# Patient Record
Sex: Female | Born: 1955 | Race: White | Hispanic: Yes | Smoking: Never smoker
Health system: Southern US, Community
[De-identification: ages and names within clinical notes are randomized; demographics above are authoritative.]

## PROBLEM LIST (undated history)

## (undated) ENCOUNTER — Emergency Department (HOSPITAL_COMMUNITY): Admission: EM | Payer: No Typology Code available for payment source | Source: Home / Self Care

## (undated) DIAGNOSIS — I8390 Asymptomatic varicose veins of unspecified lower extremity: Secondary | ICD-10-CM

## (undated) DIAGNOSIS — K829 Disease of gallbladder, unspecified: Secondary | ICD-10-CM

## (undated) DIAGNOSIS — C801 Malignant (primary) neoplasm, unspecified: Secondary | ICD-10-CM

## (undated) DIAGNOSIS — M2550 Pain in unspecified joint: Secondary | ICD-10-CM

## (undated) DIAGNOSIS — R1310 Dysphagia, unspecified: Secondary | ICD-10-CM

## (undated) DIAGNOSIS — I10 Essential (primary) hypertension: Secondary | ICD-10-CM

## (undated) DIAGNOSIS — K219 Gastro-esophageal reflux disease without esophagitis: Secondary | ICD-10-CM

## (undated) DIAGNOSIS — R002 Palpitations: Secondary | ICD-10-CM

## (undated) DIAGNOSIS — E739 Lactose intolerance, unspecified: Secondary | ICD-10-CM

## (undated) DIAGNOSIS — A630 Anogenital (venereal) warts: Secondary | ICD-10-CM

## (undated) DIAGNOSIS — J4520 Mild intermittent asthma, uncomplicated: Secondary | ICD-10-CM

## (undated) DIAGNOSIS — R12 Heartburn: Secondary | ICD-10-CM

## (undated) DIAGNOSIS — M7989 Other specified soft tissue disorders: Secondary | ICD-10-CM

## (undated) DIAGNOSIS — M419 Scoliosis, unspecified: Secondary | ICD-10-CM

## (undated) DIAGNOSIS — D649 Anemia, unspecified: Secondary | ICD-10-CM

## (undated) DIAGNOSIS — M1990 Unspecified osteoarthritis, unspecified site: Secondary | ICD-10-CM

## (undated) DIAGNOSIS — L509 Urticaria, unspecified: Secondary | ICD-10-CM

## (undated) DIAGNOSIS — M549 Dorsalgia, unspecified: Secondary | ICD-10-CM

## (undated) DIAGNOSIS — Z8543 Personal history of malignant neoplasm of ovary: Secondary | ICD-10-CM

## (undated) DIAGNOSIS — E785 Hyperlipidemia, unspecified: Secondary | ICD-10-CM

## (undated) DIAGNOSIS — Z8711 Personal history of peptic ulcer disease: Secondary | ICD-10-CM

## (undated) DIAGNOSIS — Z8619 Personal history of other infectious and parasitic diseases: Secondary | ICD-10-CM

## (undated) DIAGNOSIS — G709 Myoneural disorder, unspecified: Secondary | ICD-10-CM

## (undated) DIAGNOSIS — L309 Dermatitis, unspecified: Secondary | ICD-10-CM

## (undated) DIAGNOSIS — Z780 Asymptomatic menopausal state: Secondary | ICD-10-CM

## (undated) DIAGNOSIS — K56609 Unspecified intestinal obstruction, unspecified as to partial versus complete obstruction: Secondary | ICD-10-CM

## (undated) DIAGNOSIS — G8929 Other chronic pain: Secondary | ICD-10-CM

## (undated) DIAGNOSIS — K429 Umbilical hernia without obstruction or gangrene: Secondary | ICD-10-CM

## (undated) DIAGNOSIS — G35 Multiple sclerosis: Secondary | ICD-10-CM

## (undated) DIAGNOSIS — E559 Vitamin D deficiency, unspecified: Secondary | ICD-10-CM

## (undated) DIAGNOSIS — T783XXA Angioneurotic edema, initial encounter: Secondary | ICD-10-CM

## (undated) DIAGNOSIS — K4090 Unilateral inguinal hernia, without obstruction or gangrene, not specified as recurrent: Secondary | ICD-10-CM

## (undated) HISTORY — PX: LAPAROSCOPIC LYSIS OF ADHESIONS: SHX5905

## (undated) HISTORY — DX: Angioneurotic edema, initial encounter: T78.3XXA

## (undated) HISTORY — DX: Multiple sclerosis: G35

## (undated) HISTORY — DX: Dysphagia, unspecified: R13.10

## (undated) HISTORY — DX: Personal history of other infectious and parasitic diseases: Z86.19

## (undated) HISTORY — DX: Scoliosis, unspecified: M41.9

## (undated) HISTORY — DX: Asymptomatic menopausal state: Z78.0

## (undated) HISTORY — DX: Hyperlipidemia, unspecified: E78.5

## (undated) HISTORY — DX: Other specified soft tissue disorders: M79.89

## (undated) HISTORY — DX: Anemia, unspecified: D64.9

## (undated) HISTORY — DX: Other chronic pain: G89.29

## (undated) HISTORY — DX: Asymptomatic varicose veins of unspecified lower extremity: I83.90

## (undated) HISTORY — DX: Dermatitis, unspecified: L30.9

## (undated) HISTORY — DX: Vitamin D deficiency, unspecified: E55.9

## (undated) HISTORY — DX: Unspecified intestinal obstruction, unspecified as to partial versus complete obstruction: K56.609

## (undated) HISTORY — DX: Pain in unspecified joint: M25.50

## (undated) HISTORY — PX: HERNIA REPAIR: SHX51

## (undated) HISTORY — PX: TONSILLECTOMY: SUR1361

## (undated) HISTORY — DX: Palpitations: R00.2

## (undated) HISTORY — DX: Urticaria, unspecified: L50.9

## (undated) HISTORY — DX: Anogenital (venereal) warts: A63.0

## (undated) HISTORY — DX: Mild intermittent asthma, uncomplicated: J45.20

## (undated) HISTORY — DX: Personal history of peptic ulcer disease: Z87.11

## (undated) HISTORY — DX: Disease of gallbladder, unspecified: K82.9

## (undated) HISTORY — DX: Dorsalgia, unspecified: M54.9

## (undated) HISTORY — PX: SINOSCOPY: SHX187

## (undated) HISTORY — PX: VARICOSE VEIN SURGERY: SHX832

## (undated) HISTORY — DX: Personal history of malignant neoplasm of ovary: Z85.43

## (undated) HISTORY — PX: HYSTERECTOMY ABDOMINAL WITH SALPINGO-OOPHORECTOMY: SHX6792

## (undated) HISTORY — DX: Lactose intolerance, unspecified: E73.9

## (undated) HISTORY — DX: Unspecified osteoarthritis, unspecified site: M19.90

## (undated) HISTORY — PX: TYMPANOSTOMY TUBE PLACEMENT: SHX32

## (undated) HISTORY — PX: MOHS SURGERY: SUR867

## (undated) HISTORY — DX: Malignant (primary) neoplasm, unspecified: C80.1

## (undated) HISTORY — DX: Heartburn: R12

## (undated) HISTORY — PX: ADENOIDECTOMY: SUR15

## (undated) HISTORY — DX: Essential (primary) hypertension: I10

---

## 2006-01-05 HISTORY — PX: HYSTEROTOMY: SHX1776

## 2007-01-06 HISTORY — PX: CHOLECYSTECTOMY: SHX55

## 2007-10-06 DIAGNOSIS — B999 Unspecified infectious disease: Secondary | ICD-10-CM

## 2007-10-06 HISTORY — DX: Unspecified infectious disease: B99.9

## 2013-05-10 LAB — HM MAMMOGRAPHY

## 2017-05-11 DIAGNOSIS — K639 Disease of intestine, unspecified: Secondary | ICD-10-CM | POA: Insufficient documentation

## 2017-06-29 DIAGNOSIS — E785 Hyperlipidemia, unspecified: Secondary | ICD-10-CM

## 2017-06-29 DIAGNOSIS — C801 Malignant (primary) neoplasm, unspecified: Secondary | ICD-10-CM

## 2017-06-29 DIAGNOSIS — I10 Essential (primary) hypertension: Secondary | ICD-10-CM | POA: Insufficient documentation

## 2017-06-29 DIAGNOSIS — E663 Overweight: Secondary | ICD-10-CM | POA: Insufficient documentation

## 2017-06-29 DIAGNOSIS — I8390 Asymptomatic varicose veins of unspecified lower extremity: Secondary | ICD-10-CM

## 2017-06-29 DIAGNOSIS — M419 Scoliosis, unspecified: Secondary | ICD-10-CM

## 2017-06-29 DIAGNOSIS — G35 Multiple sclerosis: Secondary | ICD-10-CM | POA: Insufficient documentation

## 2017-06-29 DIAGNOSIS — Z8669 Personal history of other diseases of the nervous system and sense organs: Secondary | ICD-10-CM | POA: Insufficient documentation

## 2017-06-29 DIAGNOSIS — R918 Other nonspecific abnormal finding of lung field: Secondary | ICD-10-CM | POA: Insufficient documentation

## 2017-06-29 DIAGNOSIS — K56609 Unspecified intestinal obstruction, unspecified as to partial versus complete obstruction: Secondary | ICD-10-CM

## 2017-06-29 DIAGNOSIS — Z780 Asymptomatic menopausal state: Secondary | ICD-10-CM

## 2017-06-29 DIAGNOSIS — E559 Vitamin D deficiency, unspecified: Secondary | ICD-10-CM

## 2017-06-29 DIAGNOSIS — Z8619 Personal history of other infectious and parasitic diseases: Secondary | ICD-10-CM | POA: Insufficient documentation

## 2017-06-29 HISTORY — DX: Asymptomatic menopausal state: Z78.0

## 2017-06-29 HISTORY — DX: Asymptomatic varicose veins of unspecified lower extremity: I83.90

## 2017-06-29 HISTORY — DX: Unspecified intestinal obstruction, unspecified as to partial versus complete obstruction: K56.609

## 2017-06-29 HISTORY — DX: Malignant (primary) neoplasm, unspecified: C80.1

## 2017-06-29 HISTORY — DX: Hyperlipidemia, unspecified: E78.5

## 2017-06-29 HISTORY — DX: Multiple sclerosis: G35

## 2017-06-29 HISTORY — DX: Essential (primary) hypertension: I10

## 2017-06-29 HISTORY — DX: Scoliosis, unspecified: M41.9

## 2017-06-29 HISTORY — DX: Personal history of other infectious and parasitic diseases: Z86.19

## 2017-06-29 HISTORY — DX: Vitamin D deficiency, unspecified: E55.9

## 2017-09-22 ENCOUNTER — Ambulatory Visit: Payer: Self-pay | Admitting: Allergy & Immunology

## 2017-10-18 ENCOUNTER — Telehealth: Payer: Self-pay | Admitting: Allergy & Immunology

## 2017-11-02 ENCOUNTER — Ambulatory Visit (INDEPENDENT_AMBULATORY_CARE_PROVIDER_SITE_OTHER): Payer: No Typology Code available for payment source | Admitting: Allergy & Immunology

## 2017-11-02 ENCOUNTER — Encounter: Payer: Self-pay | Admitting: Allergy & Immunology

## 2017-11-02 VITALS — BP 118/72 | HR 70 | Temp 98.1°F | Resp 18 | Ht 64.5 in | Wt 176.8 lb

## 2017-11-02 DIAGNOSIS — J4521 Mild intermittent asthma with (acute) exacerbation: Secondary | ICD-10-CM | POA: Diagnosis not present

## 2017-11-02 DIAGNOSIS — J3089 Other allergic rhinitis: Secondary | ICD-10-CM

## 2017-11-02 DIAGNOSIS — J4520 Mild intermittent asthma, uncomplicated: Secondary | ICD-10-CM | POA: Insufficient documentation

## 2017-11-02 DIAGNOSIS — J302 Other seasonal allergic rhinitis: Secondary | ICD-10-CM | POA: Insufficient documentation

## 2017-11-02 HISTORY — DX: Mild intermittent asthma, uncomplicated: J45.20

## 2017-11-02 NOTE — Patient Instructions (Addendum)
1. Seasonal and perennial allergic rhinitis - We will get records from your previous doctor so that we can put all of the information together. - We will get some blood work to see what allergies are evident at this point to give [place] more information. - In the meantime, continue with your nasal saline rinses as needed.  - Consent for allergen immunotherapy provided (I anticipate that we would not have to start from the beginning since you have been on shots for a long period of time).  2. Intermittent asthma - Lung testing looked slightly low and you were wheezing on the right side. - It did not get better with the nebulizer treatment, unfortunately.  - I would recommend using albuterol four puffs every 4-6 hours for the next week or so while you are moving. - Start the prednisone pack if you are not feeling better by the weekend.   3. Return in about 3 months (around 02/02/2018).   Please inform us of any Emergency Department visits, hospitalizations, or changes in symptoms. Call us before going to the ED for breathing or allergy symptoms since we might be able to fit you in for a sick visit. Feel free to contact us anytime with any questions, problems, or concerns.  It was a pleasure to meet you today!  Websites that have reliable patient information: 1. American Academy of Asthma, Allergy, and Immunology: www.aaaai.org 2. Food Allergy Research and Education (FARE): foodallergy.org 3. Mothers of Asthmatics: http://www.asthmacommunitynetwork.org 4. American College of Allergy, Asthma, and Immunology: MonthlyElectricBill.co.uk   Make sure you are registered to vote! If you have moved or changed any of your contact information, you will need to get this updated before voting!

## 2017-11-02 NOTE — Progress Notes (Signed)
NEW PATIENT  Date of Service/Encounter:  11/02/17  Referring provider: Karlyn Agee PA   Assessment:   Mild intermittent asthma with acute exacerbation  Seasonal and perennial allergic rhinitis - on allergen immunotherapy from an ENT in [place]  Complicated past medical history, including multiple abdominal surgeries and multiple sclerosis  Plan/Recommendations:   1. Seasonal and perennial allergic rhinitis - We will get records from your previous doctor so that we can put all of the information together. - We will get some blood work to see what allergies are evident at this point to give [place] more information. - In the meantime, continue with your nasal saline rinses as needed.  - Consent for allergen immunotherapy provided (I anticipate that we would not have to start from the beginning since you have been on shots for a long period of time).  2. Intermittent asthma with acute exacerbation likely second to dust exposure during the move - Lung testing looked slightly low and you were wheezing on the right side. - It did not get better with the nebulizer treatment, unfortunately.  - I would recommend using albuterol four puffs every 4-6 hours for the next week or so while you are moving. - Start the prednisone pack if you are not feeling better by the weekend.   3. Return in about 3 months (around 02/02/2018).   Subjective:   Emily Wolfe is a 62 y.o. female presenting today for evaluation of  Chief Complaint  Patient presents with  . [place] has a history of the following: Patient Active Problem List   Diagnosis Date Noted  . Seasonal and perennial allergic rhinitis 11/02/2017  . Mild intermittent asthma without complication 26/37/8588  . History of disorder of central nervous system 06/29/2017  . Hyperlipidemia 06/29/2017  . Hypertension 06/29/2017  . Multiple sclerosis ([place]) 06/29/2017  . Scoliosis of lumbar spine  06/29/2017  . 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 Vitamin D deficiency 06/29/2017  . Colon abnormality 05/11/2017    History obtained from: chart review and patient.  Emily Wolfe was referred by Patient, No Pcp Per.     Emily Wolfe is a 62 y.o. female presenting for establishment of care. She was previously seen by Dr. Gonzella Lex (now Dr. Alain Marion apparently) at [facility]. She was initially tested a "while" ago. She has been doing shots for at least three years, every week at this point. She does not have her vials with her at this time since she was told that she needed something from our office. Her last injection was this past Thursday. She was positive to "pretty much everything". She was getting two injections. She knows that there were grasses, trees, and cats.   She has never been diagnosed with asthma. But she tells me that she has been worked up for asthma since her congestion continued despite all of her medications. She does have an albuterol inhaler "somewhere" but it seems that it was lost in the move.   She is currently has nasal saline to use as needed. She "has so many other illnesses" that this just falls off. She has had 12 abdominal surgeries to adhesions and internal hernias and whatnot. She also has MS and is seen at [facility]. She has a history of skin cancer and had it removed from the left side of her nose.   Otherwise, there is no history of other atopic diseases, including food allergies, drug allergies, stinging insect allergies or  urticaria. There is no significant infectious history.Vaccinations are up to date.    Past Medical History: Patient Active Problem List   Diagnosis Date Noted  . Seasonal and perennial allergic rhinitis 11/02/2017  . Mild intermittent asthma without complication 23/53/6144  . History of disorder of central nervous system 06/29/2017  . Hyperlipidemia 06/29/2017  . Hypertension 06/29/2017  . Multiple  sclerosis ([place]) 06/29/2017  . Scoliosis of lumbar spine 06/29/2017  . 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 Vitamin D deficiency 06/29/2017  . Colon abnormality 05/11/2017    Medication List:  Allergies as of 11/02/2017   Not on File     Medication List        Accurate as of 11/02/17 11:34 PM. Always use your most recent med list.          GABAPENTIN (ONCE-DAILY) PO Take 1 tablet by mouth 3 (three) times daily.   GILENYA PO Take 1 tablet by mouth daily.   losartan 25 MG tablet Commonly known as:  COZAAR Take 25 mg by mouth daily.       Birth History: non-contributory  Developmental History: non-contributory.   Past Surgical History: History reviewed. No pertinent surgical history.   Family History: History reviewed. No pertinent family history.   Social History: Emily Wolfe lives at home in a house that is 62 years old. There is vinyl flooring throughout the home. She has gas heating and central cooling. There are dogs inside of the home. There are dust mite coverings on the bedding. There is no tobacco exposure in the home.    Review of Systems: a 14-point review of systems is pertinent for what is mentioned in HPI.  Otherwise, all other systems were negative. Constitutional: negative other than that listed in the HPI Eyes: negative other than that listed in the HPI Ears, nose, mouth, throat, and face: negative other than that listed in the HPI Respiratory: negative other than that listed in the HPI Cardiovascular: negative other than that listed in the HPI Gastrointestinal: negative other than that listed in the HPI Genitourinary: negative other than that listed in the HPI Integument: negative other than that listed in the HPI Hematologic: negative other than that listed in the HPI Musculoskeletal: negative other than that listed in the HPI Neurological: negative other than that listed in the HPI Allergy/Immunologic: negative other than that listed  in the HPI    Objective:   Blood pressure 118/72, pulse 70, temperature 98.1 F (36.7 C), temperature source Oral, resp. rate 18, height 5' 4.5" (1.638 m), weight 176 lb 12.8 oz (80.2 kg), SpO2 97 %. Body mass index is 29.88 kg/m.   Physical Exam:  General: Alert, interactive, in no acute distress. Somewhat dejected in nature.  Eyes: No conjunctival injection bilaterally, no discharge on the right, no discharge on the left and no Horner-Trantas dots present. PERRL bilaterally. EOMI without pain. No photophobia.  Ears: Right TM pearly gray with normal light reflex, Left TM pearly gray with normal light reflex, Right TM intact without perforation and Left TM intact without perforation.  Nose/Throat: External nose within normal limits and septum midline. Turbinates edematous and pale with clear discharge. Posterior oropharynx mildly erythematous without cobblestoning in the posterior oropharynx. Tonsils 2+ without exudates.  Tongue without thrush. Neck: Supple without thyromegaly. Trachea midline. Adenopathy: no enlarged lymph nodes appreciated in the anterior cervical, occipital, axillary, epitrochlear, inguinal, or popliteal regions. Lungs: Clear to auscultation without wheezing, rhonchi or rales. No increased work of breathing. CV: Normal S1/S2.  No murmurs. Capillary refill <2 seconds.  Abdomen: Nondistended, nontender. No guarding or rebound tenderness. Bowel sounds present in all fields and hyperactive  Skin: Warm and dry, without lesions or rashes. Extremities:  No clubbing, cyanosis or edema. Neuro:   Grossly intact. No focal deficits appreciated. Responsive to questions.  Diagnostic studies:   Spirometry: results abnormal (FEV1: 1.66/64%, FVC: 2.27/67%, FEV1/FVC: 73%).    Spirometry consistent with possible restrictive disease. Albuterol nebulizer treatment given in clinic with no improvement. Overall there was poor effort.   Allergy Studies: none (obtained blood work  instead)       Salvatore Marvel, MD [facility] and [place] [facility] [place]

## 2017-11-05 LAB — IGE+ALLERGENS ZONE 2(30)
Alternaria Alternata IgE: <0.1 kU/L
Amer Sycamore IgE Qn: <0.1 kU/L
Bahia Grass IgE: <0.1 kU/L
Bermuda Grass IgE: <0.1 kU/L
Cedar, Mountain IgE: <0.1 kU/L
Cladosporium Herbarum IgE: <0.1 kU/L
Cockroach, American IgE: <0.1 kU/L
Common Silver Birch IgE: <0.1 kU/L
D Farinae IgE: <0.1 kU/L
D Pteronyssinus IgE: <0.1 kU/L
Elm, American IgE: <0.1 kU/L
IgE (Immunoglobulin E), Serum: 5 IU/mL — ABNORMAL LOW (ref 6–495)
Johnson Grass IgE: <0.1 kU/L
Mugwort IgE Qn: <0.1 kU/L
Nettle IgE: <0.1 kU/L
Oak, White IgE: <0.1 kU/L
Plantain, English IgE: <0.1 kU/L
Ragweed, Short IgE: <0.1 kU/L
Stemphylium Herbarum IgE: <0.1 kU/L
Timothy Grass IgE: <0.1 kU/L

## 2017-11-10 ENCOUNTER — Telehealth: Payer: Self-pay | Admitting: Allergy & Immunology

## 2017-11-10 NOTE — Telephone Encounter (Signed)
Pt called and wanted know if we have heard from her dr about her vials. [phone]/

## 2017-11-10 NOTE — Telephone Encounter (Signed)
Dr. Ernst Bowler do you know if you have received any information pertaining to her vials?

## 2017-11-10 NOTE — Telephone Encounter (Signed)
I have not seen anything come across my desk. Can you check on inbox in my office?   Salvatore Marvel, MD All[facility] and [place] [facility] [place]

## 2017-11-10 NOTE — Telephone Encounter (Signed)
Called the office at [facility] and they stated they never received the Medical Records release form. Form has been faxed again to the office. Will call tomorrow to follow up with the office to ensure that they received the form. Dr. Ernst Bowler is aware.

## 2017-11-11 NOTE — Telephone Encounter (Signed)
Called patient and left a detailed message explaining that we are waiting for the physician to sign off on the medical release form.

## 2017-11-11 NOTE — Telephone Encounter (Signed)
Called and spoke with the office and they confirmed that they did receive the medical release form, they are just waiting on the physician to sign the orders to release the medical information.

## 2017-11-11 NOTE — Telephone Encounter (Signed)
Patient called and informed that she was going to that office today to receive her allergy injection and asked if it was ok if she requested to pick up her vials and bring them to our office so that she can continue her injections with [place]. Informed patient that as long as the office will let her take her vials out she can certainly bring them to our office.

## 2017-11-17 NOTE — Telephone Encounter (Signed)
Heather have you seen these yet(vials)?

## 2017-11-17 NOTE — Telephone Encounter (Signed)
Patient dropped vials off last week. They are in the shot room. Paperwork filed in the out of office vial book in the injection room.

## 2017-11-19 DIAGNOSIS — A630 Anogenital (venereal) warts: Secondary | ICD-10-CM

## 2017-11-19 DIAGNOSIS — Z8543 Personal history of malignant neoplasm of ovary: Secondary | ICD-10-CM

## 2017-11-19 HISTORY — DX: Personal history of malignant neoplasm of ovary: Z85.43

## 2017-11-19 HISTORY — DX: Anogenital (venereal) warts: A63.0

## 2017-11-19 LAB — HM PAP SMEAR: HM Pap smear: NEGATIVE

## 2017-11-19 NOTE — Progress Notes (Addendum)
We received the outside records for Emily Wolfe previous practice. It was an ENT practice and it looks like they performed skin endpoint titration testing, which unfortunately has terrible predictive value. Therefore I cannot use those results to make new vials in good faith. I believe we did blood testing for her which was negative. We need to bring her in for percutaneous skin testing to get some valid results.   In the meantime, she can continue to receive the injections of the vials that we have, unless she would rather just stop. We will have to restart our new vials (based on her skin testing) from the beginning, although she can go on a faster course (schedule B).   I did call her this morning to discuss this. She understands that she will need repeat skin testing. She scheduled with me in [place] on November 22nd at 1:30pm for skin testing only. Vance Gather put her into the appointment slot.   Salvatore Marvel, MD All[facility] and [place] [facility] [place]

## 2017-11-26 ENCOUNTER — Encounter: Payer: Self-pay | Admitting: Allergy & Immunology

## 2017-11-26 ENCOUNTER — Ambulatory Visit: Payer: No Typology Code available for payment source | Admitting: Allergy & Immunology

## 2017-11-26 VITALS — BP 138/76 | HR 70 | Temp 98.7°F | Resp 18

## 2017-11-26 DIAGNOSIS — J3089 Other allergic rhinitis: Secondary | ICD-10-CM | POA: Diagnosis not present

## 2017-11-26 DIAGNOSIS — J302 Other seasonal allergic rhinitis: Secondary | ICD-10-CM | POA: Diagnosis not present

## 2017-11-26 NOTE — Progress Notes (Signed)
FOLLOW UP  Date of Service/Encounter:  11/26/17   Assessment:   Seasonal and perennial allergic rhinitis (ragweed, grasses, indoor molds and outdoor molds)  Plan/Recommendations:   1. Seasonal and perennial allergic rhinitis - Testing today showed: ragweed, grasses, indoor molds and outdoor molds - Copy of test results provided.  - Avoidance measures provided. - Continue with: nasal saline rinses 1-2 times daily  - Allergy shot consent signed at the last visit.  - Make an appointment in two weeks to start allergy shots.   2. Shortness of breath - improved - We did not do lung testing today. - I am glad that your symptoms have improved since the last visit. - Good luck with all of the unpacking and putting away of items!   3. Return in about 3 months (around 02/26/2018).  Subjective:   Emily Wolfe is a 62 y.o. female presenting today for follow up of  Chief Complaint  Patient presents with  . Allergy Testing    Emily Wolfe has a history of the following: Patient Active Problem List   Diagnosis Date Noted  . Seasonal and perennial allergic rhinitis 11/02/2017  . Mild intermittent asthma without complication 72/62/0355  . History of disorder of central nervous system 06/29/2017  . Hyperlipidemia 06/29/2017  . Hypertension 06/29/2017  . Multiple sclerosis ([place]) 06/29/2017  . Scoliosis of lumbar spine 06/29/2017  . 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 Vitamin D deficiency 06/29/2017  . Colon abnormality 05/11/2017    History obtained from: chart review and patient.  [place] Primary Care Provider is Patient, No Pcp Per.     Emily Wolfe is a 62 y.o. female presenting for a skin testing. She was last seen at the end of last month for her first appointment. At that time, she was establishing care with [place] following her move from [place], [place]. She was interested in starting her allergen immunotherapy at the time and we reviewed her outside  records. It turns out that she underwent end dilution testing to determine her allergies, which is not an approved mode of determination of allergies. We did send blood testing which was completely negative.   Since the last visit, she has done well. She does feel that her allergies are out of control since being off of the allergy shots. She is very much looking forward to getting started with [place].  Otherwise, there have been no changes to her past medical history, surgical history, family history, or social history.    Review of Systems: a 14-point review of systems is pertinent for what is mentioned in HPI.  Otherwise, all other systems were negative.  Constitutional: negative other than that listed in the HPI Eyes: negative other than that listed in the HPI Ears, nose, mouth, throat, and face: negative other than that listed in the HPI Respiratory: negative other than that listed in the HPI Cardiovascular: negative other than that listed in the HPI Gastrointestinal: negative other than that listed in the HPI Genitourinary: negative other than that listed in the HPI Integument: negative other than that listed in the HPI Hematologic: negative other than that listed in the HPI Musculoskeletal: negative other than that listed in the HPI Neurological: negative other than that listed in the HPI Allergy/Immunologic: negative other than that listed in the HPI    Objective:   Blood pressure 138/76, pulse 70, temperature 98.7 F (37.1 C), temperature source Oral, resp. rate 18. There is no height or weight on file to calculate BMI.  Physical Exam: deferred since this was skin testing appointment only   Diagnostic studies:    Allergy Studies:   Airborne Adult Perc - 2017/12/20 1421    Time Antigen Placed  0200    Allergen Manufacturer  Lavella Hammock    Location  Back    Number of Test  59    Panel 1  Select    1. Control-Buffer 50% Glycerol  Negative    2. Control-Histamine 1 mg/ml  2+     3. Albumin saline  Negative    4. [place]  Negative    5. [place]  Negative    6. Johnson  Negative    7. [place] Blue  Negative    8. Meadow Fescue  Negative    9. Perennial Rye  Negative    10. Sweet Vernal  Negative    11. Timothy  Negative    12. Cocklebur  Negative    13. Burweed Marshelder  Negative    14. Ragweed, short  4+    15. Ragweed, Giant  Negative    16. Plantain,  English  Negative    17. Lamb's Quarters  Negative    18. Sheep Sorrell  Negative    19. Rough Pigweed  Negative    20. Marsh Elder, Rough  Negative    21. Mugwort, Common  Negative    22. Ash mix  Negative    23. Birch mix  Negative    24. Beech American  Negative    25. Box, Elder  Negative    26. Cedar, red  Negative    27. Cottonwood, [place]  Negative    28. Elm mix  Negative    29. Hickory mix  Negative    30. Maple mix  Negative    31. Oak, [place] mix  Negative    32. Pecan Pollen  Negative    33. Pine mix  Negative    34. Sycamore Eastern  Negative    35. [place], Black Pollen  Negative    36. Alternaria alternata  Negative    37. Cladosporium Herbarum  Negative    38. Aspergillus mix  Negative    39. Penicillium mix  Negative    40. Bipolaris sorokiniana (Helminthosporium)  Negative    41. Drechslera spicifera (Curvularia)  Negative    42. Mucor plumbeus  Negative    43. Fusarium moniliforme  Negative    44. Aureobasidium pullulans (pullulara)  Negative    45. Rhizopus oryzae  Negative    46. Botrytis cinera  Negative    47. Epicoccum nigrum  Negative    48. Phoma betae  Negative    49. Candida Albicans  Negative    50. Trichophyton mentagrophytes  Negative    51. Mite, D Farinae  5,000 AU/ml  Negative    52. Mite, D Pteronyssinus  5,000 AU/ml  Negative    53. Cat Hair 10,000 BAU/ml  Negative    54.  Dog Epithelia  Negative    55. Mixed Feathers  Negative    56. Horse Epithelia  Negative    57. Cockroach, German  Negative    58. Mouse  Negative    59. Tobacco Leaf  Negative      Intradermal - 2017-12-20 1445    Time Antigen Placed  0240    Allergen Manufacturer  Lavella Hammock    Location  Arm    Number of Test  14    Intradermal  Select    Control  Negative  Guatemala  Negative    Johnson  Negative    7 Grass  1+    Weed mix  Negative    Tree mix  Negative    Mold 1  Negative    Mold 2  2+    Mold 3  1+    Mold 4  Negative    Cat  Negative    Dog  Negative    Cockroach  Negative    Mite mix  Negative        Allergy testing results were read and interpreted by myself, documented by clinical staff.      Salvatore Marvel, MD  [facility] and [place] [facility] [place]

## 2017-11-26 NOTE — Patient Instructions (Addendum)
1. Seasonal and perennial allergic rhinitis - Testing today showed: ragweed, grasses, indoor molds and outdoor molds - Copy of test results provided.  - Avoidance measures provided. - Continue with: nasal saline rinses 1-2 times daily  - Allergy shot consent signed at the last visit.  - Make an appointment in two weeks to start allergy shots.   2. Shortness of breath - improved - We did not do lung testing today. - I am glad that your symptoms have improved since the last visit. - Good luck with all of the unpacking and putting away of items!   3. Return in about 3 months (around 02/26/2018).   Please inform us of any Emergency Department visits, hospitalizations, or changes in symptoms. Call us before going to the ED for breathing or allergy symptoms since we might be able to fit you in for a sick visit. Feel free to contact us anytime with any questions, problems, or concerns.  It was a pleasure to see you again today!  Websites that have reliable patient information: 1. American Academy of Asthma, Allergy, and Immunology: www.aaaai.org 2. Food Allergy Research and Education (FARE): foodallergy.org 3. Mothers of Asthmatics: http://www.asthmacommunitynetwork.org 4. American College of Allergy, Asthma, and Immunology: MonthlyElectricBill.co.uk   Make sure you are registered to vote! If you have moved or changed any of your contact information, you will need to get this updated before voting!    Reducing Pollen Exposure  The American Academy of Allergy, Asthma and Immunology suggests the following steps to reduce your exposure to pollen during allergy seasons.    1. Do not hang sheets or clothing out to dry; pollen may collect on these items. 2. Do not mow lawns or spend time around freshly cut grass; mowing stirs up pollen. 3. Keep windows closed at night.  Keep car windows closed while driving. 4. Minimize morning activities outdoors, a time when pollen counts are usually at their  highest. 5. Stay indoors as much as possible when pollen counts or humidity is high and on windy days when pollen tends to remain in the air longer. 6. Use air conditioning when possible.  Many air conditioners have filters that trap the pollen spores. 7. Use a HEPA room air filter to remove pollen form the indoor air you breathe.  Control of Mold Allergen   Mold and fungi can grow on a variety of surfaces provided certain temperature and moisture conditions exist.  Outdoor molds grow on plants, decaying vegetation and soil.  The major outdoor mold, Alternaria and Cladosporium, are found in very high numbers during hot and dry conditions.  Generally, a late Summer - Fall peak is seen for common outdoor fungal spores.  Rain will temporarily lower outdoor mold spore count, but counts rise rapidly when the rainy period ends.  The most important indoor molds are Aspergillus and Penicillium.  Dark, humid and poorly ventilated basements are ideal sites for mold growth.  The next most common sites of mold growth are the bathroom and the kitchen.  Outdoor (Seasonal) Mold Control  Positive outdoor molds via skin testing: Bipolaris (Helminthsporium), Drechslera (Curvalaria) and Mucor  1. Use air conditioning and keep windows closed 2. Avoid exposure to decaying vegetation. 3. Avoid leaf raking. 4. Avoid grain handling. 5. Consider wearing a face mask if working in moldy areas.  6.   Indoor (Perennial) Mold Control   Positive indoor molds via skin testing: Aspergillus and Penicillium  1. Maintain humidity below 50%. 2. Clean washable surfaces with 5% bleach  solution. 3. Remove sources e.g. contaminated carpets.    Allergy Shots   Allergies are the result of a chain reaction that starts in the immune system. Your immune system controls how your body defends itself. For instance, if you have an allergy to pollen, your immune system identifies pollen as an invader or allergen. Your immune system  overreacts by producing antibodies called Immunoglobulin E (IgE). These antibodies travel to cells that release chemicals, causing an allergic reaction.  The concept behind allergy immunotherapy, whether it is received in the form of shots or tablets, is that the immune system can be desensitized to specific allergens that trigger allergy symptoms. Although it requires time and patience, the payback can be long-term relief.  How Do Allergy Shots Work?  Allergy shots work much like a vaccine. Your body responds to injected amounts of a particular allergen given in increasing doses, eventually developing a resistance and tolerance to it. Allergy shots can lead to decreased, minimal or no allergy symptoms.  There generally are two phases: build-up and maintenance. Build-up often ranges from three to six months and involves receiving injections with increasing amounts of the allergens. The shots are typically given once or twice a week, though more rapid build-up schedules are sometimes used.  The maintenance phase begins when the most effective dose is reached. This dose is different for each person, depending on how allergic you are and your response to the build-up injections. Once the maintenance dose is reached, there are longer periods between injections, typically two to four weeks.  Occasionally doctors give cortisone-type shots that can temporarily reduce allergy symptoms. These types of shots are different and should not be confused with allergy immunotherapy shots.  Who Can Be Treated with Allergy Shots?  Allergy shots may be a good treatment approach for people with allergic rhinitis (hay fever), allergic asthma, conjunctivitis (eye allergy) or stinging insect allergy.   Before deciding to begin allergy shots, you should consider:  . The length of allergy season and the severity of your symptoms . Whether medications and/or changes to your environment can control your symptoms . Your  desire to avoid long-term medication use . Time: allergy immunotherapy requires a major time commitment . Cost: may vary depending on your insurance coverage  Allergy shots for children age 42 and older are effective and often well tolerated. They might prevent the onset of new allergen sensitivities or the progression to asthma.  Allergy shots are not started on patients who are pregnant but can be continued on patients who become pregnant while receiving them. In some patients with other medical conditions or who take certain common medications, allergy shots may be of risk. It is important to mention other medications you talk to your allergist.   When Will I Feel Better?  Some may experience decreased allergy symptoms during the build-up phase. For others, it may take as long as 12 months on the maintenance dose. If there is no improvement after a year of maintenance, your allergist will discuss other treatment options with you.  If you aren't responding to allergy shots, it may be because there is not enough dose of the allergen in your vaccine or there are missing allergens that were not identified during your allergy testing. Other reasons could be that there are high levels of the allergen in your environment or major exposure to non-allergic triggers like tobacco smoke.  What Is the Length of Treatment?  Once the maintenance dose is reached, allergy shots are generally  continued for three to five years. The decision to stop should be discussed with your allergist at that time. Some people may experience a permanent reduction of allergy symptoms. Others may relapse and a longer course of allergy shots can be considered.  What Are the Possible Reactions?  The two types of adverse reactions that can occur with allergy shots are local and systemic. Common local reactions include very mild redness and swelling at the injection site, which can happen immediately or several hours after. A  systemic reaction, which is less common, affects the entire body or a particular body system. They are usually mild and typically respond quickly to medications. Signs include increased allergy symptoms such as sneezing, a stuffy nose or hives.  Rarely, a serious systemic reaction called anaphylaxis can develop. Symptoms include swelling in the throat, wheezing, a feeling of tightness in the chest, nausea or dizziness. Most serious systemic reactions develop within 30 minutes of allergy shots. This is why it is strongly recommended you wait in your doctor's office for 30 minutes after your injections. Your allergist is trained to watch for reactions, and his or her staff is trained and equipped with the proper medications to identify and treat them.  Who Should Administer Allergy Shots?  The preferred location for receiving shots is your prescribing allergist's office. Injections can sometimes be given at another facility where the physician and staff are trained to recognize and treat reactions, and have received instructions by your prescribing allergist.

## 2017-11-27 ENCOUNTER — Encounter: Payer: Self-pay | Admitting: Allergy & Immunology

## 2017-12-06 DIAGNOSIS — J301 Allergic rhinitis due to pollen: Secondary | ICD-10-CM

## 2017-12-06 NOTE — Progress Notes (Signed)
VIALS EXP 12-07-18

## 2017-12-07 DIAGNOSIS — J3089 Other allergic rhinitis: Secondary | ICD-10-CM | POA: Diagnosis not present

## 2017-12-10 ENCOUNTER — Ambulatory Visit: Payer: No Typology Code available for payment source

## 2017-12-15 ENCOUNTER — Encounter: Payer: Self-pay | Admitting: Family Medicine

## 2017-12-15 ENCOUNTER — Ambulatory Visit: Payer: No Typology Code available for payment source | Admitting: Family Medicine

## 2017-12-15 VITALS — BP 126/74 | HR 68 | Temp 97.6°F | Ht 65.5 in | Wt 175.0 lb

## 2017-12-15 DIAGNOSIS — K639 Disease of intestine, unspecified: Secondary | ICD-10-CM

## 2017-12-15 DIAGNOSIS — Z90710 Acquired absence of both cervix and uterus: Secondary | ICD-10-CM

## 2017-12-15 DIAGNOSIS — Z8543 Personal history of malignant neoplasm of ovary: Secondary | ICD-10-CM

## 2017-12-15 DIAGNOSIS — G35 Multiple sclerosis: Secondary | ICD-10-CM

## 2017-12-15 DIAGNOSIS — E7800 Pure hypercholesterolemia, unspecified: Secondary | ICD-10-CM

## 2017-12-15 DIAGNOSIS — E663 Overweight: Secondary | ICD-10-CM

## 2017-12-15 DIAGNOSIS — Z8619 Personal history of other infectious and parasitic diseases: Secondary | ICD-10-CM | POA: Diagnosis not present

## 2017-12-15 DIAGNOSIS — J4520 Mild intermittent asthma, uncomplicated: Secondary | ICD-10-CM

## 2017-12-15 DIAGNOSIS — I10 Essential (primary) hypertension: Secondary | ICD-10-CM | POA: Diagnosis not present

## 2017-12-15 DIAGNOSIS — J302 Other seasonal allergic rhinitis: Secondary | ICD-10-CM

## 2017-12-15 DIAGNOSIS — J3089 Other allergic rhinitis: Secondary | ICD-10-CM

## 2017-12-15 NOTE — Progress Notes (Signed)
Emily Wolfe is a 62 y.o. female is here to [place].   Patient Care Team: Briscoe Deutscher, DO as PCP - General (Family Medicine) Dorien Chihuahua, MD as Referring Physician (Obstetrics and Gynecology) Valentina Shaggy, MD as Consulting Physician (Allergy and Immunology) Janece Canterbury, MD as Referring Physician (Gynecology)   History of Present Illness:   Emily Wolfe, CMA acting as scribe for Dr. Briscoe Deutscher.   HPI: She has recently retired from [place] at [employer]. She is new to area and establishing care. Long history, with several recent interventions reviewed together today. See Assessment and Plan section for Problem Based Charting of issues discussed today.   Health Maintenance Due  Topic Date Due  . Hepatitis C Screening  [birth year]  . HIV Screening  08/17/1970  . PAP SMEAR-Modifier  08/16/1976  . MAMMOGRAM  08/16/2005  . COLONOSCOPY  08/16/2005  . TETANUS/TDAP  02/05/2017   Depression screen PHQ 2/9 12/15/2017  Decreased Interest 0  Down, Depressed, Hopeless 1  PHQ - 2 Score 1  Altered sleeping 1  Tired, decreased energy 3  Change in appetite 3  Feeling bad or failure about yourself  0  Trouble concentrating 0  Moving slowly or fidgety/restless 0  Suicidal thoughts 0  PHQ-9 Score 8  Difficult doing work/chores Somewhat difficult   PMHx, SurgHx, SocialHx, Medications, and Allergies were reviewed in the Visit Navigator and updated as appropriate.   Past Medical History:  Diagnosis Date  . Angio-edema   . Eczema   . Genital warts 11/19/2017  . 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 Ouida Sills in [place].  Marland Kitchen History of ovarian cancer 11/19/2017  . Hyperlipidemia 06/29/2017  . Hypertension 06/29/2017  . Malignant neoplastic disease ([place]) 06/29/2017  . Mild intermittent asthma without complication 03/50/0938  . Multiple sclerosis ([place]) 06/29/2017   Followed by Dr. Gerald Leitz, seen twice a year, taking  Fingolimod daily.   Marland Kitchen Postmenopausal 06/29/2017  . Scoliosis of lumbar spine 06/29/2017  . Small bowel obstruction ([place]) 06/29/2017  . Urticaria   . 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 Vitamin D deficiency 06/29/2017   Past Surgical History:  Procedure Laterality Date  . ADENOIDECTOMY    . CHOLECYSTECTOMY  2009  . HERNIA REPAIR     x 2, 4 months apart in 2019  . HYSTEROTOMY  2008  . SINOSCOPY    . TONSILLECTOMY    . TYMPANOSTOMY TUBE PLACEMENT      Family History  Problem Relation Age of Onset  . Breast cancer Mother   . Cervical cancer Maternal Grandmother     Social History   Tobacco Use  . Smoking status: Never Smoker  . Smokeless tobacco: Never Used  Substance Use Topics  . Alcohol use: Not Currently  . Drug use: Not Currently    Current Medications and Allergies:   Current Outpatient Medications:  .  Fingolimod HCl (GILENYA PO), Take 1 tablet by mouth daily., Disp: , Rfl:  .  GABAPENTIN, ONCE-DAILY, PO, Take 1 tablet by mouth 3 (three) times daily., Disp: , Rfl:  .  losartan (COZAAR) 25 MG tablet, Take 25 mg by mouth daily., Disp: , Rfl:   No Known Allergies   Review of Systems:   Pertinent items are noted in the HPI. Otherwise, a complete ROS is negative.  Vitals:   Vitals:   12/15/17 1310  BP: 126/74  Pulse: 68  Temp: 97.6 F (36.4 C)  TempSrc:  Oral  SpO2: 98%  Weight: 175 lb (79.4 kg)  Height: 5' 5.5" (1.664 m)     Body mass index is 28.68 kg/m.  Physical Exam:   Physical Exam Vitals signs and nursing note reviewed.  HENT:     Head: Normocephalic and atraumatic.  Eyes:     Pupils: Pupils are equal, round, and reactive to light.  Neck:     Musculoskeletal: Normal range of motion and neck supple.  Cardiovascular:     Rate and Rhythm: Normal rate and regular rhythm.     Heart sounds: Normal heart sounds.  Pulmonary:     Effort: Pulmonary effort is normal.  Abdominal:     Palpations: Abdomen is soft.  Skin:    General: Skin  is warm.  Psychiatric:        Behavior: Behavior normal.    Assessment and Plan:   Hyperlipidemia History: Previously on Pravastatin. Trying to exercise on a regular basis? Yes. Compliant with diet? Yes.    Assessment/Plan: Dyslipidemia under fair control. Continue dietary measures. Continue regular exercise, an average 40 minutes of moderate to vigorous-intensity aerobic activity 3 or 4 times per week. Lipid-lowering medications: will hold for now.   History of ovarian cancer She has a h/o ovarian cancer, first BSO, then hysterectomy a week later. She did receive chemotherapy. She does not recall what type of cancer she had. Chemo was delayed due to postoperative infection. Performed Dr. Pryor Ochoa in [place]. Followed by GYN in [place] now as well.   Overweight (BMI 25.0-29.9) The patient is asked to make an attempt to improve diet and exercise patterns to aid in medical management of this problem.   Multiple sclerosis ([place]), followed by Dr. Gerald Leitz, seen twice yearly, on Fingolimod Well controlled.  No signs of complications, medication side effects, or red flags.  Continue current regimen.    Colon abnormality She underwent laparotomy due to chronic abdominal pain and recurrent SBOs. Interventions included extensive lysis of adhesions, small bowel resection, repair of recurrent incisional hernia. Pain is much improved since that time.   Mild intermittent asthma without complication Patient requests referral to Pulmonology.  Hypertension History: Review: taking medications as instructed, no medication side effects noted, no TIAs, no chest pain on exertion, no dyspnea on exertion, no swelling of ankles. Smoker: No.  BP Readings from Last 3 Encounters:  12/15/17 126/74  11/26/17 138/76  11/02/17 118/72   No results found for: CREATININE   Assessment/Plan: 1. Medication: no change. 2. Dietary sodium restriction. 3. Regular aerobic exercise. 4. Check blood pressures monthly  and record.  Orders Placed This Encounter  Procedures  . Ambulatory referral to Pulmonology  . Ambulatory referral to Ophthalmology   . Reviewed expectations re: course of current medical issues. . Discussed self-management of symptoms. . Outlined signs and symptoms indicating need for more acute intervention. . Patient verbalized understanding and all questions were answered. Marland Kitchen Health Maintenance issues including appropriate healthy diet, exercise, and smoking avoidance were discussed with patient. . See orders for this visit as documented in the electronic medical record. . Patient received an After Visit Summary.  Briscoe Deutscher, DO [place] [place] 12/26/2017  CMA served as Education administrator during this visit. History, Physical, and Plan performed by medical provider. The above documentation has been reviewed and is accurate and complete. Briscoe Deutscher, D.O.  Records requested if needed. Time spent with the patient: 45 minutes, of which >50% was spent in obtaining information about her symptoms, reviewing her previous labs, evaluations,  and treatments, counseling her about her condition (please see the discussed topics above), and developing a plan to further investigate it; she had a number of questions which I addressed.

## 2017-12-26 ENCOUNTER — Encounter: Payer: Self-pay | Admitting: Family Medicine

## 2017-12-26 DIAGNOSIS — Z90710 Acquired absence of both cervix and uterus: Secondary | ICD-10-CM | POA: Insufficient documentation

## 2017-12-26 NOTE — Assessment & Plan Note (Signed)
History: Previously on Pravastatin. Trying to exercise on a regular basis? Yes. Compliant with diet? Yes.    Assessment/Plan: Dyslipidemia under fair control. Continue dietary measures. Continue regular exercise, an average 40 minutes of moderate to vigorous-intensity aerobic activity 3 or 4 times per week. Lipid-lowering medications: will hold for now.

## 2017-12-26 NOTE — Assessment & Plan Note (Signed)
The patient is asked to make an attempt to improve diet and exercise patterns to aid in medical management of this problem.  

## 2017-12-26 NOTE — Assessment & Plan Note (Signed)
Well controlled.  No signs of complications, medication side effects, or red flags.  Continue current regimen.   

## 2017-12-26 NOTE — Assessment & Plan Note (Signed)
Patient requests referral to Pulmonology.

## 2017-12-26 NOTE — Assessment & Plan Note (Signed)
She underwent laparotomy due to chronic abdominal pain and recurrent SBOs. Interventions included extensive lysis of adhesions, small bowel resection, repair of recurrent incisional hernia. Pain is much improved since that time.

## 2017-12-26 NOTE — Assessment & Plan Note (Signed)
She has a h/o ovarian cancer, first BSO, then hysterectomy a week later. She did receive chemotherapy. She does not recall what type of cancer she had. Chemo was delayed due to postoperative infection. Performed Dr. Pryor Ochoa in [place]. Followed by GYN in [place] now as well.

## 2017-12-26 NOTE — Assessment & Plan Note (Signed)
History: Review: taking medications as instructed, no medication side effects noted, no TIAs, no chest pain on exertion, no dyspnea on exertion, no swelling of ankles. Smoker: No.  BP Readings from Last 3 Encounters:  12/15/17 126/74  11/26/17 138/76  11/02/17 118/72   No results found for: CREATININE   Assessment/Plan: 1. Medication: no change. 2. Dietary sodium restriction. 3. Regular aerobic exercise. 4. Check blood pressures monthly and record.

## 2018-01-03 ENCOUNTER — Ambulatory Visit: Payer: No Typology Code available for payment source | Admitting: Internal Medicine

## 2018-01-03 ENCOUNTER — Encounter: Payer: Self-pay | Admitting: Internal Medicine

## 2018-01-03 VITALS — BP 140/78 | HR 66 | Ht 66.0 in | Wt 174.0 lb

## 2018-01-03 DIAGNOSIS — J45991 Cough variant asthma: Secondary | ICD-10-CM | POA: Diagnosis not present

## 2018-01-03 LAB — NITRIC OXIDE: Nitric Oxide: 14

## 2018-01-03 NOTE — Progress Notes (Addendum)
Emily Wolfe, female    DOB: 03-15-1955,     MRN: [id number]   Brief patient profile:  59 yowf never smoker  Noted of chronic nasal drainage mostly  on L side x decades assoc with L molar problems around 2015 and cough since then so eval by Ernst Bowler on 11/26/17 with reactions ragweed, grasses, indoor molds and outdoor molds and plan to start shots and in meantime referred to pulmonary clinic 01/03/2018 by Dr   Juleen China for ? Asthma     History of Present Illness  01/03/2018  Pulmonary/ 1st office eval/[facility]  Chief Complaint  Patient presents with  . Pulmonary Consult    Referred by Dr. Juleen China for eval of asthma.  She states she feels like she can not take a full, deep breath sometimes. She sees allergist- Dr Ernst Bowler.   Dyspnea:  Not limited by breathing from desired activities  / does have sensation of can't deep a breath, maybe once a day but lasts only a breath or two at most   Cough: sporadic, not noct an really not excessive but assoc symptoms of globus  Sleep: propped up / hob 30 degrees  SABA use: none   No obvious day to day or daytime variability or assoc excess/ purulent sputum or mucus plugs or hemoptysis or cp or chest tightness, subjective wheeze or overt sinus or hb symptoms.   Sleeps as above  without nocturnal  or early am exacerbation  of respiratory  c/o's or need for noct saba. Also denies any obvious fluctuation of symptoms with weather or environmental changes or other aggravating or alleviating factors except as outlined above   No unusual exposure hx or h/o childhood pna/ asthma or knowledge of premature birth.  Current Allergies, Complete Past Medical History, Past Surgical History, Family History, and Social History were reviewed in [employer] record.  ROS  The following are not active complaints unless bolded Hoarseness, sore throat, dysphagia, dental problems, itching, sneezing,  nasal congestion or discharge of excess mucus or  purulent secretions, ear ache,   fever, chills, sweats, unintended wt loss or wt gain, classically pleuritic or exertional cp,  orthopnea pnd or arm/hand swelling  or leg swelling, presyncope, palpitations, abdominal pain, anorexia, nausea, vomiting, diarrhea  or change in bowel habits or change in bladder habits, change in stools or change in urine, dysuria, hematuria,  rash, arthralgias, visual complaints, headache, numbness, weakness or ataxia or problems with walking or coordination,  change in mood or  memory.           Past Medical History:  Diagnosis Date  . Angio-edema   . Eczema   . Genital warts 11/19/2017  . 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 History of ovarian cancer 11/19/2017  . Hyperlipidemia 06/29/2017  . Hypertension 06/29/2017  . Malignant neoplastic disease ([place]) 06/29/2017  . Mild intermittent asthma without complication 40/98/1191  . Multiple sclerosis ([place]) 06/29/2017  . Postmenopausal 06/29/2017  . Scoliosis of lumbar spine 06/29/2017  . Small bowel obstruction ([place]) 06/29/2017  . Urticaria   . 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 Vitamin D deficiency 06/29/2017    Outpatient Medications Prior to Visit  Medication Sig Dispense Refill  . Cholecalciferol (VITAMIN D3) 1.25 MG (50000 UT) CAPS TAKE 1 CAPSULE BY MOUTH EVERY 7 DAYS  3  . Fingolimod HCl (GILENYA PO) Take 1 tablet by mouth daily.    Marland Kitchen GABAPENTIN, ONCE-DAILY, PO Take 1 tablet by mouth  3 (three) times daily. 1-3 tab qhs    . losartan (COZAAR) 25 MG tablet Take 25 mg by mouth daily.    . diclofenac sodium (VOLTAREN) 1 % GEL diclofenac 1 % topical gel  APPLY 2 GRAM TO THE AFFECTED AREA(S) 2-3 TIMES PER DAY           Objective:     BP 140/78 (BP Location: Left Arm, Cuff Size: Normal)   Pulse 66   Ht 5\' 6"  (1.676 m)   Wt 174 lb (78.9 kg)   SpO2 98%   BMI 28.08 kg/m   SpO2: 98 %   RA  amb depressed  wf jumping from one dx to the other without fully  answering the question asked with freq throat clearing s viz pnds   HEENT: nl dentition, turbinates bilaterally, and oropharynx. Nl external ear canals without cough reflex   NECK :  without JVD/Nodes/TM/ nl carotid upstrokes bilaterally   LUNGS: no acc muscle use,  Nl contour chest which is clear to A and P bilaterally without cough on insp or exp maneuvers   CV:  RRR  no s3 or murmur or increase in P2, and no edema   ABD:  soft and nontender with nl inspiratory excursion in the supine position. No bruits or organomegaly appreciated, bowel sounds nl  MS:  Nl gait/ ext warm without deformities, calf tenderness, cyanosis or clubbing No obvious joint restrictions   SKIN: warm and dry without lesions    NEURO:  alert, approp, nl sensorium with  no motor or cerebellar deficits apparent.       No cxr on file - last one "all clear" per pt w/in last 3 months        Assessment   Cough variant asthma vs UACS Spirometry 01/03/2018  FEV1 1.8 (65%)  Ratio 74 s curvature off all rx - FENO 01/03/2018  =  14    The standardized cough guidelines published in Chest by Lissa Morales in 2006 are still the best available and consist of a multiple step process (up to 12!) , not a single office visit,  and are intended  to address this problem logically,  with an alogrithm dependent on response to empiric treatment at  each progressive step  to determine a specific diagnosis with  minimal addtional testing needed. Therefore if adherence is an issue or can't be accurately verified,  it's very unlikely the standard evaluation and treatment will be successful here.    Furthermore, response to therapy (other than acute cough suppression, which should only be used short term with avoidance of narcotic containing cough syrups if possible), can be a gradual process for which the patient is not likely to  perceive immediate benefit.  Unlike going to an eye doctor where the best perscription is almost  always the first one and is immediately effective, this is almost never the case in the management of chronic cough syndromes. Therefore the patient needs to commit up front to consistently adhere to recommendations  for up to 6 weeks of therapy directed at the likely underlying problem(s) before the response can be reasonably evaluated.   Most likely this is not asthma based on above hx/ studies but rather Upper airway cough syndrome (previously labeled PNDS),  is so named because it's frequently impossible to sort out how much is  CR/sinusitis with freq throat clearing (which can be related to primary GERD)   vs  causing  secondary (" extra esophageal")  GERD from  wide swings in gastric pressure that occur with throat clearing, often  promoting self use of mint and menthol lozenges that reduce the lower esophageal sphincter tone and exacerbate the problem further in a cyclical fashion.   These are the same pts (now being labeled as having "irritable larynx syndrome" by some cough centers) who not infrequently have a history of having failed to tolerate ace inhibitors,  dry powder inhalers or biphosphonates or report having atypical/extraesophageal reflux symptoms that don't respond to standard doses of PPI  and are easily confused as having aecopd or asthma flares by even experienced allergists/ pulmonologists (myself included).    rec max rx for pnds with 1st gen H1 blockers per guidelines  And gerd diet and increase gabapentin to 300 mg per day for irritable larynx then purse allergy rx as planned and if not improving then consider refer to ENT / Dr Bettina Gavia at [facility] voice center.      Total time devoted to counseling  > 50 % of initial 60 min office visit:  review case with pt/ discussion of options/alternatives/ personally creating written customized instructions  in presence of pt  then going over those specific  Instructions directly with the pt including how to use all of the meds but in  particular covering each new medication in detail and the difference between the maintenance= "automatic" meds and the prns using an action plan format for the latter (If this problem/symptom => do that organization reading Left to right).  Please see AVS from this visit for a full list of these instructions which I personally wrote for this pt and  are unique to this visit.      Christinia Gully, MD 01/03/2018

## 2018-01-03 NOTE — Assessment & Plan Note (Signed)
Spirometry 01/03/2018  FEV1 1.8 (65%)  Ratio 74 s curvature off all rx - FENO 01/03/2018  =  14    The standardized cough guidelines published in Chest by Lissa Morales in 2006 are still the best available and consist of a multiple step process (up to 12!) , not a single office visit,  and are intended  to address this problem logically,  with an alogrithm dependent on response to empiric treatment at  each progressive step  to determine a specific diagnosis with  minimal addtional testing needed. Therefore if adherence is an issue or can't be accurately verified,  it's very unlikely the standard evaluation and treatment will be successful here.    Furthermore, response to therapy (other than acute cough suppression, which should only be used short term with avoidance of narcotic containing cough syrups if possible), can be a gradual process for which the patient is not likely to  perceive immediate benefit.  Unlike going to an eye doctor where the best perscription is almost always the first one and is immediately effective, this is almost never the case in the management of chronic cough syndromes. Therefore the patient needs to commit up front to consistently adhere to recommendations  for up to 6 weeks of therapy directed at the likely underlying problem(s) before the response can be reasonably evaluated.   Most likely this is not asthma based on above hx/ studies but rather Upper airway cough syndrome (previously labeled PNDS),  is so named because it's frequently impossible to sort out how much is  CR/sinusitis with freq throat clearing (which can be related to primary GERD)   vs  causing  secondary (" extra esophageal")  GERD from wide swings in gastric pressure that occur with throat clearing, often  promoting self use of mint and menthol lozenges that reduce the lower esophageal sphincter tone and exacerbate the problem further in a cyclical fashion.   These are the same pts (now being labeled as  having "irritable larynx syndrome" by some cough centers) who not infrequently have a history of having failed to tolerate ace inhibitors,  dry powder inhalers or biphosphonates or report having atypical/extraesophageal reflux symptoms that don't respond to standard doses of PPI  and are easily confused as having aecopd or asthma flares by even experienced allergists/ pulmonologists (myself included).    rec max rx for pnds with 1st gen H1 blockers per guidelines  And gerd diet and increase gabapentin to 300 mg per day for irritable larynx then purse allergy rx as planned and if not improving then consider refer to ENT / Dr Bettina Gavia at [facility] voice center.      Total time devoted to counseling  > 50 % of initial 60 min office visit:  review case with pt/ discussion of options/alternatives/ personally creating written customized instructions  in presence of pt  then going over those specific  Instructions directly with the pt including how to use all of the meds but in particular covering each new medication in detail and the difference between the maintenance= "automatic" meds and the prns using an action plan format for the latter (If this problem/symptom => do that organization reading Left to right).  Please see AVS from this visit for a full list of these instructions which I personally wrote for this pt and  are unique to this visit.

## 2018-01-03 NOTE — Patient Instructions (Signed)
For drainage / throat tickle try take CHLORPHENIRAMINE  4 mg - take one every 4 hours as needed - available over the counter- may cause drowsiness so start with just a bedtime dose or two and see how you tolerate it before trying in daytime    Increase the gabapentin to 3 at bedtime every night  Try to quit clearing your throat by using hard rock candies like jolley ranchers or live saver but avoid all mint or menthol containing lozenges and candies.   Pulmonary follow up is as needed

## 2018-01-04 ENCOUNTER — Ambulatory Visit (INDEPENDENT_AMBULATORY_CARE_PROVIDER_SITE_OTHER): Payer: No Typology Code available for payment source | Admitting: *Deleted

## 2018-01-04 DIAGNOSIS — J302 Other seasonal allergic rhinitis: Secondary | ICD-10-CM

## 2018-01-04 DIAGNOSIS — J3089 Other allergic rhinitis: Secondary | ICD-10-CM | POA: Diagnosis not present

## 2018-01-04 DIAGNOSIS — J309 Allergic rhinitis, unspecified: Secondary | ICD-10-CM

## 2018-01-04 NOTE — Progress Notes (Signed)
Immunotherapy   Patient Details  Name: Emily Wolfe MRN: [id number] Date of Birth: Nov 17, 1955  01/04/2018  Manfred Shirts started injections for  GRASS-RW, MOLD Following schedule: B  Frequency:2 times per week Epi-Pen:Epi-Pen Available  Consent signed and patient instructions given.   Romeo Apple R 01/04/2018, 2:19 PM

## 2018-01-07 ENCOUNTER — Ambulatory Visit (INDEPENDENT_AMBULATORY_CARE_PROVIDER_SITE_OTHER): Payer: No Typology Code available for payment source | Admitting: *Deleted

## 2018-01-07 DIAGNOSIS — J309 Allergic rhinitis, unspecified: Secondary | ICD-10-CM

## 2018-01-11 ENCOUNTER — Ambulatory Visit (INDEPENDENT_AMBULATORY_CARE_PROVIDER_SITE_OTHER): Payer: No Typology Code available for payment source | Admitting: *Deleted

## 2018-01-11 DIAGNOSIS — J309 Allergic rhinitis, unspecified: Secondary | ICD-10-CM

## 2018-01-13 ENCOUNTER — Ambulatory Visit (INDEPENDENT_AMBULATORY_CARE_PROVIDER_SITE_OTHER): Payer: No Typology Code available for payment source | Admitting: *Deleted

## 2018-01-13 DIAGNOSIS — J309 Allergic rhinitis, unspecified: Secondary | ICD-10-CM

## 2018-01-18 ENCOUNTER — Ambulatory Visit (INDEPENDENT_AMBULATORY_CARE_PROVIDER_SITE_OTHER): Payer: No Typology Code available for payment source

## 2018-01-18 DIAGNOSIS — J309 Allergic rhinitis, unspecified: Secondary | ICD-10-CM | POA: Diagnosis not present

## 2018-01-20 ENCOUNTER — Ambulatory Visit (INDEPENDENT_AMBULATORY_CARE_PROVIDER_SITE_OTHER): Payer: No Typology Code available for payment source

## 2018-01-20 DIAGNOSIS — J309 Allergic rhinitis, unspecified: Secondary | ICD-10-CM | POA: Diagnosis not present

## 2018-01-24 ENCOUNTER — Ambulatory Visit (INDEPENDENT_AMBULATORY_CARE_PROVIDER_SITE_OTHER): Payer: No Typology Code available for payment source | Admitting: *Deleted

## 2018-01-24 DIAGNOSIS — J309 Allergic rhinitis, unspecified: Secondary | ICD-10-CM | POA: Diagnosis not present

## 2018-01-27 ENCOUNTER — Ambulatory Visit (INDEPENDENT_AMBULATORY_CARE_PROVIDER_SITE_OTHER): Payer: No Typology Code available for payment source | Admitting: *Deleted

## 2018-01-27 DIAGNOSIS — J309 Allergic rhinitis, unspecified: Secondary | ICD-10-CM

## 2018-02-01 ENCOUNTER — Ambulatory Visit (INDEPENDENT_AMBULATORY_CARE_PROVIDER_SITE_OTHER): Payer: No Typology Code available for payment source | Admitting: *Deleted

## 2018-02-01 DIAGNOSIS — J309 Allergic rhinitis, unspecified: Secondary | ICD-10-CM

## 2018-02-03 ENCOUNTER — Ambulatory Visit (INDEPENDENT_AMBULATORY_CARE_PROVIDER_SITE_OTHER): Payer: No Typology Code available for payment source | Admitting: *Deleted

## 2018-02-03 DIAGNOSIS — J309 Allergic rhinitis, unspecified: Secondary | ICD-10-CM

## 2018-02-11 ENCOUNTER — Ambulatory Visit (INDEPENDENT_AMBULATORY_CARE_PROVIDER_SITE_OTHER): Payer: No Typology Code available for payment source

## 2018-02-11 DIAGNOSIS — J309 Allergic rhinitis, unspecified: Secondary | ICD-10-CM | POA: Diagnosis not present

## 2018-02-15 ENCOUNTER — Ambulatory Visit (INDEPENDENT_AMBULATORY_CARE_PROVIDER_SITE_OTHER): Payer: No Typology Code available for payment source | Admitting: *Deleted

## 2018-02-15 DIAGNOSIS — J309 Allergic rhinitis, unspecified: Secondary | ICD-10-CM

## 2018-02-18 ENCOUNTER — Ambulatory Visit (INDEPENDENT_AMBULATORY_CARE_PROVIDER_SITE_OTHER): Payer: No Typology Code available for payment source | Admitting: *Deleted

## 2018-02-18 DIAGNOSIS — J309 Allergic rhinitis, unspecified: Secondary | ICD-10-CM | POA: Diagnosis not present

## 2018-02-23 ENCOUNTER — Ambulatory Visit (INDEPENDENT_AMBULATORY_CARE_PROVIDER_SITE_OTHER): Payer: No Typology Code available for payment source | Admitting: *Deleted

## 2018-02-23 DIAGNOSIS — J309 Allergic rhinitis, unspecified: Secondary | ICD-10-CM

## 2018-03-01 ENCOUNTER — Ambulatory Visit (INDEPENDENT_AMBULATORY_CARE_PROVIDER_SITE_OTHER): Payer: No Typology Code available for payment source | Admitting: *Deleted

## 2018-03-01 DIAGNOSIS — J309 Allergic rhinitis, unspecified: Secondary | ICD-10-CM

## 2018-03-03 ENCOUNTER — Ambulatory Visit (INDEPENDENT_AMBULATORY_CARE_PROVIDER_SITE_OTHER): Payer: No Typology Code available for payment source | Admitting: *Deleted

## 2018-03-03 DIAGNOSIS — J309 Allergic rhinitis, unspecified: Secondary | ICD-10-CM | POA: Diagnosis not present

## 2018-03-09 ENCOUNTER — Ambulatory Visit (INDEPENDENT_AMBULATORY_CARE_PROVIDER_SITE_OTHER): Payer: No Typology Code available for payment source | Admitting: *Deleted

## 2018-03-09 DIAGNOSIS — J309 Allergic rhinitis, unspecified: Secondary | ICD-10-CM | POA: Diagnosis not present

## 2018-03-11 ENCOUNTER — Ambulatory Visit (INDEPENDENT_AMBULATORY_CARE_PROVIDER_SITE_OTHER): Payer: No Typology Code available for payment source | Admitting: *Deleted

## 2018-03-11 DIAGNOSIS — J309 Allergic rhinitis, unspecified: Secondary | ICD-10-CM

## 2018-03-16 ENCOUNTER — Ambulatory Visit (INDEPENDENT_AMBULATORY_CARE_PROVIDER_SITE_OTHER): Payer: No Typology Code available for payment source | Admitting: *Deleted

## 2018-03-16 DIAGNOSIS — J309 Allergic rhinitis, unspecified: Secondary | ICD-10-CM

## 2018-03-23 ENCOUNTER — Other Ambulatory Visit: Payer: Self-pay

## 2018-03-23 ENCOUNTER — Ambulatory Visit (INDEPENDENT_AMBULATORY_CARE_PROVIDER_SITE_OTHER): Payer: No Typology Code available for payment source

## 2018-03-23 DIAGNOSIS — J309 Allergic rhinitis, unspecified: Secondary | ICD-10-CM

## 2018-03-28 ENCOUNTER — Ambulatory Visit (INDEPENDENT_AMBULATORY_CARE_PROVIDER_SITE_OTHER): Payer: No Typology Code available for payment source | Admitting: *Deleted

## 2018-03-28 ENCOUNTER — Telehealth: Payer: Self-pay | Admitting: Family Medicine

## 2018-03-28 DIAGNOSIS — J309 Allergic rhinitis, unspecified: Secondary | ICD-10-CM

## 2018-03-28 MED ORDER — LOSARTAN POTASSIUM 25 MG PO TABS: 25.0000 mg | ORAL_TABLET | Freq: Every day | ORAL | 0 refills | Status: DC

## 2018-03-28 NOTE — Telephone Encounter (Signed)
See note

## 2018-03-28 NOTE — Telephone Encounter (Signed)
Copied from [place] [id number]. Topic: Quick Communication - Rx Refill/Question >> Mar 28, 2018  4:19 PM Rayann Heman wrote: Medication:losartan (COZAAR) 25 MG tablet [[id number]] needs 90 days  Has the patient contacted their pharmacy? yes Preferred Pharmacy (with phone number or street name):[id number] [place], [place] - [address] [phone] (Phone) [phone] (Fax)  Agent: Please be advised that RX refills may take up to 3 business days. We ask that you follow-up with your pharmacy.

## 2018-03-28 NOTE — Telephone Encounter (Signed)
Rx refill sent to pharmacy for Losartan.

## 2018-04-07 ENCOUNTER — Ambulatory Visit (INDEPENDENT_AMBULATORY_CARE_PROVIDER_SITE_OTHER): Payer: No Typology Code available for payment source

## 2018-04-07 DIAGNOSIS — J309 Allergic rhinitis, unspecified: Secondary | ICD-10-CM | POA: Diagnosis not present

## 2018-04-12 ENCOUNTER — Other Ambulatory Visit: Payer: Self-pay | Admitting: Family Medicine

## 2018-04-12 NOTE — Telephone Encounter (Signed)
Patient left voicemail Losartan Potassium 25 mg to [facility] [address]. Pt states she did contact the pharmacy and states that it was filled by previous PCP and pharmacy states that prescription is due to be renewed so medication was not filled.

## 2018-04-12 NOTE — Telephone Encounter (Signed)
See note

## 2018-04-13 MED ORDER — LOSARTAN POTASSIUM 25 MG PO TABS: 25.0000 mg | ORAL_TABLET | Freq: Every day | ORAL | 0 refills | Status: DC

## 2018-04-13 NOTE — Telephone Encounter (Signed)
Last OV 12/15/2017 Last refill 03/28/2018 #90/0 Next OV 06/13/2018

## 2018-04-13 NOTE — Addendum Note (Signed)
Addended by: Jasper Loser on: 04/13/2018 10:53 AM   Modules accepted: Orders

## 2018-04-13 NOTE — Telephone Encounter (Signed)
Rx refilled. Called pt and left general VM advising rx refilled.

## 2018-04-20 ENCOUNTER — Ambulatory Visit (INDEPENDENT_AMBULATORY_CARE_PROVIDER_SITE_OTHER): Payer: No Typology Code available for payment source

## 2018-04-20 DIAGNOSIS — J309 Allergic rhinitis, unspecified: Secondary | ICD-10-CM | POA: Diagnosis not present

## 2018-04-28 ENCOUNTER — Telehealth: Payer: Self-pay | Admitting: *Deleted

## 2018-04-28 ENCOUNTER — Ambulatory Visit (INDEPENDENT_AMBULATORY_CARE_PROVIDER_SITE_OTHER): Payer: No Typology Code available for payment source | Admitting: *Deleted

## 2018-04-28 DIAGNOSIS — J309 Allergic rhinitis, unspecified: Secondary | ICD-10-CM

## 2018-04-28 NOTE — Telephone Encounter (Signed)
Patient came in today and stated she does not feel like our allergy shots are as effective as her previous shot prescription. She states that she has had the worst allergy season this year that she has had in years. She is having post nasal drip, congestion, itchy eyes. She is taking her allergy pill every day. Patient did state she is not doing her nasal spray but feels her nose is to clogged up to even use it. Please advise.

## 2018-04-29 NOTE — Telephone Encounter (Signed)
Called patient and was unable to leave a message

## 2018-04-29 NOTE — Telephone Encounter (Signed)
Please provide a short prednisone burst: Take two tablets (20mg ) twice daily for three days, then one tablet (10mg ) twice daily for three days, then STOP.  She can use Afrin twice daily for up to 3-4 days at a time to help get the nose spray up to where it needs to be. We could advance her to [facility] two sprays per nostril daily. We can provide a sample and send in a free three months.   I could re-mix her vials to make them slightly stronger - especially with ragweed and grass. We could keep the mold vial where it is currently.   Salvatore Marvel, MD [facility] [place] [facility] [place]

## 2018-05-02 NOTE — Telephone Encounter (Signed)
I called patient again and did not receive an answer.

## 2018-05-04 MED ORDER — PREDNISONE 10 MG PO TABS: ORAL_TABLET | ORAL | 0 refills | Status: AC

## 2018-05-04 NOTE — Addendum Note (Signed)
Addended by: Lucrezia Starch I on: 05/04/2018 01:40 PM   Modules accepted: Orders

## 2018-05-04 NOTE — Telephone Encounter (Signed)
Patient informed of instructions and prednisone sent to pharmacy. She will take this first then let us know how she feels.

## 2018-05-05 ENCOUNTER — Ambulatory Visit (INDEPENDENT_AMBULATORY_CARE_PROVIDER_SITE_OTHER): Payer: No Typology Code available for payment source

## 2018-05-05 DIAGNOSIS — J309 Allergic rhinitis, unspecified: Secondary | ICD-10-CM

## 2018-05-13 ENCOUNTER — Ambulatory Visit (INDEPENDENT_AMBULATORY_CARE_PROVIDER_SITE_OTHER): Payer: No Typology Code available for payment source | Admitting: *Deleted

## 2018-05-13 DIAGNOSIS — J309 Allergic rhinitis, unspecified: Secondary | ICD-10-CM | POA: Diagnosis not present

## 2018-05-18 NOTE — Progress Notes (Signed)
VIALS EXP 05-18-2019 

## 2018-05-19 DIAGNOSIS — J301 Allergic rhinitis due to pollen: Secondary | ICD-10-CM | POA: Diagnosis not present

## 2018-05-27 ENCOUNTER — Ambulatory Visit (INDEPENDENT_AMBULATORY_CARE_PROVIDER_SITE_OTHER): Payer: No Typology Code available for payment source | Admitting: *Deleted

## 2018-05-27 DIAGNOSIS — J309 Allergic rhinitis, unspecified: Secondary | ICD-10-CM

## 2018-06-02 ENCOUNTER — Ambulatory Visit (INDEPENDENT_AMBULATORY_CARE_PROVIDER_SITE_OTHER): Payer: No Typology Code available for payment source

## 2018-06-02 DIAGNOSIS — J309 Allergic rhinitis, unspecified: Secondary | ICD-10-CM | POA: Diagnosis not present

## 2018-06-10 ENCOUNTER — Ambulatory Visit (INDEPENDENT_AMBULATORY_CARE_PROVIDER_SITE_OTHER): Payer: No Typology Code available for payment source

## 2018-06-10 DIAGNOSIS — J309 Allergic rhinitis, unspecified: Secondary | ICD-10-CM

## 2018-06-11 NOTE — Progress Notes (Signed)
Virtual Visit via Video   Due to the COVID-19 pandemic, this visit was completed with telemedicine (audio/video) technology to reduce patient and provider exposure as well as to preserve personal protective equipment.   I connected with Emily Wolfe by a video enabled telemedicine application and verified that I am speaking with the correct person using two identifiers. Location patient: Home Location provider:  [facility], Office Persons participating in the virtual visit: Sharlet Notaro, Briscoe Deutscher, DO Lonell Grandchild, CMA acting as scribe for Dr. Briscoe Deutscher.   I discussed the limitations of evaluation and management by telemedicine and the availability of in person appointments. The patient expressed understanding and agreed to proceed.  Care Team   Patient Care Team: Briscoe Deutscher, DO as PCP - General (Family Medicine) Dorien Chihuahua, MD as Referring Physician (Obstetrics and Gynecology) Valentina Shaggy, MD as Consulting Physician (Allergy and Immunology) Janece Canterbury, MD as Referring Physician (Gynecology)  Subjective:   HPI: Patient has had increased pain in arm. Not sure if it is from the MS or if she needs additional work up. When her mother had cancer it returned in her bones and she is concerned that may be issue.   History of ovarian cancer She has a h/o ovarian cancer, first BSO, then hysterectomy a week later. She did receive chemotherapy. She does not recall what type of cancer she had. Chemo was delayed due to postoperative infection. Performed Dr. Pryor Ochoa in [place]. Followed by GYN in [place] now as well.   Overweight (BMI 25.0-29.9) The patient is asked to make an attempt to improve diet and exercise patterns to aid in medical management of this problem.   Multiple sclerosis ([place]), followed by Dr. Gerald Leitz, seen twice yearly, on Fingolimod Well controlled.  No signs of complications, medication side effects, or red flags.  Continue current  regimen.    Hypertension History: Review: taking medications as instructed, no medication side effects noted, no TIAs, no chest pain on exertion, no dyspnea on exertion, no swelling of ankles. Smoker: No. She has been checking her blood pressure at home and they have fluctuating between 140/80 and 120/70.   Review of Systems  Constitutional: Negative for chills and fever.  HENT: Negative for ear pain, hearing loss and tinnitus.   Eyes: Negative for blurred vision, double vision and photophobia.  Respiratory: Negative for cough and shortness of breath.   Cardiovascular: Negative for chest pain, palpitations and leg swelling.  Gastrointestinal: Negative for heartburn, nausea and vomiting.  Genitourinary: Negative for dysuria, frequency and urgency.  Musculoskeletal: Negative for neck pain.       B/l arm pain   Skin: Negative for rash.  Neurological: Negative for dizziness, tingling and headaches.  Psychiatric/Behavioral: Negative for depression and suicidal ideas.    Patient Active Problem List   Diagnosis Date Noted  . Cough variant asthma vs UACS 01/03/2018  . History of total hysterectomy, due to ovarian cancer 12/26/2017  . Genital warts 11/19/2017  . History of ovarian cancer 11/19/2017  . Seasonal and perennial allergic rhinitis, followed by Allergist, treated with saline rinses and allergy shots 11/02/2017  . Mild intermittent asthma without complication 63/78/5885  . Hyperlipidemia 06/29/2017  . Hypertension 06/29/2017  . Multiple sclerosis ([place]), followed by Dr. Gerald Leitz, seen twice yearly, on Fingolimod 06/29/2017  . Scoliosis of lumbar spine 06/29/2017  . 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 Vitamin D deficiency 06/29/2017  . 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  Lung mass 06/29/2017  . Overweight (BMI 25.0-29.9) 06/29/2017  . Colon abnormality 05/11/2017    Social History   Tobacco Use  . Smoking status: Never Smoker  .  Smokeless tobacco: Never Used  Substance Use Topics  . Alcohol use: Not Currently    Current Outpatient Medications:  .  baclofen (LIORESAL) 10 MG tablet, Take 10 mg by mouth 3 (three) times daily., Disp: , Rfl:  .  Cholecalciferol (VITAMIN D3) 1.25 MG (50000 UT) CAPS, TAKE 1 CAPSULE BY MOUTH EVERY 7 DAYS, Disp: , Rfl: 3 .  diclofenac sodium (VOLTAREN) 1 % GEL, diclofenac 1 % topical gel  APPLY 2 GRAM TO THE AFFECTED AREA(S) 2-3 TIMES PER DAY, Disp: , Rfl:  .  Fingolimod HCl (GILENYA PO), Take 1 tablet by mouth daily., Disp: , Rfl:  .  losartan (COZAAR) 25 MG tablet, Take 1 tablet (25 mg total) by mouth daily., Disp: 90 tablet, Rfl: 0 .  meloxicam (MOBIC) 15 MG tablet, Take 15 mg by mouth daily., Disp: , Rfl:  .  diazepam (VALIUM) 5 MG tablet, Take 1 tablet (5 mg total) by mouth at bedtime as needed for anxiety., Disp: 30 tablet, Rfl: 1  No Known Allergies  Objective:   VITALS: Per patient if applicable, see vitals. GENERAL: Alert, appears well and in no acute distress. HEENT: Atraumatic, conjunctiva clear, no obvious abnormalities on inspection of external nose and ears. NECK: Normal movements of the head and neck. CARDIOPULMONARY: No increased WOB. Speaking in clear sentences. I:E ratio WNL.  MS: Moves all visible extremities without noticeable abnormality. PSYCH: Pleasant and cooperative, well-groomed. Speech normal rate and rhythm. Affect is appropriate. Insight and judgement are appropriate. Attention is focused, linear, and appropriate.  NEURO: CN grossly intact. Oriented as arrived to appointment on time with no prompting. Moves both UE equally.  SKIN: No obvious lesions, wounds, erythema, or cyanosis noted on face or hands.  Depression screen [facility] 2/9 06/13/2018 12/15/2017  Decreased Interest 0 0  Down, Depressed, Hopeless 0 1  PHQ - 2 Score 0 1  Altered sleeping 3 1  Tired, decreased energy 0 3  Change in appetite 0 3  Feeling bad or failure about yourself  0 0  Trouble  concentrating 0 0  Moving slowly or fidgety/restless 0 0  Suicidal thoughts 0 0  PHQ-9 Score 3 8  Difficult doing work/chores Not difficult at all Somewhat difficult   Assessment and Plan:   Lacey was seen today for follow-up.  Diagnoses and all orders for this visit:  Pain in both upper extremities -     MR Cervical Spine Wo Contrast; Future -     Sedimentation rate -     C-reactive protein -     diazepam (VALIUM) 5 MG tablet; Take 1 tablet (5 mg total) by mouth at bedtime as needed for anxiety.  Encounter for hepatitis C virus screening test for high risk patient -     Hepatitis C antibody  Screening for HIV (human immunodeficiency virus) -     HIV Antibody (routine testing w rflx)  Multiple sclerosis ([place]) -     MR Cervical Spine Wo Contrast; Future  History of ovarian cancer  Malaise and fatigue -     CBC with Differential/Platelet -     Comprehensive metabolic panel -     Vitamin B12 -     TSH   . COVID-19 Education: The signs and symptoms of COVID-19 were discussed with the patient and how to seek care  for testing if needed. The importance of social distancing was discussed today. . Reviewed expectations re: course of current medical issues. . Discussed self-management of symptoms. . Outlined signs and symptoms indicating need for more acute intervention. . Patient verbalized understanding and all questions were answered. Marland Kitchen Health Maintenance issues including appropriate healthy diet, exercise, and smoking avoidance were discussed with patient. . See orders for this visit as documented in the electronic medical record.  Briscoe Deutscher, DO  Records requested if needed. Time spent: 25 minutes, of which >50% was spent in obtaining information about her symptoms, reviewing her previous labs, evaluations, and treatments, counseling her about her condition (please see the discussed topics above), and developing a plan to further investigate it; she had a number of  questions which I addressed.

## 2018-06-13 ENCOUNTER — Encounter: Payer: Self-pay | Admitting: Family Medicine

## 2018-06-13 ENCOUNTER — Ambulatory Visit (INDEPENDENT_AMBULATORY_CARE_PROVIDER_SITE_OTHER): Payer: No Typology Code available for payment source | Admitting: Family Medicine

## 2018-06-13 ENCOUNTER — Other Ambulatory Visit: Payer: Self-pay

## 2018-06-13 VITALS — BP 141/74 | Ht 66.0 in | Wt 174.0 lb

## 2018-06-13 DIAGNOSIS — Z114 Encounter for screening for human immunodeficiency virus [HIV]: Secondary | ICD-10-CM | POA: Diagnosis not present

## 2018-06-13 DIAGNOSIS — G35 Multiple sclerosis: Secondary | ICD-10-CM | POA: Diagnosis not present

## 2018-06-13 DIAGNOSIS — R5381 Other malaise: Secondary | ICD-10-CM

## 2018-06-13 DIAGNOSIS — Z8543 Personal history of malignant neoplasm of ovary: Secondary | ICD-10-CM

## 2018-06-13 DIAGNOSIS — R5383 Other fatigue: Secondary | ICD-10-CM

## 2018-06-13 DIAGNOSIS — Z1159 Encounter for screening for other viral diseases: Secondary | ICD-10-CM

## 2018-06-13 DIAGNOSIS — M79601 Pain in right arm: Secondary | ICD-10-CM | POA: Diagnosis not present

## 2018-06-13 DIAGNOSIS — Z9189 Other specified personal risk factors, not elsewhere classified: Secondary | ICD-10-CM

## 2018-06-13 DIAGNOSIS — M79602 Pain in left arm: Secondary | ICD-10-CM

## 2018-06-13 MED ORDER — DIAZEPAM 5 MG PO TABS: 5.0000 mg | ORAL_TABLET | Freq: Every evening | ORAL | 1 refills | Status: DC | PRN

## 2018-06-15 ENCOUNTER — Encounter: Payer: Self-pay | Admitting: Family Medicine

## 2018-06-16 ENCOUNTER — Encounter: Payer: Self-pay | Admitting: Family Medicine

## 2018-06-16 ENCOUNTER — Ambulatory Visit (INDEPENDENT_AMBULATORY_CARE_PROVIDER_SITE_OTHER): Payer: No Typology Code available for payment source

## 2018-06-16 ENCOUNTER — Ambulatory Visit: Payer: Self-pay | Admitting: *Deleted

## 2018-06-16 ENCOUNTER — Ambulatory Visit (INDEPENDENT_AMBULATORY_CARE_PROVIDER_SITE_OTHER): Payer: No Typology Code available for payment source | Admitting: *Deleted

## 2018-06-16 ENCOUNTER — Ambulatory Visit (INDEPENDENT_AMBULATORY_CARE_PROVIDER_SITE_OTHER): Payer: No Typology Code available for payment source | Admitting: Family Medicine

## 2018-06-16 VITALS — BP 143/78 | Ht 66.0 in | Wt 183.0 lb

## 2018-06-16 DIAGNOSIS — M79675 Pain in left toe(s): Secondary | ICD-10-CM

## 2018-06-16 DIAGNOSIS — S99922A Unspecified injury of left foot, initial encounter: Secondary | ICD-10-CM | POA: Diagnosis not present

## 2018-06-16 DIAGNOSIS — J309 Allergic rhinitis, unspecified: Secondary | ICD-10-CM

## 2018-06-16 IMAGING — DX LEFT FOOT - COMPLETE 3+ VIEW
3 series · 3 of 3 positions shown · non-contrast
Comparison: None.

CLINICAL DATA: Foot pain injury.

EXAM:
LEFT FOOT - COMPLETE 3+ VIEW

[foot dp]
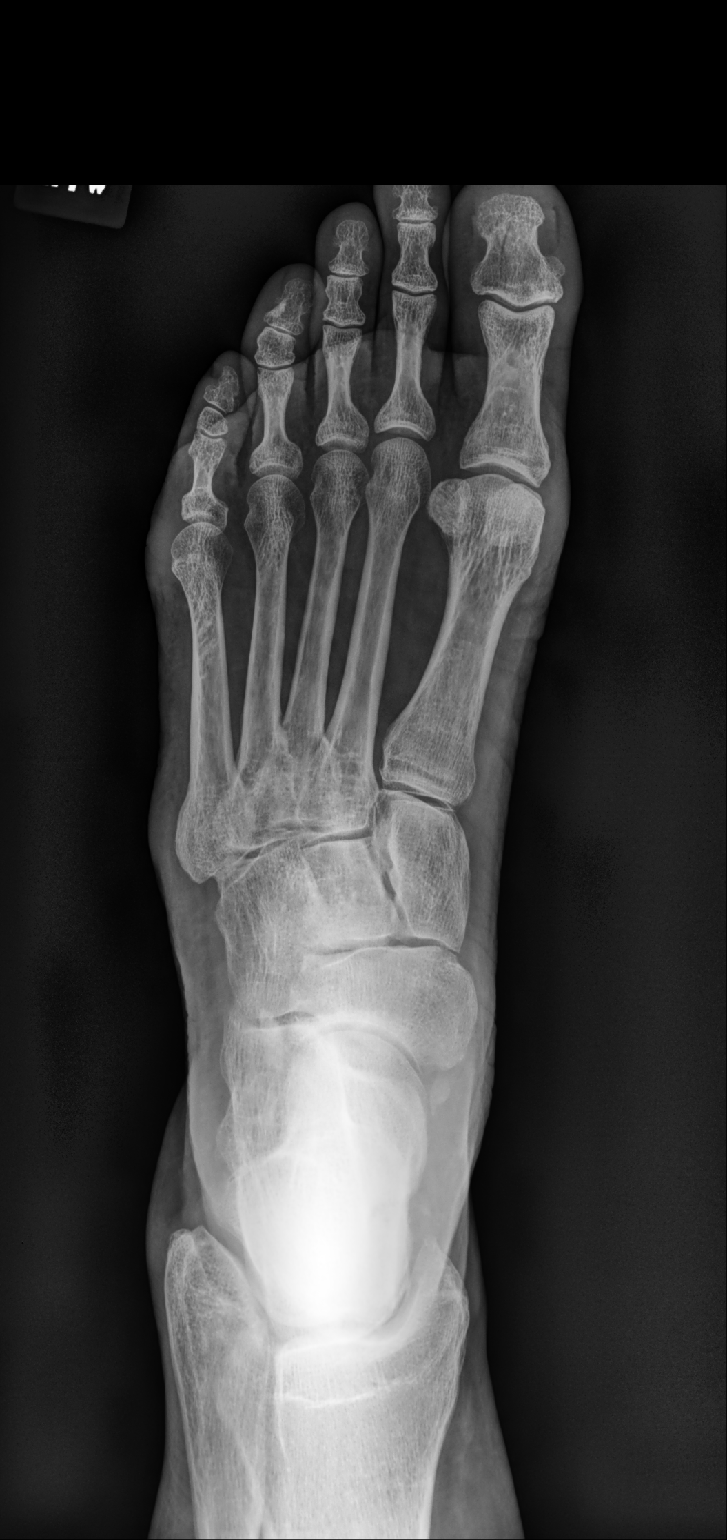

[foot oblique]
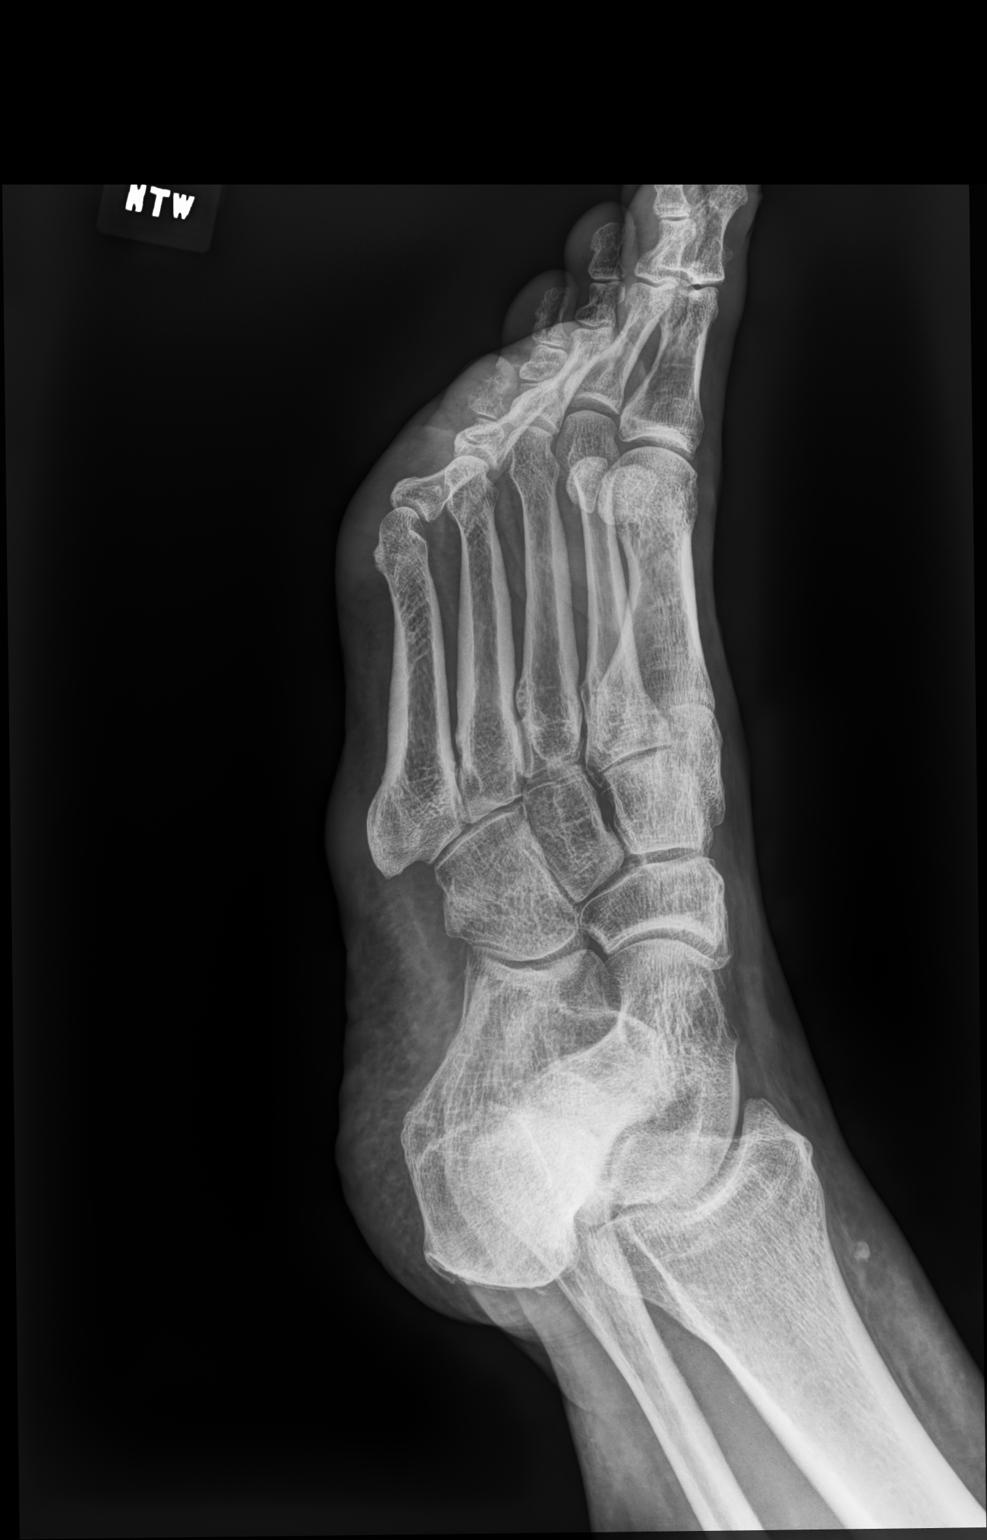

[foot lat]
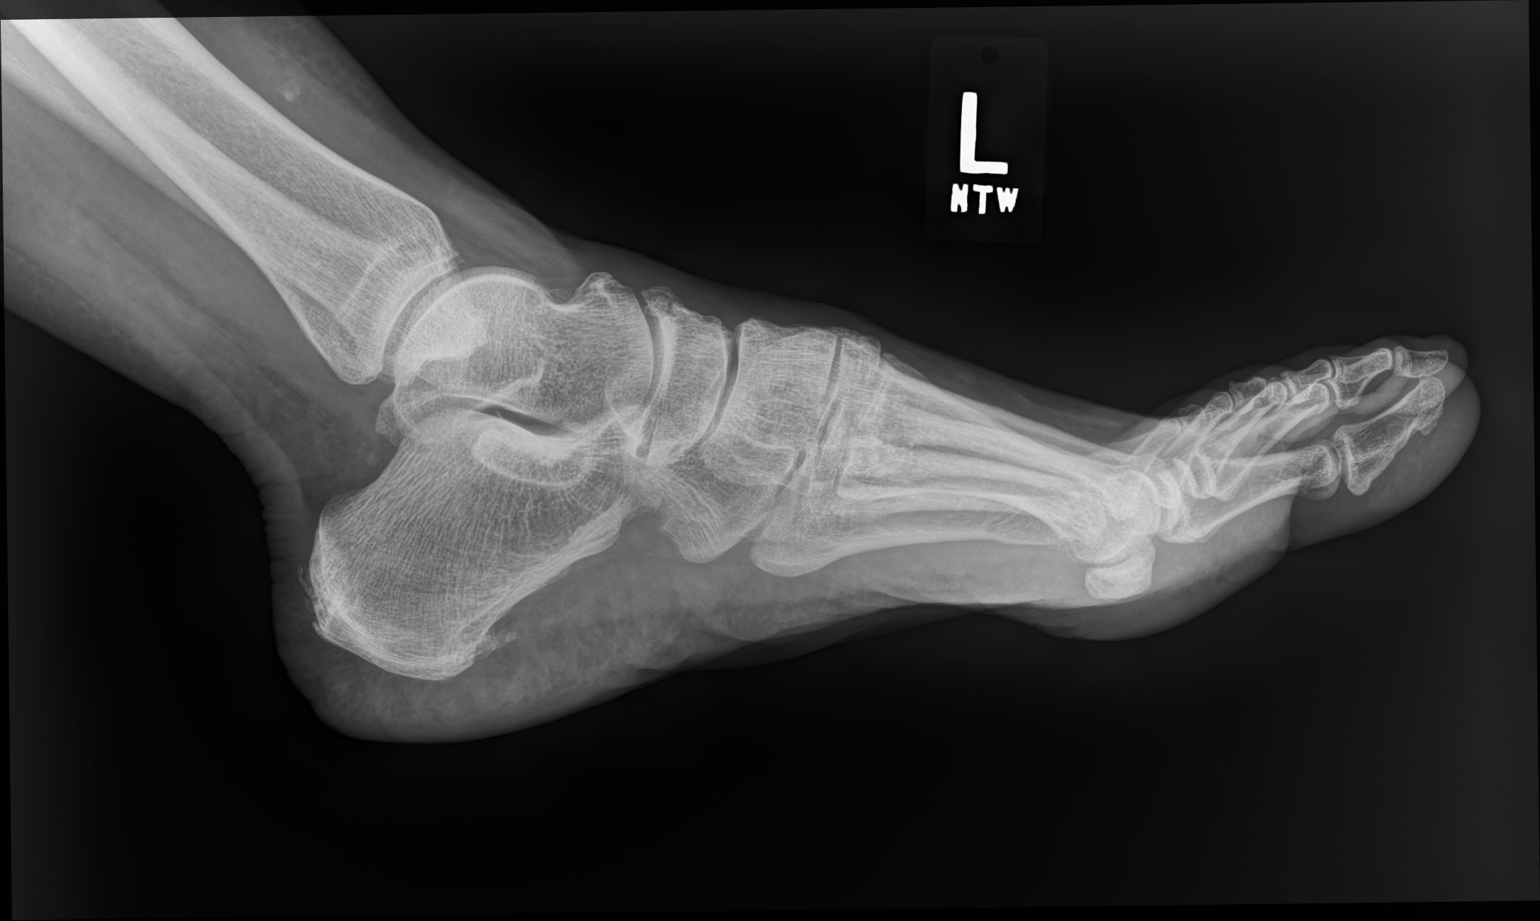

[3 of 3 positions shown; findings below may reference images not displayed]

FINDINGS: There is a lucency coursing through the distal phalanx of the first
digit. There is surrounding soft tissue swelling. There are minimal
degenerative changes. There is a small plantar calcaneal spur. There
are degenerative changes of the midfoot.
IMPRESSION: Lucency through the distal phalanx of the first digit, only
visualized on a single view. This may represent a nondisplaced
fracture versus artifact. A 2 week follow-up x-ray may be useful for
further characterization following conservative management.

## 2018-06-16 NOTE — Telephone Encounter (Signed)
  Pt called with pain in her left foot, which she dropped a 5 lb paver on it. It was bruised and swollen, did not break the skin. But now it is just painful to walk. Denies any other symptoms. She is requesting an appointment for an xray of her foot. Advised of having a virtual appointment. Pt voiced understanding. LB at [facility] notified for an appointment. Call transferred to the office.  Reason for Disposition . [1] Limp when walking AND [2] due to a direct blow or crushing injury  Answer Assessment - Initial Assessment Questions 1. MECHANISM: "How did the injury happen?" (e.g., twisting injury, direct blow)     Dropped a 5 lb paver on her foot 2. ONSET: "When did the injury happen?" (Minutes or hours ago)     1 week ago 3. LOCATION: "Where is the injury located?"      Big toe, left foot 4. APPEARANCE of INJURY: "What does the injury look like?"      Can see anything 5. WEIGHT-BEARING: "Can you put weight on that foot?" "Can you walk (four steps or more)?"       Painful when walking 6. SIZE: For cuts, bruises, or swelling, ask: "How large is it?" (e.g., inches or centimeters;  entire joint)      no 7. PAIN: "Is there pain?" If so, ask: "How bad is the pain?"    (e.g., Scale 1-10; or mild, moderate, severe)     Yes pain # 5 uncomfortable right now 8. TETANUS: For any breaks in the skin, ask: "When was the last tetanus booster?"     no 9. OTHER SYMPTOMS: "Do you have any other symptoms?"      no 10. PREGNANCY: "Is there any chance you are pregnant?" "When was your last mensrual period?"       no  Protocols used: FOOT AND ANKLE INJURY-A-AH

## 2018-06-16 NOTE — Patient Instructions (Signed)
It was very nice to see you today!  Please keep your toe taped as much as possible. Please wear the shoe as much as you can.   Let me   Take care, Dr Jerline Pain

## 2018-06-16 NOTE — Telephone Encounter (Signed)
Appt scheduled

## 2018-06-16 NOTE — Progress Notes (Signed)
   Chief Complaint:  Emily Wolfe is a 63 y.o. female who presents for same day appointment with a chief complaint of left foot pain.   Assessment/Plan:  Left great toe pain Plain film today shows small lucency-not sure if this represents hairline fracture versus vasculature.  She has a small amount of ecchymosis on exam however no other abnormalities.  Will treat conservatively with buddy taping and postop shoe for the next 1 to 2 weeks.  She will continue taking her meloxicam as needed.  Discussed reasons to return to care.  Follow up as needed.      Subjective:  HPI:  Left Great Toe Pain, acute problem Symptoms started about a week ago.  Patient was doing all improvement when she dropped a 5 pound paver on her left great toe.  Immediately had bruising and pain to the area.  Symptoms have not improved over the past week.  Worse with walking.  She takes Mobic and Aleve which modestly help with the pain.  She has had some difficulty curling her toes.  Swelling seems to be improving.  No other treatments tried.  No other obvious alleviating or aggravating factors. ROS: Per HPI  PMH: She reports that she has never smoked. She has never used smokeless tobacco. She reports previous alcohol use. She reports previous drug use.      Objective:  Physical Exam: There were no vitals taken for this visit.  Gen: NAD, resting comfortably MSK: Left great toe with scattered ecchymoses and very slight subungual hematoma.  Neurovascular intact distally.  Full range of motion throughout.  Tender to palpation along proximal aspect of distal phalanx.     Algis Greenhouse. Jerline Pain, MD 06/16/2018 2:39 PM

## 2018-06-17 NOTE — Progress Notes (Signed)
Please inform patient of the following:  Radiology reviewed her xray and did not see any other findings than what we discussed. Would like for her to let us know if things are not improving in the next 1-2 weeks.  Algis Greenhouse. Jerline Pain, MD 06/17/2018 8:34 AM

## 2018-06-20 ENCOUNTER — Telehealth: Payer: Self-pay | Admitting: Physical Therapy

## 2018-06-21 ENCOUNTER — Telehealth: Payer: Self-pay | Admitting: Physical Therapy

## 2018-06-21 NOTE — Telephone Encounter (Signed)
Routed this message to both Jerline Pain and Juleen China teamcare due to 2 separate issues noted in pt call note.  Thanks, Baker  Copied from [employer] [phone]. Topic: General - Other >> Jun 21, 2018  9:33 AM Leward Quan A wrote: Patient called stated that she was returning a call to [facility] and that she was to follow up with her fractured toe. Also patient say that Dr Juleen China was to set her up for an MRI and she have not heard anything. Please advise Ph# [phone]

## 2018-06-21 NOTE — Telephone Encounter (Signed)
Copied from [place] [id number]. Topic: General - Other >> Jun 21, 2018  9:33 AM Leward Quan A wrote: Patient called stated that she was returning a call to [facility] and that she was to follow up with her fractured toe. Also patient say that Dr Juleen China was to set her up for an MRI and she have not heard anything. Please advise Ph# [phone]

## 2018-06-22 NOTE — Telephone Encounter (Signed)
Notified patient Referral placed and that they will contact patient,voices understanding.

## 2018-06-23 ENCOUNTER — Ambulatory Visit (INDEPENDENT_AMBULATORY_CARE_PROVIDER_SITE_OTHER): Payer: No Typology Code available for payment source

## 2018-06-23 DIAGNOSIS — J309 Allergic rhinitis, unspecified: Secondary | ICD-10-CM | POA: Diagnosis not present

## 2018-06-29 NOTE — Telephone Encounter (Signed)
Pt stated its been two weeks and she still hasnt been called or heard anything about her set up for a MRI. Please advise

## 2018-06-29 NOTE — Telephone Encounter (Signed)
Can you check on the status of this referral and update the patient?

## 2018-07-01 ENCOUNTER — Ambulatory Visit (INDEPENDENT_AMBULATORY_CARE_PROVIDER_SITE_OTHER): Payer: No Typology Code available for payment source | Admitting: *Deleted

## 2018-07-01 DIAGNOSIS — J309 Allergic rhinitis, unspecified: Secondary | ICD-10-CM

## 2018-07-05 ENCOUNTER — Ambulatory Visit
Admission: RE | Admit: 2018-07-05 | Discharge: 2018-07-05 | Disposition: A | Discharge status: Home / Self Care | Payer: No Typology Code available for payment source | Source: Ambulatory Visit | Attending: Family Medicine | Admitting: Family Medicine

## 2018-07-05 ENCOUNTER — Other Ambulatory Visit: Payer: Self-pay

## 2018-07-05 DIAGNOSIS — G35 Multiple sclerosis: Secondary | ICD-10-CM

## 2018-07-05 DIAGNOSIS — M79601 Pain in right arm: Secondary | ICD-10-CM

## 2018-07-05 DIAGNOSIS — M79602 Pain in left arm: Secondary | ICD-10-CM

## 2018-07-05 IMAGING — MR MRI CERVICAL SPINE WITHOUT CONTRAST
5 series · 29 of 48 positions shown · non-contrast
Comparison: None.

CLINICAL DATA: Multiple sclerosis.  Bilateral upper extremity pain

EXAM:
MRI CERVICAL SPINE WITHOUT CONTRAST
TECHNIQUE: Multiplanar, multisequence MR imaging of the cervical spine was
performed. No intravenous contrast was administered.

[Series 2: T2 · sagittal · 3.0mm · 0.41mm/px · 6 of 13 slices shown (1 of 2)]
[im 1/13]
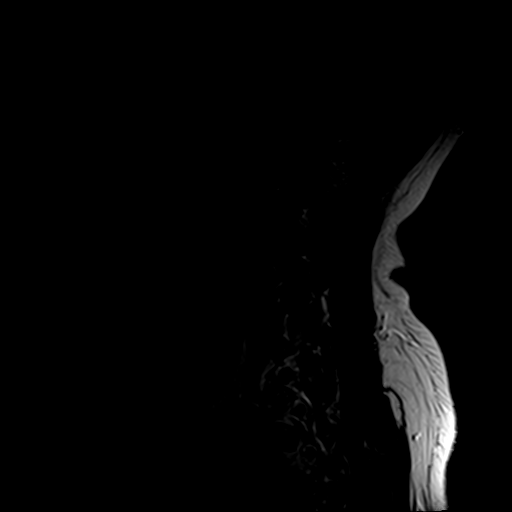
[im 3/13]
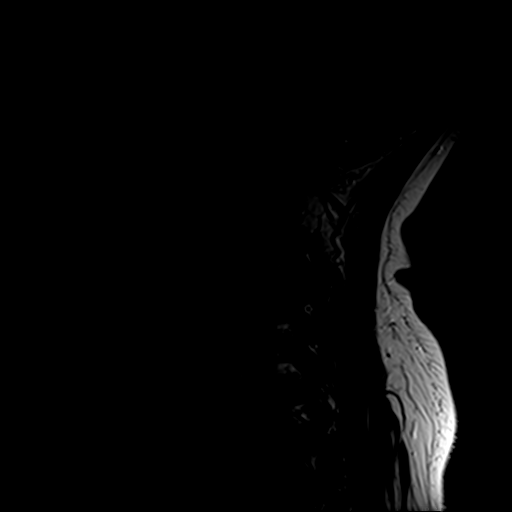
[im 5/13]
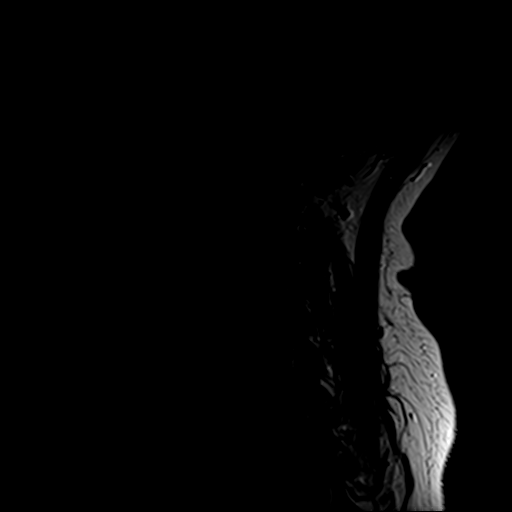
[im 8/13]
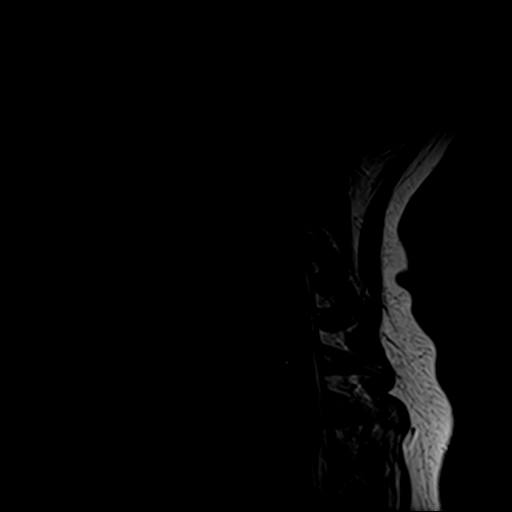
[im 10/13]
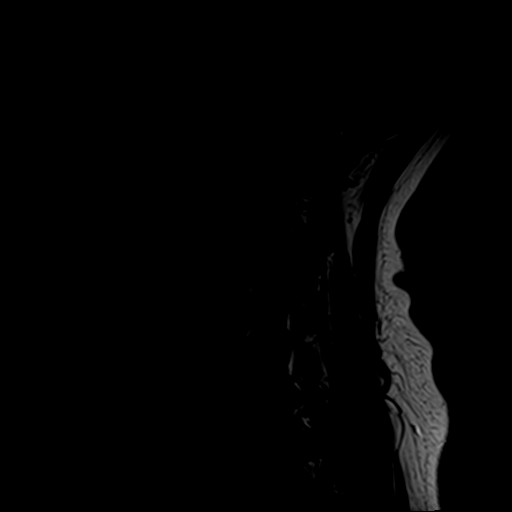
[im 13/13]
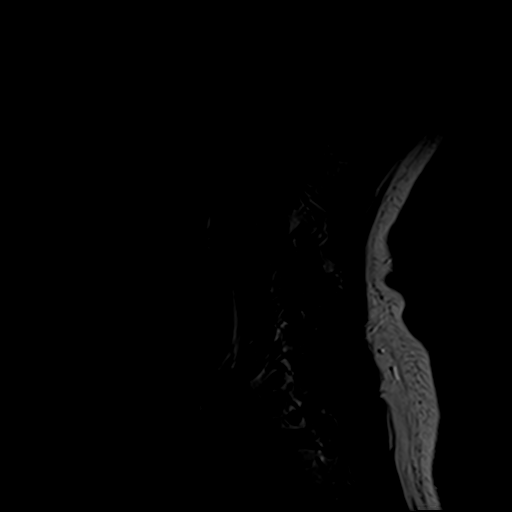

[Series 3: T1 · sagittal · 3.0mm · 0.41mm/px · 7 of 13 slices shown]
[im 1/13]
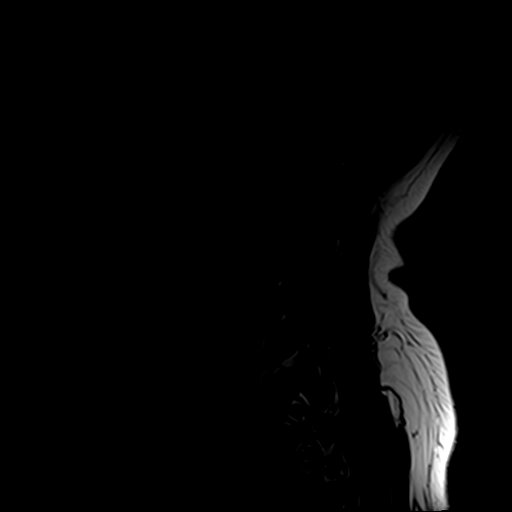
[im 3/13]
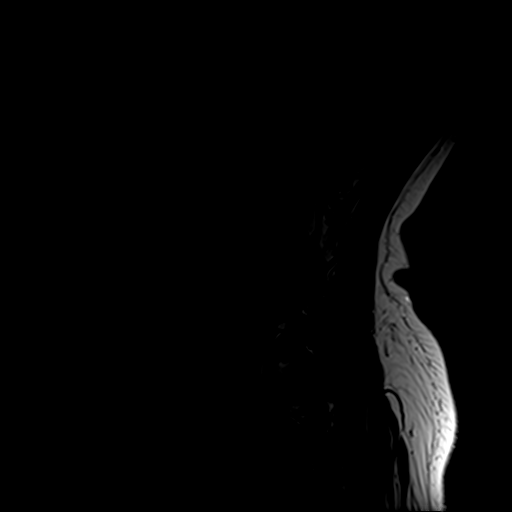
[im 5/13]
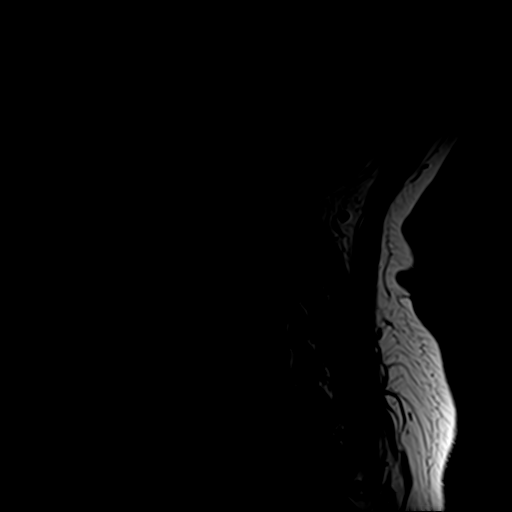
[im 7/13]
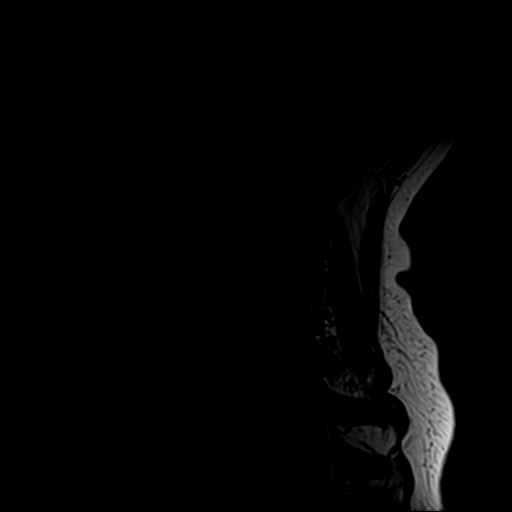
[im 9/13]
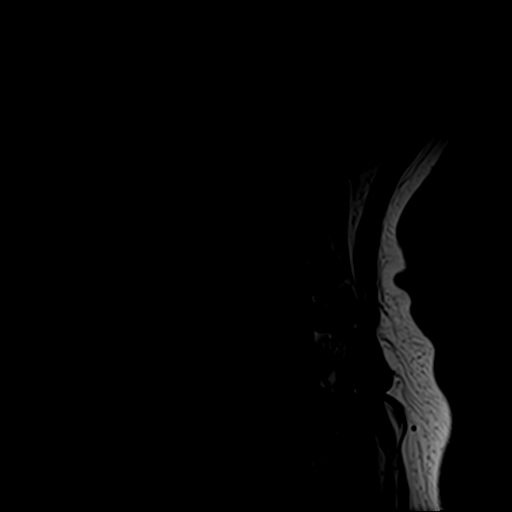
[im 11/13]
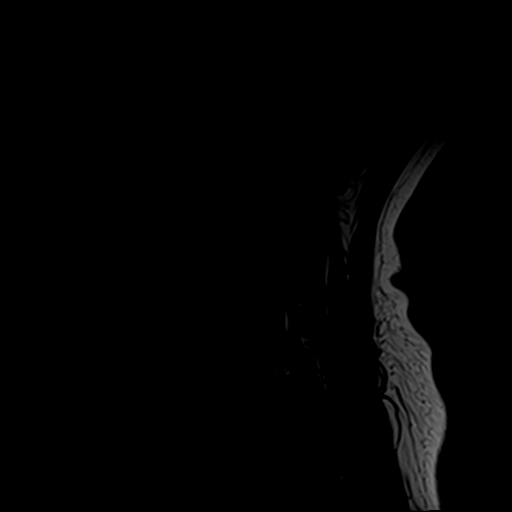
[im 13/13]
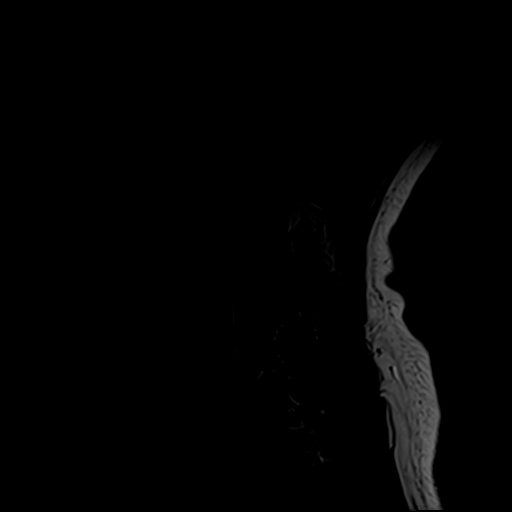

[Series 4: STIR · sagittal · 3.0mm · 0.82mm/px · 7 of 13 slices shown]
[im 1/13]
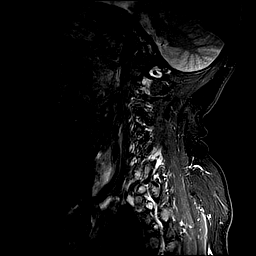
[im 3/13]
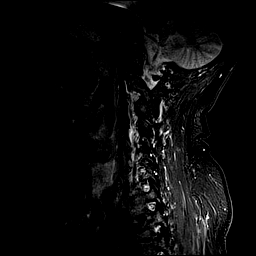
[im 5/13]
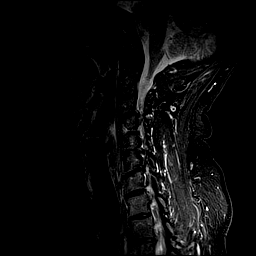
[im 7/13]
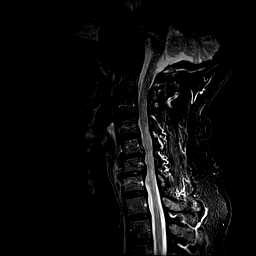
[im 9/13]
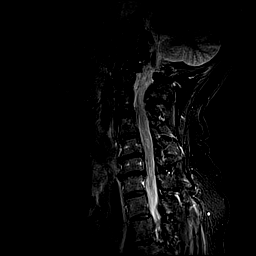
[im 11/13]
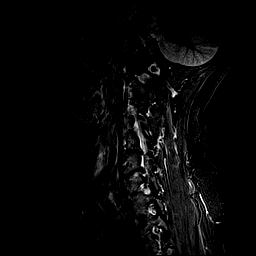
[im 13/13]
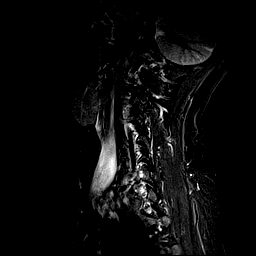

[Series 5: GRE · axial · 3.0mm · 0.35mm/px · 1 of 26 slices shown]
[im 1/26]
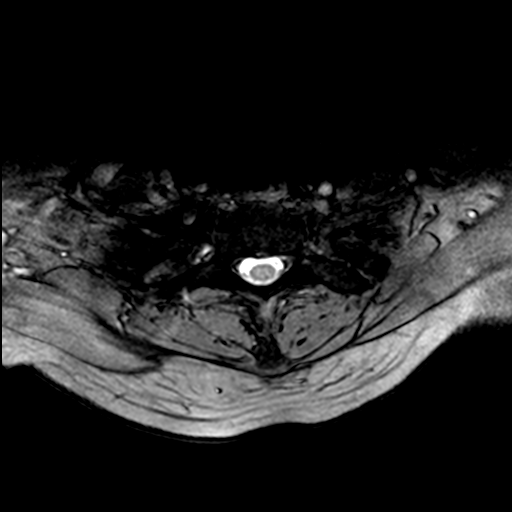

[Series 6: T2 · axial · 3.0mm · 0.70mm/px · z∈[-77,+15]mm · 8 of 26 slices shown (2 of 2)]
[im 1/26]
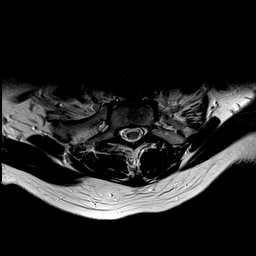
[im 4/26]
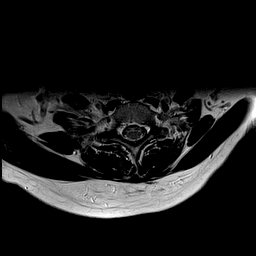
[im 8/26]
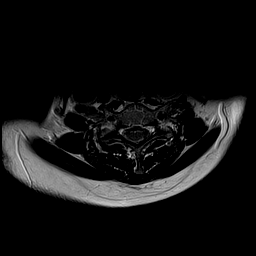
[im 12/26]
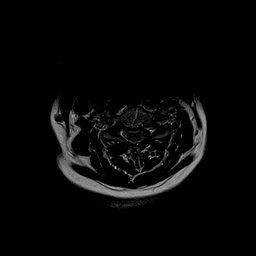
[im 14/26]
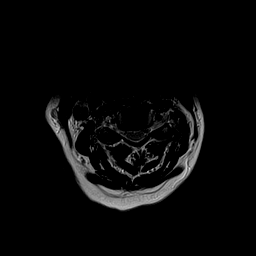
[im 18/26]
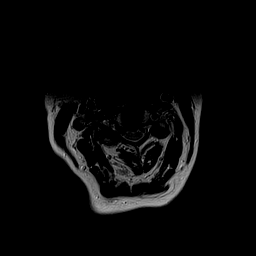
[im 22/26]
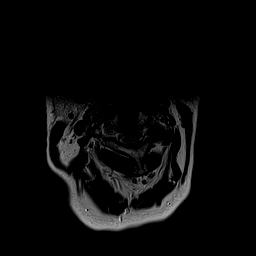
[im 26/26]
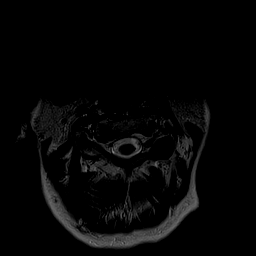

[29 of 48 positions shown; findings below may reference images not displayed]

FINDINGS: Alignment: Mild levocurvature.  Borderline C4-5 anterolisthesis.

Vertebrae: No fracture, evidence of discitis, or bone lesion.

Cord: Normal signal and morphology.

Posterior Fossa, vertebral arteries, paraspinal tissues: Negative

Disc levels:

C2-3: Facet spurring asymmetric to the left.  No impingement

C3-4: Right-sided uncovertebral and facet spurring with right
foraminal impingement. Small central disc protrusion which contacts
the ventral cord.

C4-5: Facet spurring asymmetric to the left. The canal and foramina
are patent

C5-6: Disc narrowing with uncovertebral ridging on the right more
than left. Posterior disc osteophyte complex with mild spinal
stenosis effacing ventral and dorsal CSF. Right more than left
foraminal impingement

C6-7: Disc narrowing and bulging with uncovertebral spurring. Left
foraminal impingement and mild spinal stenosis.

C7-T1:Small central disc protrusion.  No impingement.
IMPRESSION: 1. No evidence of cord plaque.
2. Disc and facet degeneration that causes foraminal impingement on
the right at C3-4, bilaterally at C5-6, and on the left at C6-7.
3. Mild spinal stenosis at C5-6 and C6-7.

## 2018-07-11 ENCOUNTER — Ambulatory Visit (INDEPENDENT_AMBULATORY_CARE_PROVIDER_SITE_OTHER): Payer: No Typology Code available for payment source

## 2018-07-11 DIAGNOSIS — J309 Allergic rhinitis, unspecified: Secondary | ICD-10-CM

## 2018-07-21 ENCOUNTER — Ambulatory Visit (INDEPENDENT_AMBULATORY_CARE_PROVIDER_SITE_OTHER): Payer: No Typology Code available for payment source | Admitting: *Deleted

## 2018-07-21 DIAGNOSIS — J309 Allergic rhinitis, unspecified: Secondary | ICD-10-CM | POA: Diagnosis not present

## 2018-07-28 ENCOUNTER — Ambulatory Visit (INDEPENDENT_AMBULATORY_CARE_PROVIDER_SITE_OTHER): Payer: No Typology Code available for payment source | Admitting: *Deleted

## 2018-07-28 DIAGNOSIS — J309 Allergic rhinitis, unspecified: Secondary | ICD-10-CM

## 2018-08-04 ENCOUNTER — Ambulatory Visit (INDEPENDENT_AMBULATORY_CARE_PROVIDER_SITE_OTHER): Payer: No Typology Code available for payment source

## 2018-08-04 DIAGNOSIS — J309 Allergic rhinitis, unspecified: Secondary | ICD-10-CM | POA: Diagnosis not present

## 2018-08-09 ENCOUNTER — Other Ambulatory Visit: Payer: Self-pay

## 2018-08-09 ENCOUNTER — Telehealth: Payer: Self-pay | Admitting: Family Medicine

## 2018-08-09 DIAGNOSIS — G35 Multiple sclerosis: Secondary | ICD-10-CM

## 2018-08-09 NOTE — Telephone Encounter (Signed)
See note  Copied from [place] [phone]. Topic: Referral - Request for Referral >> Aug 09, 2018 10:59 AM Leward Quan A wrote: Has patient seen PCP for this complaint? Yes.   *If NO, is insurance requiring patient see PCP for this issue before PCP can refer them? Referral for which specialty: Opthalmologic  Preferred provider/office: Any Reason for referral: Patient has MS and is having issues with one of her eyes. Please contact patient ASAP

## 2018-08-09 NOTE — Telephone Encounter (Signed)
Called patient let her know that referral has been placed. My chart message sent with information to contact them

## 2018-08-11 ENCOUNTER — Ambulatory Visit (INDEPENDENT_AMBULATORY_CARE_PROVIDER_SITE_OTHER): Payer: No Typology Code available for payment source | Admitting: *Deleted

## 2018-08-11 DIAGNOSIS — J309 Allergic rhinitis, unspecified: Secondary | ICD-10-CM | POA: Diagnosis not present

## 2018-08-11 MED ORDER — EPINEPHRINE 0.3 MG/0.3ML IJ SOAJ: 0.3000 mg | Freq: Once | INTRAMUSCULAR | 1 refills | Status: AC

## 2018-08-15 DIAGNOSIS — J301 Allergic rhinitis due to pollen: Secondary | ICD-10-CM | POA: Diagnosis not present

## 2018-08-15 NOTE — Progress Notes (Signed)
VIALS EXP 08-15-2019

## 2018-08-17 ENCOUNTER — Other Ambulatory Visit: Payer: Self-pay | Admitting: Family Medicine

## 2018-08-18 ENCOUNTER — Ambulatory Visit (INDEPENDENT_AMBULATORY_CARE_PROVIDER_SITE_OTHER): Payer: No Typology Code available for payment source | Admitting: *Deleted

## 2018-08-18 DIAGNOSIS — J309 Allergic rhinitis, unspecified: Secondary | ICD-10-CM | POA: Diagnosis not present

## 2018-08-18 DIAGNOSIS — M542 Cervicalgia: Secondary | ICD-10-CM | POA: Insufficient documentation

## 2018-08-25 ENCOUNTER — Telehealth: Payer: Self-pay | Admitting: *Deleted

## 2018-08-25 ENCOUNTER — Ambulatory Visit (INDEPENDENT_AMBULATORY_CARE_PROVIDER_SITE_OTHER): Payer: No Typology Code available for payment source | Admitting: *Deleted

## 2018-08-25 DIAGNOSIS — J309 Allergic rhinitis, unspecified: Secondary | ICD-10-CM

## 2018-08-25 NOTE — Telephone Encounter (Signed)
Patient came in today to get her injection. Informed patient that she is starting her 3rd Red vial and would be able to go to every 2 weeks soon. Patient stated she did not want to go to every 2 weeks because she did not even feel like the shots are working. Informed patient to make an appointment with Dr. Ernst Bowler to discuss her concerns over her vials.

## 2018-09-01 ENCOUNTER — Encounter: Payer: Self-pay | Admitting: Allergy & Immunology

## 2018-09-01 ENCOUNTER — Ambulatory Visit (INDEPENDENT_AMBULATORY_CARE_PROVIDER_SITE_OTHER): Payer: No Typology Code available for payment source | Admitting: Allergy & Immunology

## 2018-09-01 ENCOUNTER — Other Ambulatory Visit: Payer: Self-pay

## 2018-09-01 VITALS — BP 130/78 | HR 68 | Temp 97.8°F | Resp 18

## 2018-09-01 DIAGNOSIS — J302 Other seasonal allergic rhinitis: Secondary | ICD-10-CM

## 2018-09-01 DIAGNOSIS — J3089 Other allergic rhinitis: Secondary | ICD-10-CM

## 2018-09-01 DIAGNOSIS — H1013 Acute atopic conjunctivitis, bilateral: Secondary | ICD-10-CM

## 2018-09-01 NOTE — Patient Instructions (Addendum)
1. Seasonal and perennial allergic rhinitis (ragweed, grasses, indoor molds and outdoor molds) - We are going to get some blood work to see if you have developed new allergic sensitizations. - We will contact you in 1-2 weeks with the results of the testing. - We will plan to increase your pollens in your next vial to provide some more protection.  - We will keep your shots at every week. - Samples of eye drops provided (use each for a week and then let us know which one you like most). - You can also use lubricating eye drops throughout the day as well (samples provided). - Continue with: antihistamine daily, fluticasone one spray per nostril up to twice daily, and nasal saline rinses 1-2 times daily   2. Return in about 1 year (around 09/01/2019). This can be an in-person, a virtual Webex or a telephone follow up visit.   Please inform us of any Emergency Department visits, hospitalizations, or changes in symptoms. Call us before going to the ED for breathing or allergy symptoms since we might be able to fit you in for a sick visit. Feel free to contact us anytime with any questions, problems, or concerns.  It was a pleasure to see you again today!   Websites that have reliable patient information: 1. American Academy of Asthma, Allergy, and Immunology: www.aaaai.org 2. Food Allergy Research and Education (FARE): foodallergy.org 3. Mothers of Asthmatics: http://www.asthmacommunitynetwork.org 4. American College of Allergy, Asthma, and Immunology: www.acaai.org  "Like" [place] on Facebook and Instagram for our latest updates!      Make sure you are registered to vote! If you have moved or changed any of your contact information, you will need to get this updated before voting!  In some cases, you MAY be able to register to vote online: [email or web address]    Voter ID laws are NOT going into effect for the General Election in November 2020! DO NOT let this stop  you from exercising your right to vote!   Absentee voting is the SAFEST way to vote during the coronavirus pandemic!   Download and print an absentee ballot request form at [email or web address] or you can scan the QR code below with your smart phone:      More information on absentee ballots can be found here: [email or web address]

## 2018-09-01 NOTE — Progress Notes (Signed)
FOLLOW UP  Date of Service/Encounter:  09/01/18   Assessment:   Seasonal and perennial allergic rhinitis (ragweed, grasses, indoor molds and outdoor molds)  Allergic rhinoconjunctivitis   Patient has been on allergy shots for nearly a year at this point with some improvement but not as much as she would have anticipated.  We are going to get some blood work to see if she has developed new sensitizations.  We certainly could reconsider skin testing her, however as we did this less than a year ago, I am unsure whether it be covered.  We are going to increase her pollen in the next vial but we will wait for the blood work before putting that order in.  We are going to move her to weekly injections in perpetuity for now.  We are also given her samples of an eyedrop and if this works we can send this in to her pharmacy.  Plan/Recommendations:   1. Seasonal and perennial allergic rhinitis (ragweed, grasses, indoor molds and outdoor molds) - We are going to get some blood work to see if you have developed new allergic sensitizations. - We will contact you in 1-2 weeks with the results of the testing. - We will plan to increase your pollens in your next vial to provide some more protection.  - We will keep your shots at every week. - Samples of eye drops provided (use each for a week and then let us know which one you like most). - You can also use lubricating eye drops throughout the day as well (samples provided). - Continue with: antihistamine daily, fluticasone one spray per nostril up to twice daily, and nasal saline rinses 1-2 times daily   2. Return in about 1 year (around 09/01/2019). This can be an in-person, a virtual Webex or a telephone follow up visit.  Subjective:   Emily Wolfe is a 63 y.o. female presenting today for follow up of  Chief Complaint  Patient presents with  . Immunotherapy    patient has concerns about her injections. She feels that the injections are not  working that well and would like to discuss contining them.     Annina Ropp has a history of the following: Patient Active Problem List   Diagnosis Date Noted  . Cervicalgia 08/18/2018  . Cough variant asthma vs UACS 01/03/2018  . History of total hysterectomy, due to ovarian cancer 12/26/2017  . Genital warts 11/19/2017  . History of ovarian cancer 11/19/2017  . Seasonal and perennial allergic rhinitis, followed by Allergist, treated with saline rinses and allergy shots 11/02/2017  . Mild intermittent asthma without complication 0000000  . Hyperlipidemia 06/29/2017  . Hypertension 06/29/2017  . Multiple sclerosis ([place]), followed by Dr. Gerald Leitz, seen twice yearly, on Fingolimod 06/29/2017  . Scoliosis of lumbar spine 06/29/2017  . 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 Vitamin D deficiency 06/29/2017  . 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 Lung mass 06/29/2017  . Overweight (BMI 25.0-29.9) 06/29/2017  . Colon abnormality 05/11/2017    History obtained from: chart review and patient.  Emily Wolfe is a 63 y.o. female presenting for a follow up visit.  She was last seen in November 2019.  She underwent testing and was positive to ragweed, grasses, and indoor and outdoor molds.  We continue nasal saline rinses and she decided to start allergy shots.  She was having some shortness of breath that improved since I seen her earlier.  Since  the last visit, she has mostly been well.  She does feel like the shots are helping her.  However, she continues to need a nose spray as well as an antihistamine.  She is also having worsening ocular pruritus.  She has seen an ophthalmologist and had a normal exam.  Ophthalmologist felt that the symptoms are related to uncontrolled allergic disease.  She has been getting her shots on a weekly basis and now she is about to space out every 2 weeks.  However, there was one time when we are closed for a holiday and  she had to go 10 days between shots and could not tolerate it.  She is very nervous about going out to every 2 weeks.  She has had some medication changes since we last saw her.  She was on gabapentin and baclofen before for muscle spasms and tingling.  She has been changed to oxcarbamazepine with better control of her symptoms.    Otherwise, there have been no changes to her past medical history, surgical history, family history, or social history.    Review of Systems  Constitutional: Negative.  Negative for chills, fever, malaise/fatigue and weight loss.  HENT: Negative.  Negative for congestion, ear discharge, ear pain and sore throat.   Eyes: Positive for blurred vision, discharge and redness. Negative for pain.  Respiratory: Negative for cough, sputum production, shortness of breath, wheezing and stridor.   Cardiovascular: Negative.  Negative for chest pain and palpitations.  Gastrointestinal: Negative for abdominal pain, constipation, diarrhea, heartburn, nausea and vomiting.  Skin: Negative.  Negative for itching and rash.  Neurological: Positive for tingling. Negative for dizziness and headaches.  Endo/Heme/Allergies: Negative for environmental allergies. Does not bruise/bleed easily.       Objective:   Blood pressure 130/78, pulse 68, temperature 97.8 F (36.6 C), temperature source Temporal, resp. rate 18, SpO2 98 %. There is no height or weight on file to calculate BMI.   Physical Exam:  Physical Exam  Constitutional: She appears well-developed.  Pleasant female.  HENT:  Head: Normocephalic and atraumatic.  Right Ear: Tympanic membrane, external ear and ear canal normal.  Left Ear: Tympanic membrane, external ear and ear canal normal.  Nose: Rhinorrhea present. No mucosal edema, nasal deformity or septal deviation. No epistaxis. Right sinus exhibits no maxillary sinus tenderness and no frontal sinus tenderness. Left sinus exhibits no maxillary sinus tenderness and no  frontal sinus tenderness.  Mouth/Throat: Uvula is midline and oropharynx is clear and moist. Mucous membranes are not pale and not dry.  Cobblestoning in the posterior oropharynx.  Eyes: Pupils are equal, round, and reactive to light. EOM are normal. Right eye exhibits no chemosis and no discharge. Left eye exhibits no chemosis and no discharge. Left eye foreign body: No Horner-Trantas dots. Right conjunctiva is injected. Left conjunctiva is injected.  New order to try to stop.  Cardiovascular: Normal rate, regular rhythm and normal heart sounds.  Respiratory: Effort normal and breath sounds normal. No accessory muscle usage. No tachypnea. No respiratory distress. She has no wheezes. She has no rhonchi. She has no rales. She exhibits no tenderness.  Lymphadenopathy:    She has no cervical adenopathy.  Neurological: She is alert.  Skin: No abrasion, no petechiae and no rash noted. Rash is not papular, not vesicular and not urticarial. No erythema. No pallor.  Psychiatric: She has a normal mood and affect.     Diagnostic studies: labs sent instead     Salvatore Marvel, MD  [facility]

## 2018-09-02 ENCOUNTER — Ambulatory Visit (INDEPENDENT_AMBULATORY_CARE_PROVIDER_SITE_OTHER): Payer: No Typology Code available for payment source

## 2018-09-02 DIAGNOSIS — J309 Allergic rhinitis, unspecified: Secondary | ICD-10-CM

## 2018-09-04 LAB — IGE+ALLERGENS ZONE 2(30)
Alternaria Alternata IgE: <0.1 kU/L
Amer Sycamore IgE Qn: <0.1 kU/L
Aspergillus Fumigatus IgE: <0.1 kU/L
Bahia Grass IgE: <0.1 kU/L
Bermuda Grass IgE: <0.1 kU/L
Cat Dander IgE: <0.1 kU/L
Cedar, Mountain IgE: <0.1 kU/L
Cladosporium Herbarum IgE: <0.1 kU/L
Cockroach, American IgE: <0.1 kU/L
Common Silver Birch IgE: <0.1 kU/L
D Farinae IgE: <0.1 kU/L
D Pteronyssinus IgE: <0.1 kU/L
Dog Dander IgE: <0.1 kU/L
Elm, American IgE: <0.1 kU/L
Hickory, White IgE: <0.1 kU/L
IgE (Immunoglobulin E), Serum: 5 IU/mL — ABNORMAL LOW (ref 6–495)
Johnson Grass IgE: <0.1 kU/L
Maple/Box Elder IgE: <0.1 kU/L
Mucor Racemosus IgE: <0.1 kU/L
Mugwort IgE Qn: <0.1 kU/L
Nettle IgE: <0.1 kU/L
Oak, White IgE: <0.1 kU/L
Penicillium Chrysogen IgE: <0.1 kU/L
Pigweed, Rough IgE: <0.1 kU/L
Plantain, English IgE: <0.1 kU/L
Ragweed, Short IgE: 0.21 kU/L — AB
Sheep Sorrel IgE Qn: <0.1 kU/L
Stemphylium Herbarum IgE: <0.1 kU/L
Sweet gum IgE RAST Ql: <0.1 kU/L
Timothy Grass IgE: <0.1 kU/L
White Mulberry IgE: <0.1 kU/L

## 2018-09-04 LAB — ALLERGEN PROFILE, MOLD
Aureobasidi Pullulans IgE: <0.1 kU/L
Candida Albicans IgE: <0.1 kU/L
M009-IgE Fusarium proliferatum: <0.1 kU/L
M014-IgE Epicoccum purpur: <0.1 kU/L
Phoma Betae IgE: <0.1 kU/L
Setomelanomma Rostrat: <0.1 kU/L

## 2018-09-06 ENCOUNTER — Encounter: Payer: Self-pay | Admitting: Allergy & Immunology

## 2018-09-06 NOTE — Addendum Note (Signed)
Addended by: Valentina Shaggy on: 09/06/2018 09:25 AM   Modules accepted: Orders

## 2018-09-06 NOTE — Progress Notes (Signed)
New pollen vial written. Please start at the [facility] and increase on Schedule A instead of Schedule C.   Salvatore Marvel, MD [facility]

## 2018-09-08 ENCOUNTER — Ambulatory Visit (INDEPENDENT_AMBULATORY_CARE_PROVIDER_SITE_OTHER): Payer: No Typology Code available for payment source | Admitting: *Deleted

## 2018-09-08 DIAGNOSIS — J309 Allergic rhinitis, unspecified: Secondary | ICD-10-CM

## 2018-09-15 ENCOUNTER — Ambulatory Visit (INDEPENDENT_AMBULATORY_CARE_PROVIDER_SITE_OTHER): Payer: No Typology Code available for payment source | Admitting: *Deleted

## 2018-09-15 DIAGNOSIS — J309 Allergic rhinitis, unspecified: Secondary | ICD-10-CM | POA: Diagnosis not present

## 2018-09-22 ENCOUNTER — Other Ambulatory Visit: Payer: Self-pay

## 2018-09-22 DIAGNOSIS — Z20822 Contact with and (suspected) exposure to covid-19: Secondary | ICD-10-CM

## 2018-09-24 LAB — NOVEL CORONAVIRUS, NAA: SARS-CoV-2, NAA: NOT DETECTED

## 2018-09-26 ENCOUNTER — Other Ambulatory Visit: Payer: Self-pay

## 2018-09-26 ENCOUNTER — Ambulatory Visit (HOSPITAL_COMMUNITY)
Admission: EM | Admit: 2018-09-26 | Discharge: 2018-09-26 | Disposition: A | Discharge status: Home / Self Care | Payer: No Typology Code available for payment source | Attending: Family Medicine | Admitting: Family Medicine

## 2018-09-26 ENCOUNTER — Ambulatory Visit: Payer: No Typology Code available for payment source | Admitting: Family Medicine

## 2018-09-26 ENCOUNTER — Ambulatory Visit (INDEPENDENT_AMBULATORY_CARE_PROVIDER_SITE_OTHER): Payer: No Typology Code available for payment source

## 2018-09-26 ENCOUNTER — Telehealth: Payer: Self-pay | Admitting: Family Medicine

## 2018-09-26 ENCOUNTER — Encounter (HOSPITAL_COMMUNITY): Payer: Self-pay

## 2018-09-26 DIAGNOSIS — H6121 Impacted cerumen, right ear: Secondary | ICD-10-CM | POA: Diagnosis not present

## 2018-09-26 DIAGNOSIS — J309 Allergic rhinitis, unspecified: Secondary | ICD-10-CM

## 2018-09-26 NOTE — Telephone Encounter (Signed)
Patient came in for her 1:20 appt on 09/26/18. Due to having missed her allergy shot she was experiencing allergy symptoms, a bit of a cough, some drainage, etc. Due to policy and per Joellen I informed pt that we would not be able to see her in the office and she would need to go to urgent care for her visit today. Patient was not pleased and mentioned that she should have lied about her symptoms because she felt confident it was only her allergies. I apologized to patient and provided her with the [facility] phone number and address.

## 2018-09-26 NOTE — Discharge Instructions (Signed)
We are unable to get the wax and possible Q-tip and out of the right ear so we are sending you to an ear nose and throat specialist  The left ear canal looks clear.

## 2018-09-26 NOTE — ED Triage Notes (Signed)
Pt presents with cotton q tip in both ears after attempting to clean ears this morning.

## 2018-09-26 NOTE — ED Notes (Signed)
Called ENT specialist [phone]

## 2018-09-26 NOTE — ED Provider Notes (Signed)
[place]    CSN: [id number] Arrival date & time: 09/26/18  1355      History   Chief Complaint Chief Complaint  Patient presents with  . Foreign Body in [place] is a 63 y.o. female.   This is the initial [facility] visit for this 63 year old woman with a foreign body in her ear.  Pt presents with cotton q tip in both ears after attempting to clean ears this morning.     Past Medical History:  Diagnosis Date  . Angio-edema   . Eczema   . Genital warts 11/19/2017  . 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 History of ovarian cancer 11/19/2017  . Hyperlipidemia 06/29/2017  . Hypertension 06/29/2017  . Malignant neoplastic disease ([place]) 06/29/2017  . Mild intermittent asthma without complication 0000000  . Multiple sclerosis ([place]) 06/29/2017  . Postmenopausal 06/29/2017  . Scoliosis of lumbar spine 06/29/2017  . Small bowel obstruction ([place]) 06/29/2017  . Urticaria   . 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 Vitamin D deficiency 06/29/2017    Patient Active Problem List   Diagnosis Date Noted  . Cervicalgia 08/18/2018  . Cough variant asthma vs UACS 01/03/2018  . History of total hysterectomy, due to ovarian cancer 12/26/2017  . Genital warts 11/19/2017  . History of ovarian cancer 11/19/2017  . Seasonal and perennial allergic rhinitis, followed by Allergist, treated with saline rinses and allergy shots 11/02/2017  . Mild intermittent asthma without complication 0000000  . Hyperlipidemia 06/29/2017  . Hypertension 06/29/2017  . Multiple sclerosis ([place]), followed by Dr. Gerald Leitz, seen twice yearly, on Fingolimod 06/29/2017  . Scoliosis of lumbar spine 06/29/2017  . 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 Vitamin D deficiency 06/29/2017  . 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 Lung mass 06/29/2017  . Overweight (BMI 25.0-29.9)  06/29/2017  . Colon abnormality 05/11/2017    Past Surgical History:  Procedure Laterality Date  . ADENOIDECTOMY    . CHOLECYSTECTOMY  2009  . HERNIA REPAIR    . HYSTEROTOMY  2008  . SINOSCOPY    . TONSILLECTOMY    . TYMPANOSTOMY TUBE PLACEMENT      OB History   No obstetric history on file.      Home Medications    Prior to Admission medications   Medication Sig Start Date End Date Taking? Authorizing Provider  Cholecalciferol (VITAMIN D3) 1.25 MG (50000 UT) CAPS TAKE 1 CAPSULE BY MOUTH EVERY 7 DAYS 12/07/17   [provider]  diazepam (VALIUM) 5 MG tablet Take 1 tablet (5 mg total) by mouth at bedtime as needed for anxiety. 06/13/18   Briscoe Deutscher, DO  diclofenac sodium (VOLTAREN) 1 % GEL diclofenac 1 % topical gel  APPLY 2 GRAM TO THE AFFECTED AREA(S) 2-3 TIMES PER DAY    [provider]  Fingolimod HCl (GILENYA PO) Take 1 tablet by mouth daily.    [provider]  losartan (COZAAR) 25 MG tablet TAKE 1 TABLET BY MOUTH EVERY DAY 08/18/18   Briscoe Deutscher, DO  OXcarbazepine (TRILEPTAL) 150 MG tablet Take 150 mg by mouth 2 (two) times daily. 07/22/18   [provider]    Family History Family History  Problem Relation Age of Onset  . Breast cancer Mother   . Cervical cancer Maternal Grandmother     Social History Social History   Tobacco Use  .  Smoking status: Never Smoker  . Smokeless tobacco: Never Used  Substance Use Topics  . Alcohol use: Not Currently  . Drug use: Not Currently     Allergies   Patient has no known allergies.   Review of Systems Review of Systems   Physical Exam Triage Vital Signs ED Triage Vitals [09/26/18 1412]  Enc Vitals Group     BP (!) 151/86     Pulse Rate 67     Resp 17     Temp 98.2 F (36.8 C)     Temp Source Oral     SpO2 100 %     Weight      Height      Head Circumference      Peak Flow      Pain Score 0     Pain Loc      Pain Edu?      Excl. in [place]?    No data found.   Updated Vital Signs BP (!) 151/86 (BP Location: Right Arm)   Pulse 67   Temp 98.2 F (36.8 C) (Oral)   Resp 17   SpO2 100%    Physical Exam Vitals signs and nursing note reviewed.  Constitutional:      General: She is not in acute distress.    Appearance: Normal appearance. She is normal weight. She is not ill-appearing.  HENT:     Head: Normocephalic and atraumatic.     Left Ear: Tympanic membrane normal.     Ears:     Comments: Left ear canal looks fine Right ear canal has moderate cerumen and possible cotton swab  We attempted to irrigate the right canal but were unsuccessful in removing all the wax and patient became intolerant to further attempts. Eyes:     Conjunctiva/sclera: Conjunctivae normal.  Neck:     Musculoskeletal: Normal range of motion and neck supple.  Pulmonary:     Effort: Pulmonary effort is normal.  Musculoskeletal: Normal range of motion.  Skin:    General: Skin is warm and dry.  Neurological:     General: No focal deficit present.     Mental Status: She is alert and oriented to person, place, and time.  Psychiatric:        Mood and Affect: Mood normal.      UC Treatments / Results  Labs (all labs ordered are listed, but only abnormal results are displayed) Labs Reviewed - No data to display  EKG   Radiology No results found.  Procedures Procedures (including critical care time)  Medications Ordered in UC Medications - No data to display  Initial Impression / Assessment and Plan / UC Course  I have reviewed the triage vital signs and the nursing notes.  Pertinent labs & imaging results that were available during my care of the patient were reviewed by me and considered in my medical decision making (see chart for details).    Final Clinical Impressions(s) / UC Diagnoses   Final diagnoses:  Impacted cerumen of right ear     Discharge Instructions     We are unable to get the wax and possible Q-tip and out of the right ear  so we are sending you to an ear nose and throat specialist  The left ear canal looks clear.    ED Prescriptions    None     I have reviewed the PDMP during this encounter.   Robyn Haber, MD 09/26/18 1501

## 2018-09-26 NOTE — Telephone Encounter (Signed)
FYI

## 2018-10-05 ENCOUNTER — Ambulatory Visit (INDEPENDENT_AMBULATORY_CARE_PROVIDER_SITE_OTHER): Payer: No Typology Code available for payment source

## 2018-10-05 DIAGNOSIS — J309 Allergic rhinitis, unspecified: Secondary | ICD-10-CM | POA: Diagnosis not present

## 2018-10-13 ENCOUNTER — Ambulatory Visit (INDEPENDENT_AMBULATORY_CARE_PROVIDER_SITE_OTHER): Payer: No Typology Code available for payment source | Admitting: *Deleted

## 2018-10-13 DIAGNOSIS — J309 Allergic rhinitis, unspecified: Secondary | ICD-10-CM

## 2018-10-25 ENCOUNTER — Ambulatory Visit (INDEPENDENT_AMBULATORY_CARE_PROVIDER_SITE_OTHER): Payer: No Typology Code available for payment source

## 2018-10-25 DIAGNOSIS — J309 Allergic rhinitis, unspecified: Secondary | ICD-10-CM

## 2018-10-26 NOTE — Progress Notes (Signed)
VIALS EXP 10-26-19

## 2018-10-27 DIAGNOSIS — J301 Allergic rhinitis due to pollen: Secondary | ICD-10-CM | POA: Diagnosis not present

## 2018-11-02 ENCOUNTER — Ambulatory Visit (INDEPENDENT_AMBULATORY_CARE_PROVIDER_SITE_OTHER): Payer: No Typology Code available for payment source

## 2018-11-02 DIAGNOSIS — J309 Allergic rhinitis, unspecified: Secondary | ICD-10-CM

## 2018-11-09 ENCOUNTER — Other Ambulatory Visit: Payer: Self-pay | Admitting: Family Medicine

## 2018-11-10 ENCOUNTER — Ambulatory Visit (INDEPENDENT_AMBULATORY_CARE_PROVIDER_SITE_OTHER): Payer: No Typology Code available for payment source

## 2018-11-10 DIAGNOSIS — J309 Allergic rhinitis, unspecified: Secondary | ICD-10-CM

## 2018-11-16 ENCOUNTER — Ambulatory Visit (INDEPENDENT_AMBULATORY_CARE_PROVIDER_SITE_OTHER): Payer: No Typology Code available for payment source | Admitting: *Deleted

## 2018-11-16 DIAGNOSIS — J309 Allergic rhinitis, unspecified: Secondary | ICD-10-CM

## 2018-11-23 ENCOUNTER — Ambulatory Visit (INDEPENDENT_AMBULATORY_CARE_PROVIDER_SITE_OTHER): Payer: No Typology Code available for payment source

## 2018-11-23 DIAGNOSIS — J309 Allergic rhinitis, unspecified: Secondary | ICD-10-CM

## 2018-11-30 ENCOUNTER — Ambulatory Visit (INDEPENDENT_AMBULATORY_CARE_PROVIDER_SITE_OTHER): Payer: No Typology Code available for payment source | Admitting: *Deleted

## 2018-11-30 DIAGNOSIS — J309 Allergic rhinitis, unspecified: Secondary | ICD-10-CM

## 2018-12-07 ENCOUNTER — Telehealth: Payer: Self-pay | Admitting: *Deleted

## 2018-12-07 NOTE — Telephone Encounter (Signed)
Error

## 2018-12-08 ENCOUNTER — Ambulatory Visit (INDEPENDENT_AMBULATORY_CARE_PROVIDER_SITE_OTHER): Payer: No Typology Code available for payment source | Admitting: *Deleted

## 2018-12-08 DIAGNOSIS — J309 Allergic rhinitis, unspecified: Secondary | ICD-10-CM

## 2018-12-19 ENCOUNTER — Ambulatory Visit (INDEPENDENT_AMBULATORY_CARE_PROVIDER_SITE_OTHER): Payer: No Typology Code available for payment source

## 2018-12-19 DIAGNOSIS — J309 Allergic rhinitis, unspecified: Secondary | ICD-10-CM

## 2018-12-20 ENCOUNTER — Ambulatory Visit (INDEPENDENT_AMBULATORY_CARE_PROVIDER_SITE_OTHER): Payer: No Typology Code available for payment source | Admitting: Family Medicine

## 2018-12-20 DIAGNOSIS — G35 Multiple sclerosis: Secondary | ICD-10-CM | POA: Diagnosis not present

## 2018-12-20 DIAGNOSIS — F419 Anxiety disorder, unspecified: Secondary | ICD-10-CM

## 2018-12-20 DIAGNOSIS — E559 Vitamin D deficiency, unspecified: Secondary | ICD-10-CM | POA: Diagnosis not present

## 2018-12-20 DIAGNOSIS — J302 Other seasonal allergic rhinitis: Secondary | ICD-10-CM

## 2018-12-20 DIAGNOSIS — J3089 Other allergic rhinitis: Secondary | ICD-10-CM | POA: Diagnosis not present

## 2018-12-20 DIAGNOSIS — I10 Essential (primary) hypertension: Secondary | ICD-10-CM

## 2018-12-20 MED ORDER — LOSARTAN POTASSIUM 25 MG PO TABS: 25.0000 mg | ORAL_TABLET | Freq: Every day | ORAL | 3 refills | Status: DC

## 2018-12-20 NOTE — Progress Notes (Signed)
    Chief Complaint:  Emily Wolfe is a 63 y.o. female who presents today for a virtual office visit with a chief complaint of HTN.   Assessment/Plan:  Vitamin D deficiency Continue replacement. Check Vitamin D with next blood draw.   Multiple sclerosis ([place]), followed by Dr. Gerald Leitz, seen twice yearly, on Fingolimod Continue management per neurology.   Hypertension Refilled losartan 25mg  daily. Continue home monitoring with goal 140/90 daily.   Seasonal and perennial allergic rhinitis, followed by Allergist, treated with saline rinses and allergy shots Stable. Continue management per neurology.   Anxiety Stable. Continue valium 5mg  daily as needed.      Subjective:  HPI:  Her stable, chronic medical conditions are outlined below:  # Anxiety - Uses valium 5mg  as needed  # Essential Hypertension - On losartan 25mg  daily and tolerating well  % MS - [facility]rology at [facility] - On fingolimod and tolerating well - On trileptal 150mg  twice daily  % Vitamin D Deficiency  - On 50000 IU weekly  % Seasonal Allergies - Follows with allergy - On nasal saline and allergy shots  % Asthma - Follows with pulmonology  ROS: Per HPI  PMH: She reports that she has never smoked. She has never used smokeless tobacco. She reports previous alcohol use. She reports previous drug use.      Objective/Observations  Physical Exam: Gen: NAD, resting comfortably Pulm: Normal work of breathing Neuro: Grossly normal, moves all extremities Psych: Normal affect and thought content  Virtual Visit via Video   I connected with Emily Wolfe on 12/20/18 at 11:40 AM EST by a video enabled telemedicine application and verified that I am speaking with the correct person using two identifiers. The limitations of evaluation and management by telemedicine and the availability of in person appointments were discussed. The patient expressed understanding and agreed to proceed.    Patient location: Home Provider location: [place] participating in the virtual visit: Myself and Patient     Algis Greenhouse. Jerline Pain, MD 12/20/2018 10:54 AM

## 2018-12-20 NOTE — Assessment & Plan Note (Signed)
Continue replacement. Check Vitamin D with next blood draw.

## 2018-12-20 NOTE — Assessment & Plan Note (Signed)
Continue management per neurology. 

## 2018-12-20 NOTE — Assessment & Plan Note (Signed)
Stable.  Continue management per neurology. 

## 2018-12-20 NOTE — Assessment & Plan Note (Signed)
Refilled losartan 25mg  daily. Continue home monitoring with goal 140/90 daily.

## 2018-12-20 NOTE — Assessment & Plan Note (Signed)
Stable. Continue valium 5mg  daily as needed.

## 2018-12-23 ENCOUNTER — Other Ambulatory Visit: Payer: Self-pay

## 2018-12-23 ENCOUNTER — Encounter: Payer: Self-pay | Admitting: Family Medicine

## 2018-12-23 ENCOUNTER — Encounter: Payer: No Typology Code available for payment source | Admitting: Family Medicine

## 2018-12-23 NOTE — Progress Notes (Signed)
This encounter was created in error - please disregard.  This encounter was created in error - please disregard.

## 2018-12-28 ENCOUNTER — Ambulatory Visit (INDEPENDENT_AMBULATORY_CARE_PROVIDER_SITE_OTHER): Payer: No Typology Code available for payment source | Admitting: *Deleted

## 2018-12-28 DIAGNOSIS — J309 Allergic rhinitis, unspecified: Secondary | ICD-10-CM | POA: Diagnosis not present

## 2019-01-04 ENCOUNTER — Ambulatory Visit (INDEPENDENT_AMBULATORY_CARE_PROVIDER_SITE_OTHER): Payer: No Typology Code available for payment source | Admitting: *Deleted

## 2019-01-04 DIAGNOSIS — J309 Allergic rhinitis, unspecified: Secondary | ICD-10-CM | POA: Diagnosis not present

## 2019-01-13 ENCOUNTER — Ambulatory Visit: Payer: No Typology Code available for payment source | Admitting: Family Medicine

## 2019-01-13 ENCOUNTER — Telehealth: Payer: Self-pay

## 2019-01-13 ENCOUNTER — Encounter: Payer: Self-pay | Admitting: Family Medicine

## 2019-01-13 ENCOUNTER — Other Ambulatory Visit: Payer: Self-pay

## 2019-01-13 VITALS — BP 136/84 | HR 66 | Temp 97.7°F | Resp 18 | Ht 66.0 in

## 2019-01-13 DIAGNOSIS — H1013 Acute atopic conjunctivitis, bilateral: Secondary | ICD-10-CM

## 2019-01-13 DIAGNOSIS — J3089 Other allergic rhinitis: Secondary | ICD-10-CM

## 2019-01-13 DIAGNOSIS — J302 Other seasonal allergic rhinitis: Secondary | ICD-10-CM

## 2019-01-13 MED ORDER — AZELASTINE HCL 0.1 % NA SOLN: 2.0000 | Freq: Two times a day (BID) | NASAL | 5 refills | Status: DC

## 2019-01-13 NOTE — Telephone Encounter (Signed)
Ambulatory referral for ENT for chronic Rhinitis despite aggressive medical treatment including Allergen Immunotherapy.

## 2019-01-13 NOTE — Progress Notes (Signed)
[place] [address] Dept: [phone]  FOLLOW UP NOTE  Patient ID: Emily Wolfe, female    DOB: October 31, 1955  Age: 64 y.o. MRN: [id number] Date of Office Visit: 01/13/2019  Assessment  Chief Complaint: Allergy Testing  HPI Emily Wolfe is a 64 year old female who presetns to the clinic for a follow up visit. She was last seen in this clinic on 09/01/2018 by Dr. Ernst Bowler for evaluation of allergic rhinitiis on allergen immunotherapy. At today's visit, she reports that she has not taken any antihistamines for the last 3 days and has had a significant increase in clear rhinorrhea, nasal congestion, and thick post nasal drainage. She continues allergen immunotherapy with no adverse reactions, though she is unsure if these injections are providing significant relief of allergic rhinitis symptoms. Her current medications are listed in the chart.    Drug Allergies:  No Known Allergies  Physical Exam: BP 136/84   Pulse 66   Temp 97.7 F (36.5 C) (Temporal)   Resp 18   Ht 5\' 6"  (1.676 m)   SpO2 99%   BMI 29.54 kg/m    Physical Exam Vitals reviewed.  Constitutional:      Appearance: Normal appearance.  HENT:     Head: Normocephalic and atraumatic.     Right Ear: Tympanic membrane normal.     Left Ear: Tympanic membrane normal.     Nose:     Comments: Bilateral nares slightly erythematous with clear nasal drainage noted. Pharynx normal. Ears normal. Eyes normal.    Mouth/Throat:     Pharynx: Oropharynx is clear.  Eyes:     Conjunctiva/sclera: Conjunctivae normal.  Cardiovascular:     Rate and Rhythm: Normal rate and regular rhythm.     Heart sounds: Normal heart sounds. No murmur.  Pulmonary:     Effort: Pulmonary effort is normal.     Breath sounds: Normal breath sounds.     Comments: Lungs clear to auscultation Musculoskeletal:        General: Normal range of motion.     Cervical back: Normal range of motion and neck supple.  Skin:  General: Skin is warm and dry.  Neurological:     Mental Status: She is alert and oriented to person, place, and time.  Psychiatric:        Mood and Affect: Mood normal.        Behavior: Behavior normal.        Thought Content: Thought content normal.        Judgment: Judgment normal.     Diagnostics: Percutaneous environmental skin testing was positive to ragweed with adequate controls.   Intradermal skin testing was positive to mold mix 2 with adequate controls.   Assessment and Plan: 1. Seasonal and perennial allergic rhinitis   2. Allergic conjunctivitis of both eyes     Meds ordered this encounter  Medications  . azelastine (ASTELIN) 0.1 % nasal spray    Sig: Place 2 sprays into both nostrils 2 (two) times daily.    Dispense:  30 mL    Refill:  5    Patient Instructions  Allergic rhinitis mixed with non-allergic rhinitis Your skin testing was positive to ragweed and mold Avoidance measures are listed below Continue allergen immunotherapy once a week and have access to an epinephrine auto-injector set  Continue Flonase 2 sprays in each nostril once a day as needed for a stuffy nose Begin Astelin nasal spray 2 sprays in each nostril twice a day  as needed for nasal symptoms Consider saline nasal rinses as needed for nasal symptoms. Use this before any medicated nasal sprays for best result Continue an antihistamine once a day as needed for a runny nose. Remember to rotate to a different antihistamine about every 3 months. Some examples of over the counter antihistamines include Zyrtec (cetirizine), Xyzal (levocetirizine), Allegra (fexofenadine), and Claritin (loratidine).  Recommend evaluation with ENT specialist for evaluation and treamtnet  Call the clinic if this treatment plan is not working well for you  Follow up in 6 months or sooner if needed.    Return in about 6 months (around 07/13/2019), or if symptoms worsen or fail to improve.    Thank you for the  opportunity to care for this patient.  Please do not hesitate to contact me with questions.  Gareth Morgan, FNP [facility]

## 2019-01-13 NOTE — Patient Instructions (Addendum)
Allergic rhinitis mixed with non-allergic rhinitis Your skin testing was positive to ragweed and mold Avoidance measures are listed below Continue allergen immunotherapy once a week and have access to an epinephrine auto-injector set  Continue Flonase 2 sprays in each nostril once a day as needed for a stuffy nose Begin Astelin nasal spray 2 sprays in each nostril twice a day as needed for nasal symptoms Consider saline nasal rinses as needed for nasal symptoms. Use this before any medicated nasal sprays for best result Continue an antihistamine once a day as needed for a runny nose. Remember to rotate to a different antihistamine about every 3 months. Some examples of over the counter antihistamines include Zyrtec (cetirizine), Xyzal (levocetirizine), Allegra (fexofenadine), and Claritin (loratidine).  Recommend evaluation with ENT specialist for evaluation and treamtnet  Call the clinic if this treatment plan is not working well for you  Follow up in 6 months or sooner if needed.

## 2019-01-17 NOTE — Telephone Encounter (Signed)
I spoke with Dr Benjamine Mola and they are currently not doing new patients who have [place]. Currently [employer] is not reimbursing for NEW PATIENT appointments. I have called the patient and left a voicemail so I can give her this information.   Maybe we can refer her to [facility], [place] or [place].

## 2019-01-17 NOTE — Telephone Encounter (Signed)
Referral has been placed to Dr Benjamine Mola Per patient request. Patient did not want to go to [place].  Thanks

## 2019-01-18 ENCOUNTER — Ambulatory Visit (INDEPENDENT_AMBULATORY_CARE_PROVIDER_SITE_OTHER): Payer: No Typology Code available for payment source | Admitting: *Deleted

## 2019-01-18 DIAGNOSIS — J309 Allergic rhinitis, unspecified: Secondary | ICD-10-CM | POA: Diagnosis not present

## 2019-01-18 NOTE — Telephone Encounter (Signed)
Thank you :)

## 2019-01-23 NOTE — Progress Notes (Signed)
Exp 01/25/20

## 2019-01-25 DIAGNOSIS — J301 Allergic rhinitis due to pollen: Secondary | ICD-10-CM

## 2019-01-26 ENCOUNTER — Ambulatory Visit (INDEPENDENT_AMBULATORY_CARE_PROVIDER_SITE_OTHER): Payer: No Typology Code available for payment source

## 2019-01-26 DIAGNOSIS — J309 Allergic rhinitis, unspecified: Secondary | ICD-10-CM

## 2019-01-26 NOTE — Telephone Encounter (Signed)
Patient came in today stating she would rather go to [facility] in [place]. Referral has been placed to Dr Mila Homer in [facility].  Phone: [phone] Fax: [phone] or [phone]

## 2019-01-26 NOTE — Telephone Encounter (Signed)
Thank you Dee

## 2019-02-03 ENCOUNTER — Ambulatory Visit (INDEPENDENT_AMBULATORY_CARE_PROVIDER_SITE_OTHER): Payer: No Typology Code available for payment source | Admitting: *Deleted

## 2019-02-03 DIAGNOSIS — J309 Allergic rhinitis, unspecified: Secondary | ICD-10-CM

## 2019-02-10 ENCOUNTER — Ambulatory Visit (INDEPENDENT_AMBULATORY_CARE_PROVIDER_SITE_OTHER): Payer: No Typology Code available for payment source | Admitting: *Deleted

## 2019-02-10 DIAGNOSIS — J309 Allergic rhinitis, unspecified: Secondary | ICD-10-CM

## 2019-02-17 ENCOUNTER — Ambulatory Visit (INDEPENDENT_AMBULATORY_CARE_PROVIDER_SITE_OTHER): Payer: No Typology Code available for payment source | Admitting: *Deleted

## 2019-02-17 DIAGNOSIS — J309 Allergic rhinitis, unspecified: Secondary | ICD-10-CM

## 2019-02-24 ENCOUNTER — Ambulatory Visit (INDEPENDENT_AMBULATORY_CARE_PROVIDER_SITE_OTHER): Payer: No Typology Code available for payment source | Admitting: *Deleted

## 2019-02-24 DIAGNOSIS — J309 Allergic rhinitis, unspecified: Secondary | ICD-10-CM

## 2019-03-02 ENCOUNTER — Ambulatory Visit (INDEPENDENT_AMBULATORY_CARE_PROVIDER_SITE_OTHER): Payer: No Typology Code available for payment source

## 2019-03-02 DIAGNOSIS — J309 Allergic rhinitis, unspecified: Secondary | ICD-10-CM

## 2019-03-06 ENCOUNTER — Other Ambulatory Visit: Payer: Self-pay

## 2019-03-07 ENCOUNTER — Ambulatory Visit (INDEPENDENT_AMBULATORY_CARE_PROVIDER_SITE_OTHER): Payer: No Typology Code available for payment source | Admitting: Family Medicine

## 2019-03-07 VITALS — BP 122/72 | HR 65 | Temp 97.8°F | Ht 66.0 in | Wt 203.2 lb

## 2019-03-07 DIAGNOSIS — J45991 Cough variant asthma: Secondary | ICD-10-CM

## 2019-03-07 DIAGNOSIS — M542 Cervicalgia: Secondary | ICD-10-CM

## 2019-03-07 DIAGNOSIS — E7800 Pure hypercholesterolemia, unspecified: Secondary | ICD-10-CM | POA: Diagnosis not present

## 2019-03-07 DIAGNOSIS — I10 Essential (primary) hypertension: Secondary | ICD-10-CM

## 2019-03-07 DIAGNOSIS — F419 Anxiety disorder, unspecified: Secondary | ICD-10-CM

## 2019-03-07 DIAGNOSIS — M5090 Cervical disc disorder, unspecified, unspecified cervical region: Secondary | ICD-10-CM

## 2019-03-07 DIAGNOSIS — J4520 Mild intermittent asthma, uncomplicated: Secondary | ICD-10-CM

## 2019-03-07 DIAGNOSIS — J3089 Other allergic rhinitis: Secondary | ICD-10-CM

## 2019-03-07 DIAGNOSIS — G35 Multiple sclerosis: Secondary | ICD-10-CM

## 2019-03-07 DIAGNOSIS — F515 Nightmare disorder: Secondary | ICD-10-CM

## 2019-03-07 DIAGNOSIS — Z1159 Encounter for screening for other viral diseases: Secondary | ICD-10-CM

## 2019-03-07 DIAGNOSIS — J302 Other seasonal allergic rhinitis: Secondary | ICD-10-CM

## 2019-03-07 DIAGNOSIS — E559 Vitamin D deficiency, unspecified: Secondary | ICD-10-CM

## 2019-03-07 LAB — COMPREHENSIVE METABOLIC PANEL
ALT: 12 U/L (ref 0–35)
AST: 14 U/L (ref 0–37)
Albumin: 4.1 g/dL (ref 3.5–5.2)
Alkaline Phosphatase: 83 U/L (ref 39–117)
BUN: 21 mg/dL (ref 6–23)
CO2: 29 mEq/L (ref 19–32)
Calcium: 9.7 mg/dL (ref 8.4–10.5)
Chloride: 105 mEq/L (ref 96–112)
Creatinine, Ser: 0.78 mg/dL (ref 0.40–1.20)
GFR: 74.46 mL/min (ref >60.00)
Glucose, Bld: 95 mg/dL (ref 70–99)
Potassium: 4 mEq/L (ref 3.5–5.1)
Sodium: 140 mEq/L (ref 135–145)
Total Bilirubin: 0.4 mg/dL (ref 0.2–1.2)
Total Protein: 6.4 g/dL (ref 6.0–8.3)

## 2019-03-07 LAB — CBC
HCT: 39.7 % (ref 36.0–46.0)
Hemoglobin: 13.2 g/dL (ref 12.0–15.0)
MCHC: 33.4 g/dL (ref 30.0–36.0)
MCV: 88.5 fl (ref 78.0–100.0)
Platelets: 192 10*3/uL (ref 150.0–400.0)
RBC: 4.48 Mil/uL (ref 3.87–5.11)
RDW: 15.2 % (ref 11.5–15.5)
WBC: 4.2 10*3/uL (ref 4.0–10.5)

## 2019-03-07 LAB — LIPID PANEL
Cholesterol: 228 mg/dL — ABNORMAL HIGH (ref 0–200)
HDL: 63.5 mg/dL (ref >39.00)
LDL Cholesterol: 140 mg/dL — ABNORMAL HIGH (ref 0–99)
NonHDL: 164.24
Total CHOL/HDL Ratio: 4
Triglycerides: 123 mg/dL (ref 0.0–149.0)
VLDL: 24.6 mg/dL (ref 0.0–40.0)

## 2019-03-07 LAB — TSH: TSH: 3.3 u[IU]/mL (ref 0.35–4.50)

## 2019-03-07 MED ORDER — PRAZOSIN HCL 1 MG PO CAPS: 1.0000 mg | ORAL_CAPSULE | Freq: Every day | ORAL | 3 refills | Status: DC

## 2019-03-07 NOTE — Progress Notes (Signed)
   Emily Wolfe is a 64 y.o. female who presents today for an office visit.  Assessment/Plan:  Chronic Problems Addressed Today: Nightmares Possibly due to MS.  Trileptal also possibly playing a role.  Given that symptoms are very troublesome and interfering with sleep will start prazosin 1 mg nightly.  She will follow-up with me in a few weeks.  Anxiety Stable.  Continue Valium 5 mg as needed.  Cervicalgia No red flags.  Will place referral to neurosurgery.  Cough variant asthma vs UACS Stable.  Vitamin D deficiency Stable.  Continue vitamin D replacement.  Multiple sclerosis ([place]), followed by Dr. Gerald Leitz, seen twice yearly, on Fingolimod Stable.  No signs of flare.  Continue management per neurology.  Hypertension At goal.  Continue losartan 25 mg daily.  Check CBC, C met, TSH.  Hyperlipidemia Check lipid panel today.  Mild intermittent asthma without complication Stable.  Seasonal and perennial allergic rhinitis, followed by Allergist, treated with saline rinses and allergy shots Stable.  Continue management per allergist.     Subjective:  HPI:  Patient has been having persistent neck and bilateral arm pain for the past several months and going on a year.  She was initially seen at her orthopedics office.  They did not think her pain was consistent with musculoskeletal pain and she was referred back to her neurologist see if it was due to her MS.  Her MS reviewed her MRI that was performed on 07/05/2018 did not think that her pain was due to an MS flare.  She was started on a burst of prednisone which helped significantly with her symptoms.  She has had persistent pain since then.  She is not interested in surgery [name] would like to see a specialist to discuss treatment for her persistent neck and upper extremity pain.  She is also had persistent nightmares for the past several years.  This interferes with her ability to sleep.  Nightmares are usually consistent  with graphic and gory images.  They do not contain any past trauma or reliving of any experiences.       Objective:  Physical Exam: BP 122/72   Pulse 65   Temp 97.8 F (36.6 C)   Ht '5\' 6"'$  (1.676 m)   Wt 203 lb 4 oz (92.2 kg)   SpO2 99%   BMI 32.81 kg/m   Gen: No acute distress, resting comfortably CV: Regular rate and rhythm with no murmurs appreciated Pulm: Normal work of breathing, clear to auscultation bilaterally with no crackles, wheezes, or rhonchi Neuro: Grossly normal, moves all extremities Psych: Normal affect and thought content  Time Spent: 50 minutes of total time was spent on the date of the encounter performing the following actions: chart review prior to seeing the patient, obtaining history, performing a medically necessary exam, counseling on the treatment plan, placing orders, and documenting in our EHR.        Algis Greenhouse. Jerline Pain, MD 03/07/2019 10:12 AM

## 2019-03-07 NOTE — Assessment & Plan Note (Signed)
No red flags.  Will place referral to neurosurgery.

## 2019-03-07 NOTE — Patient Instructions (Addendum)
It was very nice to see you today!  Please start the prazosin.  We will send you to see a neurosurgeon for your neck.  We will check blood work today.   Take care, Dr Jerline Pain  Please try these tips to maintain a healthy lifestyle:   Eat at least 3 REAL meals and 1-2 snacks per day.  Aim for no more than 5 hours between eating.  If you eat breakfast, please do so within one hour of getting up.    Each meal should contain half fruits/vegetables, one quarter protein, and one quarter carbs (no bigger than a computer mouse)   Cut down on sweet beverages. This includes juice, soda, and sweet tea.     Drink at least 1 glass of water with each meal and aim for at least 8 glasses per day   Exercise at least 150 minutes every week.

## 2019-03-07 NOTE — Assessment & Plan Note (Signed)
Stable

## 2019-03-07 NOTE — Assessment & Plan Note (Signed)
Possibly due to MS.  Trileptal also possibly playing a role.  Given that symptoms are very troublesome and interfering with sleep will start prazosin 1 mg nightly.  She will follow-up with me in a few weeks.

## 2019-03-07 NOTE — Assessment & Plan Note (Signed)
At goal.  Continue losartan 25 mg daily.  Check CBC, C met, TSH.

## 2019-03-07 NOTE — Assessment & Plan Note (Signed)
Stable.  Continue Valium 5 mg as needed.

## 2019-03-07 NOTE — Assessment & Plan Note (Signed)
Check lipid panel today 

## 2019-03-07 NOTE — Assessment & Plan Note (Signed)
Stable.  No signs of flare.  Continue management per neurology.

## 2019-03-07 NOTE — Assessment & Plan Note (Signed)
Stable.  Continue vitamin D replacement.

## 2019-03-07 NOTE — Assessment & Plan Note (Signed)
Stable.  Continue management per allergist.  ?

## 2019-03-08 LAB — HEPATITIS C ANTIBODY
Hepatitis C Ab: NONREACTIVE
SIGNAL TO CUT-OFF: 0.09 (ref <1.00)

## 2019-03-09 ENCOUNTER — Ambulatory Visit (INDEPENDENT_AMBULATORY_CARE_PROVIDER_SITE_OTHER): Payer: No Typology Code available for payment source

## 2019-03-09 DIAGNOSIS — J309 Allergic rhinitis, unspecified: Secondary | ICD-10-CM

## 2019-03-13 NOTE — Progress Notes (Signed)
Please inform patient of the following:  Cholesterol levels are borderline but everything else is normal.  Do not need to start medications.  Would like for her to continue working on diet and exercise and we can recheck in a year.

## 2019-03-16 ENCOUNTER — Ambulatory Visit (INDEPENDENT_AMBULATORY_CARE_PROVIDER_SITE_OTHER): Payer: No Typology Code available for payment source

## 2019-03-16 DIAGNOSIS — J309 Allergic rhinitis, unspecified: Secondary | ICD-10-CM

## 2019-03-21 ENCOUNTER — Emergency Department (HOSPITAL_COMMUNITY): Payer: No Typology Code available for payment source

## 2019-03-21 ENCOUNTER — Inpatient Hospital Stay (HOSPITAL_COMMUNITY): Payer: No Typology Code available for payment source

## 2019-03-21 ENCOUNTER — Other Ambulatory Visit: Payer: Self-pay

## 2019-03-21 ENCOUNTER — Inpatient Hospital Stay (HOSPITAL_COMMUNITY)
Admission: EM | Admit: 2019-03-21 | Discharge: 2019-03-22 | DRG: 390 | Disposition: A | Discharge status: Home / Self Care | Payer: No Typology Code available for payment source | Attending: Internal Medicine | Admitting: Internal Medicine

## 2019-03-21 ENCOUNTER — Encounter (HOSPITAL_COMMUNITY): Payer: Self-pay | Admitting: Emergency Medicine

## 2019-03-21 DIAGNOSIS — Z9049 Acquired absence of other specified parts of digestive tract: Secondary | ICD-10-CM | POA: Diagnosis not present

## 2019-03-21 DIAGNOSIS — Z79899 Other long term (current) drug therapy: Secondary | ICD-10-CM | POA: Diagnosis not present

## 2019-03-21 DIAGNOSIS — F419 Anxiety disorder, unspecified: Secondary | ICD-10-CM | POA: Diagnosis present

## 2019-03-21 DIAGNOSIS — J4520 Mild intermittent asthma, uncomplicated: Secondary | ICD-10-CM | POA: Diagnosis present

## 2019-03-21 DIAGNOSIS — Z90722 Acquired absence of ovaries, bilateral: Secondary | ICD-10-CM | POA: Diagnosis not present

## 2019-03-21 DIAGNOSIS — Z20822 Contact with and (suspected) exposure to covid-19: Secondary | ICD-10-CM | POA: Diagnosis present

## 2019-03-21 DIAGNOSIS — Z8543 Personal history of malignant neoplasm of ovary: Secondary | ICD-10-CM

## 2019-03-21 DIAGNOSIS — I10 Essential (primary) hypertension: Secondary | ICD-10-CM | POA: Diagnosis present

## 2019-03-21 DIAGNOSIS — K56609 Unspecified intestinal obstruction, unspecified as to partial versus complete obstruction: Secondary | ICD-10-CM | POA: Diagnosis present

## 2019-03-21 DIAGNOSIS — Z791 Long term (current) use of non-steroidal anti-inflammatories (NSAID): Secondary | ICD-10-CM

## 2019-03-21 DIAGNOSIS — E785 Hyperlipidemia, unspecified: Secondary | ICD-10-CM | POA: Diagnosis present

## 2019-03-21 DIAGNOSIS — Z0189 Encounter for other specified special examinations: Secondary | ICD-10-CM

## 2019-03-21 DIAGNOSIS — Z90710 Acquired absence of both cervix and uterus: Secondary | ICD-10-CM

## 2019-03-21 DIAGNOSIS — G35 Multiple sclerosis: Secondary | ICD-10-CM | POA: Diagnosis present

## 2019-03-21 LAB — URINALYSIS, ROUTINE W REFLEX MICROSCOPIC
Bilirubin Urine: NEGATIVE
Glucose, UA: NEGATIVE mg/dL
Hgb urine dipstick: NEGATIVE
Ketones, ur: 20 mg/dL — AB
Leukocytes,Ua: NEGATIVE
Nitrite: NEGATIVE
Protein, ur: 30 mg/dL — AB
Specific Gravity, Urine: 1.018 (ref 1.005–1.030)
pH: 8 (ref 5.0–8.0)

## 2019-03-21 LAB — COMPREHENSIVE METABOLIC PANEL
ALT: 15 U/L (ref 0–44)
AST: 22 U/L (ref 15–41)
Albumin: 4.3 g/dL (ref 3.5–5.0)
Alkaline Phosphatase: 82 U/L (ref 38–126)
Anion gap: 13 (ref 5–15)
BUN: 11 mg/dL (ref 8–23)
CO2: 25 mmol/L (ref 22–32)
Calcium: 9.6 mg/dL (ref 8.9–10.3)
Chloride: 101 mmol/L (ref 98–111)
Creatinine, Ser: 0.86 mg/dL (ref 0.44–1.00)
GFR calc Af Amer: >60 mL/min (ref >60)
GFR calc non Af Amer: >60 mL/min (ref >60)
Glucose, Bld: 155 mg/dL — ABNORMAL HIGH (ref 70–99)
Potassium: 4 mmol/L (ref 3.5–5.1)
Sodium: 139 mmol/L (ref 135–145)
Total Bilirubin: 0.7 mg/dL (ref 0.3–1.2)
Total Protein: 6.7 g/dL (ref 6.5–8.1)

## 2019-03-21 LAB — CBC
HCT: 43 % (ref 36.0–46.0)
Hemoglobin: 13.9 g/dL (ref 12.0–15.0)
MCH: 29 pg (ref 26.0–34.0)
MCHC: 32.3 g/dL (ref 30.0–36.0)
MCV: 89.6 fL (ref 80.0–100.0)
Platelets: 240 10*3/uL (ref 150–400)
RBC: 4.8 MIL/uL (ref 3.87–5.11)
RDW: 14.2 % (ref 11.5–15.5)
WBC: 9.5 10*3/uL (ref 4.0–10.5)
nRBC: 0 % (ref 0.0–0.2)

## 2019-03-21 LAB — SARS CORONAVIRUS 2 (TAT 6-24 HRS): SARS Coronavirus 2: NEGATIVE

## 2019-03-21 LAB — LIPASE, BLOOD: Lipase: 20 U/L (ref 11–51)

## 2019-03-21 IMAGING — DX DG ABD PORTABLE 1V
1 series · 1 of 1 positions shown · non-contrast
Comparison: CT [DATE]

CLINICAL DATA: Nasogastric tube placement. SBO

EXAM:
PORTABLE ABDOMEN - 1 VIEW

[abdomen kub]
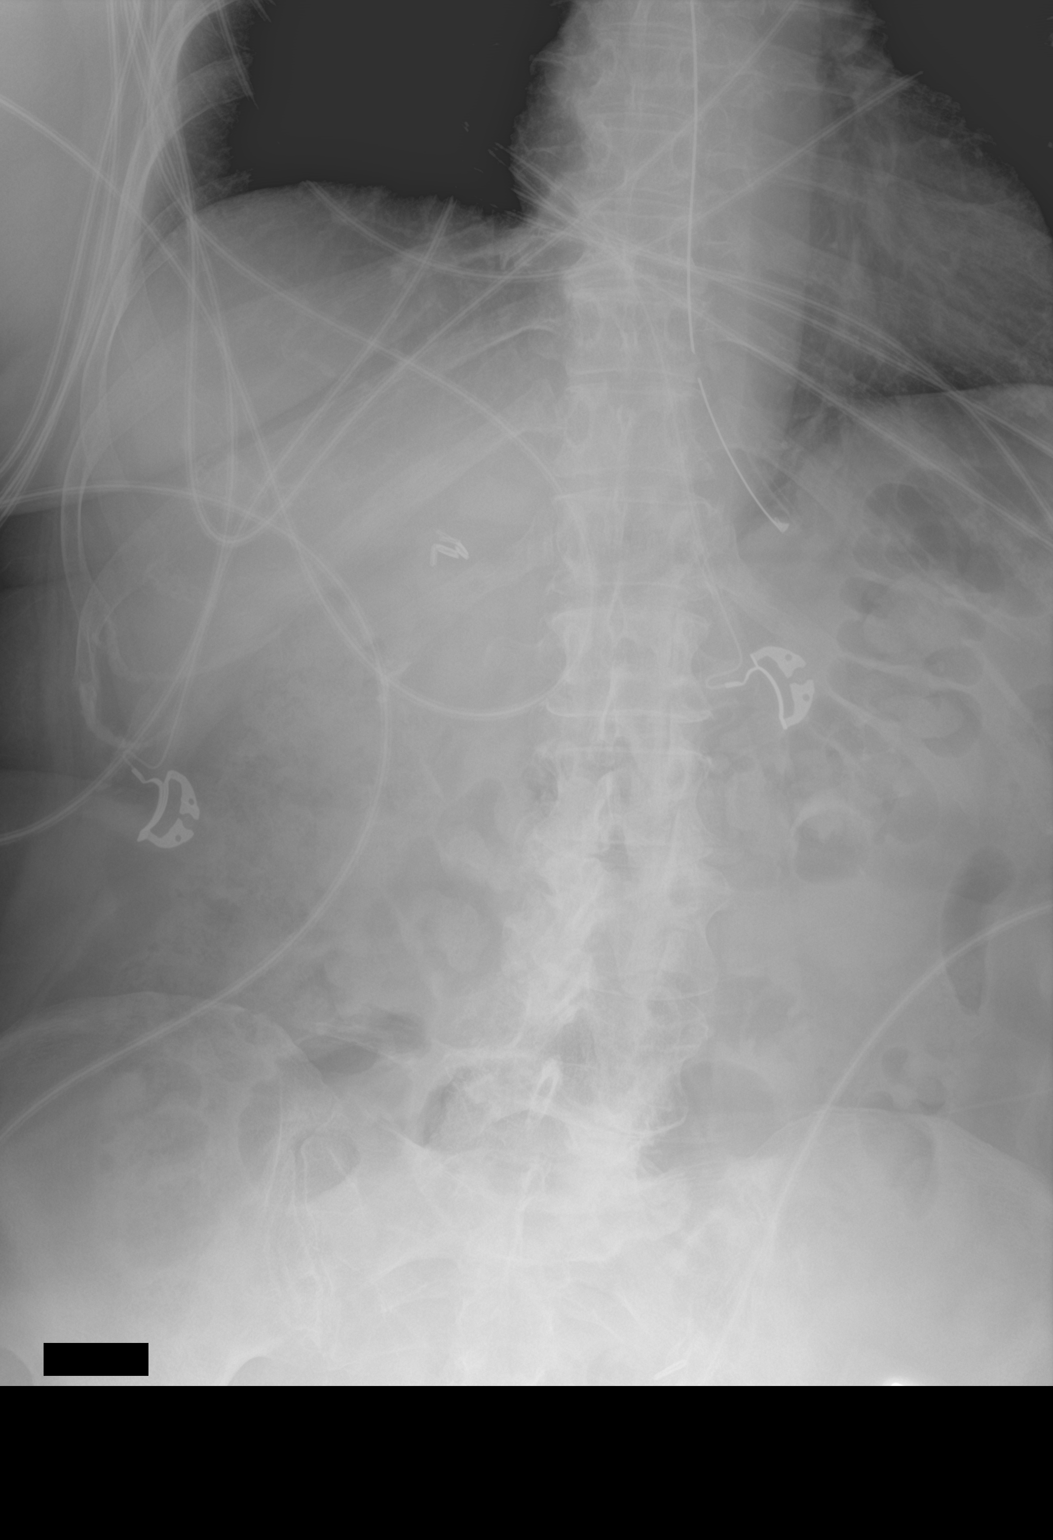

[1 of 1 positions shown; findings below may reference images not displayed]

FINDINGS: Nasogastric tube distal tip terminates just beyond the level of the
GE junction with side port in the distal esophagus. Dilated loops of
small bowel are partially visualized within the lower abdomen and
pelvis. Air and stool are noted throughout the colon.
IMPRESSION: Nasogastric tube distal tip terminates just beyond the level of the
GE junction with side port in the distal esophagus. Recommend
advancement 7-10 cm.

## 2019-03-21 IMAGING — CT CT ABD-PELV W/ CM
2 of 5 series · 16 of 46 positions shown, 18 images · IV contrast (APPLIED)
Comparison: None.

CLINICAL DATA: Abdominal pain, nausea, vomiting

EXAM:
CT ABDOMEN AND PELVIS WITH CONTRAST
TECHNIQUE: Multidetector CT imaging of the abdomen and pelvis was performed
using the standard protocol following bolus administration of
intravenous contrast.
CONTRAST:  100mL OMNIPAQUE IOHEXOL 300 MG/ML  SOLN

[Series 3: abdomen 5.0 · axial · 0.79mm/px · z∈[+644,+1050]mm · 13 of 93 slices shown, 15 images]
[im 6/93  soft-tissue]
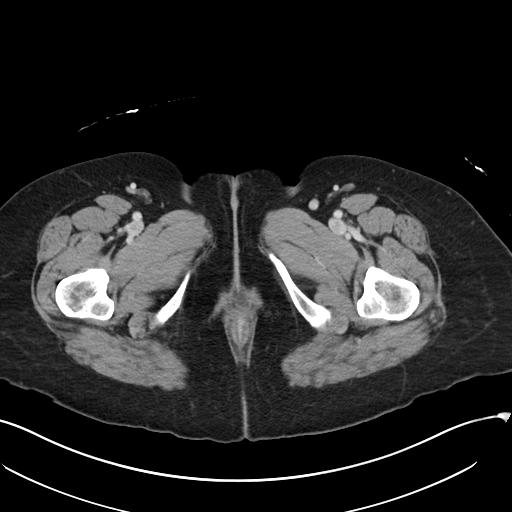
[im 6/93  bone]
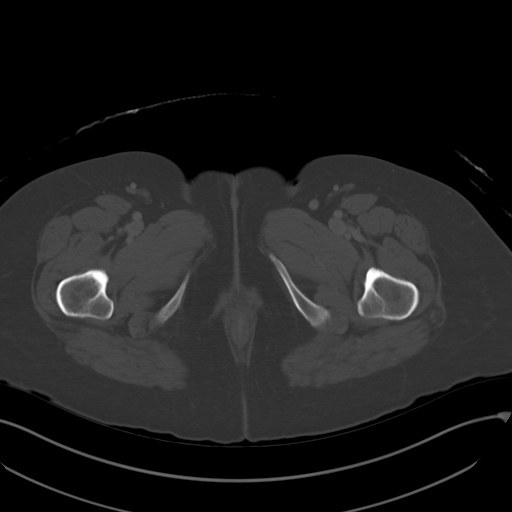
[im 12/93  soft-tissue]
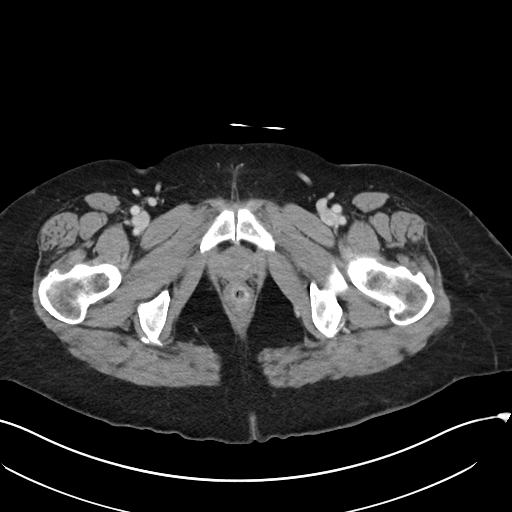
[im 18/93  soft-tissue]
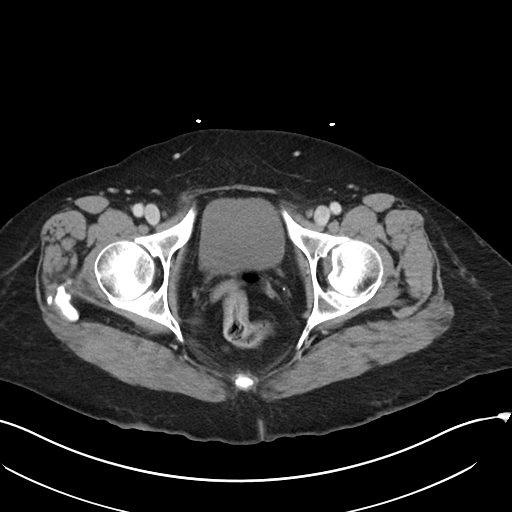
[im 29/93  soft-tissue]
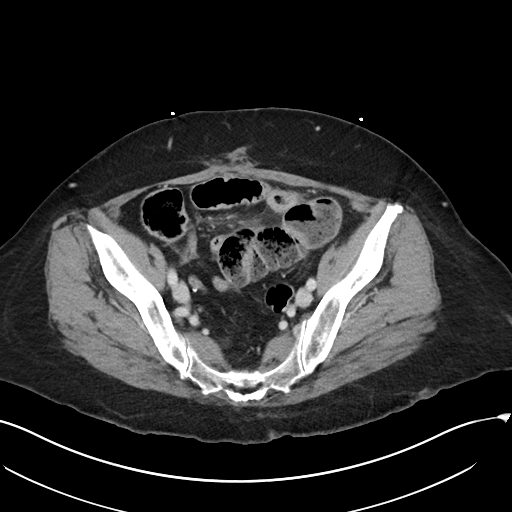
[im 35/93  soft-tissue]
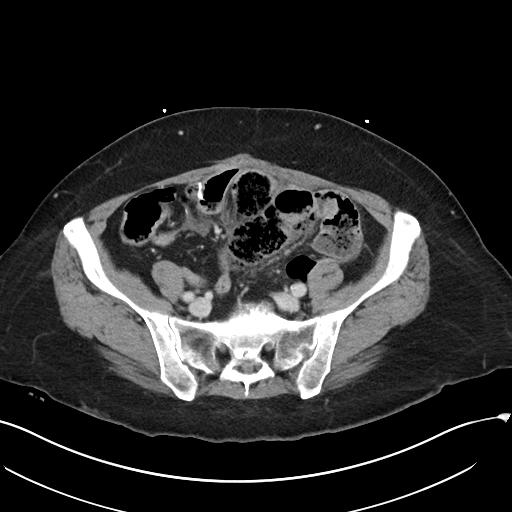
[im 41/93  soft-tissue]
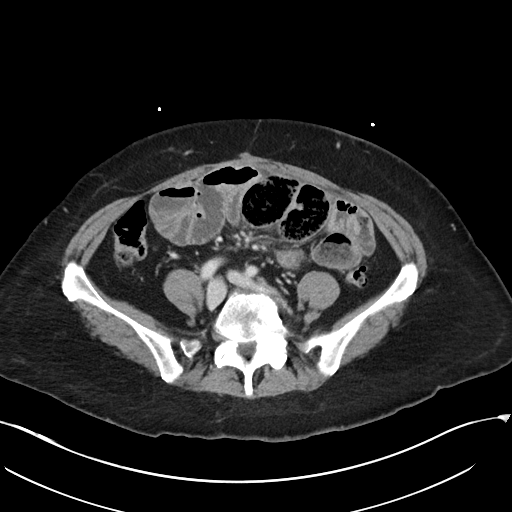
[im 47/93  soft-tissue]
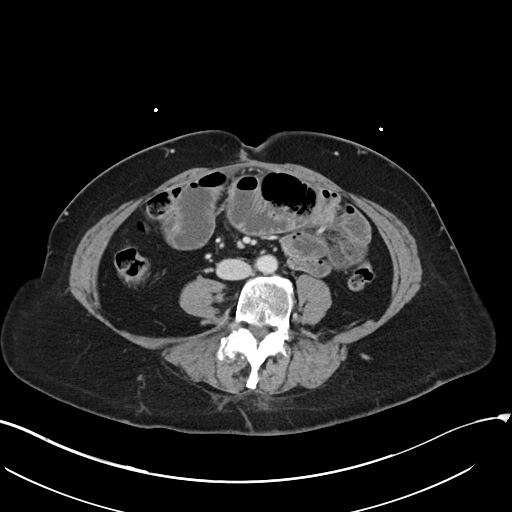
[im 52/93  soft-tissue]
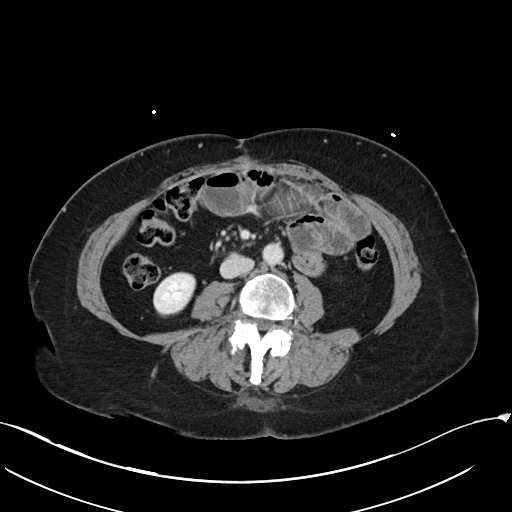
[im 58/93  soft-tissue]
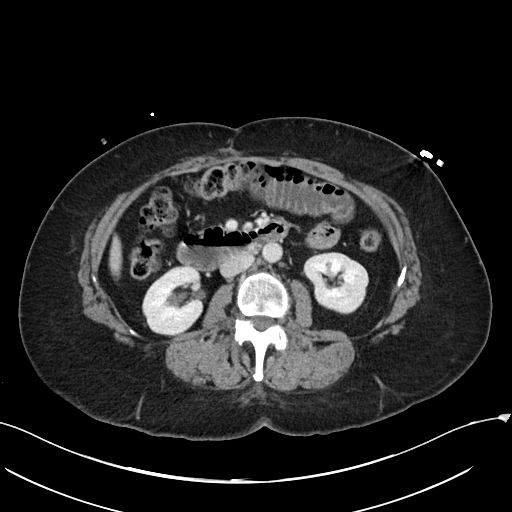
[im 58/93  bone]
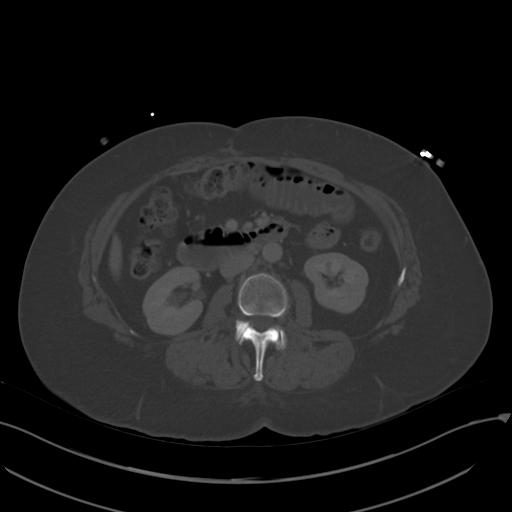
[im 64/93  soft-tissue]
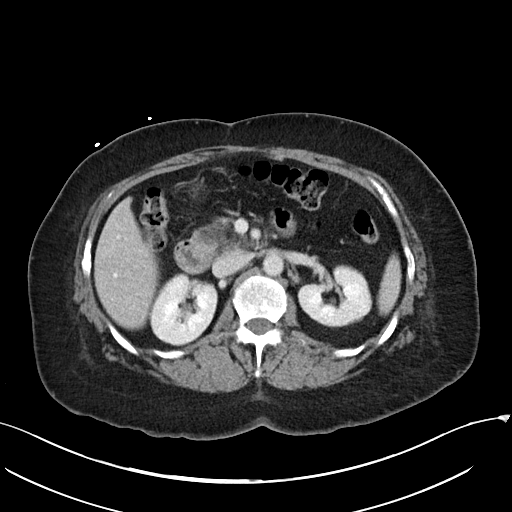
[im 75/93  soft-tissue]
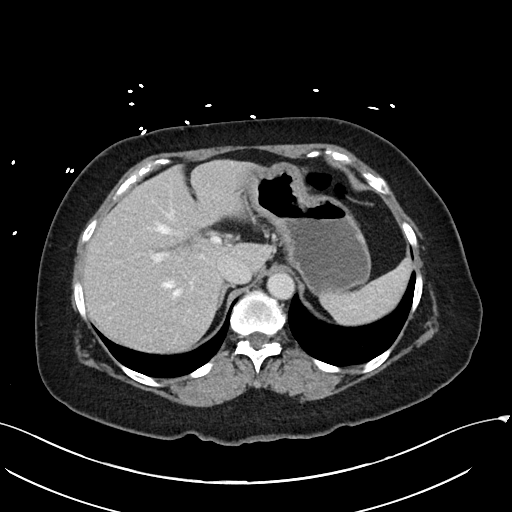
[im 81/93  soft-tissue]
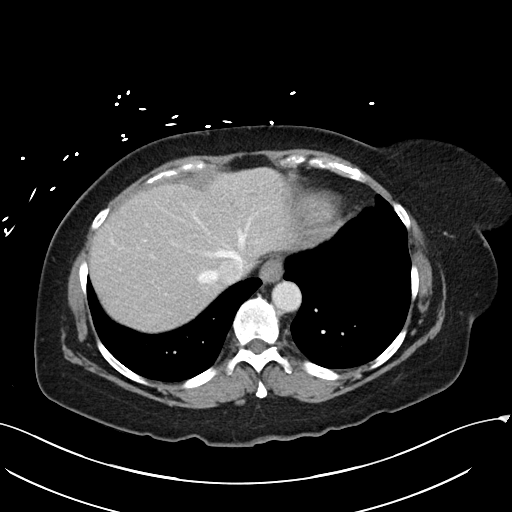
[im 87/93  soft-tissue]
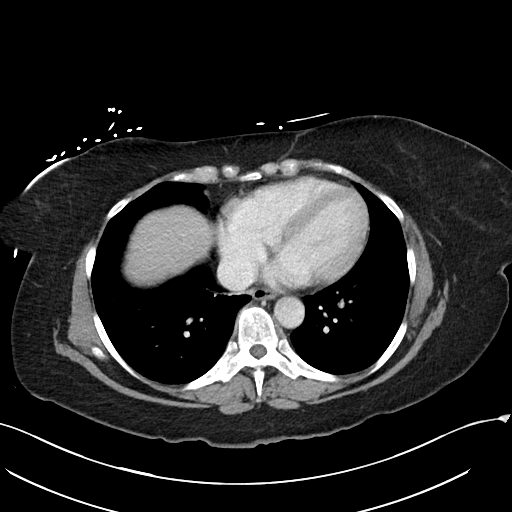

[Series 6: abdomen 3.0 mpr cor · coronal · 0.69mm/px · 3 of 92 slices shown]
[im 31/92  soft-tissue]
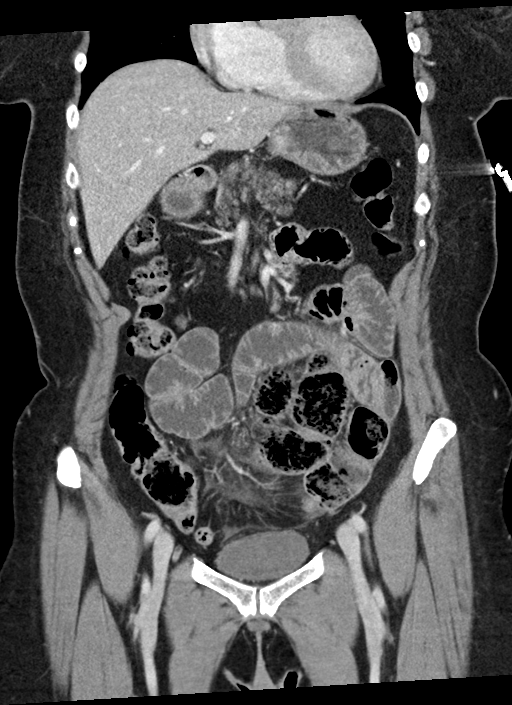
[im 41/92  soft-tissue]
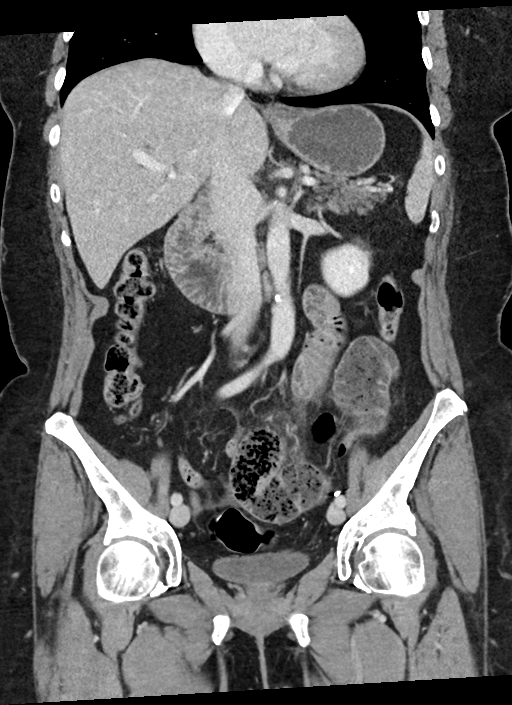
[im 51/92  soft-tissue]
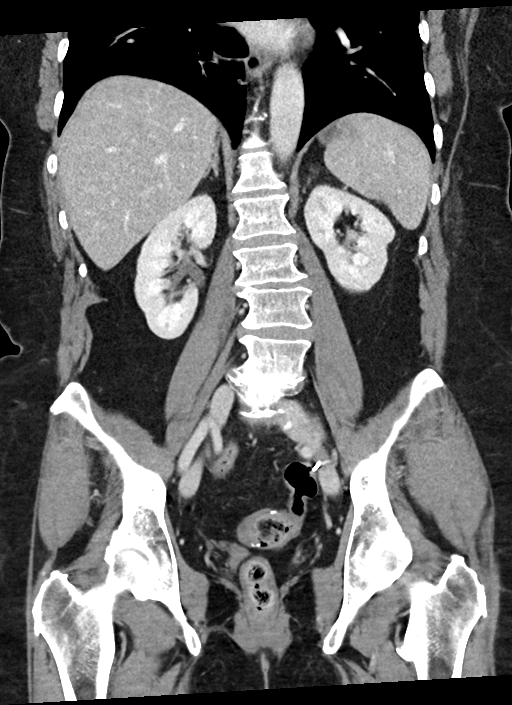

[16 of 46 positions shown; findings below may reference images not displayed]

FINDINGS: Lower chest: Lung bases are clear. No effusions. Heart is normal
size.

Hepatobiliary: No focal liver abnormality is seen. Status post
cholecystectomy. No biliary dilatation.

Pancreas: No focal abnormality or ductal dilatation.

Spleen: No focal abnormality.  Normal size.

Adrenals/Urinary Tract: No adrenal abnormality. No focal renal
abnormality. No stones or hydronephrosis. Urinary bladder is
unremarkable.

Stomach/Bowel: Postoperative changes in the sigmoid colon.
Postoperative changes also in distal small bowel loops. There are
dilated fluid-filled and fecalized small bowel loops in the pelvis.
Distal small bowel is decompressed. Findings compatible with distal
small bowel obstruction. Exact transition point not visualized.
Stomach and large bowel decompressed, unremarkable.

Vascular/Lymphatic: Aortic atherosclerosis. No enlarged abdominal or
pelvic lymph nodes.

Reproductive: Prior hysterectomy.  No adnexal masses.

Other: No free fluid or free air.

Musculoskeletal: No acute bony abnormality.
IMPRESSION: Dilated fluid-filled and fecalized small bowel with decompressed
distal small bowel. Findings compatible with distal small bowel
obstruction.

## 2019-03-21 IMAGING — DX DG ABD PORTABLE 1V
1 series · 1 of 1 positions shown · non-contrast
Comparison: [DATE] (acquired at 12.40 8 p.m.)

CLINICAL DATA: Nasogastric tube placement.

EXAM:
PORTABLE ABDOMEN - 1 VIEW

[abdomen kub]
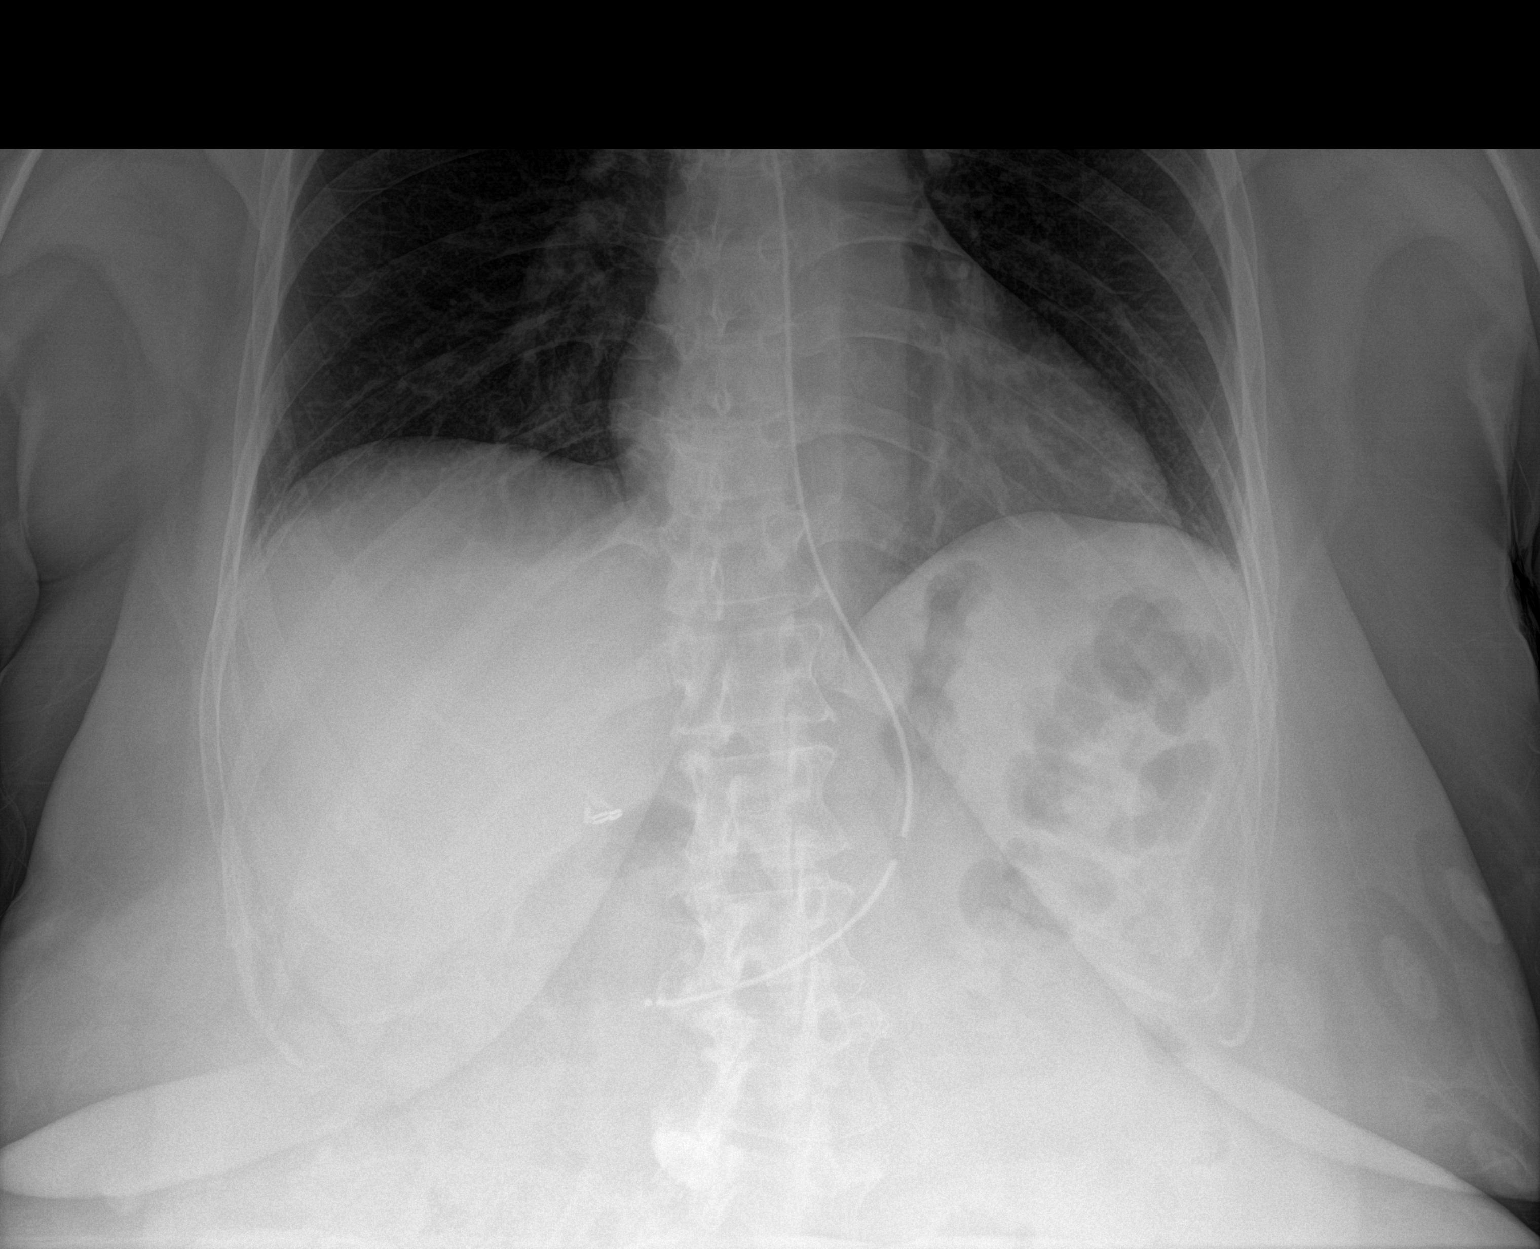

[1 of 1 positions shown; findings below may reference images not displayed]

FINDINGS: A nasogastric tube is seen with its distal tip overlying the
expected region of the gastric antrum. The bowel gas pattern is
normal. No radio-opaque calculi or other significant radiographic
abnormality are seen. Radiopaque surgical clips are seen overlying
the right upper quadrant.
IMPRESSION: Interval repositioning of the nasogastric tube seen on the prior
study.

## 2019-03-21 MED ORDER — SODIUM CHLORIDE 0.9 % IV BOLUS
1000.0000 mL | Freq: Once | INTRAVENOUS | Status: AC
  Administered 2019-03-21: 1000 mL via INTRAVENOUS

## 2019-03-21 MED ORDER — MORPHINE SULFATE (PF) 4 MG/ML IV SOLN
4.0000 mg | Freq: Once | INTRAVENOUS | Status: AC
  Administered 2019-03-21: 4 mg via INTRAVENOUS
  Filled 2019-03-21: qty 1

## 2019-03-21 MED ORDER — ONDANSETRON HCL 4 MG/2ML IJ SOLN
4.0000 mg | Freq: Four times a day (QID) | INTRAMUSCULAR | Status: DC | PRN
  Administered 2019-03-21: 4 mg via INTRAVENOUS
  Filled 2019-03-21: qty 2

## 2019-03-21 MED ORDER — ONDANSETRON HCL 4 MG PO TABS: 4.0000 mg | ORAL_TABLET | Freq: Four times a day (QID) | ORAL | Status: DC | PRN

## 2019-03-21 MED ORDER — ACETAMINOPHEN 325 MG PO TABS
650.0000 mg | ORAL_TABLET | Freq: Four times a day (QID) | ORAL | Status: DC | PRN
  Administered 2019-03-22: 650 mg via ORAL
  Filled 2019-03-21: qty 2

## 2019-03-21 MED ORDER — ENOXAPARIN SODIUM 40 MG/0.4ML ~~LOC~~ SOLN
40.0000 mg | SUBCUTANEOUS | Status: DC
  Administered 2019-03-21 – 2019-03-22 (×2): 40 mg via SUBCUTANEOUS
  Filled 2019-03-21 (×2): qty 0.4

## 2019-03-21 MED ORDER — IOHEXOL 300 MG/ML  SOLN
100.0000 mL | Freq: Once | INTRAMUSCULAR | Status: AC | PRN
  Administered 2019-03-21: 100 mL via INTRAVENOUS

## 2019-03-21 MED ORDER — ONDANSETRON 4 MG PO TBDP
4.0000 mg | ORAL_TABLET | Freq: Once | ORAL | Status: AC
  Administered 2019-03-21: 4 mg via ORAL

## 2019-03-21 MED ORDER — SODIUM CHLORIDE 0.9% FLUSH
3.0000 mL | Freq: Once | INTRAVENOUS | Status: AC
  Administered 2019-03-21: 3 mL via INTRAVENOUS

## 2019-03-21 MED ORDER — ONDANSETRON HCL 4 MG/2ML IJ SOLN: 4.0000 mg | Freq: Once | INTRAMUSCULAR | Status: DC

## 2019-03-21 MED ORDER — ACETAMINOPHEN 650 MG RE SUPP: 650.0000 mg | Freq: Four times a day (QID) | RECTAL | Status: DC | PRN

## 2019-03-21 MED ORDER — ALBUTEROL SULFATE (2.5 MG/3ML) 0.083% IN NEBU: 2.5000 mg | INHALATION_SOLUTION | Freq: Four times a day (QID) | RESPIRATORY_TRACT | Status: DC | PRN

## 2019-03-21 MED ORDER — HYDRALAZINE HCL 20 MG/ML IJ SOLN: 10.0000 mg | INTRAMUSCULAR | Status: DC | PRN

## 2019-03-21 MED ORDER — ONDANSETRON HCL 4 MG/2ML IJ SOLN
4.0000 mg | Freq: Once | INTRAMUSCULAR | Status: AC
  Administered 2019-03-21: 4 mg via INTRAVENOUS
  Filled 2019-03-21: qty 2

## 2019-03-21 MED ORDER — MORPHINE SULFATE (PF) 2 MG/ML IV SOLN
2.0000 mg | INTRAVENOUS | Status: DC | PRN
  Administered 2019-03-21: 2 mg via INTRAVENOUS
  Filled 2019-03-21: qty 1

## 2019-03-21 MED ORDER — HYDROMORPHONE HCL 1 MG/ML IJ SOLN
1.0000 mg | Freq: Once | INTRAMUSCULAR | Status: AC
  Administered 2019-03-21: 1 mg via INTRAVENOUS
  Filled 2019-03-21: qty 1

## 2019-03-21 MED ORDER — DIATRIZOATE MEGLUMINE & SODIUM 66-10 % PO SOLN
90.0000 mL | Freq: Once | ORAL | Status: AC
  Administered 2019-03-21: 90 mL via NASOGASTRIC
  Filled 2019-03-21: qty 90

## 2019-03-21 MED ORDER — SODIUM CHLORIDE 0.9 % IV SOLN: INTRAVENOUS | Status: DC

## 2019-03-21 NOTE — ED Notes (Signed)
NG advanced per MD order

## 2019-03-21 NOTE — Consult Note (Signed)
[facility] Surgery Consult Note  Emily Wolfe January 29, 1955  DF:[id number].    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 Trude Mcburney in [place], who presented to [facility] complaining of acute onset abdominal pain. States that around 1800 last night she began experiencing intermittent crampy abdominal pain. Pain was mostly right sided. She reports nausea and vomiting, but thinks that emesis was pain induced. Last BM was yesterday morning. She was passing some flatus last night, none so far today. States that her symptoms feel similar to prior small bowel obstructions. She also reports that over the last few days she has been eating salad because the weather is warmer, which she has not done in some time.  ED work up included CT scan which shows dilated fluid-filled and fecalized small bowel with decompressed distal small bowel, findings compatible with distal small bowel obstruction; no transition point noted. WBC 9.5, afebrile. General surgery asked to see.  PMH significant for HTN, HLD, MS Anticoagulants: none Nonsmoker Employment: retired  Review of Systems  Constitutional: Negative.   Respiratory: Negative.   Cardiovascular: Negative for chest pain and leg swelling.  Gastrointestinal: Positive for abdominal pain, nausea and vomiting.  Genitourinary: Negative.   Musculoskeletal: Positive for joint pain.  Neurological: Positive for weakness.   All systems reviewed and otherwise negative except for as above  Family History  Problem Relation Age of Onset  . Breast cancer Mother   . Cervical cancer Maternal Grandmother     Past Medical  History:  Diagnosis Date  . Angio-edema   . Eczema   . Genital warts 11/19/2017  . 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 History of ovarian cancer 11/19/2017  . Hyperlipidemia 06/29/2017  . Hypertension 06/29/2017  . Malignant neoplastic disease ([place]) 06/29/2017  . Mild intermittent asthma without complication 0000000  . Multiple sclerosis ([place]) 06/29/2017  . Postmenopausal 06/29/2017  . Scoliosis of lumbar spine 06/29/2017  . Small bowel obstruction ([place]) 06/29/2017  . Urticaria   . 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 Vitamin D deficiency 06/29/2017    Past Surgical History:  Procedure Laterality Date  . ADENOIDECTOMY    . CHOLECYSTECTOMY  2009  . HERNIA REPAIR    . HYSTEROTOMY  2008  . SINOSCOPY    . TONSILLECTOMY    . TYMPANOSTOMY TUBE PLACEMENT      Social History:  reports that she has never smoked. She has never used smokeless tobacco. She reports previous alcohol use. She reports previous drug use.  Allergies: No Known Allergies  (Not in a hospital admission)   Prior to Admission medications   Medication Sig Start Date End Date Taking? Authorizing Provider  azelastine (ASTELIN) 0.1 % nasal spray Place 2 sprays into both nostrils 2 (two) times daily. 01/13/19   Dara Hoyer, FNP  Cholecalciferol (VITAMIN D3) 1.25 MG (50000 UT) CAPS TAKE 1 CAPSULE BY MOUTH EVERY 7 DAYS 12/07/17   [provider]  diazepam (VALIUM) 5 MG tablet Take 1 tablet (5 mg total) by mouth at bedtime as needed for anxiety. 06/13/18   Briscoe Deutscher, DO  diclofenac sodium (VOLTAREN) 1 % GEL diclofenac 1 % topical gel  APPLY 2  GRAM TO THE AFFECTED AREA(S) 2-3 TIMES PER DAY    [provider]  Fingolimod HCl (GILENYA PO) Take 1 tablet by mouth daily.    [provider]  losartan (COZAAR) 25 MG tablet Take 1 tablet (25 mg total) by mouth daily. 12/20/18   Vivi Barrack, MD  OXcarbazepine (TRILEPTAL) 150 MG tablet Take 150 mg by mouth 2  (two) times daily. Taking 1 tablet in the morning  2 tablets at night 07/22/18   [provider]  prazosin (MINIPRESS) 1 MG capsule Take 1 capsule (1 mg total) by mouth at bedtime. 03/07/19   Vivi Barrack, MD    Blood pressure (!) 146/78, pulse 80, temperature 98.3 F (36.8 C), temperature source Oral, resp. rate 17, height 5\' 6"  (1.676 m), weight 90.7 kg, SpO2 99 %. Physical Exam: General: pleasant, WD/WN white female who is laying in bed in NAD HEENT: head is normocephalic, atraumatic.  Sclera are noninjected.  PERRL.  Ears and nose without any masses or lesions.  Mouth is pink and moist. Dentition fair Heart: regular, rate, and rhythm.  Normal s1,s2. No obvious murmurs, gallops, or rubs noted.  Palpable pedal pulses bilaterally  Lungs: CTAB, no wheezes, rhonchi, or rales noted.  Respiratory effort nonlabored Abd: well healed midline/ right groin/ lower transverse incisions, soft, ND, +BS, no masses, hernias, or organomegaly. Mild right sided abdominal TTP without rebound or guarding MS: no BUE/BLE edema, calves soft and nontender Skin: warm and dry with no masses, lesions, or rashes Psych: A&Ox4 with an appropriate affect Neuro: cranial nerves grossly intact, equal strength in BUE/BLE bilaterally, normal speech, thought process intact  Results for orders placed or performed during the hospital encounter of 03/21/19 (from the past 48 hour(s))  Urinalysis, Routine w reflex microscopic     Status: Abnormal   Collection Time: 03/21/19  1:33 AM  Result Value Ref Range   Color, Urine YELLOW YELLOW   APPearance HAZY (A) CLEAR   Specific Gravity, Urine 1.018 1.005 - 1.030   pH 8.0 5.0 - 8.0   Glucose, UA NEGATIVE NEGATIVE mg/dL   Hgb urine dipstick NEGATIVE NEGATIVE   Bilirubin Urine NEGATIVE NEGATIVE   Ketones, ur 20 (A) NEGATIVE mg/dL   Protein, ur 30 (A) NEGATIVE mg/dL   Nitrite NEGATIVE NEGATIVE   Leukocytes,Ua NEGATIVE NEGATIVE   RBC / HPF 0-5 0 - 5 RBC/hpf   WBC, UA 0-5 0 -  5 WBC/hpf   Bacteria, UA RARE (A) NONE SEEN   Squamous Epithelial / LPF 6-10 0 - 5   Mucus PRESENT    Hyaline Casts, UA PRESENT     Comment: Performed at [facility], [address] [place], [place] [address]  Lipase, blood     Status: None   Collection Time: 03/21/19  2:06 AM  Result Value Ref Range   Lipase 20 11 - 51 U/L    Comment: Performed at [facility] [address] [place], [place] [address]  Comprehensive metabolic panel     Status: Abnormal   Collection Time: 03/21/19  2:06 AM  Result Value Ref Range   Sodium 139 135 - 145 mmol/L   Potassium 4.0 3.5 - 5.1 mmol/L   Chloride 101 98 - 111 mmol/L   CO2 25 22 - 32 mmol/L   Glucose, Bld 155 (H) 70 - 99 mg/dL    Comment: Glucose reference range applies only to samples taken after fasting for at least 8 hours.   BUN 11  8 - 23 mg/dL   Creatinine, Ser 0.86 0.44 - 1.00 mg/dL   Calcium 9.6 8.9 - 10.3 mg/dL   Total Protein 6.7 6.5 - 8.1 g/dL   Albumin 4.3 3.5 - 5.0 g/dL   AST 22 15 - 41 U/L   ALT 15 0 - 44 U/L   Alkaline Phosphatase 82 38 - 126 U/L   Total Bilirubin 0.7 0.3 - 1.2 mg/dL   GFR calc non Af Amer >60 >60 mL/min   GFR calc Af Amer >60 >60 mL/min   Anion gap 13 5 - 15    Comment: Performed at [facility], [place] [address] [place], [place] [address]  CBC     Status: None   Collection Time: 03/21/19  2:06 AM  Result Value Ref Range   WBC 9.5 4.0 - 10.5 K/uL   RBC 4.80 3.87 - 5.11 MIL/uL   Hemoglobin 13.9 12.0 - 15.0 g/dL   HCT 43.0 36.0 - 46.0 %   MCV 89.6 80.0 - 100.0 fL   MCH 29.0 26.0 - 34.0 pg   MCHC 32.3 30.0 - 36.0 g/dL   RDW 14.2 11.5 - 15.5 %   Platelets 240 150 - 400 K/uL   nRBC 0.0 0.0 - 0.2 %    Comment: Performed at [facility], [place] [address] [place], [place] [address]   CT ABDOMEN PELVIS W CONTRAST  Result Date: 03/21/2019 CLINICAL DATA:  Abdominal pain, nausea, vomiting EXAM: CT ABDOMEN AND PELVIS WITH CONTRAST TECHNIQUE: Multidetector CT imaging of the abdomen and  pelvis was performed using the standard protocol following bolus administration of intravenous contrast. CONTRAST:  13mL OMNIPAQUE IOHEXOL 300 MG/ML  SOLN COMPARISON:  None. FINDINGS: Lower chest: Lung bases are clear. No effusions. Heart is normal size. Hepatobiliary: No focal liver abnormality is seen. Status post cholecystectomy. No biliary dilatation. Pancreas: No focal abnormality or ductal dilatation. Spleen: No focal abnormality.  Normal size. Adrenals/Urinary Tract: No adrenal abnormality. No focal renal abnormality. No stones or hydronephrosis. Urinary bladder is unremarkable. Stomach/Bowel: Postoperative changes in the sigmoid colon. Postoperative changes also in distal small bowel loops. There are dilated fluid-filled and fecalized small bowel loops in the pelvis. Distal small bowel is decompressed. Findings compatible with distal small bowel obstruction. Exact transition point not visualized. Stomach and large bowel decompressed, unremarkable. Vascular/Lymphatic: Aortic atherosclerosis. No enlarged abdominal or pelvic lymph nodes. Reproductive: Prior hysterectomy.  No adnexal masses. Other: No free fluid or free air. Musculoskeletal: No acute bony abnormality. IMPRESSION: Dilated fluid-filled and fecalized small bowel with decompressed distal small bowel. Findings compatible with distal small bowel obstruction. Electronically Signed   By: Rolm Baptise M.D.   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 Trude Mcburney in [place] - Patient with possible SBO, although CT scan does not show a transition point and she has  several loops of fecalized small bowel. Recommend medical admission, bowel rest and NG tube for decompression. Will start small bowel protocol.   ID - none VTE - SCDs, lovenox FEN - IVF, NPO/NGT to LIWS Foley - none Follow up - TBD  [place], [facility] Surgery 03/21/2019, 9:45 AM  Please see Amion for pager number during day hours 7:00am-4:30pm

## 2019-03-21 NOTE — H&P (Signed)
History and Physical    Emily Wolfe [id number] DOB: April 16, 1955 DOA: 03/21/2019  Referring MD/NP/PA: Rodell Perna, MD PCP: Vivi Barrack, MD  Patient coming from: Home via EMS  Chief Complaint: Abdominal pain  I have personally briefly reviewed patient's old medical records in [place]   HPI: Emily Wolfe is a 64 y.o. female with medical history significant of hypertension, hyperlipidemia, asthma, multiple sclerosis, and multiple abdominal surgeries with previous bowel obstructions requiring previous resection.  She presents with complaints of abdominal pain starting yesterday afternoon 6 PM 1 hour after having dinner.  Pain is mostly on the right side, but radiates toward the middle of her stomach.  Describes pain as burning and crampy. She initially thought symptoms were secondary to food poisoning or possibly an ulcer as she been taking Aleve intermittently for pain.  Tried taking Tums without relief of symptoms.  Her last bowel movement was approximately 2 days ago.    ED Course: Upon admission into the emergency department patient was seen to be afebrile, blood pressures 145/91-161/84, all other vital signs maintained.  Labs were relatively unremarkable.  CT scan of the abdomen and pelvis revealed distal small bowel obstruction.  General surgery was consulted.  Patient was given 2 L normal saline IV fluids, antiemetics, pain medication, and NGT was ordered to suction.  TRH called to admit.  Review of Systems  Constitutional: Negative for chills and fever.  HENT: Negative for ear discharge and nosebleeds.   Eyes: Negative for pain.  Respiratory: Negative for hemoptysis and shortness of breath.   Cardiovascular: Negative for chest pain and leg swelling.  Gastrointestinal: Positive for abdominal pain, nausea and vomiting. Negative for blood in stool.  Genitourinary: Negative for dysuria.  Musculoskeletal: Negative for falls.  Neurological: Negative for sensory change and  focal weakness.  Psychiatric/Behavioral: Negative for hallucinations. The patient does not have insomnia.     Past Medical History:  Diagnosis Date  . Angio-edema   . Eczema   . Genital warts 11/19/2017  . 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 History of ovarian cancer 11/19/2017  . Hyperlipidemia 06/29/2017  . Hypertension 06/29/2017  . Malignant neoplastic disease ([place]) 06/29/2017  . Mild intermittent asthma without complication 0000000  . Multiple sclerosis ([place]) 06/29/2017  . Postmenopausal 06/29/2017  . Scoliosis of lumbar spine 06/29/2017  . Small bowel obstruction ([place]) 06/29/2017  . Urticaria   . 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 Vitamin D deficiency 06/29/2017    Past Surgical History:  Procedure Laterality Date  . ADENOIDECTOMY    . CHOLECYSTECTOMY  2009  . HERNIA REPAIR    . HYSTEROTOMY  2008  . SINOSCOPY    . TONSILLECTOMY    . TYMPANOSTOMY TUBE PLACEMENT       reports that she has never smoked. She has never used smokeless tobacco. She reports previous alcohol use. She reports previous drug use.  No Known Allergies  Family History  Problem Relation Age of Onset  . Breast cancer Mother   . Cervical cancer Maternal Grandmother     Prior to Admission medications   Medication Sig Start Date End Date Taking? Authorizing Provider  azelastine (ASTELIN) 0.1 % nasal spray Place 2 sprays into both nostrils 2 (two) times daily. 01/13/19   Dara Hoyer, FNP  Cholecalciferol (VITAMIN D3) 1.25 MG (50000 UT) CAPS TAKE 1 CAPSULE BY MOUTH EVERY 7 DAYS 12/07/17   [provider]  diazepam (VALIUM) 5 MG tablet  Take 1 tablet (5 mg total) by mouth at bedtime as needed for anxiety. 06/13/18   Briscoe Deutscher, DO  diclofenac sodium (VOLTAREN) 1 % GEL diclofenac 1 % topical gel  APPLY 2 GRAM TO THE AFFECTED AREA(S) 2-3 TIMES PER DAY    [provider]  Fingolimod HCl (GILENYA PO) Take 1 tablet by mouth daily.    [provider]  losartan (COZAAR) 25 MG tablet Take 1 tablet (25 mg total) by mouth daily. 12/20/18   Vivi Barrack, MD  OXcarbazepine (TRILEPTAL) 150 MG tablet Take 150 mg by mouth 2 (two) times daily. Taking 1 tablet in the morning  2 tablets at night 07/22/18   [provider]  prazosin (MINIPRESS) 1 MG capsule Take 1 capsule (1 mg total) by mouth at bedtime. 03/07/19   Vivi Barrack, MD    Physical Exam:  Constitutional: Older female in no acute distress and able to follow commands Vitals:   03/21/19 0121 03/21/19 0128 03/21/19 0406  BP: (!) 150/81  (!) 145/91  Pulse: 75  70  Resp: 16  17  Temp: 98 F (36.7 C)  98.3 F (36.8 C)  TempSrc: Oral  Oral  SpO2: 100%  93%  Weight:  90.7 kg   Height:  5\' 6"  (1.676 m)    Eyes: PERRL, lids and conjunctivae normal ENMT: Mucous membranes are moist. Posterior pharynx clear of any exudate or lesions.Normal dentition.  Neck: normal, supple, no masses, no thyromegaly Respiratory: clear to auscultation bilaterally, no wheezing, no crackles. Normal respiratory effort. No accessory muscle use.  Cardiovascular: Regular rate and rhythm, no murmurs / rubs / gallops. No extremity edema. 2+ pedal pulses. No carotid bruits.  Abdomen: Tenderness palpation of the right quadrant of the abdomen with decreased bowel sounds Musculoskeletal: no clubbing / cyanosis. No joint deformity upper and lower extremities. Good ROM, no contractures. Normal muscle tone.  Skin: no rashes, lesions, ulcers. No induration Neurologic: CN 2-12 grossly intact. Sensation intact, DTR normal. Strength 5/5 in all 4.  Psychiatric: Normal judgment and insight. Alert and oriented x 3. Normal mood.     Labs on Admission: I have personally reviewed following labs and imaging studies  CBC: Recent Labs  Lab 03/21/19 0206  WBC 9.5  HGB 13.9  HCT 43.0  MCV 89.6  PLT [id number]   Basic Metabolic Panel: Recent Labs  Lab 03/21/19 0206  NA 139  K 4.0  CL 101  CO2 25    GLUCOSE 155*  BUN 11  CREATININE 0.86  CALCIUM 9.6   GFR: Estimated Creatinine Clearance: 76 mL/min (by C-G formula based on SCr of 0.86 mg/dL). Liver Function Tests: Recent Labs  Lab 03/21/19 0206  AST 22  ALT 15  ALKPHOS 82  BILITOT 0.7  PROT 6.7  ALBUMIN 4.3   Recent Labs  Lab 03/21/19 0206  LIPASE 20   No results for input(s): AMMONIA in the last 168 hours. Coagulation Profile: No results for input(s): INR, PROTIME in the last 168 hours. Cardiac Enzymes: No results for input(s): CKTOTAL, CKMB, CKMBINDEX, TROPONINI in the last 168 hours. BNP (last 3 results) No results for input(s): PROBNP in the last 8760 hours. HbA1C: No results for input(s): HGBA1C in the last 72 hours. CBG: No results for input(s): GLUCAP in the last 168 hours. Lipid Profile: No results for input(s): CHOL, HDL, LDLCALC, TRIG, CHOLHDL, LDLDIRECT in the last 72 hours. Thyroid Function Tests: No results for input(s): TSH, T4TOTAL, FREET4, T3FREE, THYROIDAB in the last 72  hours. Anemia Panel: No results for input(s): VITAMINB12, FOLATE, FERRITIN, TIBC, IRON, RETICCTPCT in the last 72 hours. Urine analysis:    Component Value Date/Time   COLORURINE YELLOW 03/21/2019 0133   APPEARANCEUR HAZY (A) 03/21/2019 0133   LABSPEC 1.018 03/21/2019 0133   PHURINE 8.0 03/21/2019 0133   GLUCOSEU NEGATIVE 03/21/2019 0133   HGBUR NEGATIVE 03/21/2019 0133   BILIRUBINUR NEGATIVE 03/21/2019 0133   KETONESUR 20 (A) 03/21/2019 0133   PROTEINUR 30 (A) 03/21/2019 0133   NITRITE NEGATIVE 03/21/2019 0133   LEUKOCYTESUR NEGATIVE 03/21/2019 0133   Sepsis Labs: No results found for this or any previous visit (from the past 240 hour(s)).   Radiological Exams on Admission: CT ABDOMEN PELVIS W CONTRAST  Result Date: 03/21/2019 CLINICAL DATA:  Abdominal pain, nausea, vomiting EXAM: CT ABDOMEN AND PELVIS WITH CONTRAST TECHNIQUE: Multidetector CT imaging of the abdomen and pelvis was performed using the standard  protocol following bolus administration of intravenous contrast. CONTRAST:  148mL OMNIPAQUE IOHEXOL 300 MG/ML  SOLN COMPARISON:  None. FINDINGS: Lower chest: Lung bases are clear. No effusions. Heart is normal size. Hepatobiliary: No focal liver abnormality is seen. Status post cholecystectomy. No biliary dilatation. Pancreas: No focal abnormality or ductal dilatation. Spleen: No focal abnormality.  Normal size. Adrenals/Urinary Tract: No adrenal abnormality. No focal renal abnormality. No stones or hydronephrosis. Urinary bladder is unremarkable. Stomach/Bowel: Postoperative changes in the sigmoid colon. Postoperative changes also in distal small bowel loops. There are dilated fluid-filled and fecalized small bowel loops in the pelvis. Distal small bowel is decompressed. Findings compatible with distal small bowel obstruction. Exact transition point not visualized. Stomach and large bowel decompressed, unremarkable. Vascular/Lymphatic: Aortic atherosclerosis. No enlarged abdominal or pelvic lymph nodes. Reproductive: Prior hysterectomy.  No adnexal masses. Other: No free fluid or free air. Musculoskeletal: No acute bony abnormality. IMPRESSION: Dilated fluid-filled and fecalized small bowel with decompressed distal small bowel. Findings compatible with distal small bowel obstruction. Electronically Signed   By: Rolm Baptise M.D.   On: 03/21/2019 08:12    EKG: Independently reviewed.  Normal sinus rhythm at 72 bpm  Assessment/Plan Small bowel obstruction: recurrent.  Patient presents with complaints of abdominal pain.  Patient with previous history of multiple abdominal surgeries and small bowel obstruction requiring surgical intervention.  Imaging studies significant for distal small bowel obstruction.  General surgery was consulted. -Admit to MedSurg bed -N.p.o. -NGT to suction -Strict intake and output -IV morphine as needed for pain control -Normal saline IV fluids 750 mL/h -Appreciate general  surgery consultative services, will follow-up for further recommendation.  Essential hypertension -Hydralazine IV as needed  DVT prophylaxis: Lovenox Code Status: Full Family Communication: Was unable to reach family at this time Disposition Plan: Possible discharge home once bowel obstruction resolves and patient able to tolerate p.o. Consults called: General surgery Admission status: Inpatient  Norval Morton MD [facility] Pager [phone]   If 7PM-7AM, please contact night-coverage [email or web address] Password [facility]  03/21/2019, 8:54 AM

## 2019-03-21 NOTE — ED Notes (Signed)
Pt remains alert, oriented. Talking on cell phone. Gastrografin administered - NG tube clamped. Pt denies N/V.

## 2019-03-21 NOTE — ED Notes (Signed)
Pt ambulated to bathroom and back to room - unable to use Pure [place] - removed.

## 2019-03-21 NOTE — ED Notes (Signed)
ED TO INPATIENT HANDOFF REPORT  ED Nurse Name and Phone #:  Gwyndolyn Saxon [phone]  S Name/Age/Gender Emily Wolfe 64 y.o. female Room/Bed: 051C/051C  Code Status   Code Status: Full Code  Home/SNF/Other Home Patient oriented to: self, place, time and situation Is this baseline? Yes   Triage Complete: Triage complete  Chief Complaint SBO (small bowel obstruction) ([place]) [K56.609]  Triage Note Pt c/o abd pain 10/10 with nausea and vomiting since 6 pm today.    Allergies No Known Allergies  Level of Care/Admitting Diagnosis ED Disposition    ED Disposition Condition [facility]: [place] [[id number]]  Level of Care: Med-Surg [16]  May admit patient to [facility] or [facility] if equivalent level of care is available:: No  Covid Evaluation: Asymptomatic Screening Protocol (No Symptoms)  Diagnosis: SBO (small bowel obstruction) [facility] [id number]  Admitting Physician: Norval Morton [id number]  Attending Physician: Norval Morton [id number]  Estimated length of stay: past midnight tomorrow  Certification:: I certify this patient will need inpatient services for at least 2 midnights       B Medical/Surgery History Past Medical History:  Diagnosis Date  . Angio-edema   . Eczema   . Genital warts 11/19/2017  . 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 History of ovarian cancer 11/19/2017  . Hyperlipidemia 06/29/2017  . Hypertension 06/29/2017  . Malignant neoplastic disease ([place]) 06/29/2017  . Mild intermittent asthma without complication 0000000  . Multiple sclerosis ([place]) 06/29/2017  . Postmenopausal 06/29/2017  . Scoliosis of lumbar spine 06/29/2017  . Small bowel obstruction ([place]) 06/29/2017  . Urticaria   . 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 Vitamin D deficiency 06/29/2017   Past Surgical History:  Procedure Laterality Date  . ADENOIDECTOMY    . CHOLECYSTECTOMY  2009  . HERNIA  REPAIR    . HYSTEROTOMY  2008  . SINOSCOPY    . TONSILLECTOMY    . TYMPANOSTOMY TUBE PLACEMENT       A IV Location/Drains/Wounds Patient Lines/Drains/Airways Status   Active Line/Drains/Airways    Name:   Placement date:   Placement time:   Site:   Days:   Peripheral IV 03/21/19 Right Antecubital   03/21/19    0648    Antecubital   less than 1   NG/OG Tube Nasogastric 16 Fr. Left nare Aucultation   03/21/19    1036    Left nare   less than 1          Intake/Output Last 24 hours No intake or output data in the 24 hours ending 03/21/19 1939  Labs/Imaging Results for orders placed or performed during the hospital encounter of 03/21/19 (from the past 48 hour(s))  Urinalysis, Routine w reflex microscopic     Status: Abnormal   Collection Time: 03/21/19  1:33 AM  Result Value Ref Range   Color, Urine YELLOW YELLOW   APPearance HAZY (A) CLEAR   Specific Gravity, Urine 1.018 1.005 - 1.030   pH 8.0 5.0 - 8.0   Glucose, UA NEGATIVE NEGATIVE mg/dL   Hgb urine dipstick NEGATIVE NEGATIVE   Bilirubin Urine NEGATIVE NEGATIVE   Ketones, ur 20 (A) NEGATIVE mg/dL   Protein, ur 30 (A) NEGATIVE mg/dL   Nitrite NEGATIVE NEGATIVE   Leukocytes,Ua NEGATIVE NEGATIVE   RBC / HPF 0-5 0 - 5 RBC/hpf   WBC, UA 0-5 0 - 5 WBC/hpf   Bacteria, UA RARE (A) NONE  SEEN   Squamous Epithelial / LPF 6-10 0 - 5   Mucus PRESENT    Hyaline Casts, UA PRESENT     Comment: Performed at [facility], [place] [address], [place], [place] [address]  Lipase, blood     Status: None   Collection Time: 03/21/19  2:06 AM  Result Value Ref Range   Lipase 20 11 - 51 U/L    Comment: Performed at [facility] [address], [place], [place] [address]  Comprehensive metabolic panel     Status: Abnormal   Collection Time: 03/21/19  2:06 AM  Result Value Ref Range   Sodium 139 135 - 145 mmol/L   Potassium 4.0 3.5 - 5.1 mmol/L   Chloride 101 98 - 111 mmol/L   CO2 25 22 - 32 mmol/L   Glucose, Bld 155 (H) 70 - 99  mg/dL    Comment: Glucose reference range applies only to samples taken after fasting for at least 8 hours.   BUN 11 8 - 23 mg/dL   Creatinine, Ser 0.86 0.44 - 1.00 mg/dL   Calcium 9.6 8.9 - 10.3 mg/dL   Total Protein 6.7 6.5 - 8.1 g/dL   Albumin 4.3 3.5 - 5.0 g/dL   AST 22 15 - 41 U/L   ALT 15 0 - 44 U/L   Alkaline Phosphatase 82 38 - 126 U/L   Total Bilirubin 0.7 0.3 - 1.2 mg/dL   GFR calc non Af Amer >60 >60 mL/min   GFR calc Af Amer >60 >60 mL/min   Anion gap 13 5 - 15    Comment: Performed at [facility], [place] [address], [place], [place] [address]  CBC     Status: None   Collection Time: 03/21/19  2:06 AM  Result Value Ref Range   WBC 9.5 4.0 - 10.5 K/uL   RBC 4.80 3.87 - 5.11 MIL/uL   Hemoglobin 13.9 12.0 - 15.0 g/dL   HCT 43.0 36.0 - 46.0 %   MCV 89.6 80.0 - 100.0 fL   MCH 29.0 26.0 - 34.0 pg   MCHC 32.3 30.0 - 36.0 g/dL   RDW 14.2 11.5 - 15.5 %   Platelets 240 150 - 400 K/uL   nRBC 0.0 0.0 - 0.2 %    Comment: Performed at [facility], [place] [address], [place], [place] [address]  SARS CORONAVIRUS 2 (TAT 6-24 HRS) Nasopharyngeal Nasopharyngeal Swab     Status: None   Collection Time: 03/21/19 10:54 AM   Specimen: Nasopharyngeal Swab  Result Value Ref Range   SARS Coronavirus 2 NEGATIVE NEGATIVE    Comment: (NOTE) SARS-CoV-2 target nucleic acids are NOT DETECTED. The SARS-CoV-2 RNA is generally detectable in upper and lower respiratory specimens during the acute phase of infection. Negative results do not preclude SARS-CoV-2 infection, do not rule out co-infections with other pathogens, and should not be used as the sole basis for treatment or other patient management decisions. Negative results must be combined with clinical observations, patient history, and epidemiological information. The expected result is Negative. Fact Sheet for Patients: SugarRoll.be Fact Sheet for Healthcare  Providers: https://www.woods-mathews.com/ This test is not yet approved or cleared by the [place] FDA and  has been authorized for detection and/or diagnosis of SARS-CoV-2 by FDA under an Emergency Use Authorization (EUA). This EUA will remain  in effect (meaning this test can be used) for the duration of the COVID-19 declaration under Section 56 4(b)(1) of the Act,  21 U.S.C. section 360bbb-3(b)(1), unless the authorization is terminated or revoked sooner. Performed at [facility], [place] [address] [place], [place] [address]    CT ABDOMEN PELVIS W CONTRAST  Result Date: 03/21/2019 CLINICAL DATA:  Abdominal pain, nausea, vomiting EXAM: CT ABDOMEN AND PELVIS WITH CONTRAST TECHNIQUE: Multidetector CT imaging of the abdomen and pelvis was performed using the standard protocol following bolus administration of intravenous contrast. CONTRAST:  168mL OMNIPAQUE IOHEXOL 300 MG/ML  SOLN COMPARISON:  None. FINDINGS: Lower chest: Lung bases are clear. No effusions. Heart is normal size. Hepatobiliary: No focal liver abnormality is seen. Status post cholecystectomy. No biliary dilatation. Pancreas: No focal abnormality or ductal dilatation. Spleen: No focal abnormality.  Normal size. Adrenals/Urinary Tract: No adrenal abnormality. No focal renal abnormality. No stones or hydronephrosis. Urinary bladder is unremarkable. Stomach/Bowel: Postoperative changes in the sigmoid colon. Postoperative changes also in distal small bowel loops. There are dilated fluid-filled and fecalized small bowel loops in the pelvis. Distal small bowel is decompressed. Findings compatible with distal small bowel obstruction. Exact transition point not visualized. Stomach and large bowel decompressed, unremarkable. Vascular/Lymphatic: Aortic atherosclerosis. No enlarged abdominal or pelvic lymph nodes. Reproductive: Prior hysterectomy.  No adnexal masses. Other: No free fluid or free air. Musculoskeletal: No acute  bony abnormality. IMPRESSION: Dilated fluid-filled and fecalized small bowel with decompressed distal small bowel. Findings compatible with distal small bowel obstruction. Electronically Signed   By: Rolm Baptise M.D.   On: 03/21/2019 08:12   DG Abd Portable 1 View  Result Date: 03/21/2019 CLINICAL DATA:  Nasogastric tube placement. EXAM: PORTABLE ABDOMEN - 1 VIEW COMPARISON:  March 21, 2019 (acquired at 12.40 8 p.m.) FINDINGS: A nasogastric tube is seen with its distal tip overlying the expected region of the gastric antrum. The bowel gas pattern is normal. No radio-opaque calculi or other significant radiographic abnormality are seen. Radiopaque surgical clips are seen overlying the right upper quadrant. IMPRESSION: Interval repositioning of the nasogastric tube seen on the prior study. Electronically Signed   By: Virgina Norfolk M.D.   On: 03/21/2019 16:38   DG Abd Portable 1V-Small Bowel Protocol-Position Verification  Result Date: 03/21/2019 CLINICAL DATA:  Nasogastric tube placement. SBO EXAM: PORTABLE ABDOMEN - 1 VIEW COMPARISON:  CT 03/21/2019 FINDINGS: Nasogastric tube distal tip terminates just beyond the level of the GE junction with side port in the distal esophagus. Dilated loops of small bowel are partially visualized within the lower abdomen and pelvis. Air and stool are noted throughout the colon. IMPRESSION: Nasogastric tube distal tip terminates just beyond the level of the GE junction with side port in the distal esophagus. Recommend advancement 7-10 cm. Electronically Signed   By: Davina Poke D.O.   On: 03/21/2019 13:15    Pending Labs Unresulted Labs (From admission, onward)    Start     Ordered   03/22/19 [id number]  Basic metabolic panel  Tomorrow morning,   R     03/21/19 0909   03/22/19 0500  CBC  Tomorrow morning,   R     03/21/19 0909          Vitals/Pain Today's Vitals   03/21/19 1505 03/21/19 1720 03/21/19 1753 03/21/19 1938  BP:   (!) 150/87   Pulse:   72    Resp:   16   Temp:   98.4 F (36.9 C)   TempSrc:   Oral   SpO2:   96%   Weight:      Height:  PainSc: 7  0-No pain 0-No pain 0-No pain    Isolation Precautions No active isolations  Medications Medications  enoxaparin (LOVENOX) injection 40 mg (40 mg Subcutaneous Given 03/21/19 1019)  acetaminophen (TYLENOL) tablet 650 mg (has no administration in time range)    Or  acetaminophen (TYLENOL) suppository 650 mg (has no administration in time range)  0.9 %  sodium chloride infusion ( Intravenous New Bag/Given 03/21/19 1012)  ondansetron (ZOFRAN) tablet 4 mg (has no administration in time range)    Or  ondansetron (ZOFRAN) injection 4 mg (has no administration in time range)  albuterol (PROVENTIL) (2.5 MG/3ML) 0.083% nebulizer solution 2.5 mg (has no administration in time range)  morphine 2 MG/ML injection 2 mg (has no administration in time range)  hydrALAZINE (APRESOLINE) injection 10 mg (has no administration in time range)  sodium chloride flush (NS) 0.9 % injection 3 mL (3 mLs Intravenous Given 03/21/19 0705)  ondansetron (ZOFRAN-ODT) disintegrating tablet 4 mg (4 mg Oral Given 03/21/19 0138)  sodium chloride 0.9 % bolus 1,000 mL (1,000 mLs Intravenous Bolus from Bag 03/21/19 0705)  ondansetron (ZOFRAN) injection 4 mg (4 mg Intravenous Given 03/21/19 0705)  HYDROmorphone (DILAUDID) injection 1 mg (1 mg Intravenous Given 03/21/19 0704)  iohexol (OMNIPAQUE) 300 MG/ML solution 100 mL (100 mLs Intravenous Contrast Given 03/21/19 0751)  morphine 4 MG/ML injection 4 mg (4 mg Intravenous Given 03/21/19 1016)  sodium chloride 0.9 % bolus 1,000 mL (1,000 mLs Intravenous New Bag/Given 03/21/19 1012)  diatrizoate meglumine-sodium (GASTROGRAFIN) 66-10 % solution 90 mL (90 mLs Per NG tube Given 03/21/19 1844)    Mobility walks Low fall risk   Focused Assessments Patient has NG tube in place that is currently clamped after med administration due to be reconnected to suction at 1945 this  evening. Minimal drainage at this time.   R Recommendations: See Admitting Provider Note  Report given to:   Additional Notes: Patient is A&O x 4 and ambulatory in the unit; Pt ambulated w/o assistance to the bathroom; No pain noted at this time; Pt continues to receive the remaining of 2L N/S.-Monique,RN

## 2019-03-21 NOTE — ED Triage Notes (Signed)
Pt c/o abd pain 10/10 with nausea and vomiting since 6 pm today.

## 2019-03-21 NOTE — ED Provider Notes (Signed)
[facility] EMERGENCY DEPARTMENT Provider Note   CSN: [id number] Arrival date & time: 03/21/19  0117     History Chief Complaint  Patient presents with  . Abdominal Pain    Emily Wolfe is a 64 y.o. female with history of MS, asthma, hyperlipidemia, hypertension, recurrent small bowel obstruction presents for evaluation of acute onset, persistent and progressively worsening lower abdominal pain.  Reports that symptoms began around 6 PM yesterday evening after eating dinner.  Reports the pain is burning.  Has had a few episodes of nonbloody nonbilious emesis.  Reports that she has not been passing flatus but has had a bowel movement every day for the last 3 days.  She reports a history of recurrent small bowel obstruction and states that she underwent bowel resection and exploratory laparotomy on 05/11/2017 in [place].  Per care everywhere, findings during this procedure displayed "extensive adhesions involving the entire abdomen with small bowel, omentum, colon. Most of the jejunum is adherent to her previously inserted multiple mesh repairs, adhesions in the pelvis. At least 3 different kinds of synthetic mesh found. Distorted anatomy with difficult dissection." Denies fever but has had chills. Denies urinary symptoms.  She is status post appendectomy, cholecystectomy, and hysterectomy.   The history is provided by the patient.       Past Medical History:  Diagnosis Date  . Angio-edema   . Eczema   . Genital warts 11/19/2017  . 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 History of ovarian cancer 11/19/2017  . Hyperlipidemia 06/29/2017  . Hypertension 06/29/2017  . Malignant neoplastic disease ([place]) 06/29/2017  . Mild intermittent asthma without complication 0000000  . Multiple sclerosis ([place]) 06/29/2017  . Postmenopausal 06/29/2017  . Scoliosis of lumbar spine 06/29/2017  . Small bowel obstruction ([place]) 06/29/2017  . Urticaria   .  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 Vitamin D deficiency 06/29/2017    Patient Active Problem List   Diagnosis Date Noted  . Nightmares 03/07/2019  . Anxiety 12/20/2018  . Cervicalgia 08/18/2018  . Cough variant asthma vs UACS 01/03/2018  . History of total hysterectomy, due to ovarian cancer 12/26/2017  . Genital warts 11/19/2017  . History of ovarian cancer 11/19/2017  . Seasonal and perennial allergic rhinitis, followed by Allergist, treated with saline rinses and allergy shots 11/02/2017  . Mild intermittent asthma without complication 0000000  . Hyperlipidemia 06/29/2017  . Hypertension 06/29/2017  . Multiple sclerosis ([place]), followed by Dr. Gerald Leitz, seen twice yearly, on Fingolimod 06/29/2017  . Scoliosis of lumbar spine 06/29/2017  . 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 Vitamin D deficiency 06/29/2017  . 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 Lung mass 06/29/2017  . Colon abnormality 05/11/2017    Past Surgical History:  Procedure Laterality Date  . ADENOIDECTOMY    . CHOLECYSTECTOMY  2009  . HERNIA REPAIR    . HYSTEROTOMY  2008  . SINOSCOPY    . TONSILLECTOMY    . TYMPANOSTOMY TUBE PLACEMENT       OB History   No obstetric history on file.     Family History  Problem Relation Age of Onset  . Breast cancer Mother   . Cervical cancer Maternal Grandmother     Social History   Tobacco Use  . Smoking status: Never Smoker  . Smokeless tobacco: Never Used  Substance Use Topics  . Alcohol use: Not Currently  . Drug use: Not  Currently    Home Medications Prior to Admission medications   Medication Sig Start Date End Date Taking? Authorizing Provider  azelastine (ASTELIN) 0.1 % nasal spray Place 2 sprays into both nostrils 2 (two) times daily. 01/13/19   Dara Hoyer, FNP  Cholecalciferol (VITAMIN D3) 1.25 MG (50000 UT) CAPS TAKE 1 CAPSULE BY MOUTH EVERY 7 DAYS 12/07/17   [provider]    diazepam (VALIUM) 5 MG tablet Take 1 tablet (5 mg total) by mouth at bedtime as needed for anxiety. 06/13/18   Briscoe Deutscher, DO  diclofenac sodium (VOLTAREN) 1 % GEL diclofenac 1 % topical gel  APPLY 2 GRAM TO THE AFFECTED AREA(S) 2-3 TIMES PER DAY    [provider]  Fingolimod HCl (GILENYA PO) Take 1 tablet by mouth daily.    [provider]  losartan (COZAAR) 25 MG tablet Take 1 tablet (25 mg total) by mouth daily. 12/20/18   Vivi Barrack, MD  OXcarbazepine (TRILEPTAL) 150 MG tablet Take 150 mg by mouth 2 (two) times daily. Taking 1 tablet in the morning  2 tablets at night 07/22/18   [provider]  prazosin (MINIPRESS) 1 MG capsule Take 1 capsule (1 mg total) by mouth at bedtime. 03/07/19   Vivi Barrack, MD    Allergies    Patient has no known allergies.  Review of Systems   Review of Systems  Constitutional: Negative for chills and fever.  Respiratory: Negative for shortness of breath.   Cardiovascular: Negative for chest pain.  Gastrointestinal: Positive for abdominal pain, nausea and vomiting. Negative for constipation and diarrhea.  Genitourinary: Negative for dysuria, frequency, hematuria and urgency.  All other systems reviewed and are negative.   Physical Exam Updated Vital Signs BP (!) 146/78   Pulse 80   Temp 98.3 F (36.8 C) (Oral)   Resp 17   Ht 5\' 6"  (1.676 m)   Wt 90.7 kg   SpO2 99%   BMI 32.28 kg/m   Physical Exam Vitals and nursing note reviewed.  Constitutional:      General: She is not in acute distress.    Appearance: She is well-developed.  HENT:     Head: Normocephalic and atraumatic.  Eyes:     General:        Right eye: No discharge.        Left eye: No discharge.     Conjunctiva/sclera: Conjunctivae normal.  Neck:     Vascular: No JVD.     Trachea: No tracheal deviation.  Cardiovascular:     Rate and Rhythm: Normal rate and regular rhythm.  Pulmonary:     Effort: Pulmonary effort is normal.     Breath  sounds: Normal breath sounds.  Abdominal:     General: A surgical scar is present. Bowel sounds are increased. There is no distension.     Palpations: Abdomen is soft.     Tenderness: There is abdominal tenderness in the right lower quadrant, suprapubic area and left lower quadrant. There is no right CVA tenderness, left CVA tenderness, guarding or rebound.  Skin:    General: Skin is warm and dry.     Findings: No erythema.  Neurological:     Mental Status: She is alert.  Psychiatric:        Behavior: Behavior normal.     ED Results / Procedures / Treatments   Labs (all labs ordered are listed, but only abnormal results are displayed) Labs Reviewed  COMPREHENSIVE METABOLIC PANEL -  Abnormal; Notable for the following components:      Result Value   Glucose, Bld 155 (*)    All other components within normal limits  URINALYSIS, ROUTINE W REFLEX MICROSCOPIC - Abnormal; Notable for the following components:   APPearance HAZY (*)    Ketones, ur 20 (*)    Protein, ur 30 (*)    Bacteria, UA RARE (*)    All other components within normal limits  SARS CORONAVIRUS 2 (TAT 6-24 HRS)  LIPASE, BLOOD  CBC    EKG EKG Interpretation  Date/Time:  Tuesday March 21 2019 01:31:50 EDT Ventricular Rate:  72 PR Interval:  154 QRS Duration: 82 QT Interval:  418 QTC Calculation: 457 R Axis:   44 Text Interpretation: Normal sinus rhythm Normal ECG No old tracing to compare Confirmed by Deno Etienne [phone]) on 03/21/2019 8:20:44 AM   Radiology CT ABDOMEN PELVIS W CONTRAST  Result Date: 03/21/2019 CLINICAL DATA:  Abdominal pain, nausea, vomiting EXAM: CT ABDOMEN AND PELVIS WITH CONTRAST TECHNIQUE: Multidetector CT imaging of the abdomen and pelvis was performed using the standard protocol following bolus administration of intravenous contrast. CONTRAST:  128mL OMNIPAQUE IOHEXOL 300 MG/ML  SOLN COMPARISON:  None. FINDINGS: Lower chest: Lung bases are clear. No effusions. Heart is normal size.  Hepatobiliary: No focal liver abnormality is seen. Status post cholecystectomy. No biliary dilatation. Pancreas: No focal abnormality or ductal dilatation. Spleen: No focal abnormality.  Normal size. Adrenals/Urinary Tract: No adrenal abnormality. No focal renal abnormality. No stones or hydronephrosis. Urinary bladder is unremarkable. Stomach/Bowel: Postoperative changes in the sigmoid colon. Postoperative changes also in distal small bowel loops. There are dilated fluid-filled and fecalized small bowel loops in the pelvis. Distal small bowel is decompressed. Findings compatible with distal small bowel obstruction. Exact transition point not visualized. Stomach and large bowel decompressed, unremarkable. Vascular/Lymphatic: Aortic atherosclerosis. No enlarged abdominal or pelvic lymph nodes. Reproductive: Prior hysterectomy.  No adnexal masses. Other: No free fluid or free air. Musculoskeletal: No acute bony abnormality. IMPRESSION: Dilated fluid-filled and fecalized small bowel with decompressed distal small bowel. Findings compatible with distal small bowel obstruction. Electronically Signed   By: Rolm Baptise M.D.   On: 03/21/2019 08:12    Procedures Procedures (including critical care time)  Medications Ordered in ED Medications  morphine 4 MG/ML injection 4 mg (has no administration in time range)  sodium chloride 0.9 % bolus 1,000 mL (has no administration in time range)  sodium chloride flush (NS) 0.9 % injection 3 mL (3 mLs Intravenous Given 03/21/19 0705)  ondansetron (ZOFRAN-ODT) disintegrating tablet 4 mg (4 mg Oral Given 03/21/19 0138)  sodium chloride 0.9 % bolus 1,000 mL (1,000 mLs Intravenous Bolus from Bag 03/21/19 0705)  ondansetron (ZOFRAN) injection 4 mg (4 mg Intravenous Given 03/21/19 0705)  HYDROmorphone (DILAUDID) injection 1 mg (1 mg Intravenous Given 03/21/19 0704)  iohexol (OMNIPAQUE) 300 MG/ML solution 100 mL (100 mLs Intravenous Contrast Given 03/21/19 0751)    ED Course  I  have reviewed the triage vital signs and the nursing notes.  Pertinent labs & imaging results that were available during my care of the patient were reviewed by me and considered in my medical decision making (see chart for details).    MDM Rules/Calculators/A&P                      Patient with history of multiple abdominal surgeries and small bowel obstructions presents for evaluation of lower abdominal pain with nausea and vomiting.  She  is afebrile, vital signs are stable.  She is nontoxic in appearance.  No peritoneal signs on examination of the abdomen.  Lab work reviewed and interpreted by myself shows no leukocytosis, no anemia, no metabolic derangements, no renal insufficiency.  Her UA suggest mild dehydration and she was given IV fluids in the ED.  Does not suggest UTI or nephrolithiasis.  CT scan concerning for distal small bowel obstruction.  CONSULT: Spoke with Jerene Pitch, PA-C with general surgery who reviewed patient's images.  She recommends hospitalist admission with general surgery consultation.  We will place an NG tube in the ED.  Spoke with Dr. Tamala Julian with [facility] hospitalist service who agrees to assume care of patient and bring her into the hospital for further evaluation and management. Final Clinical Impression(s) / ED Diagnoses Final diagnoses:  Small bowel obstruction [facility])    Rx / DC Orders ED Discharge Orders    None       Renita Papa, PA-C 03/21/19 [id number]    Orpah Greek, MD 03/25/19 [phone]

## 2019-03-21 NOTE — ED Notes (Signed)
Pt heard repeatedly saying "Oh Don't rupture, not again, don't tear the skin"

## 2019-03-21 NOTE — ED Notes (Signed)
Pt arrived to Rm 51 via stretcher - Pt noted to be alert, cooperative - anxious. Pt aware of tx plan - waiting for Inpt bed. Pt voiced concern d/t waiting in ED for so long. Pt's belongings at bedside - belongings bag, blue bag, and purse. Pt has cell phone w/portable charger. Pt wanted to charge phone but does not have block for wall outlet. TV remote given. NG tube to suction - pt tolerating well. Pt asked to empty bladder - Pure Elza Rafter noted to be intact to pt - Set to suction - Pt to advise RN when has finished. Call bell given.

## 2019-03-22 ENCOUNTER — Inpatient Hospital Stay (HOSPITAL_COMMUNITY): Payer: No Typology Code available for payment source

## 2019-03-22 DIAGNOSIS — G35 Multiple sclerosis: Secondary | ICD-10-CM

## 2019-03-22 LAB — CBC
HCT: 35.1 % — ABNORMAL LOW (ref 36.0–46.0)
Hemoglobin: 11.2 g/dL — ABNORMAL LOW (ref 12.0–15.0)
MCH: 28.9 pg (ref 26.0–34.0)
MCHC: 31.9 g/dL (ref 30.0–36.0)
MCV: 90.7 fL (ref 80.0–100.0)
Platelets: 175 10*3/uL (ref 150–400)
RBC: 3.87 MIL/uL (ref 3.87–5.11)
RDW: 14.4 % (ref 11.5–15.5)
WBC: 4.6 10*3/uL (ref 4.0–10.5)
nRBC: 0 % (ref 0.0–0.2)

## 2019-03-22 LAB — BASIC METABOLIC PANEL
Anion gap: 10 (ref 5–15)
BUN: 17 mg/dL (ref 8–23)
CO2: 24 mmol/L (ref 22–32)
Calcium: 8.4 mg/dL — ABNORMAL LOW (ref 8.9–10.3)
Chloride: 111 mmol/L (ref 98–111)
Creatinine, Ser: 0.8 mg/dL (ref 0.44–1.00)
GFR calc Af Amer: >60 mL/min (ref >60)
GFR calc non Af Amer: >60 mL/min (ref >60)
Glucose, Bld: 96 mg/dL (ref 70–99)
Potassium: 3.5 mmol/L (ref 3.5–5.1)
Sodium: 145 mmol/L (ref 135–145)

## 2019-03-22 IMAGING — DX DG ABD PORTABLE 1V
1 series · 1 of 1 positions shown · non-contrast
Comparison: Radiograph [DATE]

CLINICAL DATA: Small-bowel obstruction, 8 hour delay

EXAM:
PORTABLE ABDOMEN - 1 VIEW

[abdomen kub]
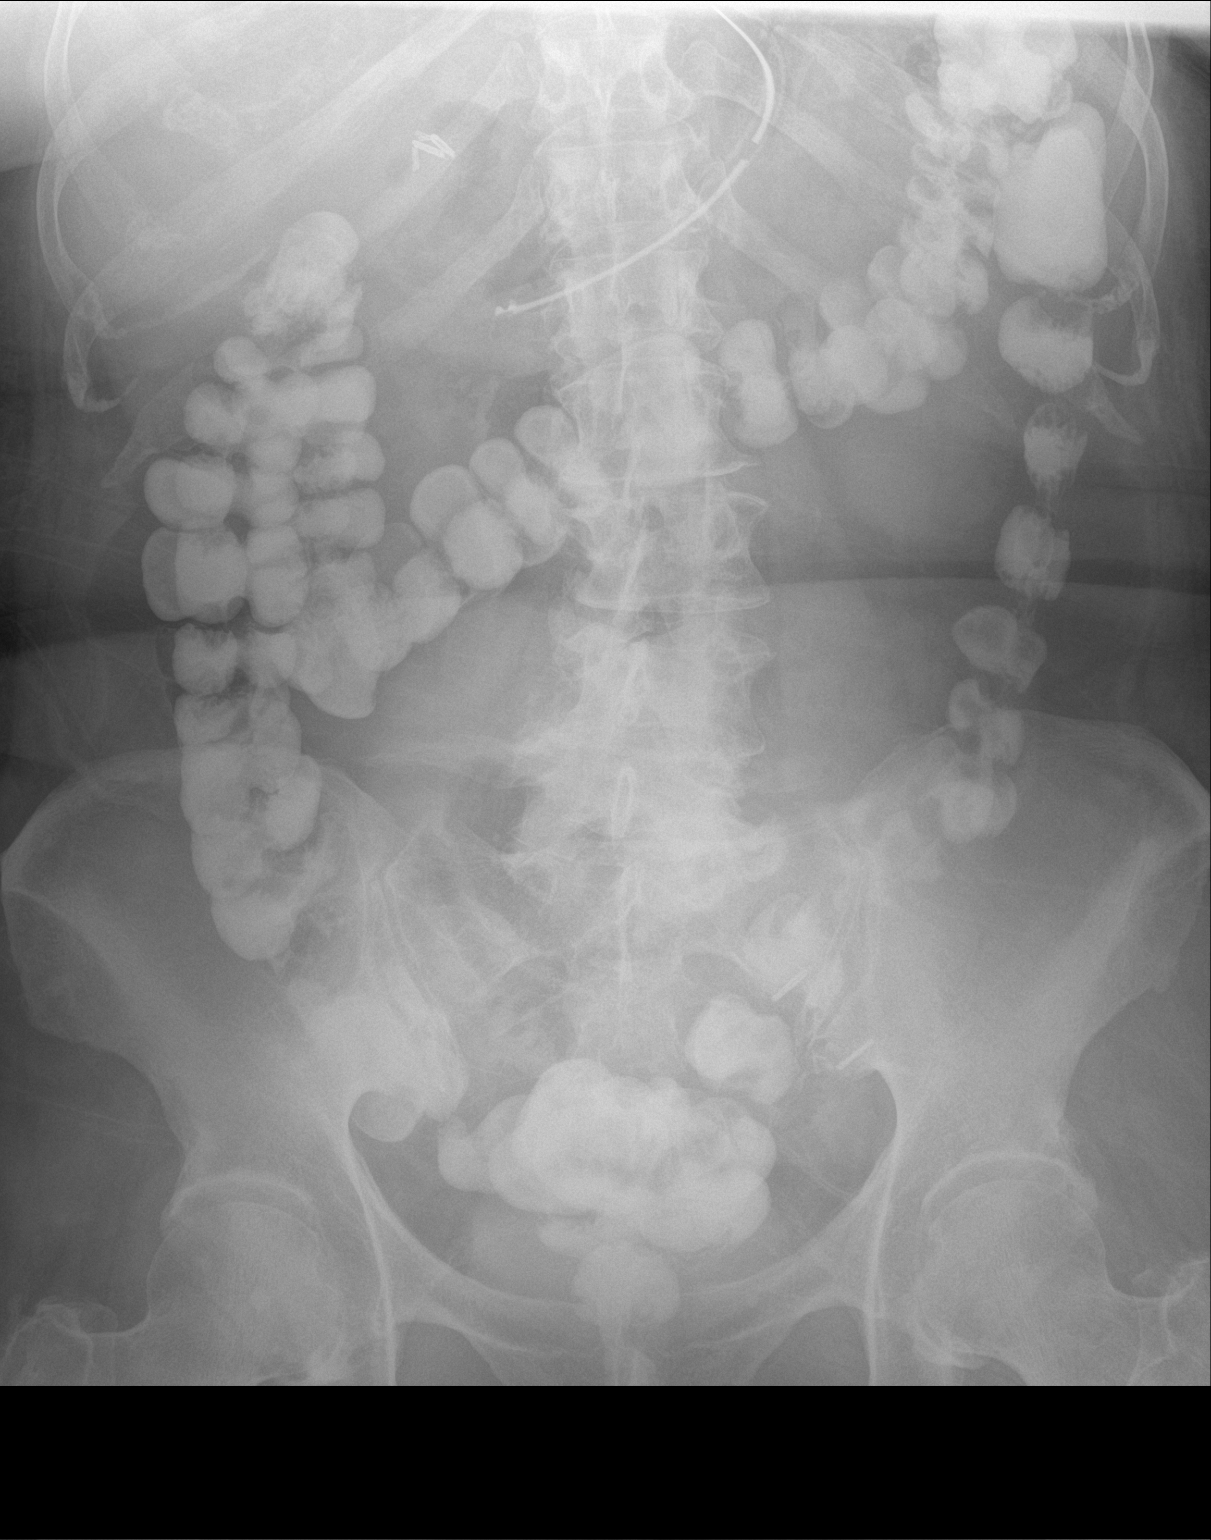

[1 of 1 positions shown; findings below may reference images not displayed]

FINDINGS: High attenuation contrast material has traversed to the level of the
rectum.No high-grade obstructive bowel gas pattern remains. No
suspicious calcifications. Transesophageal tube tip and side port
distal to the GE junction, terminating near the level of the gastric
antrum. Cholecystectomy clips present in the right upper quadrant.
Degenerative changes noted in the spine, hips and pelvis. Mild
levocurvature of the lumbar spine. Osseous structures and soft
tissues are otherwise unremarkable.
IMPRESSION: High attenuation contrast material has traversed to the level of the
rectum. No high-grade obstructive bowel gas pattern remains.

## 2019-03-22 MED ORDER — OXCARBAZEPINE 150 MG PO TABS
150.0000 mg | ORAL_TABLET | Freq: Two times a day (BID) | ORAL | Status: DC
  Filled 2019-03-22 (×2): qty 1

## 2019-03-22 MED ORDER — LOSARTAN POTASSIUM 25 MG PO TABS
25.0000 mg | ORAL_TABLET | Freq: Every day | ORAL | Status: DC
  Administered 2019-03-22: 25 mg via ORAL
  Filled 2019-03-22: qty 1

## 2019-03-22 MED ORDER — ENSURE ENLIVE PO LIQD
237.0000 mL | Freq: Two times a day (BID) | ORAL | Status: DC
  Administered 2019-03-22 (×2): 237 mL via ORAL

## 2019-03-22 MED ORDER — NAPROXEN 250 MG PO TABS: 250.0000 mg | ORAL_TABLET | Freq: Two times a day (BID) | ORAL | Status: DC | PRN

## 2019-03-22 MED ORDER — PRAZOSIN HCL 1 MG PO CAPS
1.0000 mg | ORAL_CAPSULE | Freq: Every day | ORAL | Status: DC
  Filled 2019-03-22: qty 1

## 2019-03-22 NOTE — Discharge Instructions (Signed)
Bowel Obstruction A bowel obstruction is a blockage in the small or large bowel. The bowel, which is also called the intestine, is a long, slender tube that connects the stomach to the anus. When a person eats and drinks, food and fluids go from the mouth to the stomach to the small bowel. This is where most of the nutrients in the food and fluids are absorbed. After the small bowel, material passes through the large bowel for further absorption until any leftover material leaves the body as stool through the anus during a bowel movement. A bowel obstruction will prevent food and fluids from passing through the bowel as they normally do during digestion. The bowel can become partially or completely blocked. If this condition is not treated, it can be dangerous because the bowel could rupture. What are the causes? Common causes of this condition include:  Scar tissue (adhesions) from previous surgery or treatment with high-energy X-rays (radiation).  Recent surgery. This may cause the movements of the bowel to slow down and cause food to block the intestine.  Inflammatory bowel disease, such as Crohn's disease or diverticulitis.  Growths or tumors.  A bulging organ (hernia).  Twisting of the bowel (volvulus).  A foreign body.  Slipping of a part of the bowel into another part (intussusception). What are the signs or symptoms? Symptoms of this condition include:  Pain in the abdomen. Depending on the degree of obstruction, pain may be: ? Mild or severe. ? Dull cramping or sharp pain. ? In one area or in the entire abdomen.  Nausea and vomiting. Vomit may be greenish or a yellow bile color.  Bloating in the abdomen.  Difficulty passing stool (constipation).  Lack of passing gas.  Frequent belching.  Diarrhea. This may occur if the obstruction is partial and runny stool is able to leak around the obstruction. How is this diagnosed? This condition may be diagnosed based on:  A  physical exam.  Medical history.  Imaging tests of the abdomen or pelvis, such as X-ray or CT scan.  Blood or urine tests. How is this treated? Treatment for this condition depends on the cause and severity of the problem. Treatment may include:  Fluids and pain medicines that are given through an IV. Your health care provider may instruct you not to eat or drink if you have nausea or vomiting.  Eating a simple diet. You may be asked to consume a clear liquid diet for several days. This allows the bowel to rest.  Placement of a small tube (nasogastric tube) into the stomach. This will relieve pain, discomfort, and nausea by removing blocked air and fluids from the stomach. It can also help the obstruction clear up faster.  Surgery. This may be required if other treatments do not work. Surgery may be required for: ? Bowel obstruction from a hernia. This can be an emergency procedure. ? Scar tissue that causes frequent or severe obstructions. Follow these instructions at home: Medicines  Take over-the-counter and prescription medicines only as told by your health care provider.  If you were prescribed an antibiotic medicine, take it as told by your health care provider. Do not stop taking the antibiotic even if you start to feel better. General instructions  Follow instructions from your health care provider about eating restrictions. You may need to avoid solid foods and consume only clear liquids until your condition improves.  Return to your normal activities as told by your health care provider. Ask your health care   provider what activities are safe for you.  Avoid sitting for a long time without moving. Get up to take short walks every 1-2 hours. This is important to improve blood flow and breathing. Ask for help if you feel weak or unsteady.  Keep all follow-up visits as told by your health care provider. This is important. How is this prevented? After having a bowel  obstruction, you are more likely to have another. You may do the following things to prevent another obstruction:  If you have a long-term (chronic) disease, pay attention to your symptoms and contact your health care provider if you have questions or concerns.  Avoid becoming constipated. To prevent or treat constipation, your health care provider may recommend that you: ? Drink enough fluid to keep your urine pale yellow. ? Take over-the-counter or prescription medicines. ? Eat foods that are high in fiber, such as beans, whole grains, and fresh fruits and vegetables. ? Limit foods that are high in fat and processed sugars, such as fried or sweet foods.  Stay active. Exercise for 30 minutes or more, 5 or more days each week. Ask your health care provider which exercises are safe for you.  Avoid stress. Find ways to reduce stress, such as meditation, exercise, or taking time for activities that relax you.  Instead of eating three large meals each day, eat three small meals with three small snacks.  Work with a dietitian to make a healthy meal plan that works for you.  Do not use any products that contain nicotine or tobacco, such as cigarettes and e-cigarettes. If you need help quitting, ask your health care provider. Contact a health care provider if you:  Have a fever.  Have chills. Get help right away if you:  Have increased pain or cramping.  Vomit blood.  Have uncontrolled vomiting or nausea.  Cannot drink fluids because of vomiting or pain.  Become confused.  Begin feeling very thirsty (dehydrated).  Have severe bloating.  Feel extremely weak or you faint. Summary  A bowel obstruction is a blockage in the small or large bowel.  A bowel obstruction will prevent food and fluids from passing through the bowel as they normally do during digestion.  Treatment for this condition depends on the cause and severity of the problem. It may include fluids and pain medicines  through an IV, a simple diet, a nasogastric tube, or surgery.  Follow instructions from your health care provider about eating restrictions. You may need to avoid solid foods and consume only clear liquids until your condition improves. This information is not intended to replace advice given to you by your health care provider. Make sure you discuss any questions you have with your health care provider. Document Revised: 01/28/2018 Document Reviewed: 05/05/2017 Elsevier Patient Education  2020 [employer]  

## 2019-03-22 NOTE — Discharge Summary (Signed)
Physician Discharge Summary  Emily Wolfe [id number] DOB: 1955-11-06 DOA: 03/21/2019  PCP: Vivi Barrack, MD  Admit date: 03/21/2019 Discharge date: 03/22/2019  Admitted From: Home  Disposition:  Home   Recommendations for Outpatient Follow-up and new medication changes:  1. Follow up with Dr. Jerline Pain in 7 days.  2. Patient will continue to advance diet as tolerated at home.   Home Health: no   Equipment/Devices: no    Discharge Condition: stable  CODE STATUS:  full Diet recommendation: heart healthy.  Brief/Interim Summary: Patient was admitted to the hospital working diagnosis of small bowel obstruction.  64 year old female who presented with abdominal pain.  She does significant past medical history for hypertension, dyslipidemia, asthma, multiple sclerosis and history of multiple abdominal surgeries in the past, complicated by bowel obstructions, requiring previous resections.  She reported acute onset of abdominal pain, mostly in the right side, colicky in nature.  No bowel movements in 48 hours. On his initial physical examination blood pressure 150/81, heart rate 75, respiratory rate 16, temperature 98, oxygen saturation 100%, she had moist mucous membranes, lungs clear to auscultation bilaterally, heart S1-S2 present rhythmic, her abdomen was tender to palpation right quadrant, decreased bowel sounds, no lower extremity edema. Sodium 139, potassium 4.0, chloride 101, bicarb 25, glucose 135, BUN 11, creatinine 0.86, lipase 20, AST 22, ALT 15, white count 9.5, hemoglobin 13.9, hematocrit 43.0, platelets 240. CT of the abdomen with dilated fluid-filled and fecalized small bowel with decompressed distal small bowel, findings suggestive of distal small bowel obstruction.  EKG 72 bpm, normal axis, normal intervals, sinus rhythm, no ST segment or T wave changes.  Patient received supportive medical therapy with good toleration, including nasogastric tube for decompression,  intravenous fluids and analgesics.  Patient symptoms improved, she was able to move her bowels, she has been considered to be stable to be discharged.  1.  Small bowel obstruction.  Patient was admitted to the medical ward, she had a nasogastric tube placed in emergency department, she received intravenous fluids, IV analgesics, and antiemetics.  Patient had a large bowel movement with significant improvement of her symptoms.  Radiographic abdominal surveillance which showed resolution of bowel obstruction, with contrast in the colon.  Nasogastric tube was removed, her diet was advanced with good toleration, and she has been stable for discharge.  2.  Hypertension.  Her antihypertensive agents were held during her hospitalization, at discharge patient will resume blood pressure control with losartan and prazosin.  3.  Anxiety.  Continue diazepam, and oxcarbamazepine.  4.  Multiple sclerosis.  Continue Gilenya.   Discharge Diagnoses:  Principal Problem:   SBO (small bowel obstruction) (HCC) Active Problems:   Hypertension   Multiple sclerosis ([place]), followed by Dr. Gerald Leitz, seen twice yearly, on Fingolimod    Discharge Instructions   Allergies as of 03/22/2019   No Known Allergies     Medication List    TAKE these medications   azelastine 0.1 % nasal spray Commonly known as: ASTELIN Place 2 sprays into both nostrils 2 (two) times daily.   diazepam 5 MG tablet Commonly known as: VALIUM Take 1 tablet (5 mg total) by mouth at bedtime as needed for anxiety.   diclofenac sodium 1 % Gel Commonly known as: VOLTAREN Apply 2 g topically 4 (four) times daily as needed (knee pain).   Gilenya 0.5 MG Caps Generic drug: Fingolimod HCl Take 1 capsule by mouth daily.   losartan 25 MG tablet Commonly known as: COZAAR Take 1 tablet (25 mg  total) by mouth daily.   MAGNESIUM-CHELATED ZINC PO Take 1 tablet by mouth daily.   naproxen sodium 220 MG tablet Commonly known as:  ALEVE Take 220 mg by mouth daily as needed (arm pain).   OXcarbazepine 150 MG tablet Commonly known as: TRILEPTAL Take 150-300 mg by mouth See admin instructions. Taking 1 tablet in the morning  (150mg ) and  2 tablets  (300mg ) at night   prazosin 1 MG capsule Commonly known as: MINIPRESS Take 1 capsule (1 mg total) by mouth at bedtime.   Vitamin D3 1.25 MG (50000 UT) Caps Take 50,000 Units by mouth once a week. On Sunday       No Known Allergies  Consultations:  Surgery    Procedures/Studies: CT ABDOMEN PELVIS W CONTRAST  Result Date: 03/21/2019 CLINICAL DATA:  Abdominal pain, nausea, vomiting EXAM: CT ABDOMEN AND PELVIS WITH CONTRAST TECHNIQUE: Multidetector CT imaging of the abdomen and pelvis was performed using the standard protocol following bolus administration of intravenous contrast. CONTRAST:  114mL OMNIPAQUE IOHEXOL 300 MG/ML  SOLN COMPARISON:  None. FINDINGS: Lower chest: Lung bases are clear. No effusions. Heart is normal size. Hepatobiliary: No focal liver abnormality is seen. Status post cholecystectomy. No biliary dilatation. Pancreas: No focal abnormality or ductal dilatation. Spleen: No focal abnormality.  Normal size. Adrenals/Urinary Tract: No adrenal abnormality. No focal renal abnormality. No stones or hydronephrosis. Urinary bladder is unremarkable. Stomach/Bowel: Postoperative changes in the sigmoid colon. Postoperative changes also in distal small bowel loops. There are dilated fluid-filled and fecalized small bowel loops in the pelvis. Distal small bowel is decompressed. Findings compatible with distal small bowel obstruction. Exact transition point not visualized. Stomach and large bowel decompressed, unremarkable. Vascular/Lymphatic: Aortic atherosclerosis. No enlarged abdominal or pelvic lymph nodes. Reproductive: Prior hysterectomy.  No adnexal masses. Other: No free fluid or free air. Musculoskeletal: No acute bony abnormality. IMPRESSION: Dilated fluid-filled  and fecalized small bowel with decompressed distal small bowel. Findings compatible with distal small bowel obstruction. Electronically Signed   By: Rolm Baptise M.D.   On: 03/21/2019 08:12   DG Abd Portable 1V-Small Bowel Obstruction Protocol-initial, 8 hr delay  Result Date: 03/22/2019 CLINICAL DATA:  Small-bowel obstruction, 8 hour delay EXAM: PORTABLE ABDOMEN - 1 VIEW COMPARISON:  Radiograph 03/21/2019 FINDINGS: High attenuation contrast material has traversed to the level of the rectum.No high-grade obstructive bowel gas pattern remains. No suspicious calcifications. Transesophageal tube tip and side port distal to the GE junction, terminating near the level of the gastric antrum. Cholecystectomy clips present in the right upper quadrant. Degenerative changes noted in the spine, hips and pelvis. Mild levocurvature of the lumbar spine. Osseous structures and soft tissues are otherwise unremarkable. IMPRESSION: High attenuation contrast material has traversed to the level of the rectum. No high-grade obstructive bowel gas pattern remains. Electronically Signed   By: Lovena Le M.D.   On: 03/22/2019 03:00   DG Abd Portable 1 View  Result Date: 03/21/2019 CLINICAL DATA:  Nasogastric tube placement. EXAM: PORTABLE ABDOMEN - 1 VIEW COMPARISON:  March 21, 2019 (acquired at 12.40 8 p.m.) FINDINGS: A nasogastric tube is seen with its distal tip overlying the expected region of the gastric antrum. The bowel gas pattern is normal. No radio-opaque calculi or other significant radiographic abnormality are seen. Radiopaque surgical clips are seen overlying the right upper quadrant. IMPRESSION: Interval repositioning of the nasogastric tube seen on the prior study. Electronically Signed   By: Virgina Norfolk M.D.   On: 03/21/2019 16:38   DG Abd  Portable 1V-Small Bowel Protocol-Position Verification  Result Date: 03/21/2019 CLINICAL DATA:  Nasogastric tube placement. SBO EXAM: PORTABLE ABDOMEN - 1 VIEW  COMPARISON:  CT 03/21/2019 FINDINGS: Nasogastric tube distal tip terminates just beyond the level of the GE junction with side port in the distal esophagus. Dilated loops of small bowel are partially visualized within the lower abdomen and pelvis. Air and stool are noted throughout the colon. IMPRESSION: Nasogastric tube distal tip terminates just beyond the level of the GE junction with side port in the distal esophagus. Recommend advancement 7-10 cm. Electronically Signed   By: Davina Poke D.O.   On: 03/21/2019 13:15       Subjective: Patient is feeling better, no nausea or vomiting, mild abdominal pain, had large bowel movement last night.   Discharge Exam: Vitals:   03/21/19 2029 03/22/19 0443  BP: (!) 176/83 (!) 163/77  Pulse: 74 78  Resp: 17 18  Temp: 98 F (36.7 C) 98 F (36.7 C)  SpO2: 100% 96%   Vitals:   03/21/19 1230 03/21/19 1753 03/21/19 2029 03/22/19 0443  BP:  (!) 150/87 (!) 176/83 (!) 163/77  Pulse: 69 72 74 78  Resp: 13 16 17 18   Temp:  98.4 F (36.9 C) 98 F (36.7 C) 98 F (36.7 C)  TempSrc:  Oral Oral Oral  SpO2: 96% 96% 100% 96%  Weight:      Height:        General: Not in pain or dyspnea.  Neurology: Awake and alert, non focal  E ENT: no pallor, no icterus, oral mucosa moist Cardiovascular: No JVD. S1-S2 present, rhythmic, no gallops, rubs, or murmurs. No lower extremity edema. Pulmonary: positive breath sounds bilaterally, adequate air movement, no wheezing, rhonchi or rales. Gastrointestinal. Abdomen with no organomegaly, non tender to superficial or deep palpation, no rebound or guarding/ multiple scars.  Skin. No rashes Musculoskeletal: no joint deformities   The results of significant diagnostics from this hospitalization (including imaging, microbiology, ancillary and laboratory) are listed below for reference.     Microbiology: Recent Results (from the past 240 hour(s))  SARS CORONAVIRUS 2 (TAT 6-24 HRS) Nasopharyngeal Nasopharyngeal  Swab     Status: None   Collection Time: 03/21/19 10:54 AM   Specimen: Nasopharyngeal Swab  Result Value Ref Range Status   SARS Coronavirus 2 NEGATIVE NEGATIVE Final    Comment: (NOTE) SARS-CoV-2 target nucleic acids are NOT DETECTED. The SARS-CoV-2 RNA is generally detectable in upper and lower respiratory specimens during the acute phase of infection. Negative results do not preclude SARS-CoV-2 infection, do not rule out co-infections with other pathogens, and should not be used as the sole basis for treatment or other patient management decisions. Negative results must be combined with clinical observations, patient history, and epidemiological information. The expected result is Negative. Fact Sheet for Patients: SugarRoll.be Fact Sheet for Healthcare Providers: https://www.woods-mathews.com/ This test is not yet approved or cleared by the [place] FDA and  has been authorized for detection and/or diagnosis of SARS-CoV-2 by FDA under an Emergency Use Authorization (EUA). This EUA will remain  in effect (meaning this test can be used) for the duration of the COVID-19 declaration under Section 56 4(b)(1) of the Act, 21 U.S.C. section 360bbb-3(b)(1), unless the authorization is terminated or revoked sooner. Performed at [facility], [place] [address] [place], [place] [address]      Labs: BNP (last 3 results) No results for input(s): BNP in the last 8760 hours. Basic Metabolic Panel: Recent Labs  Lab 03/21/19 0206 03/22/19 0236  NA 139 145  K 4.0 3.5  CL 101 111  CO2 25 24  GLUCOSE 155* 96  BUN 11 17  CREATININE 0.86 0.80  CALCIUM 9.6 8.4*   Liver Function Tests: Recent Labs  Lab 03/21/19 0206  AST 22  ALT 15  ALKPHOS 82  BILITOT 0.7  PROT 6.7  ALBUMIN 4.3   Recent Labs  Lab 03/21/19 0206  LIPASE 20   No results for input(s): AMMONIA in the last 168 hours. CBC: Recent Labs  Lab 03/21/19 0206  03/22/19 0236  WBC 9.5 4.6  HGB 13.9 11.2*  HCT 43.0 35.1*  MCV 89.6 90.7  PLT 240 175   Cardiac Enzymes: No results for input(s): CKTOTAL, CKMB, CKMBINDEX, TROPONINI in the last 168 hours. BNP: Invalid input(s): POCBNP CBG: No results for input(s): GLUCAP in the last 168 hours. D-Dimer No results for input(s): DDIMER in the last 72 hours. Hgb A1c No results for input(s): HGBA1C in the last 72 hours. Lipid Profile No results for input(s): CHOL, HDL, LDLCALC, TRIG, CHOLHDL, LDLDIRECT in the last 72 hours. Thyroid function studies No results for input(s): TSH, T4TOTAL, T3FREE, THYROIDAB in the last 72 hours.  Invalid input(s): FREET3 Anemia work up No results for input(s): VITAMINB12, FOLATE, FERRITIN, TIBC, IRON, RETICCTPCT in the last 72 hours. Urinalysis    Component Value Date/Time   COLORURINE YELLOW 03/21/2019 0133   APPEARANCEUR HAZY (A) 03/21/2019 0133   LABSPEC 1.018 03/21/2019 0133   PHURINE 8.0 03/21/2019 0133   GLUCOSEU NEGATIVE 03/21/2019 0133   HGBUR NEGATIVE 03/21/2019 0133   BILIRUBINUR NEGATIVE 03/21/2019 0133   KETONESUR 20 (A) 03/21/2019 0133   PROTEINUR 30 (A) 03/21/2019 0133   NITRITE NEGATIVE 03/21/2019 0133   LEUKOCYTESUR NEGATIVE 03/21/2019 0133   Sepsis Labs Invalid input(s): PROCALCITONIN,  WBC,  LACTICIDVEN Microbiology Recent Results (from the past 240 hour(s))  SARS CORONAVIRUS 2 (TAT 6-24 HRS) Nasopharyngeal Nasopharyngeal Swab     Status: None   Collection Time: 03/21/19 10:54 AM   Specimen: Nasopharyngeal Swab  Result Value Ref Range Status   SARS Coronavirus 2 NEGATIVE NEGATIVE Final    Comment: (NOTE) SARS-CoV-2 target nucleic acids are NOT DETECTED. The SARS-CoV-2 RNA is generally detectable in upper and lower respiratory specimens during the acute phase of infection. Negative results do not preclude SARS-CoV-2 infection, do not rule out co-infections with other pathogens, and should not be used as the sole basis for treatment  or other patient management decisions. Negative results must be combined with clinical observations, patient history, and epidemiological information. The expected result is Negative. Fact Sheet for Patients: SugarRoll.be Fact Sheet for Healthcare Providers: https://www.woods-mathews.com/ This test is not yet approved or cleared by the [place] FDA and  has been authorized for detection and/or diagnosis of SARS-CoV-2 by FDA under an Emergency Use Authorization (EUA). This EUA will remain  in effect (meaning this test can be used) for the duration of the COVID-19 declaration under Section 56 4(b)(1) of the Act, 21 U.S.C. section 360bbb-3(b)(1), unless the authorization is terminated or revoked sooner. Performed at [facility], [place] [address], [place], [place] [address]      Time coordinating discharge: 45 minutes  SIGNED:   Tawni Millers, MD  [facility] 03/22/2019, 12:57 PM

## 2019-03-22 NOTE — Progress Notes (Signed)
Progress Note: General Surgery Service   Chief Complaint/Subjective: Multiple watery BMs overnight, no abdominal pain, no nausea or vomiting. Frustrated that this always happens.  Objective: Vital signs in last 24 hours: Temp:  [98 F (36.7 C)-98.4 F (36.9 C)] 98 F (36.7 C) (03/17 0443) Pulse Rate:  [68-81] 78 (03/17 0443) Resp:  [12-20] 18 (03/17 0443) BP: (150-176)/(77-87) 163/77 (03/17 0443) SpO2:  [94 %-100 %] 96 % (03/17 0443)    Intake/Output from previous day: 03/16 0701 - 03/17 0700 In: 1436.4 [I.V.:1436.4] Out: 100 [Emesis/NG output:100] Intake/Output this shift: No intake/output data recorded.  Gen: NAD  Resp: nonlabored  Card: RRR  Abd: soft, NT, ND  Lab Results: CBC  Recent Labs    03/21/19 0206 03/22/19 0236  WBC 9.5 4.6  HGB 13.9 11.2*  HCT 43.0 35.1*  PLT 240 175   BMET Recent Labs    03/21/19 0206 03/22/19 0236  NA 139 145  K 4.0 3.5  CL 101 111  CO2 25 24  GLUCOSE 155* 96  BUN 11 17  CREATININE 0.86 0.80  CALCIUM 9.6 8.4*   PT/INR No results for input(s): LABPROT, INR in the last 72 hours. ABG No results for input(s): PHART, HCO3 in the last 72 hours.  Invalid input(s): PCO2, PO2  Anti-infectives: Anti-infectives (From admission, onward)   None      Medications: Scheduled Meds: . enoxaparin (LOVENOX) injection  40 mg Subcutaneous Q24H  . feeding supplement (ENSURE ENLIVE)  237 mL Oral BID BM  . losartan  25 mg Oral Daily  . OXcarbazepine  150 mg Oral BID  . prazosin  1 mg Oral QHS   Continuous Infusions: . 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 She feels things have resolved -will quickly advance diet -will restart home medications (do not have gilenya in  hospital formulary) -will recheck with possible discharge in afternoon -discussed with patient about recurrence about how we have tried to empower people in this position to make choices about NG tubes/length of stay when patients know their symptoms well. Discussed lack of benefit of aggressive surgical treatment in the prophylactic setting. She agreed and felt she needed to come with terms as this is the first admission since the 2019 surgery.   LOS: 1 day   Mickeal Skinner, MD [facility]

## 2019-03-22 NOTE — Care Management (Signed)
Pt deemed stable for discharge home today.  CM reviewed chart for TOC needs/orders - none determined.  CM signing off

## 2019-03-24 ENCOUNTER — Other Ambulatory Visit: Payer: Self-pay

## 2019-03-24 ENCOUNTER — Encounter: Payer: Self-pay | Admitting: Physician Assistant

## 2019-03-24 ENCOUNTER — Telehealth: Payer: Self-pay

## 2019-03-24 DIAGNOSIS — K56609 Unspecified intestinal obstruction, unspecified as to partial versus complete obstruction: Secondary | ICD-10-CM

## 2019-03-24 NOTE — Telephone Encounter (Signed)
Ok with referral - looks like it has already been placed.  Algis Greenhouse. Jerline Pain, MD 03/24/2019 4:03 PM

## 2019-03-24 NOTE — Telephone Encounter (Signed)
Patient was hospitalized a few day ago and woudl like to speak with Dr. Jerline Pain before scheduling an appt. Patient states that she need to see GI specialist

## 2019-03-24 NOTE — Telephone Encounter (Signed)
Do you need to schedule a Hospital F/U or Referral to GI. Please advise

## 2019-03-30 ENCOUNTER — Ambulatory Visit (INDEPENDENT_AMBULATORY_CARE_PROVIDER_SITE_OTHER): Payer: No Typology Code available for payment source

## 2019-03-30 DIAGNOSIS — J309 Allergic rhinitis, unspecified: Secondary | ICD-10-CM | POA: Diagnosis not present

## 2019-03-31 ENCOUNTER — Ambulatory Visit: Payer: No Typology Code available for payment source | Attending: Internal Medicine

## 2019-03-31 DIAGNOSIS — Z23 Encounter for immunization: Secondary | ICD-10-CM

## 2019-03-31 NOTE — Progress Notes (Signed)
   Covid-19 Vaccination Clinic  Name:  Emily Wolfe    MRN: [id number] DOB: [birth year]  03/31/2019  Emily Wolfe was observed post Covid-19 immunization for 15 minutes without incident. She was provided with Vaccine Information Sheet and instruction to access the V-Safe system.   Emily Wolfe was instructed to call 911 with any severe reactions post vaccine: Marland Kitchen Difficulty breathing  . Swelling of face and throat  . A fast heartbeat  . A bad rash all over body  . Dizziness and weakness   Immunizations Administered    Name Date Dose VIS Date Route   Pfizer COVID-19 Vaccine 03/31/2019  1:18 PM 0.3 mL 12/16/2018 Intramuscular   Manufacturer: [place]   Lot: [id number]   [place]: [id number]

## 2019-04-05 ENCOUNTER — Ambulatory Visit (INDEPENDENT_AMBULATORY_CARE_PROVIDER_SITE_OTHER): Payer: No Typology Code available for payment source | Admitting: Physician Assistant

## 2019-04-05 ENCOUNTER — Encounter: Payer: Self-pay | Admitting: Physician Assistant

## 2019-04-05 VITALS — BP 122/80 | HR 65 | Temp 97.3°F | Ht 66.0 in | Wt 193.4 lb

## 2019-04-05 DIAGNOSIS — Z8719 Personal history of other diseases of the digestive system: Secondary | ICD-10-CM

## 2019-04-05 NOTE — Patient Instructions (Signed)
If you are age 64 or older, your body mass index should be between 23-30. Your Body mass index is 31.21 kg/m. If this is out of the aforementioned range listed, please consider follow up with your Primary Care Provider.  If you are age 96 or younger, your body mass index should be between 19-25. Your Body mass index is 31.21 kg/m. If this is out of the aformentioned range listed, please consider follow up with your Primary Care Provider.   Follow up as needed with Dr. Fuller Plan or Nicoletta Ba, PA-C.

## 2019-04-06 ENCOUNTER — Ambulatory Visit (INDEPENDENT_AMBULATORY_CARE_PROVIDER_SITE_OTHER): Payer: No Typology Code available for payment source | Admitting: *Deleted

## 2019-04-06 DIAGNOSIS — J309 Allergic rhinitis, unspecified: Secondary | ICD-10-CM

## 2019-04-09 ENCOUNTER — Encounter: Payer: Self-pay | Admitting: Physician Assistant

## 2019-04-09 NOTE — Progress Notes (Addendum)
Subjective:    Patient ID: Emily Wolfe, female    DOB: 05/29/1955, 64 y.o.   MRN: [id number]  HPI Emily Wolfe is a pleasant 63 year old white female, new to GI today self-referred after recent hospitalization for recurrent small bowel obstruction.  She was admitted 3/16 through 03/22/2019 and cared for by the surgical service.  She had quick resolution with NG decompression/conservative management. Patient has history of multiple prior abdominal surgeries.  She has history of ovarian cancer, for which she underwent extensive resection, in 2009 at which time I believe she was living in [place].  She is also status post open cholecystectomy.  She relates several admissions with partial small bowel obstructions over the years, and finally was able to convince a surgeon in [place] to operate on her in 2019 at which time she had extensive lysis of adhesions and resection of small bowel as well as ventral hernia repair with mesh. She says she was doing very well and was excited not to have any bowel obstructions after having recurrent episodes for 10 years.  This most recent episode was the first since her surgery. She says she is still somewhat tender in her abdomen and is eating a very very soft diet to give her bowel some rest.  She denies any ongoing pain at this time, no nausea or vomiting.  She has been having good bowel movements and says she usually takes magnesium supplement which helps her bowels. Patient also suffers from multiple sclerosis which was diagnosed in 28 and has been having a lot of pain issues in her arm and neck recently. She had not established with GI since moving to [place], and this visit is to establish GI care. She says she did have extensive work-up prior to her surgery in 2019 [facility] Dr. Olena Heckle in [place] with endoscopy, colonoscopy and capsule endoscopy.  Review of Systems Pertinent positive and negative review of systems were noted in the above HPI  section.  All other review of systems was otherwise negative.  Outpatient Encounter Medications as of 04/05/2019  Medication Sig  . azelastine (ASTELIN) 0.1 % nasal spray Place 2 sprays into both nostrils 2 (two) times daily.  . Cholecalciferol (VITAMIN D3) 1.25 MG (50000 UT) CAPS Take 50,000 Units by mouth once a week. On Sunday  . diazepam (VALIUM) 5 MG tablet Take 1 tablet (5 mg total) by mouth at bedtime as needed for anxiety.  . diclofenac sodium (VOLTAREN) 1 % GEL Apply 2 g topically 4 (four) times daily as needed (knee pain).   Marland Kitchen GILENYA 0.5 MG CAPS Take 1 capsule by mouth daily.  Marland Kitchen losartan (COZAAR) 25 MG tablet Take 1 tablet (25 mg total) by mouth daily.  . Magnesium-Zinc (MAGNESIUM-CHELATED ZINC PO) Take 1 tablet by mouth daily.  . naproxen sodium (ALEVE) 220 MG tablet Take 220 mg by mouth daily as needed (arm pain).  . OXcarbazepine (TRILEPTAL) 150 MG tablet Take 150-300 mg by mouth See admin instructions. Taking 1 tablet in the morning  (150mg ) and  2 tablets  (300mg ) at night  . prazosin (MINIPRESS) 1 MG capsule Take 1 capsule (1 mg total) by mouth at bedtime.   No facility-administered encounter medications on file as of 04/05/2019.   No Known Allergies Patient Active Problem List   Diagnosis Date Noted  . SBO (small bowel obstruction) ([place]) 03/21/2019  . Nightmares 03/07/2019  . Anxiety 12/20/2018  . Cervicalgia 08/18/2018  . Cough variant asthma vs UACS 01/03/2018  . History of total hysterectomy,  due to ovarian cancer 12/26/2017  . Genital warts 11/19/2017  . History of ovarian cancer 11/19/2017  . Seasonal and perennial allergic rhinitis, followed by Allergist, treated with saline rinses and allergy shots 11/02/2017  . Mild intermittent asthma without complication 0000000  . Hyperlipidemia 06/29/2017  . Hypertension 06/29/2017  . Multiple sclerosis ([place]), followed by Dr. Gerald Leitz, seen twice yearly, on Fingolimod 06/29/2017  . Scoliosis of lumbar spine 06/29/2017    . 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 Vitamin D deficiency 06/29/2017  . 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 Lung mass 06/29/2017  . Colon abnormality 05/11/2017   Social History   Socioeconomic History  . Marital status: Divorced    Spouse name: Not on file  . Number of children: Not on file  . Years of education: Not on file  . Highest education level: Not on file  Occupational History  . Not on file  Tobacco Use  . Smoking status: Never Smoker  . Smokeless tobacco: Never Used  Substance and Sexual Activity  . Alcohol use: Not Currently  . Drug use: Not Currently  . Sexual activity: Not on file  Other Topics Concern  . Not on file  Social History Narrative   The patient does wear seatbelts. The patient does use sunscreen or protective clothing regularly. She does participate in regular exercise. Her exercise is: elliptical, approximately one time per week. She does not have a secured firearm in the home. She denies a history of intimate partner violence.   Social Determinants of Health   Financial Resource Strain:   . Difficulty of Paying Living Expenses:   Food Insecurity:   . Worried About Charity fundraiser in the Last Year:   . Arboriculturist in the Last Year:   Transportation Needs:   . Film/video editor (Medical):   Marland Kitchen Lack of Transportation (Non-Medical):   Physical Activity:   . Days of Exercise per Week:   . Minutes of Exercise per Session:   Stress:   . Feeling of Stress :   Social Connections:   . Frequency of Communication with Friends and Family:   . Frequency of Social Gatherings with Friends and Family:   . Attends Religious Services:   . Active Member of Clubs or Organizations:   . Attends Archivist Meetings:   Marland Kitchen Marital Status:   Intimate Partner Violence:   . Fear of Current or Ex-Partner:   . Emotionally Abused:   Marland Kitchen Physically Abused:   . Sexually Abused:     Emily Wolfe  family history includes Breast cancer in her mother; Cervical cancer in her maternal grandmother.      Objective:    Vitals:   04/05/19 0952  BP: 122/80  Pulse: 65  Temp: (!) 97.3 F (36.3 C)    Physical Exam Well-developed well-nourished older white female in no acute distress.  Height, Weight 193, BMI 31.2  HEENT; nontraumatic normocephalic, EOMI, PER R LA, sclera anicteric. Oropharynx; not examined today Neck; supple, no JVD Cardiovascular; regular rate and rhythm with S1-S2, no murmur rub or gallop Pulmonary; Clear bilaterally Abdomen; soft, nontender, nondistended, no palpable mass or hepatosplenomegaly, bowel sounds are active, multiple incisional scars Rectal; not done today Skin; benign exam, no jaundice rash or appreciable lesions Extremities; no clubbing cyanosis or edema skin warm and dry Neuro/Psych; alert and oriented x4, grossly nonfocal mood and affect appropriate        Assessment &  Plan:   #11 64 year old white female here to establish GI care, after recent hospitalization for recurrent small bowel obstruction. Patient has history of ovarian cancer for which she underwent extensive resection in 2009, she had a ventral and right inguinal hernia repair in 2010, has had multiple episodes of small bowel obstructions, status post cholecystectomy and then had extensive lysis of adhesions, resection of small bowel and repair of ventral hernia with mesh in 2019/[place].  This most recent episode of partial small bowel obstruction was her first in the past 2 years. Symptoms have resolved but she continues to have some abdominal tenderness which is usual for her post obstruction.  #2 multiple sclerosis 3.  Hypertension 4.  Colon cancer surveillance-up-to-date, patient had EGD/colonoscopy and capsule endoscopy 2019, Dr. Melina Modena.  Plan; patient will be established with Dr. Fuller Plan, and happy to see her in follow-up as needed. She has  signed a release and we will obtain her prior GI records to determine interval indication for follow-up colonoscopy. She is very familiar with management of her obstructive symptoms.  She will follow-up in the office as needed.   Addendum; records from [facility] gastroenterology received Capsule endoscopy November 2017 normal EGD October 2017-small hiatal hernia gastritis, normal duodenum.  Biopsy showed chronic inactive gastritis, negative for H. pylori. Colonoscopy October 2017-few nonbleeding diverticuli in the sigmoid colon evidence of prior end-to-side colocolonic anastomosis in the sigmoid colon, surgical changes consistent with left hemicolectomy and widely patent anastomosis, small nonbleeding grade 1 internal hemorrhoids and a single diminutive polyp was removed from the sigmoid colon.  Path consistent with tubular adenoma. She would be indicated for 7-year interval follow-up. Records will be scanned   Emily Wolfe Genia Harold PA-C 04/09/2019   Cc: Vivi Barrack, MD

## 2019-04-17 NOTE — Progress Notes (Signed)
Reviewed and agree with management plan.  Eustacio Ellen T. Nasra Counce, MD FACG [facility]  

## 2019-04-19 ENCOUNTER — Ambulatory Visit (INDEPENDENT_AMBULATORY_CARE_PROVIDER_SITE_OTHER): Payer: No Typology Code available for payment source

## 2019-04-19 DIAGNOSIS — J309 Allergic rhinitis, unspecified: Secondary | ICD-10-CM | POA: Diagnosis not present

## 2019-04-25 ENCOUNTER — Ambulatory Visit: Payer: No Typology Code available for payment source | Attending: Internal Medicine

## 2019-04-25 DIAGNOSIS — Z23 Encounter for immunization: Secondary | ICD-10-CM

## 2019-04-25 NOTE — Progress Notes (Signed)
   Covid-19 Vaccination Clinic  Name:  Lananh Millar    MRN: [id number] DOB: 22-Dec-1955  04/25/2019  Ms. Fiebig was observed post Covid-19 immunization for 15 minutes without incident. She was provided with Vaccine Information Sheet and instruction to access the V-Safe system.   Ms. Pribble was instructed to call 911 with any severe reactions post vaccine: Marland Kitchen Difficulty breathing  . Swelling of face and throat  . A fast heartbeat  . A bad rash all over body  . Dizziness and weakness   Immunizations Administered    Name Date Dose VIS Date Route   Pfizer COVID-19 Vaccine 04/25/2019  9:06 AM 0.3 mL 03/01/2018 Intramuscular   Manufacturer: [place]   Lot: [id number]   [place]: [id number]

## 2019-04-27 DIAGNOSIS — J301 Allergic rhinitis due to pollen: Secondary | ICD-10-CM | POA: Diagnosis not present

## 2019-04-27 NOTE — Progress Notes (Signed)
VIALS EXP 04-26-20 

## 2019-05-04 ENCOUNTER — Ambulatory Visit (INDEPENDENT_AMBULATORY_CARE_PROVIDER_SITE_OTHER): Payer: No Typology Code available for payment source

## 2019-05-04 DIAGNOSIS — J309 Allergic rhinitis, unspecified: Secondary | ICD-10-CM

## 2019-05-12 ENCOUNTER — Ambulatory Visit (INDEPENDENT_AMBULATORY_CARE_PROVIDER_SITE_OTHER): Payer: No Typology Code available for payment source

## 2019-05-12 DIAGNOSIS — J309 Allergic rhinitis, unspecified: Secondary | ICD-10-CM

## 2019-05-18 ENCOUNTER — Ambulatory Visit (INDEPENDENT_AMBULATORY_CARE_PROVIDER_SITE_OTHER): Payer: No Typology Code available for payment source

## 2019-05-18 DIAGNOSIS — J309 Allergic rhinitis, unspecified: Secondary | ICD-10-CM

## 2019-05-25 ENCOUNTER — Ambulatory Visit (INDEPENDENT_AMBULATORY_CARE_PROVIDER_SITE_OTHER): Payer: No Typology Code available for payment source

## 2019-05-25 DIAGNOSIS — J309 Allergic rhinitis, unspecified: Secondary | ICD-10-CM

## 2019-06-01 ENCOUNTER — Ambulatory Visit (INDEPENDENT_AMBULATORY_CARE_PROVIDER_SITE_OTHER): Payer: No Typology Code available for payment source

## 2019-06-01 DIAGNOSIS — J309 Allergic rhinitis, unspecified: Secondary | ICD-10-CM | POA: Diagnosis not present

## 2019-06-08 ENCOUNTER — Ambulatory Visit (INDEPENDENT_AMBULATORY_CARE_PROVIDER_SITE_OTHER): Payer: No Typology Code available for payment source

## 2019-06-08 DIAGNOSIS — J309 Allergic rhinitis, unspecified: Secondary | ICD-10-CM | POA: Diagnosis not present

## 2019-06-15 ENCOUNTER — Ambulatory Visit (INDEPENDENT_AMBULATORY_CARE_PROVIDER_SITE_OTHER): Payer: No Typology Code available for payment source

## 2019-06-15 DIAGNOSIS — J309 Allergic rhinitis, unspecified: Secondary | ICD-10-CM | POA: Diagnosis not present

## 2019-06-22 ENCOUNTER — Ambulatory Visit (INDEPENDENT_AMBULATORY_CARE_PROVIDER_SITE_OTHER): Payer: No Typology Code available for payment source

## 2019-06-22 DIAGNOSIS — J309 Allergic rhinitis, unspecified: Secondary | ICD-10-CM | POA: Diagnosis not present

## 2019-06-29 ENCOUNTER — Ambulatory Visit (INDEPENDENT_AMBULATORY_CARE_PROVIDER_SITE_OTHER): Payer: No Typology Code available for payment source

## 2019-06-29 DIAGNOSIS — J309 Allergic rhinitis, unspecified: Secondary | ICD-10-CM

## 2019-07-06 ENCOUNTER — Ambulatory Visit (INDEPENDENT_AMBULATORY_CARE_PROVIDER_SITE_OTHER): Payer: No Typology Code available for payment source

## 2019-07-06 DIAGNOSIS — J309 Allergic rhinitis, unspecified: Secondary | ICD-10-CM | POA: Diagnosis not present

## 2019-07-11 ENCOUNTER — Other Ambulatory Visit: Payer: Self-pay | Admitting: Family Medicine

## 2019-07-14 ENCOUNTER — Ambulatory Visit (INDEPENDENT_AMBULATORY_CARE_PROVIDER_SITE_OTHER): Payer: No Typology Code available for payment source

## 2019-07-14 DIAGNOSIS — J309 Allergic rhinitis, unspecified: Secondary | ICD-10-CM | POA: Diagnosis not present

## 2019-07-19 ENCOUNTER — Ambulatory Visit (INDEPENDENT_AMBULATORY_CARE_PROVIDER_SITE_OTHER): Payer: No Typology Code available for payment source

## 2019-07-19 DIAGNOSIS — J309 Allergic rhinitis, unspecified: Secondary | ICD-10-CM

## 2019-07-19 NOTE — Progress Notes (Signed)
EXP 07/18/20

## 2019-07-20 DIAGNOSIS — J301 Allergic rhinitis due to pollen: Secondary | ICD-10-CM | POA: Diagnosis not present

## 2019-07-27 ENCOUNTER — Ambulatory Visit (INDEPENDENT_AMBULATORY_CARE_PROVIDER_SITE_OTHER): Payer: No Typology Code available for payment source

## 2019-07-27 DIAGNOSIS — J309 Allergic rhinitis, unspecified: Secondary | ICD-10-CM

## 2019-08-03 ENCOUNTER — Ambulatory Visit (INDEPENDENT_AMBULATORY_CARE_PROVIDER_SITE_OTHER): Payer: No Typology Code available for payment source

## 2019-08-03 DIAGNOSIS — J309 Allergic rhinitis, unspecified: Secondary | ICD-10-CM | POA: Diagnosis not present

## 2019-08-08 ENCOUNTER — Other Ambulatory Visit: Payer: Self-pay | Admitting: Family Medicine

## 2019-08-10 ENCOUNTER — Ambulatory Visit (INDEPENDENT_AMBULATORY_CARE_PROVIDER_SITE_OTHER): Payer: No Typology Code available for payment source

## 2019-08-10 DIAGNOSIS — J309 Allergic rhinitis, unspecified: Secondary | ICD-10-CM

## 2019-08-18 ENCOUNTER — Ambulatory Visit (INDEPENDENT_AMBULATORY_CARE_PROVIDER_SITE_OTHER): Payer: No Typology Code available for payment source | Admitting: *Deleted

## 2019-08-18 DIAGNOSIS — J309 Allergic rhinitis, unspecified: Secondary | ICD-10-CM | POA: Diagnosis not present

## 2019-08-24 ENCOUNTER — Ambulatory Visit (INDEPENDENT_AMBULATORY_CARE_PROVIDER_SITE_OTHER): Payer: No Typology Code available for payment source

## 2019-08-24 DIAGNOSIS — J309 Allergic rhinitis, unspecified: Secondary | ICD-10-CM

## 2019-08-31 ENCOUNTER — Ambulatory Visit (INDEPENDENT_AMBULATORY_CARE_PROVIDER_SITE_OTHER): Payer: No Typology Code available for payment source

## 2019-08-31 DIAGNOSIS — J309 Allergic rhinitis, unspecified: Secondary | ICD-10-CM

## 2019-09-07 ENCOUNTER — Ambulatory Visit (INDEPENDENT_AMBULATORY_CARE_PROVIDER_SITE_OTHER): Payer: No Typology Code available for payment source

## 2019-09-07 DIAGNOSIS — J309 Allergic rhinitis, unspecified: Secondary | ICD-10-CM | POA: Diagnosis not present

## 2019-09-09 ENCOUNTER — Other Ambulatory Visit: Payer: Self-pay | Admitting: Family Medicine

## 2019-09-14 ENCOUNTER — Ambulatory Visit (INDEPENDENT_AMBULATORY_CARE_PROVIDER_SITE_OTHER): Payer: No Typology Code available for payment source

## 2019-09-14 DIAGNOSIS — J309 Allergic rhinitis, unspecified: Secondary | ICD-10-CM | POA: Diagnosis not present

## 2019-09-21 ENCOUNTER — Ambulatory Visit (INDEPENDENT_AMBULATORY_CARE_PROVIDER_SITE_OTHER): Payer: No Typology Code available for payment source | Admitting: *Deleted

## 2019-09-21 DIAGNOSIS — J309 Allergic rhinitis, unspecified: Secondary | ICD-10-CM | POA: Diagnosis not present

## 2019-09-25 ENCOUNTER — Encounter (INDEPENDENT_AMBULATORY_CARE_PROVIDER_SITE_OTHER): Payer: Self-pay | Admitting: Family Medicine

## 2019-09-25 ENCOUNTER — Ambulatory Visit (INDEPENDENT_AMBULATORY_CARE_PROVIDER_SITE_OTHER): Payer: No Typology Code available for payment source | Admitting: Family Medicine

## 2019-09-25 ENCOUNTER — Other Ambulatory Visit: Payer: Self-pay

## 2019-09-25 VITALS — BP 128/78 | HR 77 | Temp 98.2°F | Ht 65.0 in | Wt 187.0 lb

## 2019-09-25 DIAGNOSIS — Z6831 Body mass index (BMI) 31.0-31.9, adult: Secondary | ICD-10-CM

## 2019-09-25 DIAGNOSIS — I10 Essential (primary) hypertension: Secondary | ICD-10-CM | POA: Diagnosis not present

## 2019-09-25 DIAGNOSIS — E559 Vitamin D deficiency, unspecified: Secondary | ICD-10-CM

## 2019-09-25 DIAGNOSIS — R5383 Other fatigue: Secondary | ICD-10-CM | POA: Diagnosis not present

## 2019-09-25 DIAGNOSIS — R0602 Shortness of breath: Secondary | ICD-10-CM | POA: Diagnosis not present

## 2019-09-25 DIAGNOSIS — Z9189 Other specified personal risk factors, not elsewhere classified: Secondary | ICD-10-CM | POA: Diagnosis not present

## 2019-09-25 DIAGNOSIS — R739 Hyperglycemia, unspecified: Secondary | ICD-10-CM

## 2019-09-25 DIAGNOSIS — Z1331 Encounter for screening for depression: Secondary | ICD-10-CM

## 2019-09-25 DIAGNOSIS — E669 Obesity, unspecified: Secondary | ICD-10-CM

## 2019-09-25 DIAGNOSIS — G35 Multiple sclerosis: Secondary | ICD-10-CM

## 2019-09-25 DIAGNOSIS — Z0289 Encounter for other administrative examinations: Secondary | ICD-10-CM

## 2019-09-25 DIAGNOSIS — E7849 Other hyperlipidemia: Secondary | ICD-10-CM

## 2019-09-26 LAB — CBC WITH DIFFERENTIAL/PLATELET
Basophils Absolute: 0.1 10*3/uL (ref 0.0–0.2)
Basos: 1 %
EOS (ABSOLUTE): 0.2 10*3/uL (ref 0.0–0.4)
Eos: 4 %
Hematocrit: 45.4 % (ref 34.0–46.6)
Hemoglobin: 15 g/dL (ref 11.1–15.9)
Immature Grans (Abs): 0 10*3/uL (ref 0.0–0.1)
Immature Granulocytes: 0 %
Lymphocytes Absolute: 0.5 10*3/uL — ABNORMAL LOW (ref 0.7–3.1)
Lymphs: 10 %
MCH: 29.5 pg (ref 26.6–33.0)
MCHC: 33 g/dL (ref 31.5–35.7)
MCV: 89 fL (ref 79–97)
Monocytes Absolute: 0.4 10*3/uL (ref 0.1–0.9)
Monocytes: 7 %
Neutrophils Absolute: 4.2 10*3/uL (ref 1.4–7.0)
Neutrophils: 78 %
Platelets: 206 10*3/uL (ref 150–450)
RBC: 5.08 x10E6/uL (ref 3.77–5.28)
RDW: 12.8 % (ref 11.7–15.4)
WBC: 5.4 10*3/uL (ref 3.4–10.8)

## 2019-09-26 LAB — COMPREHENSIVE METABOLIC PANEL
ALT: 12 IU/L (ref 0–32)
AST: 17 IU/L (ref 0–40)
Albumin/Globulin Ratio: 1.7 (ref 1.2–2.2)
Albumin: 4.3 g/dL (ref 3.8–4.8)
Alkaline Phosphatase: 106 IU/L (ref 44–121)
BUN/Creatinine Ratio: 20 (ref 12–28)
BUN: 18 mg/dL (ref 8–27)
Bilirubin Total: 0.3 mg/dL (ref 0.0–1.2)
CO2: 23 mmol/L (ref 20–29)
Calcium: 9.8 mg/dL (ref 8.7–10.3)
Chloride: 102 mmol/L (ref 96–106)
Creatinine, Ser: 0.91 mg/dL (ref 0.57–1.00)
GFR calc Af Amer: 77 mL/min/{1.73_m2} (ref >59)
GFR calc non Af Amer: 67 mL/min/{1.73_m2} (ref >59)
Globulin, Total: 2.5 g/dL (ref 1.5–4.5)
Glucose: 92 mg/dL (ref 65–99)
Potassium: 4.5 mmol/L (ref 3.5–5.2)
Sodium: 143 mmol/L (ref 134–144)
Total Protein: 6.8 g/dL (ref 6.0–8.5)

## 2019-09-26 LAB — THYROID PANEL WITH TSH
Free Thyroxine Index: 1.4 (ref 1.2–4.9)
T3 Uptake Ratio: 19 % — ABNORMAL LOW (ref 24–39)
T4, Total: 7.5 ug/dL (ref 4.5–12.0)
TSH: 1.85 u[IU]/mL (ref 0.450–4.500)

## 2019-09-26 LAB — HEMOGLOBIN A1C
Est. average glucose Bld gHb Est-mCnc: 108 mg/dL
Hgb A1c MFr Bld: 5.4 % (ref 4.8–5.6)

## 2019-09-26 LAB — LIPID PANEL WITH LDL/HDL RATIO
Cholesterol, Total: 239 mg/dL — ABNORMAL HIGH (ref 100–199)
HDL: 58 mg/dL (ref >39)
LDL Chol Calc (NIH): 153 mg/dL — ABNORMAL HIGH (ref 0–99)
LDL/HDL Ratio: 2.6 ratio (ref 0.0–3.2)
Triglycerides: 155 mg/dL — ABNORMAL HIGH (ref 0–149)
VLDL Cholesterol Cal: 28 mg/dL (ref 5–40)

## 2019-09-26 LAB — INSULIN, RANDOM: INSULIN: 19 u[IU]/mL (ref 2.6–24.9)

## 2019-09-26 LAB — VITAMIN D 25 HYDROXY (VIT D DEFICIENCY, FRACTURES): Vit D, 25-Hydroxy: 78.5 ng/mL (ref 30.0–100.0)

## 2019-09-28 ENCOUNTER — Ambulatory Visit (INDEPENDENT_AMBULATORY_CARE_PROVIDER_SITE_OTHER): Payer: No Typology Code available for payment source | Admitting: *Deleted

## 2019-09-28 DIAGNOSIS — J309 Allergic rhinitis, unspecified: Secondary | ICD-10-CM

## 2019-10-02 NOTE — Progress Notes (Signed)
Chief Complaint:   OBESITY Emily Wolfe (MR# [id number]) is a 64 y.o. female who presents for evaluation and treatment of obesity and related comorbidities. Current BMI is Body mass index is 31.12 kg/m. Emily Wolfe has been struggling with her weight for many years and has been unsuccessful in either losing weight, maintaining weight loss, or reaching her healthy weight goal.  Emily Wolfe heard about our clinic from a friend. She is lactose intolerant, and she has had many health issues in the past 10 years. Previous weight loss achieved with aggressive calorie counting and physical activity.  Emily Wolfe is currently in the action stage of change and ready to dedicate time achieving and maintaining a healthier weight. Emily Wolfe is interested in becoming our patient and working on intensive lifestyle modifications including (but not limited to) diet and exercise for weight loss.  Emily Wolfe's habits were reviewed today and are as follows: her desired weight loss is 37 lbs, she has been heavy most of her life, she started gaining weight after marriage in 1981, her heaviest weight ever was 215 pounds, she has significant food cravings issues, she skips meals frequently, she is trying to follow a vegetarian diet, she is trying to follow a vegan diet, she is frequently drinking liquids with calories, she frequently makes poor food choices, she frequently eats larger portions than normal and she struggles with emotional eating.  Depression Screen Emily Wolfe's Food and Mood (modified PHQ-9) score was 9.  Depression screen PHQ 2/9 09/25/2019  Decreased Interest 1  Down, Depressed, Hopeless 1  PHQ - 2 Score 2  Altered sleeping 2  Tired, decreased energy 2  Change in appetite 2  Feeling bad or failure about yourself  1  Trouble concentrating 0  Moving slowly or fidgety/restless 0  Suicidal thoughts 0  PHQ-9 Score 9  Difficult doing work/chores Somewhat difficult   Subjective:   1. Other  fatigue Emily Wolfe admits to daytime somnolence and admits to waking up still tired. Patent has a history of symptoms of daytime fatigue and morning headache. Emily Wolfe generally gets 4 hours of sleep per night, and states that she has difficulty falling asleep. Snoring is present. Apneic episodes are not present. Epworth Sleepiness Score is 7. EKG-normal sinus rhythm at 71 BPM.  2. SOB (shortness of breath) on exertion Emily Wolfe notes increasing shortness of breath with exercising and seems to be worsening over time with weight gain. She notes getting out of breath sooner with activity than she used to. This has not gotten worse recently. Emily Wolfe denies shortness of breath at rest or orthopnea.  3. Essential hypertension Emily Wolfe's blood pressure is controlled today. She denies chest pain, chest pressure, or headache. She is on losartan.  4. Vitamin Emily Wolfe deficiency Emily Wolfe is on prescription Vit Emily Wolfe, and she notes fatigue.  5. Hyperglycemia Emily Wolfe's glucose level was elevated previously. She denies a diagnosis of pre-diabetes.  6. Multiple sclerosis ([place]) Emily Wolfe sees the head of Neurology at [facility]. She notes recent changes in medications.  7. Other hyperlipidemia Emily Wolfe's last LDL was 140, HDL 63, and triglycerides 123, Total cholesterol was 228. She is not on medications.  8. At risk for osteoporosis Emily Wolfe is at higher risk of osteopenia and osteoporosis due to Vitamin Emily Wolfe deficiency.   Assessment/Plan:   1. Other fatigue Emily Wolfe does feel that her weight is causing her energy to be lower than it should be. Fatigue may be related to obesity, depression or many other causes. Labs will be ordered, and in the meanwhile, Emily Wolfe  will focus on self care including making healthy food choices, increasing physical activity and focusing on stress reduction.  - EKG 12-Lead - Thyroid Panel With TSH - VITAMIN Emily Wolfe 25 Hydroxy (Vit-Emily Wolfe Deficiency, Fractures) - CBC w/Diff/Platelet  2. SOB  (shortness of breath) on exertion Emily Wolfe does feel that she gets out of breath more easily that she used to when she exercises. Emily Wolfe's shortness of breath appears to be obesity related and exercise induced. She has agreed to work on weight loss and gradually increase exercise to treat her exercise induced shortness of breath. Will continue to monitor closely.  3. Essential hypertension Emily Wolfe is working on healthy weight loss and exercise to improve blood pressure control. We will watch for signs of hypotension as she continues her lifestyle modifications. We will check labs today.  - Comprehensive metabolic panel - Lipid Panel With LDL/HDL Ratio  4. Vitamin Emily Wolfe deficiency Low Vitamin Emily Wolfe level contributes to fatigue and are associated with obesity, breast, and colon cancer. We will check labs today. Emily Wolfe will follow-up for routine testing of Vitamin Emily Wolfe, at least 2-3 times per year to avoid over-replacement.  - VITAMIN Emily Wolfe 25 Hydroxy (Vit-Emily Wolfe Deficiency, Fractures)  5. Hyperglycemia Fasting labs will be obtained and results with be discussed with Emily Wolfe in 2 weeks at her follow up visit. In the meanwhile Emily Wolfe was started on a lower simple carbohydrate diet and will work on weight loss efforts.  - Hemoglobin A1c - Insulin, random  6. Multiple sclerosis ([facility]) Emily Wolfe is to follow up with her Neurologist at her previously scheduled appointment.  7. Other hyperlipidemia Cardiovascular risk and specific lipid/LDL goals reviewed. We discussed several lifestyle modifications today and Emily Wolfe will continue to work on diet, exercise and weight loss efforts. We will check labs today. Orders and follow up as documented in patient record.   Counseling Intensive lifestyle modifications are the first line treatment for this issue. . Dietary changes: Increase soluble fiber. Decrease simple carbohydrates. . Exercise changes: Moderate to vigorous-intensity aerobic activity 150 minutes per  week if tolerated. . Lipid-lowering medications: see documented in medical record.  - Lipid Panel With LDL/HDL Ratio  8. Depression screening Emily Wolfe had a positive depression screening. Depression is commonly associated with obesity and often results in emotional eating behaviors. We will monitor this closely and work on CBT to help improve the non-hunger eating patterns. Referral to Psychology may be required if no improvement is seen as she continues in our clinic.  9. At risk for osteoporosis Emily Wolfe was given approximately 15 minutes of osteoporosis prevention counseling today. Emily Wolfe is at risk for osteopenia and osteoporosis due to her Vitamin Emily Wolfe deficiency. She was encouraged to take her Vitamin Emily Wolfe and follow her higher calcium diet and increase strengthening exercise to help strengthen her bones and decrease her risk of osteopenia and osteoporosis.  Repetitive spaced learning was employed today to elicit superior memory formation and behavioral change.  10. Class 1 obesity with serious comorbidity and body mass index (BMI) of 31.0 to 31.9 in adult, unspecified obesity type Emily Wolfe is currently in the action stage of change and her goal is to continue with weight loss efforts. I recommend Doshie begin the structured treatment plan as follows:  She has agreed to the Category 1 Plan with extra cups of veggies.  Exercise goals: No exercise has been prescribed at this time.   Behavioral modification strategies: increasing lean protein intake, meal planning and cooking strategies, keeping healthy foods in the home and planning for success.  She was informed of the importance of frequent follow-up visits to maximize her success with intensive lifestyle modifications for her multiple health conditions. She was informed we would discuss her lab results at her next visit unless there is a critical issue that needs to be addressed sooner. Dulse agreed to keep her next visit at the agreed  upon time to discuss these results.  Objective:   Blood pressure 128/78, pulse 77, temperature 98.2 F (36.8 C), temperature source Oral, height 5\' 5"  (1.651 m), weight 187 lb (84.8 kg), SpO2 96 %. Body mass index is 31.12 kg/m.  EKG: Normal sinus rhythm, rate 71 BPM.  Indirect Calorimeter completed today shows a VO2 of 165 and a REE of 1148.  Her calculated basal metabolic rate is 5462 thus her basal metabolic rate is worse than expected.  General: Cooperative, alert, well developed, in no acute distress. HEENT: Conjunctivae and lids unremarkable. Cardiovascular: Regular rhythm.  Lungs: Normal work of breathing. Neurologic: No focal deficits.   Lab Results  Component Value Date   CREATININE 0.91 09/25/2019   BUN 18 09/25/2019   NA 143 09/25/2019   K 4.5 09/25/2019   CL 102 09/25/2019   CO2 23 09/25/2019   Lab Results  Component Value Date   ALT 12 09/25/2019   AST 17 09/25/2019   ALKPHOS 106 09/25/2019   BILITOT 0.3 09/25/2019   Lab Results  Component Value Date   HGBA1C 5.4 09/25/2019   Lab Results  Component Value Date   INSULIN 19.0 09/25/2019   Lab Results  Component Value Date   TSH 1.850 09/25/2019   Lab Results  Component Value Date   CHOL 239 (H) 09/25/2019   HDL 58 09/25/2019   LDLCALC 153 (H) 09/25/2019   TRIG 155 (H) 09/25/2019   CHOLHDL 4 03/07/2019   Lab Results  Component Value Date   WBC 5.4 09/25/2019   HGB 15.0 09/25/2019   HCT 45.4 09/25/2019   MCV 89 09/25/2019   PLT 206 09/25/2019   No results found for: IRON, TIBC, FERRITIN  Attestation Statements:   Reviewed by clinician on day of visit: allergies, medications, problem list, medical history, surgical history, family history, social history, and previous encounter notes.   I, Trixie Dredge, am acting as transcriptionist for Coralie Common, MD.  This is the patient's first visit at [facility]. The patient's NEW PATIENT PACKET was reviewed at length.  Included in the packet: current and past health history, medications, allergies, ROS, gynecologic history (women only), surgical history, family history, social history, weight history, weight loss surgery history (for those that have had weight loss surgery), nutritional evaluation, mood and food questionnaire, PHQ9, Epworth questionnaire, sleep habits questionnaire, patient life and health improvement goals questionnaire. These will all be scanned into the patient's chart under media.   During the visit, I independently reviewed the patient's EKG, bioimpedance scale results, and indirect calorimeter results. I used this information to tailor a meal plan for the patient that will help her to lose weight and will improve her obesity-related conditions going forward. I performed a medically necessary appropriate examination and/or evaluation. I discussed the assessment and treatment plan with the patient. The patient was provided an opportunity to ask questions and all were answered. The patient agreed with the plan and demonstrated an understanding of the instructions. Labs were ordered at this visit and will be reviewed at the next visit unless more critical results need to be addressed immediately. Clinical information was updated and documented  in the EMR.   Time spent on visit including pre-visit chart review and post-visit care was 46 minutes.   A separate 15 minutes was spent on risk counseling (see above).

## 2019-10-05 ENCOUNTER — Ambulatory Visit (INDEPENDENT_AMBULATORY_CARE_PROVIDER_SITE_OTHER): Payer: No Typology Code available for payment source

## 2019-10-05 DIAGNOSIS — J309 Allergic rhinitis, unspecified: Secondary | ICD-10-CM | POA: Diagnosis not present

## 2019-10-09 ENCOUNTER — Encounter (INDEPENDENT_AMBULATORY_CARE_PROVIDER_SITE_OTHER): Payer: Self-pay | Admitting: Family Medicine

## 2019-10-09 ENCOUNTER — Other Ambulatory Visit: Payer: Self-pay

## 2019-10-09 ENCOUNTER — Ambulatory Visit (INDEPENDENT_AMBULATORY_CARE_PROVIDER_SITE_OTHER): Payer: No Typology Code available for payment source | Admitting: Family Medicine

## 2019-10-09 VITALS — BP 136/80 | HR 68 | Temp 98.3°F | Ht 65.0 in | Wt 182.0 lb

## 2019-10-09 DIAGNOSIS — E7849 Other hyperlipidemia: Secondary | ICD-10-CM | POA: Diagnosis not present

## 2019-10-09 DIAGNOSIS — Z9189 Other specified personal risk factors, not elsewhere classified: Secondary | ICD-10-CM | POA: Diagnosis not present

## 2019-10-09 DIAGNOSIS — E669 Obesity, unspecified: Secondary | ICD-10-CM

## 2019-10-09 DIAGNOSIS — E8881 Metabolic syndrome: Secondary | ICD-10-CM

## 2019-10-09 DIAGNOSIS — E559 Vitamin D deficiency, unspecified: Secondary | ICD-10-CM | POA: Diagnosis not present

## 2019-10-09 DIAGNOSIS — Z6830 Body mass index (BMI) 30.0-30.9, adult: Secondary | ICD-10-CM

## 2019-10-10 NOTE — Progress Notes (Signed)
Chief Complaint:   OBESITY Emily Wolfe is here to discuss her progress with her obesity treatment plan along with follow-up of her obesity related diagnoses. Emily Wolfe is on the Category 1 Plan with extra cups of veggies and states she is following her eating plan approximately 100% of the time. Emily Wolfe states she is doing yard work 7 times per week.  Today's visit was #: 2 Starting weight: 187 lbs Starting date: 09/25/2019 Today's weight: 182 lbs Today's date: 10/09/2019 Total lbs lost to date: 5 Total lbs lost since last in-office visit: 5  Interim History: Emily Wolfe didn't like the meal plan, and she is tried of the meat and wants to incorporate soups, fresh orange juice and more fruit. She also hasn't had a bowel movement in 3 days which is unusual for her. She is wondering about incorporating potatoes into her diet.   Subjective:   1. Other hyperlipidemia Emily Wolfe's last LDL 153, HDL 58, triglycerides 155, and total cholesterol 239. She is not on statin, and her 10 year ASCVD risk of 6.1%, mod intensity statin recommended. I discussed labs with the patient today.  2. Vitamin D deficiency Emily Wolfe denies nausea, vomiting, or muscle weakness. She is on OTC Vit D. I discussed labs with the patient today.  3. Insulin resistance Emily Wolfe's last A1c was 5.4 and insulin 19.0. She is not on medications, and she denies carbohydrate cravings. I discussed labs with the patient today.  4. At risk for diabetes mellitus Emily Wolfe is at higher than average risk for developing diabetes due to her obesity.   Assessment/Plan:   1. Other hyperlipidemia Cardiovascular risk and specific lipid/LDL goals reviewed. We discussed several lifestyle modifications today. Emily Wolfe will continue to work on diet, exercise and weight loss efforts. Orders and follow up as documented in patient record.   Counseling Intensive lifestyle modifications are the first line treatment for this issue. . Dietary  changes: Increase soluble fiber. Decrease simple carbohydrates. . Exercise changes: Moderate to vigorous-intensity aerobic activity 150 minutes per week if tolerated. . Lipid-lowering medications: see documented in medical record.  2. Vitamin D deficiency Low Vitamin D level contributes to fatigue and are associated with obesity, breast, and colon cancer. Emily Wolfe agreed to continue taking Vitamin D replacement. She will follow-up for routine testing of Vitamin D, at least 2-3 times per year to avoid over-replacement.  3. Insulin resistance [name] will continue to work on weight loss, exercise, and decreasing simple carbohydrates to help decrease the risk of diabetes. we will repeat labs in 3 months, and no medications were prescribed at this time. Emily Wolfe agreed to follow-up with [place] as directed to closely monitor her progress.  4. At risk for diabetes mellitus Emily Wolfe was given approximately 30 minutes of diabetes education and counseling today. We discussed intensive lifestyle modifications today with an emphasis on weight loss as well as increasing exercise and decreasing simple carbohydrates in her diet. We also reviewed medication options with an emphasis on risk versus benefit of those discussed.   Repetitive spaced learning was employed today to elicit superior memory formation and behavioral change.  5. Class 1 obesity with serious comorbidity and body mass index (BMI) of 30.0 to 30.9 in adult, unspecified obesity type Emily Wolfe is currently in the action stage of change. As such, her goal is to continue with weight loss efforts. She has agreed to keeping a food journal and adhering to recommended goals of 1000-1100 calories and 70+ grams of protein daily.   Exercise goals: As is.  Behavioral modification strategies: increasing lean protein intake, meal planning and cooking strategies, keeping healthy foods in the home and planning for success.  Emily Wolfe has agreed to follow-up with  our clinic in 2 weeks. She was informed of the importance of frequent follow-up visits to maximize her success with intensive lifestyle modifications for her multiple health conditions.   Objective:   Blood pressure 136/80, pulse 68, temperature 98.3 F (36.8 C), temperature source Oral, height 5\' 5"  (1.651 m), weight 182 lb (82.6 kg), SpO2 98 %. Body mass index is 30.29 kg/m.  General: Cooperative, alert, well developed, in no acute distress. HEENT: Conjunctivae and lids unremarkable. Cardiovascular: Regular rhythm.  Lungs: Normal work of breathing. Neurologic: No focal deficits.   Lab Results  Component Value Date   CREATININE 0.91 09/25/2019   BUN 18 09/25/2019   NA 143 09/25/2019   K 4.5 09/25/2019   CL 102 09/25/2019   CO2 23 09/25/2019   Lab Results  Component Value Date   ALT 12 09/25/2019   AST 17 09/25/2019   ALKPHOS 106 09/25/2019   BILITOT 0.3 09/25/2019   Lab Results  Component Value Date   HGBA1C 5.4 09/25/2019   Lab Results  Component Value Date   INSULIN 19.0 09/25/2019   Lab Results  Component Value Date   TSH 1.850 09/25/2019   Lab Results  Component Value Date   CHOL 239 (H) 09/25/2019   HDL 58 09/25/2019   LDLCALC 153 (H) 09/25/2019   TRIG 155 (H) 09/25/2019   CHOLHDL 4 03/07/2019   Lab Results  Component Value Date   WBC 5.4 09/25/2019   HGB 15.0 09/25/2019   HCT 45.4 09/25/2019   MCV 89 09/25/2019   PLT 206 09/25/2019   No results found for: IRON, TIBC, FERRITIN  Attestation Statements:   Reviewed by clinician on day of visit: allergies, medications, problem list, medical history, surgical history, family history, social history, and previous encounter notes.   I, Trixie Dredge, am acting as transcriptionist for Coralie Common, MD. I have reviewed the above documentation for accuracy and completeness, and I agree with the above. - Jinny Blossom, MD

## 2019-10-11 DIAGNOSIS — J3089 Other allergic rhinitis: Secondary | ICD-10-CM

## 2019-10-11 NOTE — Progress Notes (Signed)
VIALS EXP 10-10-20 

## 2019-10-12 ENCOUNTER — Ambulatory Visit (INDEPENDENT_AMBULATORY_CARE_PROVIDER_SITE_OTHER): Payer: No Typology Code available for payment source

## 2019-10-12 DIAGNOSIS — J309 Allergic rhinitis, unspecified: Secondary | ICD-10-CM

## 2019-10-18 DIAGNOSIS — J301 Allergic rhinitis due to pollen: Secondary | ICD-10-CM | POA: Diagnosis not present

## 2019-10-18 NOTE — Progress Notes (Signed)
ADDITIONAL LABEL NEEDED 

## 2019-10-19 ENCOUNTER — Ambulatory Visit (INDEPENDENT_AMBULATORY_CARE_PROVIDER_SITE_OTHER): Payer: No Typology Code available for payment source

## 2019-10-19 DIAGNOSIS — J309 Allergic rhinitis, unspecified: Secondary | ICD-10-CM | POA: Diagnosis not present

## 2019-10-26 ENCOUNTER — Ambulatory Visit (INDEPENDENT_AMBULATORY_CARE_PROVIDER_SITE_OTHER): Payer: No Typology Code available for payment source | Admitting: *Deleted

## 2019-10-26 DIAGNOSIS — J309 Allergic rhinitis, unspecified: Secondary | ICD-10-CM | POA: Diagnosis not present

## 2019-10-31 ENCOUNTER — Other Ambulatory Visit: Payer: Self-pay

## 2019-10-31 ENCOUNTER — Encounter (INDEPENDENT_AMBULATORY_CARE_PROVIDER_SITE_OTHER): Payer: Self-pay | Admitting: Family Medicine

## 2019-10-31 ENCOUNTER — Ambulatory Visit (INDEPENDENT_AMBULATORY_CARE_PROVIDER_SITE_OTHER): Payer: No Typology Code available for payment source | Admitting: Family Medicine

## 2019-10-31 VITALS — BP 107/70 | HR 77 | Temp 97.7°F | Ht 65.0 in | Wt 176.0 lb

## 2019-10-31 DIAGNOSIS — E8881 Metabolic syndrome: Secondary | ICD-10-CM | POA: Diagnosis not present

## 2019-10-31 DIAGNOSIS — I10 Essential (primary) hypertension: Secondary | ICD-10-CM

## 2019-10-31 DIAGNOSIS — E7849 Other hyperlipidemia: Secondary | ICD-10-CM | POA: Diagnosis not present

## 2019-10-31 DIAGNOSIS — Z6830 Body mass index (BMI) 30.0-30.9, adult: Secondary | ICD-10-CM

## 2019-10-31 DIAGNOSIS — E669 Obesity, unspecified: Secondary | ICD-10-CM

## 2019-11-02 ENCOUNTER — Ambulatory Visit (INDEPENDENT_AMBULATORY_CARE_PROVIDER_SITE_OTHER): Payer: No Typology Code available for payment source | Admitting: *Deleted

## 2019-11-02 DIAGNOSIS — J309 Allergic rhinitis, unspecified: Secondary | ICD-10-CM | POA: Diagnosis not present

## 2019-11-02 NOTE — Progress Notes (Signed)
Chief Complaint:   OBESITY Emily Wolfe is here to discuss her progress with her obesity treatment plan along with follow-up of her obesity related diagnoses. Emily Wolfe is on keeping a food journal and adhering to recommended goals of 1000-1100 calories and 70+ grams of protein daily and states she is following her eating plan approximately 99.9% of the time. Emily Wolfe states she is active while doing a lot of yard work.   Today's visit was #: 3 Starting weight: 187 lbs Starting date: 09/25/2019 Today's weight: 176 lbs Today's date: 10/31/2019 Total lbs lost to date: 11 Total lbs lost since last in-office visit: 6  Interim History: Emily Wolfe has been doing significant amounts of lawn work over the last few weeks. Switched out meat with lentil eggplant stew, but otherwise sticking to the meal plan. No abnormal hunger just hungry when she is supposed to be.  Subjective:   1. Insulin resistance Emily Wolfe's last A1c was 5.4 and insulin 19.0 She is not on medications.  2. Other hyperlipidemia Emily Wolfe's last LDL was 153, HDL 58, and triglycerides 155. She is not on statin.  3. Essential hypertension Emily Wolfe's blood pressure is well controlled today. She denies chest pain, chest pressure, or headache, dizziness, or lightheadedness.  Assessment/Plan:   1. Insulin resistance Emily Wolfe will continue to work on weight loss, exercise, and decreasing simple carbohydrates to help decrease the risk of diabetes. We will repeat labs in January 2022. Emily Wolfe agreed to follow-up with Emily Wolfe as directed to closely monitor her progress.  2. Other hyperlipidemia Cardiovascular risk and specific lipid/LDL goals reviewed. We discussed several lifestyle modifications today and Emily Wolfe will continue to work on diet, exercise and weight loss efforts. We will repeat labs in January 2022. Orders and follow up as documented in patient record.   Counseling Intensive lifestyle modifications are the first line  treatment for this issue. . Dietary changes: Increase soluble fiber. Decrease simple carbohydrates. . Exercise changes: Moderate to vigorous-intensity aerobic activity 150 minutes per week if tolerated. . Lipid-lowering medications: see documented in medical record.  3. Essential hypertension Emily Wolfe is working on healthy weight loss and exercise to improve blood pressure control. We will watch for signs of hypotension as she continues her lifestyle modifications. Emily Wolfe will continue losartan, if blood pressure is well controlled again next appointment then we will stop losartan.  4. Class 1 obesity with serious comorbidity and body mass index (BMI) of 30.0 to 30.9 in adult, unspecified obesity type Emily Wolfe is currently in the action stage of change. As such, her goal is to continue with weight loss efforts. She has agreed to the Category 1 Plan with extra veggies.   Exercise goals: As is.  Behavioral modification strategies: increasing lean protein intake, meal planning and cooking strategies, keeping healthy foods in the home and planning for success.  Emily Wolfe has agreed to follow-up with our clinic in 2 weeks. She was informed of the importance of frequent follow-up visits to maximize her success with intensive lifestyle modifications for her multiple health conditions.   Objective:   Blood pressure 107/70, pulse 77, temperature 97.7 F (36.5 C), temperature source Oral, height 5\' 5"  (1.651 m), weight 176 lb (79.8 kg), SpO2 98 %. Body mass index is 29.29 kg/m.  General: Cooperative, alert, well developed, in no acute distress. HEENT: Conjunctivae and lids unremarkable. Cardiovascular: Regular rhythm.  Lungs: Normal work of breathing. Neurologic: No focal deficits.   Lab Results  Component Value Date   CREATININE 0.91 09/25/2019   BUN 18 09/25/2019  NA 143 09/25/2019   K 4.5 09/25/2019   CL 102 09/25/2019   CO2 23 09/25/2019   Lab Results  Component Value Date   ALT  12 09/25/2019   AST 17 09/25/2019   ALKPHOS 106 09/25/2019   BILITOT 0.3 09/25/2019   Lab Results  Component Value Date   HGBA1C 5.4 09/25/2019   Lab Results  Component Value Date   INSULIN 19.0 09/25/2019   Lab Results  Component Value Date   TSH 1.850 09/25/2019   Lab Results  Component Value Date   CHOL 239 (H) 09/25/2019   HDL 58 09/25/2019   LDLCALC 153 (H) 09/25/2019   TRIG 155 (H) 09/25/2019   CHOLHDL 4 03/07/2019   Lab Results  Component Value Date   WBC 5.4 09/25/2019   HGB 15.0 09/25/2019   HCT 45.4 09/25/2019   MCV 89 09/25/2019   PLT 206 09/25/2019   No results found for: IRON, TIBC, FERRITIN  Attestation Statements:   Reviewed by clinician on day of visit: allergies, medications, problem list, medical history, surgical history, family history, social history, and previous encounter notes.  Time spent on visit including pre-visit chart review and post-visit care and charting was 16 minutes.    I, Trixie Dredge, am acting as transcriptionist for Coralie Common, MD.  I have reviewed the above documentation for accuracy and completeness, and I agree with the above. - Jinny Blossom, MD

## 2019-11-15 ENCOUNTER — Ambulatory Visit (INDEPENDENT_AMBULATORY_CARE_PROVIDER_SITE_OTHER): Payer: No Typology Code available for payment source | Admitting: *Deleted

## 2019-11-15 DIAGNOSIS — J309 Allergic rhinitis, unspecified: Secondary | ICD-10-CM | POA: Diagnosis not present

## 2019-11-20 ENCOUNTER — Encounter (INDEPENDENT_AMBULATORY_CARE_PROVIDER_SITE_OTHER): Payer: Self-pay | Admitting: Family Medicine

## 2019-11-20 ENCOUNTER — Other Ambulatory Visit: Payer: Self-pay

## 2019-11-20 ENCOUNTER — Ambulatory Visit (INDEPENDENT_AMBULATORY_CARE_PROVIDER_SITE_OTHER): Payer: No Typology Code available for payment source | Admitting: Family Medicine

## 2019-11-20 VITALS — BP 126/77 | HR 71 | Temp 98.4°F | Ht 65.0 in | Wt 170.0 lb

## 2019-11-20 DIAGNOSIS — E559 Vitamin D deficiency, unspecified: Secondary | ICD-10-CM | POA: Diagnosis not present

## 2019-11-20 DIAGNOSIS — G35 Multiple sclerosis: Secondary | ICD-10-CM

## 2019-11-20 DIAGNOSIS — Z6830 Body mass index (BMI) 30.0-30.9, adult: Secondary | ICD-10-CM

## 2019-11-20 DIAGNOSIS — R948 Abnormal results of function studies of other organs and systems: Secondary | ICD-10-CM

## 2019-11-20 DIAGNOSIS — E669 Obesity, unspecified: Secondary | ICD-10-CM | POA: Diagnosis not present

## 2019-11-20 DIAGNOSIS — G35D Multiple sclerosis, unspecified: Secondary | ICD-10-CM

## 2019-11-23 NOTE — Progress Notes (Signed)
Chief Complaint:   OBESITY Emily Wolfe is here to discuss her progress with her obesity treatment plan along with follow-up of her obesity related diagnoses.   Today's visit was #: 4 Starting weight: 187 lbs Starting date: 09/25/2019 Today's weight: 170 lbs Today's date: 11/20/2019 Total lbs lost to date: 17 lbs Body mass index is 28.29 kg/m.  Total weight loss percentage to date: -9.09%  Interim History: [name] says she is enjoying growing vegetables.  She is likely moving in April.  She is considering [place].  RMR is 1148.  She is using the stair stepper daily. Nutrition Plan: the Category 1 Plan with extra vegetables for 75% of the time.  Hunger is well controlled controlled. Cravings are well controlled controlled.  Activity: Stair stepper for 30 minutes 7 times per week.  Assessment/Plan:   1. Multiple sclerosis ([place]) Arayah sees the head of Neurology at [facility]. The current medical regimen is effective;  continue present plan and medications.  2. Abnormal metabolism [name]'s metabolism is slower than expected. We will continue to focus on increasing protein in the diet and increasing muscle mass to increase her RMR.  "We therefore conclude that weight-loss therapy, including a hypocaloric diet with adequate (but not excessive) protein intake, and physical activity, particularly resistance exercise-type training, should be promoted to maintain muscle mass and improve muscle strength and physical function in persons with obesity." - Preserving Healthy Muscle during Weight Loss. [name], [name], and [employer]. [facility], [facility], [place], [place].  3. Vitamin D deficiency At goal. Current vitamin D is 78.5, tested on 09/25/2019.   Plan:  []   Continue Vitamin D @50 ,000 IU every week. [x]   Continue home supplement daily. [x]   Follow-up for routine testing of Vitamin D at least 2-3  times per year to avoid over-replacement.  4. Class 1 obesity with serious comorbidity and body mass index (BMI) of 30.0 to 30.9 in adult, unspecified obesity type  Course: [name] is currently in the action stage of change. As such, her goal is to continue with weight loss efforts.   Nutrition goals: She has agreed to practicing portion control and making smarter food choices, such as increasing vegetables and decreasing simple carbohydrates.   Exercise goals: As tolerated.  Discussed PT pilates.  Behavioral modification strategies: increasing lean protein intake and increasing water intake.  [name] has agreed to follow-up with our clinic in 3 weeks. She was informed of the importance of frequent follow-up visits to maximize her success with intensive lifestyle modifications for her multiple health conditions.   Objective:   Blood pressure 126/77, pulse 71, temperature 98.4 F (36.9 C), height 5\' 5"  (1.651 m), weight 170 lb (77.1 kg), SpO2 98 %. Body mass index is 28.29 kg/m.  General: Cooperative, alert, well developed, in no acute distress. HEENT: Conjunctivae and lids unremarkable. Cardiovascular: Regular rhythm.  Lungs: Normal work of breathing. Neurologic: No focal deficits.   Lab Results  Component Value Date   CREATININE 0.91 09/25/2019   BUN 18 09/25/2019   NA 143 09/25/2019   K 4.5 09/25/2019   CL 102 09/25/2019   CO2 23 09/25/2019   Lab Results  Component Value Date   ALT 12 09/25/2019   AST 17 09/25/2019   ALKPHOS 106 09/25/2019   BILITOT 0.3 09/25/2019   Lab Results  Component Value Date   HGBA1C 5.4 09/25/2019   Lab Results  Component Value Date   INSULIN 19.0  09/25/2019   Lab Results  Component Value Date   TSH 1.850 09/25/2019   Lab Results  Component Value Date   CHOL 239 (H) 09/25/2019   HDL 58 09/25/2019   LDLCALC 153 (H) 09/25/2019   TRIG 155 (H) 09/25/2019   CHOLHDL 4 03/07/2019   Lab Results  Component Value Date   WBC 5.4  09/25/2019   HGB 15.0 09/25/2019   HCT 45.4 09/25/2019   MCV 89 09/25/2019   PLT 206 09/25/2019   Attestation Statements:   Reviewed by clinician on day of visit: allergies, medications, problem list, medical history, surgical history, family history, social history, and previous encounter notes.  I, Water quality scientist, CMA, am acting as transcriptionist for Briscoe Deutscher, DO  I have reviewed the above documentation for accuracy and completeness, and I agree with the above. Briscoe Deutscher, DO

## 2019-11-24 ENCOUNTER — Ambulatory Visit (INDEPENDENT_AMBULATORY_CARE_PROVIDER_SITE_OTHER): Payer: No Typology Code available for payment source

## 2019-11-24 DIAGNOSIS — J309 Allergic rhinitis, unspecified: Secondary | ICD-10-CM | POA: Diagnosis not present

## 2019-11-29 ENCOUNTER — Ambulatory Visit (INDEPENDENT_AMBULATORY_CARE_PROVIDER_SITE_OTHER): Payer: No Typology Code available for payment source | Admitting: *Deleted

## 2019-11-29 DIAGNOSIS — J309 Allergic rhinitis, unspecified: Secondary | ICD-10-CM | POA: Diagnosis not present

## 2019-12-07 ENCOUNTER — Ambulatory Visit (INDEPENDENT_AMBULATORY_CARE_PROVIDER_SITE_OTHER): Payer: No Typology Code available for payment source

## 2019-12-07 DIAGNOSIS — J309 Allergic rhinitis, unspecified: Secondary | ICD-10-CM

## 2019-12-08 ENCOUNTER — Other Ambulatory Visit: Payer: Self-pay | Admitting: Family Medicine

## 2019-12-10 ENCOUNTER — Other Ambulatory Visit: Payer: Self-pay | Admitting: Family Medicine

## 2019-12-12 ENCOUNTER — Ambulatory Visit (INDEPENDENT_AMBULATORY_CARE_PROVIDER_SITE_OTHER): Payer: No Typology Code available for payment source | Admitting: Family Medicine

## 2019-12-12 ENCOUNTER — Encounter (INDEPENDENT_AMBULATORY_CARE_PROVIDER_SITE_OTHER): Payer: Self-pay | Admitting: Family Medicine

## 2019-12-12 ENCOUNTER — Other Ambulatory Visit: Payer: Self-pay

## 2019-12-12 VITALS — BP 128/74 | HR 62 | Temp 97.6°F | Ht 65.0 in | Wt 165.0 lb

## 2019-12-12 DIAGNOSIS — I10 Essential (primary) hypertension: Secondary | ICD-10-CM

## 2019-12-12 DIAGNOSIS — E8881 Metabolic syndrome: Secondary | ICD-10-CM

## 2019-12-12 DIAGNOSIS — G35 Multiple sclerosis: Secondary | ICD-10-CM | POA: Diagnosis not present

## 2019-12-12 DIAGNOSIS — E669 Obesity, unspecified: Secondary | ICD-10-CM

## 2019-12-12 DIAGNOSIS — G4709 Other insomnia: Secondary | ICD-10-CM | POA: Diagnosis not present

## 2019-12-12 DIAGNOSIS — Z6830 Body mass index (BMI) 30.0-30.9, adult: Secondary | ICD-10-CM

## 2019-12-13 NOTE — Progress Notes (Signed)
Chief Complaint:   OBESITY Emily Wolfe is here to discuss her progress with her obesity treatment plan along with follow-up of her obesity related diagnoses.   Today's visit was #: 5 Starting weight: 187 lbs Starting date: 09/25/2019 Today's weight: 165 lbs Today's date: 12/12/2019 Total lbs lost to date: 22 lbs Body mass index is 27.46 kg/m.  Total weight loss percentage to date: -11.76%  Interim History: Emily Wolfe says she has an appointment next week with PT Pilates.  She has been using her mini stepper daily. Nutrition Plan: practicing portion control and making smarter food choices, such as increasing vegetables and decreasing simple carbohydrates for 90% of the time.  Activity: Cardio for 30 minutes 7 times per week.  Assessment/Plan:   1. Essential hypertension At goal. Medications: Cozaar 25 mg daily.   Plan: Avoid buying foods that are: processed, frozen, or prepackaged to avoid excess salt. We will continue to monitor symptoms as they relate to her weight loss journey.  BP Readings from Last 3 Encounters:  12/12/19 128/74  11/20/19 126/77  10/31/19 107/70   Lab Results  Component Value Date   CREATININE 0.91 09/25/2019   2. Multiple sclerosis ([facility]) Emily Wolfe will be going to PT Pilates soon.  She is currently taking Arava 10 mg daily. We will continue to monitor symptoms as they relate to her weight loss journey.  3. Insulin resistance At goal. Goal is HgbA1c < 5.7, fasting insulin closer to 5.  She will continue to focus on protein-rich, low simple carbohydrate foods. We reviewed the importance of hydration, regular exercise for stress reduction, and restorative sleep.   Lab Results  Component Value Date   HGBA1C 5.4 09/25/2019   Lab Results  Component Value Date   INSULIN 19.0 09/25/2019   4. Other insomnia Emily Wolfe is open to all treatment. Plan: Recommend sleep hygiene measures including regular sleep schedule, optimal sleep environment, and relaxing  presleep rituals.  5. Class 1 obesity with serious comorbidity and body mass index (BMI) of 30.0 to 30.9 in adult, unspecified obesity type  Course: Emily Wolfe is currently in the action stage of change. As such, her goal is to continue with weight loss efforts.   Nutrition goals: She has agreed to practicing portion control and making smarter food choices, such as increasing vegetables and decreasing simple carbohydrates.   Exercise goals: For substantial health benefits, adults should do at least 150 minutes (2 hours and 30 minutes) a week of moderate-intensity, or 75 minutes (1 hour and 15 minutes) a week of vigorous-intensity aerobic physical activity, or an equivalent combination of moderate- and vigorous-intensity aerobic activity. Aerobic activity should be performed in episodes of at least 10 minutes, and preferably, it should be spread throughout the week.  Behavioral modification strategies: increasing lean protein intake, decreasing simple carbohydrates, increasing vegetables and increasing water intake.  Emily Wolfe has agreed to follow-up with our clinic in 4 weeks. She was informed of the importance of frequent follow-up visits to maximize her success with intensive lifestyle modifications for her multiple health conditions.   Objective:   Blood pressure 128/74, pulse 62, temperature 97.6 F (36.4 C), temperature source Oral, height 5\' 5"  (1.651 m), weight 165 lb (74.8 kg), SpO2 96 %. Body mass index is 27.46 kg/m.  General: Cooperative, alert, well developed, in no acute distress. HEENT: Conjunctivae and lids unremarkable. Cardiovascular: Regular rhythm.  Lungs: Normal work of breathing. Neurologic: No focal deficits.   Lab Results  Component Value Date   CREATININE 0.91 09/25/2019  BUN 18 09/25/2019   NA 143 09/25/2019   K 4.5 09/25/2019   CL 102 09/25/2019   CO2 23 09/25/2019   Lab Results  Component Value Date   ALT 12 09/25/2019   AST 17 09/25/2019   ALKPHOS 106  09/25/2019   BILITOT 0.3 09/25/2019   Lab Results  Component Value Date   HGBA1C 5.4 09/25/2019   Lab Results  Component Value Date   INSULIN 19.0 09/25/2019   Lab Results  Component Value Date   TSH 1.850 09/25/2019   Lab Results  Component Value Date   CHOL 239 (H) 09/25/2019   HDL 58 09/25/2019   LDLCALC 153 (H) 09/25/2019   TRIG 155 (H) 09/25/2019   CHOLHDL 4 03/07/2019   Lab Results  Component Value Date   WBC 5.4 09/25/2019   HGB 15.0 09/25/2019   HCT 45.4 09/25/2019   MCV 89 09/25/2019   PLT 206 09/25/2019   Attestation Statements:   Reviewed by clinician on day of visit: allergies, medications, problem list, medical history, surgical history, family history, social history, and previous encounter notes.  Time spent on visit including pre-visit chart review and post-visit care and charting was 30 minutes.   I, Water quality scientist, CMA, am acting as transcriptionist for Briscoe Deutscher, DO  I have reviewed the above documentation for accuracy and completeness, and I agree with the above. Briscoe Deutscher, DO

## 2019-12-15 ENCOUNTER — Ambulatory Visit (INDEPENDENT_AMBULATORY_CARE_PROVIDER_SITE_OTHER): Payer: No Typology Code available for payment source

## 2019-12-15 DIAGNOSIS — J309 Allergic rhinitis, unspecified: Secondary | ICD-10-CM

## 2019-12-22 ENCOUNTER — Ambulatory Visit (INDEPENDENT_AMBULATORY_CARE_PROVIDER_SITE_OTHER): Payer: No Typology Code available for payment source

## 2019-12-22 DIAGNOSIS — J309 Allergic rhinitis, unspecified: Secondary | ICD-10-CM

## 2019-12-25 DIAGNOSIS — J301 Allergic rhinitis due to pollen: Secondary | ICD-10-CM | POA: Diagnosis not present

## 2019-12-25 NOTE — Progress Notes (Signed)
VIALS EXP 12-24-20

## 2020-01-04 ENCOUNTER — Ambulatory Visit (INDEPENDENT_AMBULATORY_CARE_PROVIDER_SITE_OTHER): Payer: No Typology Code available for payment source | Admitting: *Deleted

## 2020-01-04 DIAGNOSIS — J309 Allergic rhinitis, unspecified: Secondary | ICD-10-CM | POA: Diagnosis not present

## 2020-01-11 ENCOUNTER — Other Ambulatory Visit: Payer: Self-pay

## 2020-01-11 ENCOUNTER — Encounter (INDEPENDENT_AMBULATORY_CARE_PROVIDER_SITE_OTHER): Payer: Self-pay | Admitting: Family Medicine

## 2020-01-11 ENCOUNTER — Ambulatory Visit (INDEPENDENT_AMBULATORY_CARE_PROVIDER_SITE_OTHER): Payer: No Typology Code available for payment source | Admitting: Family Medicine

## 2020-01-11 ENCOUNTER — Ambulatory Visit (INDEPENDENT_AMBULATORY_CARE_PROVIDER_SITE_OTHER): Payer: No Typology Code available for payment source

## 2020-01-11 VITALS — BP 124/75 | HR 59 | Temp 98.3°F | Ht 65.0 in | Wt 160.0 lb

## 2020-01-11 DIAGNOSIS — E7849 Other hyperlipidemia: Secondary | ICD-10-CM

## 2020-01-11 DIAGNOSIS — I10 Essential (primary) hypertension: Secondary | ICD-10-CM | POA: Diagnosis not present

## 2020-01-11 DIAGNOSIS — E782 Mixed hyperlipidemia: Secondary | ICD-10-CM | POA: Diagnosis not present

## 2020-01-11 DIAGNOSIS — R5383 Other fatigue: Secondary | ICD-10-CM

## 2020-01-11 DIAGNOSIS — E669 Obesity, unspecified: Secondary | ICD-10-CM

## 2020-01-11 DIAGNOSIS — Z9189 Other specified personal risk factors, not elsewhere classified: Secondary | ICD-10-CM

## 2020-01-11 DIAGNOSIS — Z6830 Body mass index (BMI) 30.0-30.9, adult: Secondary | ICD-10-CM

## 2020-01-11 DIAGNOSIS — R948 Abnormal results of function studies of other organs and systems: Secondary | ICD-10-CM | POA: Diagnosis not present

## 2020-01-11 DIAGNOSIS — E559 Vitamin D deficiency, unspecified: Secondary | ICD-10-CM

## 2020-01-11 DIAGNOSIS — J309 Allergic rhinitis, unspecified: Secondary | ICD-10-CM

## 2020-01-12 LAB — ANEMIA PANEL
Ferritin: 158 ng/mL — ABNORMAL HIGH (ref 15–150)
Folate, Hemolysate: 332 ng/mL
Folate, RBC: 765 ng/mL (ref >498)
Hematocrit: 43.4 % (ref 34.0–46.6)
Iron Saturation: 21 % (ref 15–55)
Iron: 55 ug/dL (ref 27–139)
Retic Ct Pct: 1.1 % (ref 0.6–2.6)
Total Iron Binding Capacity: 264 ug/dL (ref 250–450)
UIBC: 209 ug/dL (ref 118–369)
Vitamin B-12: 402 pg/mL (ref 232–1245)

## 2020-01-12 LAB — CBC WITH DIFFERENTIAL/PLATELET
Basophils Absolute: 0 10*3/uL (ref 0.0–0.2)
Basos: 1 %
EOS (ABSOLUTE): 0.1 10*3/uL (ref 0.0–0.4)
Eos: 2 %
Hemoglobin: 14.3 g/dL (ref 11.1–15.9)
Immature Grans (Abs): 0 10*3/uL (ref 0.0–0.1)
Immature Granulocytes: 0 %
Lymphocytes Absolute: 0.6 10*3/uL — ABNORMAL LOW (ref 0.7–3.1)
Lymphs: 12 %
MCH: 29.4 pg (ref 26.6–33.0)
MCHC: 32.9 g/dL (ref 31.5–35.7)
MCV: 89 fL (ref 79–97)
Monocytes Absolute: 0.4 10*3/uL (ref 0.1–0.9)
Monocytes: 8 %
Neutrophils Absolute: 3.6 10*3/uL (ref 1.4–7.0)
Neutrophils: 77 %
Platelets: 177 10*3/uL (ref 150–450)
RBC: 4.86 x10E6/uL (ref 3.77–5.28)
RDW: 13 % (ref 11.7–15.4)
WBC: 4.7 10*3/uL (ref 3.4–10.8)

## 2020-01-12 LAB — COMPREHENSIVE METABOLIC PANEL
ALT: 11 IU/L (ref 0–32)
AST: 12 IU/L (ref 0–40)
Albumin/Globulin Ratio: 1.9 (ref 1.2–2.2)
Albumin: 4.4 g/dL (ref 3.8–4.8)
Alkaline Phosphatase: 100 IU/L (ref 44–121)
BUN/Creatinine Ratio: 27 (ref 12–28)
BUN: 23 mg/dL (ref 8–27)
Bilirubin Total: 0.3 mg/dL (ref 0.0–1.2)
CO2: 23 mmol/L (ref 20–29)
Calcium: 9.8 mg/dL (ref 8.7–10.3)
Chloride: 103 mmol/L (ref 96–106)
Creatinine, Ser: 0.86 mg/dL (ref 0.57–1.00)
GFR calc Af Amer: 83 mL/min/{1.73_m2} (ref >59)
GFR calc non Af Amer: 72 mL/min/{1.73_m2} (ref >59)
Globulin, Total: 2.3 g/dL (ref 1.5–4.5)
Glucose: 86 mg/dL (ref 65–99)
Potassium: 4.3 mmol/L (ref 3.5–5.2)
Sodium: 141 mmol/L (ref 134–144)
Total Protein: 6.7 g/dL (ref 6.0–8.5)

## 2020-01-12 LAB — LIPID PANEL WITH LDL/HDL RATIO
Cholesterol, Total: 199 mg/dL (ref 100–199)
HDL: 51 mg/dL (ref >39)
LDL Chol Calc (NIH): 131 mg/dL — ABNORMAL HIGH (ref 0–99)
LDL/HDL Ratio: 2.6 ratio (ref 0.0–3.2)
Triglycerides: 95 mg/dL (ref 0–149)
VLDL Cholesterol Cal: 17 mg/dL (ref 5–40)

## 2020-01-12 LAB — VITAMIN D 25 HYDROXY (VIT D DEFICIENCY, FRACTURES): Vit D, 25-Hydroxy: 117 ng/mL — ABNORMAL HIGH (ref 30.0–100.0)

## 2020-01-12 LAB — TSH: TSH: 2.13 u[IU]/mL (ref 0.450–4.500)

## 2020-01-12 LAB — T4, FREE: Free T4: 1.17 ng/dL (ref 0.82–1.77)

## 2020-01-13 ENCOUNTER — Other Ambulatory Visit: Payer: Self-pay | Admitting: Family Medicine

## 2020-01-16 NOTE — Progress Notes (Signed)
Chief Complaint:   OBESITY Emily Wolfe is here to discuss her progress with her obesity treatment plan along with follow-up of her obesity related diagnoses.   Today's visit was #: 6 Starting weight: 187 lbs Starting date: 09/25/2019 Today's weight: 160 lbs Today's date: 01/11/2020 Total lbs lost to date: 27 lbs Body mass index is 26.63 kg/m.  Total weight loss percentage to date: -14.44%  Interim History: Emily Wolfe says she is tired today.  She has not been sleeping well and has been having early morning awakening.  She reports having increased stress. Nutrition Plan: practicing portion control and making smarter food choices, such as increasing vegetables and decreasing simple carbohydrates for 95% of the time.  Activity: Cardio for 30 minutes 6-7 times per week. Sleep:  She has not been sleeping well and has been waking early. Stress: Elevated.  Assessment/Plan:   1. Essential hypertension At goal. Medications: Cozaar 25 mg daily.   Plan: Avoid buying foods that are: processed, frozen, or prepackaged to avoid excess salt. We will continue to monitor symptoms as they relate to her weight loss journey.  BP Readings from Last 3 Encounters:  01/11/20 124/75  12/12/19 128/74  11/20/19 126/77   Lab Results  Component Value Date   CREATININE 0.86 01/11/2020   - Comprehensive metabolic panel - CBC with Differential/Platelet - T4, free - TSH  2. Abnormal metabolism Emily Wolfe's metabolism is lower than normal.  Plan:  Will check labs today.  - Anemia panel - VITAMIN D 25 Hydroxy (Vit-D Deficiency, Fractures)  3. Mixed hyperlipidemia Plan:  Will check labs today, as per below.  Dietary changes: Increase soluble fiber. Decrease simple carbohydrates. Exercise changes: An average 40 minutes of moderate to vigorous-intensity aerobic activity 3 or 4 times per week.   Lab Results  Component Value Date   CHOL 199 01/11/2020   HDL 51 01/11/2020   LDLCALC 131 (H) 01/11/2020    TRIG 95 01/11/2020   CHOLHDL 4 03/07/2019   Lab Results  Component Value Date   ALT 11 01/11/2020   AST 12 01/11/2020   ALKPHOS 100 01/11/2020   BILITOT 0.3 01/11/2020   The 10-year ASCVD risk score Mikey Bussing DC Jr., et al., 2013) is: 6.5%   Values used to calculate the score:     Age: 65 years     Sex: Female     Is Non-Hispanic African American: No     Diabetic: No     Tobacco smoker: No     Systolic Blood Pressure: 510 mmHg     Is BP treated: Yes     HDL Cholesterol: 51 mg/dL     Total Cholesterol: 199 mg/dL  - Lipid Panel With LDL/HDL Ratio  4. Vitamin D deficiency At goal. Current vitamin D is 78.8, tested on 09/25/2019. Optimal goal > 50 ng/dL.   Plan:  [x]   Continue Vitamin D @50 ,000 IU every week. []   Continue home supplement daily. [x]   Will check vitamin D level today.  - VITAMIN D 25 Hydroxy (Vit-D Deficiency, Fractures)  5. Other fatigue Will check labs today, as per below.  - Anemia panel  6. At risk for heart disease Emily Wolfe was given approximately 10 minutes of coronary artery disease prevention counseling today. She is 65 y.o. female and has risk factors for heart disease including obesity and hypertension. We discussed intensive lifestyle modifications today with an emphasis on specific weight loss instructions and strategies. Repetitive spaced learning was employed today to elicit superior memory formation and  behavioral change.  7. Class 1 obesity with serious comorbidity and body mass index (BMI) of 30.0 to 30.9 in adult, unspecified obesity type  Course: Emily Wolfe is currently in the action stage of change. As such, her goal is to continue with weight loss efforts.   Nutrition goals: She has agreed to practicing portion control and making smarter food choices, such as increasing vegetables and decreasing simple carbohydrates.   Exercise goals: For substantial health benefits, adults should do at least 150 minutes (2 hours and 30 minutes) a week of  moderate-intensity, or 75 minutes (1 hour and 15 minutes) a week of vigorous-intensity aerobic physical activity, or an equivalent combination of moderate- and vigorous-intensity aerobic activity. Aerobic activity should be performed in episodes of at least 10 minutes, and preferably, it should be spread throughout the week.  Behavioral modification strategies: increasing lean protein intake, decreasing simple carbohydrates, increasing vegetables, increasing water intake, decreasing liquid calories and decreasing alcohol intake.  Emily Wolfe has agreed to follow-up with our clinic in 4 weeks. She was informed of the importance of frequent follow-up visits to maximize her success with intensive lifestyle modifications for her multiple health conditions.   Emily Wolfe was informed we would discuss her lab results at her next visit unless there is a critical issue that needs to be addressed sooner. Emily Wolfe agreed to keep her next visit at the agreed upon time to discuss these results.  Objective:   Blood pressure 124/75, pulse (!) 59, temperature 98.3 F (36.8 C), temperature source Oral, height 5\' 5"  (1.651 m), weight 160 lb (72.6 kg), SpO2 98 %. Body mass index is 26.63 kg/m.  General: Cooperative, alert, well developed, in no acute distress. HEENT: Conjunctivae and lids unremarkable. Cardiovascular: Regular rhythm.  Lungs: Normal work of breathing. Neurologic: No focal deficits.   Lab Results  Component Value Date   CREATININE 0.86 01/11/2020   BUN 23 01/11/2020   NA 141 01/11/2020   K 4.3 01/11/2020   CL 103 01/11/2020   CO2 23 01/11/2020   Lab Results  Component Value Date   ALT 11 01/11/2020   AST 12 01/11/2020   ALKPHOS 100 01/11/2020   BILITOT 0.3 01/11/2020   Lab Results  Component Value Date   HGBA1C 5.4 09/25/2019   Lab Results  Component Value Date   INSULIN 19.0 09/25/2019   Lab Results  Component Value Date   TSH 2.130 01/11/2020   Lab Results  Component Value  Date   CHOL 199 01/11/2020   HDL 51 01/11/2020   LDLCALC 131 (H) 01/11/2020   TRIG 95 01/11/2020   CHOLHDL 4 03/07/2019   Lab Results  Component Value Date   WBC 4.7 01/11/2020   HGB 14.3 01/11/2020   HCT 43.4 01/11/2020   MCV 89 01/11/2020   PLT 177 01/11/2020   Lab Results  Component Value Date   IRON 55 01/11/2020   TIBC 264 01/11/2020   FERRITIN 158 (H) 01/11/2020   Attestation Statements:   Reviewed by clinician on day of visit: allergies, medications, problem list, medical history, surgical history, family history, social history, and previous encounter notes.  I, Water quality scientist, CMA, am acting as transcriptionist for Briscoe Deutscher, DO  I have reviewed the above documentation for accuracy and completeness, and I agree with the above. Briscoe Deutscher, DO

## 2020-01-19 ENCOUNTER — Ambulatory Visit (INDEPENDENT_AMBULATORY_CARE_PROVIDER_SITE_OTHER): Payer: No Typology Code available for payment source | Admitting: *Deleted

## 2020-01-19 DIAGNOSIS — J309 Allergic rhinitis, unspecified: Secondary | ICD-10-CM | POA: Diagnosis not present

## 2020-01-22 ENCOUNTER — Encounter (INDEPENDENT_AMBULATORY_CARE_PROVIDER_SITE_OTHER): Payer: Self-pay | Admitting: Family Medicine

## 2020-01-24 ENCOUNTER — Other Ambulatory Visit: Payer: Self-pay

## 2020-01-24 ENCOUNTER — Telehealth (INDEPENDENT_AMBULATORY_CARE_PROVIDER_SITE_OTHER): Payer: No Typology Code available for payment source | Admitting: Adult Health

## 2020-01-24 ENCOUNTER — Encounter (INDEPENDENT_AMBULATORY_CARE_PROVIDER_SITE_OTHER): Payer: Self-pay | Admitting: Adult Health

## 2020-01-24 DIAGNOSIS — E782 Mixed hyperlipidemia: Secondary | ICD-10-CM | POA: Diagnosis not present

## 2020-01-24 DIAGNOSIS — I10 Essential (primary) hypertension: Secondary | ICD-10-CM | POA: Diagnosis not present

## 2020-01-24 DIAGNOSIS — E669 Obesity, unspecified: Secondary | ICD-10-CM

## 2020-01-24 DIAGNOSIS — G35 Multiple sclerosis: Secondary | ICD-10-CM

## 2020-01-24 DIAGNOSIS — E559 Vitamin D deficiency, unspecified: Secondary | ICD-10-CM

## 2020-01-24 DIAGNOSIS — Z6830 Body mass index (BMI) 30.0-30.9, adult: Secondary | ICD-10-CM

## 2020-01-25 NOTE — Progress Notes (Signed)
TeleHealth Visit:  Due to the COVID-19 pandemic, this visit was completed with telemedicine (audio/video) technology to reduce patient and provider exposure as well as to preserve personal protective equipment.   Emily Wolfe has verbally consented to this TeleHealth visit. The patient is located at home, the provider is located at the [facility] office. The participants in this visit include the listed provider and patient. The visit was conducted today via video.   Chief Complaint: OBESITY Emily Wolfe is here to discuss her progress with her obesity treatment plan along with follow-up of her obesity related diagnoses. Emily Wolfe is on practicing portion control and making smarter food choices, such as increasing vegetables and decreasing simple carbohydrates and states she is following her eating plan approximately 90% of the time. Emily Wolfe states she is doing cardio 30 minutes 7 times per week.  Today's visit was #: 7 Starting weight: 187 lbs Starting date: 09/25/2019  Interim History: Emily Wolfe recently traveled to [place] to visit her neurologist. She has biweekly visits. She ate off plan at several dinners while in [place]. Her current weight at home is 158 lbs and her home scale normally weighs 2 lbs heavier than clinic scale. Her ultimate goal is to lose down to 145 lbs.  Subjective:   1. Essential hypertension Discussed labs with patient today. [name] is on Losartan 25 mg. She denies cardiac symptoms.  BP Readings from Last 3 Encounters:  01/11/20 124/75  12/12/19 128/74  11/20/19 126/77    2. Mixed hyperlipidemia Discussed labs with patient today. 01/11/2020 Lipid panel resulted total, triglycerides, and LDL are all improved. LDL is still above goal at 131. We discussed risks and benefits of statin therapy. Patient prefers to continue with lifestyle modifications.   Lab Results  Component Value Date   ALT 11 01/11/2020   AST 12 01/11/2020   ALKPHOS 100  01/11/2020   BILITOT 0.3 01/11/2020   Lab Results  Component Value Date   CHOL 199 01/11/2020   HDL 51 01/11/2020   LDLCALC 131 (H) 01/11/2020   TRIG 95 01/11/2020   CHOLHDL 4 03/07/2019    3. MS (multiple sclerosis) (HCC) Dawna is followed every 6 months [facility]'s Dr. Colette Ribas. She has been with the practice since 1993. She was started on B12 and duloxetine was increased from 30 mg to 60 mg daily in an effort to treat paresthesia.  4. Vitamin D deficiency  Discussed labs with patient today. Emily Wolfe's Vitamin D level was 117 on 01/11/2020. She is currently taking prescription vitamin D 50,000 IU each week. She denies nausea, vomiting or muscle weakness.  Ref. Range 01/11/2020 16:08  Vitamin D, 25-Hydroxy Latest Ref Range: 30.0 - 100.0 ng/mL 117.0 (H)   Assessment/Plan:   1. Essential hypertension [name] is working on healthy weight loss and exercise to improve blood pressure control. We will watch for signs of hypotension as she continues her lifestyle modifications. Continue Losartan and PC/[place].  2. Mixed hyperlipidemia Cardiovascular risk and specific lipid/LDL goals reviewed.  We discussed several lifestyle modifications today and Satin will continue to work on diet, exercise and weight loss efforts. Orders and follow up as documented in patient record. Continue PC/[place] and regular exercise.  Counseling Intensive lifestyle modifications are the first line treatment for this issue. . Dietary changes: Increase soluble fiber. Decrease simple carbohydrates. . Exercise changes: Moderate to vigorous-intensity aerobic activity 150 minutes per week if tolerated. . Lipid-lowering medications: see documented in medical record.  3. MS (multiple sclerosis) ([place]) Continue with  Neurologist as directed.  4. Vitamin D deficiency Recommend STOPPING Vit D supplement. Await recommendation from neurologist about Vit D.   5. Class 1 obesity with serious comorbidity and body  mass index (BMI) of 30.0 to 30.9 in adult, unspecified obesity type Emily Wolfe is currently in the action stage of change. As such, her goal is to continue with weight loss efforts. She has agreed to practicing portion control and making smarter food choices, such as increasing vegetables and decreasing simple carbohydrates.   Exercise goals: As is  Behavioral modification strategies: increasing lean protein intake, meal planning and cooking strategies, better snacking choices and planning for success.  Emily Wolfe has agreed to follow-up with our clinic in 2 weeks. She was informed of the importance of frequent follow-up visits to maximize her success with intensive lifestyle modifications for her multiple health conditions.  Objective:   VITALS: Per patient if applicable, see vitals. GENERAL: Alert and in no acute distress. CARDIOPULMONARY: No increased WOB. Speaking in clear sentences.  PSYCH: Pleasant and cooperative. Speech normal rate and rhythm. Affect is appropriate. Insight and judgement are appropriate. Attention is focused, linear, and appropriate.  NEURO: Oriented as arrived to appointment on time with no prompting.   Lab Results  Component Value Date   CREATININE 0.86 01/11/2020   BUN 23 01/11/2020   NA 141 01/11/2020   K 4.3 01/11/2020   CL 103 01/11/2020   CO2 23 01/11/2020   Lab Results  Component Value Date   ALT 11 01/11/2020   AST 12 01/11/2020   ALKPHOS 100 01/11/2020   BILITOT 0.3 01/11/2020   Lab Results  Component Value Date   HGBA1C 5.4 09/25/2019   Lab Results  Component Value Date   INSULIN 19.0 09/25/2019   Lab Results  Component Value Date   TSH 2.130 01/11/2020   Lab Results  Component Value Date   CHOL 199 01/11/2020   HDL 51 01/11/2020   LDLCALC 131 (H) 01/11/2020   TRIG 95 01/11/2020   CHOLHDL 4 03/07/2019   Lab Results  Component Value Date   WBC 4.7 01/11/2020   HGB 14.3 01/11/2020   HCT 43.4 01/11/2020   MCV 89 01/11/2020   PLT  177 01/11/2020   Lab Results  Component Value Date   IRON 55 01/11/2020   TIBC 264 01/11/2020   FERRITIN 158 (H) 01/11/2020    Attestation Statements:   Reviewed by clinician on day of visit: allergies, medications, problem list, medical history, surgical history, family history, social history, and previous encounter notes.  Time spent on visit including pre-visit chart review and post-visit charting and care was 32 minutes.   Coral Ceo, am acting as Location manager for Mina Marble, NP.  I have reviewed the above documentation for accuracy and completeness, and I agree with the above. - Laveda Demedeiros d. Kimmi Acocella, NP-C

## 2020-01-26 DIAGNOSIS — Z6830 Body mass index (BMI) 30.0-30.9, adult: Secondary | ICD-10-CM | POA: Insufficient documentation

## 2020-01-30 ENCOUNTER — Ambulatory Visit (INDEPENDENT_AMBULATORY_CARE_PROVIDER_SITE_OTHER): Payer: No Typology Code available for payment source | Admitting: Allergy & Immunology

## 2020-01-30 ENCOUNTER — Other Ambulatory Visit: Payer: Self-pay

## 2020-01-30 ENCOUNTER — Encounter: Payer: Self-pay | Admitting: Allergy & Immunology

## 2020-01-30 DIAGNOSIS — J302 Other seasonal allergic rhinitis: Secondary | ICD-10-CM

## 2020-01-30 DIAGNOSIS — J309 Allergic rhinitis, unspecified: Secondary | ICD-10-CM

## 2020-01-30 MED ORDER — EPINEPHRINE 0.3 MG/0.3ML IJ SOAJ: 0.3000 mg | INTRAMUSCULAR | 2 refills | Status: DC | PRN

## 2020-01-30 MED ORDER — IPRATROPIUM BROMIDE 0.06 % NA SOLN: 2.0000 | Freq: Four times a day (QID) | NASAL | 5 refills | Status: DC

## 2020-01-30 NOTE — Progress Notes (Signed)
RE: Emily Wolfe MRN: [id number] DOB: January 13, 1955 Date of Telemedicine Visit: 01/30/2020  Referring provider: Vivi Barrack, MD Primary care provider: Vivi Barrack, MD  Chief Complaint: Sinus Problem (6 month nasal drip )   Telemedicine Follow Up Visit via Telephone: I connected with Emily Wolfe for a follow up on 01/30/20 by telephone and verified that I am speaking with the correct person using two identifiers.   I discussed the limitations, risks, security and privacy concerns of performing an evaluation and management service by telephone and the availability of in person appointments. I also discussed with the patient that there may be a patient responsible charge related to this service. The patient expressed understanding and agreed to proceed.  Patient is at home.  Provider is at the office.  Visit start time: 10:26 AM Visit end time: 10:51 AM Insurance consent/check in by: [facility] Medical consent and medical assistant/nurse: Logan  History of Present Illness:  She is a 65 y.o. female, who is being followed for perennial and seasonal allergic rhinitis as well as allergic rhinoconjunctivitis. Her previous allergy office visit was in August 2020 with myself.  At that visit, she was not experiencing as much improvement with her shots as we felt was indicated.  We recommended retesting to see if there is something new in her shots we need to cover.  We did increased her pollen in the next vial. We changed her to weekly injections in perpetuity since she seemed to feel better on that.   In the interim, she has done fairly well.  It is always difficult to read her mood, but she tells me that she is much better on shots than she was before.  She is doing weekly shots, typically on Thursdays.  Her symptoms are overall controlled fairly well.  She remains on her nose sprays, Flonase and Astelin, at least once daily.  She does report 6 months of worsening sinus issues, including  copious rhinorrhea.  She did go see otolaryngology (Dr. Blenda Nicely) in 2019 or so, but she was not depressed. She is quite interested in another referral, but she is open to it if needed. She does know that when she misses a shot, she does have ENT symptoms that are more noticeable.   At this point in time, she reports that she does have some intermittent ear fullness on the left side more than the right side. She can have movement when her jaw goes up and down. She reports that this is when she notices it more. She tries to just block things when she had issues.   She is open to changing his medications around.  She does not remember the last time she had a sinus CT, evidently she did see ENT for several years before she moved here from the [place] area.  Her MS is stable.  She follows with a physician up in [place] for this.  She is currently on leflunomide as well as duloxetine.  She is fully vaccinated against COVID-19, including a booster.  Otherwise, there have been no changes to her past medical history, surgical history, family history, or social history.  Assessment and Plan:  Emily Wolfe is a 66 y.o. female with:  Seasonal and perennial allergic rhinitis(ragweed, grasses, indoor molds and outdoor molds)  Allergic rhinoconjunctivitis  Fully vaccinated against COVID19, including booster   Complicated past medical history, including multiple sclerosis   We are going to continue with allergy shots at the same schedule for now.  She prefers weekly injections.  Her main complaint today is rhinorrhea, swearing to try nasal ipratropium to see if this provides any relief.  We did send in a supply of this.  I did emphasize that she needed to get updates to the allergy shot nurse so that those can get back to me.  We can certainly consider another otolaryngology referral in the future if needed.  Diagnostics: None.  Medication List:  Current Outpatient Medications  Medication  Sig Dispense Refill  . azelastine (ASTELIN) 0.1 % nasal spray PLACE 2 SPRAYS INTO BOTH NOSTRILS 2 TIMES DAILY. 30 mL 0  . Cholecalciferol (VITAMIN D3) 1.25 MG (50000 UT) CAPS Take 50,000 Units by mouth once a week. On Sunday  3  . Cyanocobalamin (B-12) 2500 MCG SUBL Place 1 tablet under the tongue daily.    . diclofenac sodium (VOLTAREN) 1 % GEL Apply 2 g topically 4 (four) times daily as needed (knee pain).     . DULoxetine (CYMBALTA) 60 MG capsule Take 60 mg by mouth daily.    Marland Kitchen EPINEPHrine (AUVI-Q) 0.3 mg/0.3 mL IJ SOAJ injection Inject 0.3 mg into the muscle as needed for anaphylaxis. 2 each 2  . ipratropium (ATROVENT) 0.06 % nasal spray Place 2 sprays into both nostrils 4 (four) times daily. 15 mL 5  . leflunomide (ARAVA) 10 MG tablet Take 10 mg by mouth daily.    Marland Kitchen loratadine (CLARITIN) 10 MG tablet Take 10 mg by mouth daily.    Marland Kitchen losartan (COZAAR) 25 MG tablet TAKE 1 TABLET BY MOUTH EVERY DAY 90 tablet 3  . Magnesium-Zinc (MAGNESIUM-CHELATED ZINC PO) Take 1 tablet by mouth daily.    . vitamin B-12 (CYANOCOBALAMIN) 250 MCG tablet Take 250 mcg by mouth daily.    . prazosin (MINIPRESS) 1 MG capsule Take 1 mg by mouth at bedtime. (Patient not taking: Reported on 01/30/2020)     No current facility-administered medications for this visit.   Allergies: No Known Allergies I reviewed her past medical history, social history, family history, and environmental history and no significant changes have been reported from previous visits.  Review of Systems  Constitutional: Negative for activity change and appetite change.  HENT: Positive for congestion, postnasal drip and sinus pressure. Negative for rhinorrhea and sore throat.        Positive for jaw pain.  Eyes: Positive for itching. Negative for pain, discharge and redness.  Respiratory: Negative for shortness of breath, wheezing and stridor.   Gastrointestinal: Negative for diarrhea, nausea and vomiting.  Endocrine: Negative for cold  intolerance and heat intolerance.  Musculoskeletal: Negative for arthralgias, joint swelling and myalgias.  Skin: Negative for rash.  Allergic/Immunologic: Negative for environmental allergies and food allergies.    Objective:  Physical exam not obtained as encounter was done via telephone.   Previous notes and tests were reviewed.  I discussed the assessment and treatment plan with the patient. The patient was provided an opportunity to ask questions and all were answered. The patient agreed with the plan and demonstrated an understanding of the instructions.   The patient was advised to call back or seek an in-person evaluation if the symptoms worsen or if the condition fails to improve as anticipated.  I provided 25 minutes of non-face-to-face time during this encounter.  It was my pleasure to participate in [place] care today. Please feel free to contact me with any questions or concerns.   Sincerely,  Valentina Shaggy, MD

## 2020-02-01 ENCOUNTER — Ambulatory Visit (INDEPENDENT_AMBULATORY_CARE_PROVIDER_SITE_OTHER): Payer: No Typology Code available for payment source | Admitting: *Deleted

## 2020-02-01 DIAGNOSIS — J309 Allergic rhinitis, unspecified: Secondary | ICD-10-CM

## 2020-02-08 ENCOUNTER — Encounter (INDEPENDENT_AMBULATORY_CARE_PROVIDER_SITE_OTHER): Payer: Self-pay | Admitting: Family Medicine

## 2020-02-08 ENCOUNTER — Ambulatory Visit (INDEPENDENT_AMBULATORY_CARE_PROVIDER_SITE_OTHER): Payer: No Typology Code available for payment source

## 2020-02-08 ENCOUNTER — Ambulatory Visit (INDEPENDENT_AMBULATORY_CARE_PROVIDER_SITE_OTHER): Payer: No Typology Code available for payment source | Admitting: Family Medicine

## 2020-02-08 ENCOUNTER — Other Ambulatory Visit: Payer: Self-pay

## 2020-02-08 VITALS — BP 112/74 | HR 66 | Temp 97.7°F | Ht 65.0 in | Wt 154.0 lb

## 2020-02-08 DIAGNOSIS — J309 Allergic rhinitis, unspecified: Secondary | ICD-10-CM

## 2020-02-08 DIAGNOSIS — Z9189 Other specified personal risk factors, not elsewhere classified: Secondary | ICD-10-CM

## 2020-02-08 DIAGNOSIS — R948 Abnormal results of function studies of other organs and systems: Secondary | ICD-10-CM

## 2020-02-08 DIAGNOSIS — E669 Obesity, unspecified: Secondary | ICD-10-CM

## 2020-02-08 DIAGNOSIS — G35 Multiple sclerosis: Secondary | ICD-10-CM | POA: Diagnosis not present

## 2020-02-08 DIAGNOSIS — E673 Hypervitaminosis D: Secondary | ICD-10-CM

## 2020-02-08 DIAGNOSIS — Z6830 Body mass index (BMI) 30.0-30.9, adult: Secondary | ICD-10-CM

## 2020-02-08 MED ORDER — PHENTERMINE HCL 8 MG PO TABS
1.0000 | ORAL_TABLET | Freq: Every day | ORAL | 0 refills | Status: DC
Start: 2020-02-08 — End: 2020-03-12

## 2020-02-12 ENCOUNTER — Encounter (INDEPENDENT_AMBULATORY_CARE_PROVIDER_SITE_OTHER): Payer: Self-pay | Admitting: Family Medicine

## 2020-02-13 NOTE — Progress Notes (Signed)
Chief Complaint:   OBESITY Emily Wolfe is here to discuss her progress with her obesity treatment plan along with follow-up of her obesity related diagnoses.   Today's visit was #: 8 Starting weight: 187 lbs Starting date: 09/25/2019 Today's weight: 154 lbs Today's date: 02/08/2020 Total lbs lost to date: 33 lbs Body mass index is 25.63 kg/m.  Total weight loss percentage to date: -17.65%  Interim History: Emily Wolfe reduced her vitamin D dose to every 14 days, with plans to recheck in 3 months.  She endorses constipation.  She has doubled her Cymbalta to 60 mg daily for the past 3 weeks and says it is helping her pain. Nutrition Plan: practicing portion control and making smarter food choices, such as increasing vegetables and decreasing simple carbohydrates for 90-95% of the time. Activity: Cardio for 30 minutes 7 times per week.  Assessment/Plan:   1. Abnormal metabolism - low Emily Wolfe endorses polyphagia.  At the same time, increase calories toward maintenance.    Plan:  Will start phentermine, as per below.  - Start Phentermine HCl 8 MG TABS; Take 1 tablet by mouth daily.  Dispense: 28 tablet; Refill: 0  We reviewed potential side effects including insomnia, dry mouth, increased heart rate and blood pressure, and increased anxiety. We reviewed reducing caffeine consumption while taking phentermine, especially if the patient is experiencing side effects. Alternative treatment options were discussed. All questions were answered, and the patient wishes to move forward with this medication.  I have consulted the [place] Controlled Substances Registry for this patient, and feel the risk/benefit ratio today is favorable for proceeding with this prescription for a controlled substance. The patient understands monitoring parameters and red flags.   2. MS (multiple sclerosis) ([place]) I have reviewed her recent visit with Neurology. We will continue to monitor symptoms as they relate to her  weight loss journey.  3. Hypervitaminosis D Her vitamin D dose was decreased to every 14 days by Neurology.    Plan:  We will continue to monitor.  4. At risk for heart disease Due to Emily Wolfe's current state of health and medical condition(s), she is at a higher risk for heart disease.  This puts the patient at much greater risk to subsequently develop cardiopulmonary conditions that can significantly affect patient's quality of life in a negative manner.    At least 8 minutes were spent on counseling Emily Wolfe about these concerns today. Counseling:  Intensive lifestyle modifications were discussed with Emily Wolfe as the most appropriate first line of treatment.  she will continue to work on diet, exercise, and weight loss efforts.  We will continue to reassess these conditions on a fairly regular basis in an attempt to decrease the patient's overall morbidity and mortality.  5. Class 1 obesity with serious comorbidity and body mass index (BMI) of 30.0 to 30.9 in adult, unspecified obesity type  Course: Marnae is currently in the action stage of change. As such, her goal is to continue with weight loss efforts.   Nutrition goals: She has agreed to practicing portion control and making smarter food choices, such as increasing vegetables and decreasing simple carbohydrates.   Exercise goals: As is.  Behavioral modification strategies: increasing lean protein intake and increasing vegetables.  Emily Wolfe has agreed to follow-up with our clinic in 3 weeks. She was informed of the importance of frequent follow-up visits to maximize her success with intensive lifestyle modifications for her multiple health conditions.   Objective:   Blood pressure 112/74, pulse 66, temperature  97.7 F (36.5 C), temperature source Oral, height 5\' 5"  (1.651 m), weight 154 lb (69.9 kg), SpO2 97 %. Body mass index is 25.63 kg/m.  General: Cooperative, alert, well developed, in no acute distress. HEENT:  Conjunctivae and lids unremarkable. Cardiovascular: Regular rhythm.  Lungs: Normal work of breathing. Neurologic: No focal deficits.   Lab Results  Component Value Date   CREATININE 0.86 01/11/2020   BUN 23 01/11/2020   NA 141 01/11/2020   K 4.3 01/11/2020   CL 103 01/11/2020   CO2 23 01/11/2020   Lab Results  Component Value Date   ALT 11 01/11/2020   AST 12 01/11/2020   ALKPHOS 100 01/11/2020   BILITOT 0.3 01/11/2020   Lab Results  Component Value Date   HGBA1C 5.4 09/25/2019   Lab Results  Component Value Date   INSULIN 19.0 09/25/2019   Lab Results  Component Value Date   TSH 2.130 01/11/2020   Lab Results  Component Value Date   CHOL 199 01/11/2020   HDL 51 01/11/2020   LDLCALC 131 (H) 01/11/2020   TRIG 95 01/11/2020   CHOLHDL 4 03/07/2019   Lab Results  Component Value Date   WBC 4.7 01/11/2020   HGB 14.3 01/11/2020   HCT 43.4 01/11/2020   MCV 89 01/11/2020   PLT 177 01/11/2020   Lab Results  Component Value Date   IRON 55 01/11/2020   TIBC 264 01/11/2020   FERRITIN 158 (H) 01/11/2020   Attestation Statements:   Reviewed by clinician on day of visit: allergies, medications, problem list, medical history, surgical history, family history, social history, and previous encounter notes.  I, Water quality scientist, CMA, am acting as transcriptionist for Briscoe Deutscher, DO  I have reviewed the above documentation for accuracy and completeness, and I agree with the above. Briscoe Deutscher, DO

## 2020-02-15 ENCOUNTER — Ambulatory Visit (INDEPENDENT_AMBULATORY_CARE_PROVIDER_SITE_OTHER): Payer: No Typology Code available for payment source

## 2020-02-15 DIAGNOSIS — J309 Allergic rhinitis, unspecified: Secondary | ICD-10-CM

## 2020-02-22 ENCOUNTER — Ambulatory Visit (INDEPENDENT_AMBULATORY_CARE_PROVIDER_SITE_OTHER): Payer: No Typology Code available for payment source | Admitting: Family Medicine

## 2020-02-22 ENCOUNTER — Ambulatory Visit (INDEPENDENT_AMBULATORY_CARE_PROVIDER_SITE_OTHER): Payer: No Typology Code available for payment source | Admitting: *Deleted

## 2020-02-22 ENCOUNTER — Encounter (INDEPENDENT_AMBULATORY_CARE_PROVIDER_SITE_OTHER): Payer: Self-pay | Admitting: Family Medicine

## 2020-02-22 ENCOUNTER — Other Ambulatory Visit: Payer: Self-pay

## 2020-02-22 VITALS — BP 131/78 | HR 67 | Temp 97.9°F | Ht 65.0 in | Wt 153.0 lb

## 2020-02-22 DIAGNOSIS — Z6830 Body mass index (BMI) 30.0-30.9, adult: Secondary | ICD-10-CM | POA: Diagnosis not present

## 2020-02-22 DIAGNOSIS — E669 Obesity, unspecified: Secondary | ICD-10-CM

## 2020-02-22 DIAGNOSIS — J309 Allergic rhinitis, unspecified: Secondary | ICD-10-CM | POA: Diagnosis not present

## 2020-02-22 DIAGNOSIS — R948 Abnormal results of function studies of other organs and systems: Secondary | ICD-10-CM | POA: Diagnosis not present

## 2020-02-26 NOTE — Progress Notes (Signed)
Chief Complaint:   OBESITY Emily Wolfe is here to discuss her progress with her obesity treatment plan along with follow-up of her obesity related diagnoses. Emily Wolfe is on practicing portion control and making smarter food choices, such as increasing vegetables and decreasing simple carbohydrates and states she is following her eating plan approximately 95% of the time. Emily Wolfe states she is doing cardio 45 minutes 7 times per week.  Today's visit was #: 9 Starting weight: 187 lbs Starting date: 09/25/2019 Today's weight: 153 lbs Today's date: 02/22/2020 Total lbs lost to date: 34 lbs Total lbs lost since last in-office visit: 1 lb  Interim History: Emily Wolfe is very frustrated with lack of weight loss. However, she is down 1 lb today. Her weight goal is 145 lbs (BMI 24). MS and previous abdominal surgeries prevent resistance training. She sticks to the plan nearly perfectly. She did try cutting calories last week, which let to her 1 lb loss.   Subjective:   1. Abnormal metabolism - low Emily Wolfe notes severe fatigue. She was prescribed phentermine 8 mg daily at last OV. She has not noticed any difference in energy level or appetite. However, she denies polyphagia. Her TSH and hemoglobin are within normal limits.  Assessment/Plan:   1. Abnormal metabolism - low Continue meal plan, eat prescribed amount of protein, and do not skip meals.  2. Class 1 obesity with serious comorbidity and body mass index (BMI) of 30.0 to 30.9 in adult, unspecified obesity type Emily Wolfe is currently in the action stage of change. As such, her goal is to continue with weight loss efforts. She has agreed to the Category 1 Plan or practicing portion control and making smarter food choices, such as increasing vegetables and decreasing simple carbohydrates.   Encouragement provided. Advised pt that weight loss will be slower as she approaches her goal. I also advised that a higher weight would be perfectly  acceptable. She is a BMI of 25 today.    Exercise goals: As is  Behavioral modification strategies: no skipping meals and planning for success.  Emily Wolfe has agreed to follow-up with our clinic in 2 weeks.   Objective:   Blood pressure 131/78, pulse 67, temperature 97.9 F (36.6 C), height 5\' 5"  (1.651 m), weight 153 lb (69.4 kg), SpO2 98 %. Body mass index is 25.46 kg/m.  General: Cooperative, alert, well developed, in no acute distress. HEENT: Conjunctivae and lids unremarkable. Cardiovascular: Regular rhythm.  Lungs: Normal work of breathing. Neurologic: No focal deficits.   Lab Results  Component Value Date   CREATININE 0.86 01/11/2020   BUN 23 01/11/2020   NA 141 01/11/2020   K 4.3 01/11/2020   CL 103 01/11/2020   CO2 23 01/11/2020   Lab Results  Component Value Date   ALT 11 01/11/2020   AST 12 01/11/2020   ALKPHOS 100 01/11/2020   BILITOT 0.3 01/11/2020   Lab Results  Component Value Date   HGBA1C 5.4 09/25/2019   Lab Results  Component Value Date   INSULIN 19.0 09/25/2019   Lab Results  Component Value Date   TSH 2.130 01/11/2020   Lab Results  Component Value Date   CHOL 199 01/11/2020   HDL 51 01/11/2020   LDLCALC 131 (H) 01/11/2020   TRIG 95 01/11/2020   CHOLHDL 4 03/07/2019   Lab Results  Component Value Date   WBC 4.7 01/11/2020   HGB 14.3 01/11/2020   HCT 43.4 01/11/2020   MCV 89 01/11/2020   PLT 177 01/11/2020  Lab Results  Component Value Date   IRON 55 01/11/2020   TIBC 264 01/11/2020   FERRITIN 158 (H) 01/11/2020    Attestation Statements:   Reviewed by clinician on day of visit: allergies, medications, problem list, medical history, surgical history, family history, social history, and previous encounter notes.  Coral Ceo, am acting as Location manager for [employer], [place].  I have reviewed the above documentation for accuracy and completeness, and I agree with the above. -  Georgianne Fick, FNP

## 2020-02-27 ENCOUNTER — Encounter (INDEPENDENT_AMBULATORY_CARE_PROVIDER_SITE_OTHER): Payer: Self-pay | Admitting: Family Medicine

## 2020-03-07 ENCOUNTER — Ambulatory Visit (INDEPENDENT_AMBULATORY_CARE_PROVIDER_SITE_OTHER): Payer: No Typology Code available for payment source

## 2020-03-07 DIAGNOSIS — J309 Allergic rhinitis, unspecified: Secondary | ICD-10-CM | POA: Diagnosis not present

## 2020-03-12 ENCOUNTER — Ambulatory Visit (INDEPENDENT_AMBULATORY_CARE_PROVIDER_SITE_OTHER): Payer: No Typology Code available for payment source | Admitting: Family Medicine

## 2020-03-12 ENCOUNTER — Other Ambulatory Visit: Payer: Self-pay

## 2020-03-12 ENCOUNTER — Encounter (INDEPENDENT_AMBULATORY_CARE_PROVIDER_SITE_OTHER): Payer: Self-pay | Admitting: Family Medicine

## 2020-03-12 VITALS — BP 122/68 | HR 69 | Temp 98.2°F | Ht 65.0 in | Wt 148.0 lb

## 2020-03-12 DIAGNOSIS — G8929 Other chronic pain: Secondary | ICD-10-CM | POA: Diagnosis not present

## 2020-03-12 DIAGNOSIS — Z9189 Other specified personal risk factors, not elsewhere classified: Secondary | ICD-10-CM | POA: Diagnosis not present

## 2020-03-12 DIAGNOSIS — I10 Essential (primary) hypertension: Secondary | ICD-10-CM | POA: Diagnosis not present

## 2020-03-12 DIAGNOSIS — E669 Obesity, unspecified: Secondary | ICD-10-CM | POA: Diagnosis not present

## 2020-03-12 DIAGNOSIS — G35 Multiple sclerosis: Secondary | ICD-10-CM

## 2020-03-12 DIAGNOSIS — Z6830 Body mass index (BMI) 30.0-30.9, adult: Secondary | ICD-10-CM

## 2020-03-13 NOTE — Progress Notes (Signed)
Chief Complaint:   OBESITY Emily Wolfe is here to discuss her progress with her obesity treatment plan along with follow-up of her obesity related diagnoses.   Today's visit was #: 10 Starting weight: 187 lbs Starting date: 09/25/2019 Today's weight: 148 lbs Today's date: 03/12/2020 Total lbs lost to date: 39 lbs Body mass index is 24.63 kg/m.  Total weight loss percentage to date: -20.86%  Interim History:  Emily Wolfe says that for lunch she has a protein shake and for dinner, a sandwich.  She has increased her exercise.  She says her goal weight is 140-145 pounds for flexibility of lifestyle.  Current Meal Plan: the Category 1 Plan or practicing portion control and making smarter food choices, such as increasing vegetables and decreasing simple carbohydrates for 95% of the time.  Current Exercise Plan: Aerobics/cardio for 45 minutes 6-7 times per week.  Assessment/Plan:   1. MS (multiple sclerosis) ([place]) I have reviewed her recent visit with Neurology. We will continue to monitor symptoms as they relate to her weight loss journey.  2. Essential hypertension At goal. Medications: losartan 25 mg daily.   Plan: Avoid buying foods that are: processed, frozen, or prepackaged to avoid excess salt. We will continue to monitor closely alongside her PCP and/or Specialist.  Regular follow up with PCP and specialists was also encouraged.   BP Readings from Last 3 Encounters:  03/12/20 122/68  02/22/20 131/78  02/08/20 112/74   Lab Results  Component Value Date   CREATININE 0.86 01/11/2020   3. Other chronic pain Will follow because mobility and pain control are important for weight management.   4. At high risk for malnutrition Emily Wolfe was given extensive malnutrition prevention education and counseling today of more than 10 minutes.  Counseled her that malnutrition refers to inappropriate nutrients or not the right balance of nutrients for optimal health.  Discussed with Emily Wolfe that it is absolutely possible to be malnourished but yet obese.  Risk factors, including but not limited to, inappropriate dietary choices, difficulty with obtaining food due to physical or financial limitations, and various physical and mental health conditions were reviewed with Emily Wolfe.   5. Class 1 obesity with serious comorbidity and body mass index (BMI) of 30.0 to 30.9 in adult, unspecified obesity type  Course: Emily Wolfe is currently in the action stage of change. As such, her goal is to continue with weight loss efforts.   Nutrition goals: She has agreed to keeping a food journal and adhering to recommended goals of 1000-1100 calories.   Exercise goals: As is.  Behavioral modification strategies: increasing lean protein intake, decreasing simple carbohydrates, increasing vegetables and increasing water intake.  Emily Wolfe has agreed to follow-up with our clinic in 4 weeks. She was informed of the importance of frequent follow-up visits to maximize her success with intensive lifestyle modifications for her multiple health conditions.   Objective:   Blood pressure 122/68, pulse 69, temperature 98.2 F (36.8 C), temperature source Oral, height 5\' 5"  (1.651 m), weight 148 lb (67.1 kg), SpO2 97 %. Body mass index is 24.63 kg/m.  General: Cooperative, alert, well developed, in no acute distress. HEENT: Conjunctivae and lids unremarkable. Cardiovascular: Regular rhythm.  Lungs: Normal work of breathing. Neurologic: No focal deficits.   Lab Results  Component Value Date   CREATININE 0.86 01/11/2020   BUN 23 01/11/2020   NA 141 01/11/2020   K 4.3 01/11/2020   CL 103 01/11/2020   CO2 23 01/11/2020   Lab Results  Component Value Date   ALT 11 01/11/2020   AST 12 01/11/2020   ALKPHOS 100 01/11/2020   BILITOT 0.3 01/11/2020   Lab Results  Component Value Date   HGBA1C 5.4 09/25/2019   Lab Results  Component Value Date   INSULIN 19.0 09/25/2019   Lab Results   Component Value Date   TSH 2.130 01/11/2020   Lab Results  Component Value Date   CHOL 199 01/11/2020   HDL 51 01/11/2020   LDLCALC 131 (H) 01/11/2020   TRIG 95 01/11/2020   CHOLHDL 4 03/07/2019   Lab Results  Component Value Date   WBC 4.7 01/11/2020   HGB 14.3 01/11/2020   HCT 43.4 01/11/2020   MCV 89 01/11/2020   PLT 177 01/11/2020   Lab Results  Component Value Date   IRON 55 01/11/2020   TIBC 264 01/11/2020   FERRITIN 158 (H) 01/11/2020   Attestation Statements:   Reviewed by clinician on day of visit: allergies, medications, problem list, medical history, surgical history, family history, social history, and previous encounter notes.  I, Water quality scientist, CMA, am acting as transcriptionist for Briscoe Deutscher, DO  I have reviewed the above documentation for accuracy and completeness, and I agree with the above. Briscoe Deutscher, DO

## 2020-03-14 ENCOUNTER — Ambulatory Visit (INDEPENDENT_AMBULATORY_CARE_PROVIDER_SITE_OTHER): Payer: No Typology Code available for payment source

## 2020-03-14 DIAGNOSIS — J309 Allergic rhinitis, unspecified: Secondary | ICD-10-CM

## 2020-03-18 ENCOUNTER — Other Ambulatory Visit: Payer: Self-pay | Admitting: Family Medicine

## 2020-03-21 ENCOUNTER — Ambulatory Visit (INDEPENDENT_AMBULATORY_CARE_PROVIDER_SITE_OTHER): Payer: No Typology Code available for payment source | Admitting: *Deleted

## 2020-03-21 DIAGNOSIS — J309 Allergic rhinitis, unspecified: Secondary | ICD-10-CM

## 2020-03-26 ENCOUNTER — Ambulatory Visit (INDEPENDENT_AMBULATORY_CARE_PROVIDER_SITE_OTHER): Payer: No Typology Code available for payment source | Admitting: Family Medicine

## 2020-03-28 ENCOUNTER — Ambulatory Visit (INDEPENDENT_AMBULATORY_CARE_PROVIDER_SITE_OTHER): Payer: No Typology Code available for payment source | Admitting: *Deleted

## 2020-03-28 DIAGNOSIS — J309 Allergic rhinitis, unspecified: Secondary | ICD-10-CM | POA: Diagnosis not present

## 2020-04-11 ENCOUNTER — Ambulatory Visit (INDEPENDENT_AMBULATORY_CARE_PROVIDER_SITE_OTHER): Payer: No Typology Code available for payment source | Admitting: *Deleted

## 2020-04-11 DIAGNOSIS — J309 Allergic rhinitis, unspecified: Secondary | ICD-10-CM | POA: Diagnosis not present

## 2020-04-18 ENCOUNTER — Ambulatory Visit (INDEPENDENT_AMBULATORY_CARE_PROVIDER_SITE_OTHER): Payer: No Typology Code available for payment source | Admitting: Family Medicine

## 2020-04-18 ENCOUNTER — Encounter: Payer: Self-pay | Admitting: Family Medicine

## 2020-04-18 ENCOUNTER — Ambulatory Visit (INDEPENDENT_AMBULATORY_CARE_PROVIDER_SITE_OTHER): Payer: No Typology Code available for payment source | Admitting: *Deleted

## 2020-04-18 ENCOUNTER — Other Ambulatory Visit: Payer: Self-pay

## 2020-04-18 VITALS — BP 140/76 | HR 67 | Temp 98.2°F | Ht 65.0 in | Wt 146.2 lb

## 2020-04-18 DIAGNOSIS — I10 Essential (primary) hypertension: Secondary | ICD-10-CM | POA: Diagnosis not present

## 2020-04-18 DIAGNOSIS — G35 Multiple sclerosis: Secondary | ICD-10-CM | POA: Diagnosis not present

## 2020-04-18 DIAGNOSIS — E669 Obesity, unspecified: Secondary | ICD-10-CM

## 2020-04-18 DIAGNOSIS — E559 Vitamin D deficiency, unspecified: Secondary | ICD-10-CM

## 2020-04-18 DIAGNOSIS — Z0001 Encounter for general adult medical examination with abnormal findings: Secondary | ICD-10-CM | POA: Diagnosis not present

## 2020-04-18 DIAGNOSIS — J309 Allergic rhinitis, unspecified: Secondary | ICD-10-CM | POA: Diagnosis not present

## 2020-04-18 DIAGNOSIS — Z6830 Body mass index (BMI) 30.0-30.9, adult: Secondary | ICD-10-CM

## 2020-04-18 NOTE — Assessment & Plan Note (Signed)
Patient has been 50 pounds over the last 6 months or so.  She has been working with healthy weight management.  Congratulated patient on weight loss and lifestyle modifications.

## 2020-04-18 NOTE — Patient Instructions (Signed)
It was very nice to see you today!  Keep up the good work!  No changes today.  I will see you back in a year. Come back to see me sooner if needed.   Take care, Dr Parker  PLEASE NOTE:  If you had any lab tests please let us know if you have not heard back within a few days. You may see your results on mychart before we have a chance to review them but we will give you a call once they are reviewed by us. If we ordered any referrals today, please let us know if you have not heard from their office within the next week.   Please try these tips to maintain a healthy lifestyle:   Eat at least 3 REAL meals and 1-2 snacks per day.  Aim for no more than 5 hours between eating.  If you eat breakfast, please do so within one hour of getting up.    Each meal should contain half fruits/vegetables, one quarter protein, and one quarter carbs (no bigger than a computer mouse)   Cut down on sweet beverages. This includes juice, soda, and sweet tea.     Drink at least 1 glass of water with each meal and aim for at least 8 glasses per day   Exercise at least 150 minutes every week.    Preventive Care 40-64 Years Old, Female Preventive care refers to lifestyle choices and visits with your health care provider that can promote health and wellness. This includes:  A yearly physical exam. This is also called an annual wellness visit.  Regular dental and eye exams.  Immunizations.  Screening for certain conditions.  Healthy lifestyle choices, such as: ? Eating a healthy diet. ? Getting regular exercise. ? Not using drugs or products that contain nicotine and tobacco. ? Limiting alcohol use. What can I expect for my preventive care visit? Physical exam Your health care provider will check your:  Height and weight. These may be used to calculate your BMI (body mass index). BMI is a measurement that tells if you are at a healthy weight.  Heart rate and blood pressure.  Body  temperature.  Skin for abnormal spots. Counseling Your health care provider may ask you questions about your:  Past medical problems.  Family's medical history.  Alcohol, tobacco, and drug use.  Emotional well-being.  Home life and relationship well-being.  Sexual activity.  Diet, exercise, and sleep habits.  Work and work environment.  Access to firearms.  Method of birth control.  Menstrual cycle.  Pregnancy history. What immunizations do I need? Vaccines are usually given at various ages, according to a schedule. Your health care provider will recommend vaccines for you based on your age, medical history, and lifestyle or other factors, such as travel or where you work.   What tests do I need? Blood tests  Lipid and cholesterol levels. These may be checked every 5 years, or more often if you are over 50 years old.  Hepatitis C test.  Hepatitis B test. Screening  Lung cancer screening. You may have this screening every year starting at age 55 if you have a 30-pack-year history of smoking and currently smoke or have quit within the past 15 years.  Colorectal cancer screening. ? All adults should have this screening starting at age 50 and continuing until age 75. ? Your health care provider may recommend screening at age 45 if you are at increased risk. ? You will have tests   every 1-10 years, depending on your results and the type of screening test.  Diabetes screening. ? This is done by checking your blood sugar (glucose) after you have not eaten for a while (fasting). ? You may have this done every 1-3 years.  Mammogram. ? This may be done every 1-2 years. ? Talk with your health care provider about when you should start having regular mammograms. This may depend on whether you have a family history of breast cancer.  BRCA-related cancer screening. This may be done if you have a family history of breast, ovarian, tubal, or peritoneal cancers.  Pelvic exam  and Pap test. ? This may be done every 3 years starting at age 80. ? Starting at age 6, this may be done every 5 years if you have a Pap test in combination with an HPV test. Other tests  STD (sexually transmitted disease) testing, if you are at risk.  Bone density scan. This is done to screen for osteoporosis. You may have this scan if you are at high risk for osteoporosis. Talk with your health care provider about your test results, treatment options, and if necessary, the need for more tests. Follow these instructions at home: Eating and drinking  Eat a diet that includes fresh fruits and vegetables, whole grains, lean protein, and low-fat dairy products.  Take vitamin and mineral supplements as recommended by your health care provider.  Do not drink alcohol if: ? Your health care provider tells you not to drink. ? You are pregnant, may be pregnant, or are planning to become pregnant.  If you drink alcohol: ? Limit how much you have to 0-1 drink a day. ? Be aware of how much alcohol is in your drink. In the [place] one drink equals one 12 oz bottle of beer (355 mL), one 5 oz glass of wine (148 mL), or one 1 oz glass of hard liquor (44 mL).   Lifestyle  Take daily care of your teeth and gums. Brush your teeth every morning and night with fluoride toothpaste. Floss one time each day.  Stay active. Exercise for at least 30 minutes 5 or more days each week.  Do not use any products that contain nicotine or tobacco, such as cigarettes, e-cigarettes, and chewing tobacco. If you need help quitting, ask your health care provider.  Do not use drugs.  If you are sexually active, practice safe sex. Use a condom or other form of protection to prevent STIs (sexually transmitted infections).  If you do not wish to become pregnant, use a form of birth control. If you plan to become pregnant, see your health care provider for a prepregnancy visit.  If told by your health care provider, take  low-dose aspirin daily starting at age 47.  Find healthy ways to cope with stress, such as: ? Meditation, yoga, or listening to music. ? Journaling. ? Talking to a trusted person. ? Spending time with friends and family. Safety  Always wear your seat belt while driving or riding in a vehicle.  Do not drive: ? If you have been drinking alcohol. Do not ride with someone who has been drinking. ? When you are tired or distracted. ? While texting.  Wear a helmet and other protective equipment during sports activities.  If you have firearms in your house, make sure you follow all gun safety procedures. What's next?  Visit your health care provider once a year for an annual wellness visit.  Ask your health care provider  how often you should have your eyes and teeth checked.  Stay up to date on all vaccines. This information is not intended to replace advice given to you by your health care provider. Make sure you discuss any questions you have with your health care provider. Document Revised: 09/26/2019 Document Reviewed: 09/02/2017 Elsevier Patient Education  2021 [employer].

## 2020-04-18 NOTE — Assessment & Plan Note (Signed)
Continue management per neurology.  No recent flares.

## 2020-04-18 NOTE — Assessment & Plan Note (Signed)
At goal off losartan.

## 2020-04-18 NOTE — Progress Notes (Signed)
Chief Complaint:  Emily Wolfe is a 65 y.o. female who presents today for her annual comprehensive physical exam.    Assessment/Plan:  Chronic Problems Addressed Today: MS (multiple sclerosis) ([place]) Continue management per neurology.  No recent flares.  Essential hypertension At goal off losartan.  Class 1 obesity with serious comorbidity and body mass index (BMI) of 30.0 to 30.9 in adult Patient has been 50 pounds over the last 6 months or so.  She has been working with healthy weight management.  Congratulated patient on weight loss and lifestyle modifications.  Vitamin D deficiency On vitamin D replacement per healthy weight management.  She will be getting levels drawn soon.   Preventative Healthcare: She will be getting labs done soon.  She is up-to-date on colon cancer screening.  Due for Tdap but she will time this with her allergy shots.  She will also be getting her second dose of shingles vaccine soon.  We will be getting pelvic exam set as well.  Had mammogram done a couple months ago which was normal.  Patient Counseling(The following topics were reviewed and/or handout was given):  -Nutrition: Stressed importance of moderation in sodium/caffeine intake, saturated fat and cholesterol, caloric balance, sufficient intake of fresh fruits, vegetables, and fiber.  -Stressed the importance of regular exercise.   -Substance Abuse: Discussed cessation/primary prevention of tobacco, alcohol, or other drug use; driving or other dangerous activities under the influence; availability of treatment for abuse.   -Injury prevention: Discussed safety belts, safety helmets, smoke detector, smoking near bedding or upholstery.   -Sexuality: Discussed sexually transmitted diseases, partner selection, use of condoms, avoidance of unintended pregnancy and contraceptive alternatives.   -Dental health: Discussed importance of regular tooth brushing, flossing, and dental visits.  -Health  maintenance and immunizations reviewed. Please refer to Health maintenance section.  Return to care in 1 year for next preventative visit.     Subjective:  HPI:  She has no acute complaints today.   Lifestyle Diet: Balanced. Trying to get more protein.  Exercise: 30 minutes of cardio per day.   Depression screen PHQ 2/9 09/25/2019  Decreased Interest 1  Down, Depressed, Hopeless 1  PHQ - 2 Score 2  Altered sleeping 2  Tired, decreased energy 2  Change in appetite 2  Feeling bad or failure about yourself  1  Trouble concentrating 0  Moving slowly or fidgety/restless 0  Suicidal thoughts 0  PHQ-9 Score 9  Difficult doing work/chores Somewhat difficult    Health Maintenance Due  Topic Date Due  . TETANUS/TDAP  02/05/2017  . COVID-19 Vaccine (3 - Booster for Pfizer series) 10/25/2019     ROS: Per HPI, otherwise a complete review of systems was negative.   PMH:  The following were reviewed and entered/updated in epic: Past Medical History:  Diagnosis Date  . Anemia   . Angio-edema   . Back pain   . Bilateral swelling of feet   . Cancer ([place])   . Chronic pain of both upper extremities   . Eczema   . Gallbladder problem   . Genital warts 11/19/2017  . Heartburn   . 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 History of ovarian cancer 11/19/2017  . History of stomach ulcers   . Hyperlipidemia 06/29/2017  . Hypertension 06/29/2017  . Joint pain   . Lactose intolerance   . Malignant neoplastic disease ([place]) 06/29/2017  . Mild intermittent asthma without complication 14/78/2956  . Multiple sclerosis ([place]) 06/29/2017  .  Osteoarthritis   . Palpitations   . Postmenopausal 06/29/2017  . Scoliosis of lumbar spine 06/29/2017  . Small bowel obstruction ([place]) 06/29/2017  . Swallowing difficulty   . Urticaria   . 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 Vitamin D deficiency 06/29/2017   Patient Active Problem List   Diagnosis Date Noted  .  Class 1 obesity with serious comorbidity and body mass index (BMI) of 30.0 to 30.9 in adult 01/26/2020  . SBO (small bowel obstruction) ([place]) 03/21/2019  . Nightmares 03/07/2019  . Anxiety 12/20/2018  . Cervicalgia 08/18/2018  . Cough variant asthma vs UACS 01/03/2018  . History of total hysterectomy, due to ovarian cancer 12/26/2017  . Genital warts 11/19/2017  . History of ovarian cancer 11/19/2017  . Seasonal and perennial allergic rhinitis, followed by Allergist, treated with saline rinses and allergy shots 11/02/2017  . Mild intermittent asthma without complication 16/10/9602  . Hyperlipidemia 06/29/2017  . Essential hypertension 06/29/2017  . MS (multiple sclerosis) ([place]) 06/29/2017  . Scoliosis of lumbar spine 06/29/2017  . 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 Vitamin D deficiency 06/29/2017  . 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 Lung mass 06/29/2017  . Colon abnormality 05/11/2017   Past Surgical History:  Procedure Laterality Date  . ADENOIDECTOMY    . CHOLECYSTECTOMY  2009  . HERNIA REPAIR    . HERNIA REPAIR     6/10, 9/10  . HYSTERECTOMY ABDOMINAL WITH SALPINGO-OOPHORECTOMY    . HYSTEROTOMY  2008  . LAPAROSCOPIC LYSIS OF ADHESIONS     , With small bowel resection and ventral hernia repair with mesh 2019/[place]  . MOHS SURGERY    . SINOSCOPY    . TONSILLECTOMY    . TYMPANOSTOMY TUBE PLACEMENT    . VARICOSE VEIN SURGERY      Family History  Problem Relation Age of Onset  . Breast cancer Mother   . Cancer Mother   . Cervical cancer Maternal Grandmother     Medications- reviewed and updated Current Outpatient Medications  Medication Sig Dispense Refill  . azelastine (ASTELIN) 0.1 % nasal spray PLACE 2 SPRAYS INTO BOTH NOSTRILS 2 TIMES DAILY. 30 mL 0  . Cholecalciferol (VITAMIN D3) 1.25 MG (50000 UT) CAPS Take 50,000 Units by mouth once a week. On Sunday  3  . diclofenac sodium (VOLTAREN) 1 % GEL Apply 2 g  topically 4 (four) times daily as needed (knee pain).     . DULoxetine (CYMBALTA) 60 MG capsule Take 60 mg by mouth daily.    Marland Kitchen EPINEPHrine (AUVI-Q) 0.3 mg/0.3 mL IJ SOAJ injection Inject 0.3 mg into the muscle as needed for anaphylaxis. 2 each 2  . ipratropium (ATROVENT) 0.06 % nasal spray Place 2 sprays into both nostrils 4 (four) times daily. 15 mL 5  . leflunomide (ARAVA) 10 MG tablet Take 10 mg by mouth daily.    Marland Kitchen loratadine (CLARITIN) 10 MG tablet Take 10 mg by mouth daily.    . Magnesium-Zinc (MAGNESIUM-CHELATED ZINC PO) Take 1 tablet by mouth daily.     No current facility-administered medications for this visit.    Allergies-reviewed and updated No Known Allergies  Social History   Socioeconomic History  . Marital status: Divorced    Spouse name: Not on file  . Number of children: Not on file  . Years of education: Not on file  . Highest education level: Not on file  Occupational History  . Not on file  Tobacco  Use  . Smoking status: Never Smoker  . Smokeless tobacco: Never Used  Vaping Use  . Vaping Use: Never used  Substance and Sexual Activity  . Alcohol use: Not Currently  . Drug use: Not Currently  . Sexual activity: Not on file  Other Topics Concern  . Not on file  Social History Narrative   The patient does wear seatbelts. The patient does use sunscreen or protective clothing regularly. She does participate in regular exercise. Her exercise is: elliptical, approximately one time per week. She does not have a secured firearm in the home. She denies a history of intimate partner violence.   Social Determinants of Health   Financial Resource Strain: Not on file  Food Insecurity: Not on file  Transportation Needs: Not on file  Physical Activity: Not on file  Stress: Not on file  Social Connections: Not on file        Objective:  Physical Exam: BP 140/76   Pulse 67   Temp 98.2 F (36.8 C) (Temporal)   Ht 5\' 5"  (1.651 m)   Wt 146 lb 3.2 oz (66.3 kg)    SpO2 98%   BMI 24.33 kg/m   Body mass index is 24.33 kg/m. Wt Readings from Last 3 Encounters:  04/18/20 146 lb 3.2 oz (66.3 kg)  03/12/20 148 lb (67.1 kg)  02/22/20 153 lb (69.4 kg)   Gen: NAD, resting comfortably HEENT: TMs normal bilaterally. OP clear. No thyromegaly noted.  CV: RRR with no murmurs appreciated Pulm: NWOB, CTAB with no crackles, wheezes, or rhonchi GI: Normal bowel sounds present. Soft, Nontender, Nondistended. MSK: no edema, cyanosis, or clubbing noted Skin: warm, dry Neuro: CN2-12 grossly intact. Strength 5/5 in upper and lower extremities. Reflexes symmetric and intact bilaterally.  Psych: Normal affect and thought content     Emily Wolfe M. Jerline Pain, MD 04/18/2020 1:59 PM

## 2020-04-18 NOTE — Assessment & Plan Note (Signed)
On vitamin D replacement per healthy weight management.  She will be getting levels drawn soon.

## 2020-04-22 ENCOUNTER — Encounter (INDEPENDENT_AMBULATORY_CARE_PROVIDER_SITE_OTHER): Payer: Self-pay | Admitting: Family Medicine

## 2020-04-22 ENCOUNTER — Other Ambulatory Visit: Payer: Self-pay

## 2020-04-22 ENCOUNTER — Ambulatory Visit (INDEPENDENT_AMBULATORY_CARE_PROVIDER_SITE_OTHER): Payer: No Typology Code available for payment source | Admitting: Family Medicine

## 2020-04-22 VITALS — BP 137/82 | HR 67 | Temp 97.8°F | Ht 65.0 in | Wt 142.0 lb

## 2020-04-22 DIAGNOSIS — E669 Obesity, unspecified: Secondary | ICD-10-CM

## 2020-04-22 DIAGNOSIS — G35 Multiple sclerosis: Secondary | ICD-10-CM | POA: Diagnosis not present

## 2020-04-22 DIAGNOSIS — R948 Abnormal results of function studies of other organs and systems: Secondary | ICD-10-CM

## 2020-04-22 DIAGNOSIS — E673 Hypervitaminosis D: Secondary | ICD-10-CM | POA: Diagnosis not present

## 2020-04-22 DIAGNOSIS — Z9189 Other specified personal risk factors, not elsewhere classified: Secondary | ICD-10-CM | POA: Diagnosis not present

## 2020-04-22 DIAGNOSIS — Z6831 Body mass index (BMI) 31.0-31.9, adult: Secondary | ICD-10-CM

## 2020-04-22 DIAGNOSIS — E559 Vitamin D deficiency, unspecified: Secondary | ICD-10-CM

## 2020-04-23 LAB — VITAMIN D 25 HYDROXY (VIT D DEFICIENCY, FRACTURES): Vit D, 25-Hydroxy: 120 ng/mL — ABNORMAL HIGH (ref 30.0–100.0)

## 2020-04-23 NOTE — Progress Notes (Signed)
Chief Complaint:   OBESITY Emily Wolfe is here to discuss her progress with her obesity treatment plan along with follow-up of her obesity related diagnoses.   Today's visit was #: 11 Starting weight: 187 lbs Starting date: 09/25/2019 Today's weight: 142 lbs Today's date: 04/22/2020 Total lbs lost to date: 45 lbs Body mass index is 23.63 kg/m.  Total weight loss percentage to date: -24.06%  Interim History:  Emily Wolfe will get IC at next visit for maintenance calorie goal. Current Meal Plan: keeping a food journal and adhering to recommended goals of 1000-1100 calories and 85 grams of protein for 95% of the time.  Current Exercise Plan: Cardio for 30 minutes 7 times per week.  Assessment/Plan:   1. Hypervitaminosis D Elevated. Current vitamin D is 117.0, tested on 01/11/2020. Optimal goal > 50 ng/dL.   Plan: Will check vitamin D level today, as per below.  - VITAMIN D 25 Hydroxy (Vit-D Deficiency, Fractures)  2. Abnormal metabolism - low Emily Wolfe's metabolism is lower than normal. New IC planned for next visit.  3. MS (multiple sclerosis) (HCC) Emily Wolfe is followed by Neurology.  Will continue to follow along.  4. At risk for impaired metabolic function Due to Emily Wolfe's current state of health and medical condition(s), she is at a significantly higher risk for impaired metabolic function.   At least 8 minutes was spent on counseling Emily Wolfe about these concerns today.  This places the patient at a much greater risk to subsequently develop cardio-pulmonary conditions that can negatively affect the patient's quality of life.  I stressed the importance of reversing these risks factors.  The initial goal is to lose at least 5-10% of starting weight to help reduce risk factors.  Counseling:  Intensive lifestyle modifications discussed with Emily Wolfe as the most appropriate first line treatment.  She will continue to work on diet, exercise, and weight loss efforts.  We will continue to  reassess these conditions on a fairly regular basis in an attempt to decrease the patient's overall morbidity and mortality.  5. Obesity, current BMI 23.6  Course: Emily Wolfe is currently in the action stage of change. As such, her goal is to continue with weight loss efforts.   Nutrition goals: She has agreed to keeping a food journal and adhering to recommended goals of 1000-1100 calories and 85 grams of protein.   Exercise goals: As is.  Behavioral modification strategies: increasing lean protein intake, decreasing simple carbohydrates, increasing vegetables and increasing water intake.  Emily Wolfe has agreed to follow-up with our clinic in 4 weeks. She was informed of the importance of frequent follow-up visits to maximize her success with intensive lifestyle modifications for her multiple health conditions.   Objective:   Blood pressure 137/82, pulse 67, temperature 97.8 F (36.6 C), temperature source Oral, height 5\' 5"  (1.651 m), weight 142 lb (64.4 kg), SpO2 96 %. Body mass index is 23.63 kg/m.  General: Cooperative, alert, well developed, in no acute distress. HEENT: Conjunctivae and lids unremarkable. Cardiovascular: Regular rhythm.  Lungs: Normal work of breathing. Neurologic: No focal deficits.   Lab Results  Component Value Date   CREATININE 0.86 01/11/2020   BUN 23 01/11/2020   NA 141 01/11/2020   K 4.3 01/11/2020   CL 103 01/11/2020   CO2 23 01/11/2020   Lab Results  Component Value Date   ALT 11 01/11/2020   AST 12 01/11/2020   ALKPHOS 100 01/11/2020   BILITOT 0.3 01/11/2020   Lab Results  Component Value Date  HGBA1C 5.4 09/25/2019   Lab Results  Component Value Date   INSULIN 19.0 09/25/2019   Lab Results  Component Value Date   TSH 2.130 01/11/2020   Lab Results  Component Value Date   CHOL 199 01/11/2020   HDL 51 01/11/2020   LDLCALC 131 (H) 01/11/2020   TRIG 95 01/11/2020   CHOLHDL 4 03/07/2019   Lab Results  Component Value Date    WBC 4.7 01/11/2020   HGB 14.3 01/11/2020   HCT 43.4 01/11/2020   MCV 89 01/11/2020   PLT 177 01/11/2020   Lab Results  Component Value Date   IRON 55 01/11/2020   TIBC 264 01/11/2020   FERRITIN 158 (H) 01/11/2020   Attestation Statements:   Reviewed by clinician on day of visit: allergies, medications, problem list, medical history, surgical history, family history, social history, and previous encounter notes.  I, Water quality scientist, CMA, am acting as transcriptionist for Briscoe Deutscher, DO  I have reviewed the above documentation for accuracy and completeness, and I agree with the above. Briscoe Deutscher, DO

## 2020-04-30 ENCOUNTER — Ambulatory Visit: Payer: Self-pay | Admitting: *Deleted

## 2020-04-30 NOTE — Progress Notes (Signed)
VIALS EXP 04-30-21 

## 2020-05-01 DIAGNOSIS — J301 Allergic rhinitis due to pollen: Secondary | ICD-10-CM | POA: Diagnosis not present

## 2020-05-08 ENCOUNTER — Ambulatory Visit (INDEPENDENT_AMBULATORY_CARE_PROVIDER_SITE_OTHER): Payer: No Typology Code available for payment source

## 2020-05-08 DIAGNOSIS — J309 Allergic rhinitis, unspecified: Secondary | ICD-10-CM | POA: Diagnosis not present

## 2020-05-16 ENCOUNTER — Ambulatory Visit (INDEPENDENT_AMBULATORY_CARE_PROVIDER_SITE_OTHER): Payer: No Typology Code available for payment source | Admitting: *Deleted

## 2020-05-16 DIAGNOSIS — J309 Allergic rhinitis, unspecified: Secondary | ICD-10-CM

## 2020-05-22 ENCOUNTER — Encounter (INDEPENDENT_AMBULATORY_CARE_PROVIDER_SITE_OTHER): Payer: Self-pay | Admitting: Family Medicine

## 2020-05-23 ENCOUNTER — Ambulatory Visit (INDEPENDENT_AMBULATORY_CARE_PROVIDER_SITE_OTHER): Payer: No Typology Code available for payment source

## 2020-05-23 DIAGNOSIS — J309 Allergic rhinitis, unspecified: Secondary | ICD-10-CM | POA: Diagnosis not present

## 2020-05-27 ENCOUNTER — Encounter (INDEPENDENT_AMBULATORY_CARE_PROVIDER_SITE_OTHER): Payer: Self-pay | Admitting: Family Medicine

## 2020-05-27 ENCOUNTER — Ambulatory Visit (INDEPENDENT_AMBULATORY_CARE_PROVIDER_SITE_OTHER): Payer: No Typology Code available for payment source | Admitting: Family Medicine

## 2020-05-27 ENCOUNTER — Other Ambulatory Visit: Payer: Self-pay

## 2020-05-27 VITALS — BP 103/70 | HR 70 | Temp 97.6°F | Ht 65.0 in | Wt 133.0 lb

## 2020-05-27 DIAGNOSIS — G35D Multiple sclerosis, unspecified: Secondary | ICD-10-CM

## 2020-05-27 DIAGNOSIS — G35 Multiple sclerosis: Secondary | ICD-10-CM

## 2020-05-27 DIAGNOSIS — E669 Obesity, unspecified: Secondary | ICD-10-CM | POA: Diagnosis not present

## 2020-05-27 DIAGNOSIS — R948 Abnormal results of function studies of other organs and systems: Secondary | ICD-10-CM

## 2020-05-27 DIAGNOSIS — Z9189 Other specified personal risk factors, not elsewhere classified: Secondary | ICD-10-CM | POA: Diagnosis not present

## 2020-05-27 DIAGNOSIS — Z6831 Body mass index (BMI) 31.0-31.9, adult: Secondary | ICD-10-CM

## 2020-05-27 DIAGNOSIS — E673 Hypervitaminosis D: Secondary | ICD-10-CM | POA: Diagnosis not present

## 2020-05-27 NOTE — Progress Notes (Signed)
Chief Complaint:   OBESITY Emily Wolfe is here to discuss her progress with her obesity treatment plan along with follow-up of her obesity related diagnoses.   Today's visit was #: 12 Starting weight: 187 lbs Starting date: 09/25/2019 Total lbs lost to date: 54 lbs Total weight loss percentage to date: -28.88%  Interim History: New IC today with stable RMR. Stopped vitamin D 1 month ago. Labs at next visit. Nutrition Plan: Keeping a food journal and adhering to recommended goals of 1000-1100 calories and 85 grams of protein for 99% of the time. Activity: Cardio and yard work for 30 minutes 7 times per week.  Assessment/Plan:   1. Hypervitaminosis D Elevated. Current vitamin D is 120.0, tested on 04/22/2020. Optimal goal > 50 ng/dL.  She stopped her vitamin D 1 month ago.  Plan:  Will check labs at next visit.  2. Multiple sclerosis (HCC) Emily Wolfe is followed by Neurology.  We will continue to monitor symptoms as they relate to her weight loss journey.  3. Abnormal metabolism, slow Emily Wolfe has a slow metabolism. She will continue to focus on protein-rich, low simple carbohydrate foods. We reviewed the importance of hydration, regular exercise for stress reduction, and restorative sleep.   4. At risk for complication associated with hypotension Emily Wolfe was given approximately 8 minutes of education and counseling today regarding the condition of hypotension, the pathophysiology of the condition and concerns regarding prevention and treatment of this condition.  We discussed risks of medications used to treat hypertension, which in the setting of weight loss, can inadvertently cause hypotension.  Signs of hypotension such as feeling lightheaded or unsteady, esp when getting up first thing in the AM or off the toilet, were reviewed in detail and all questions were answered.  The use of motivational interviewing was employed as a technique as well today to aid in the treatment of  patient's multiple conditions.   5. Obesity, current BMI 22.1  Course: Emily Wolfe is currently in the action stage of change. As such, her goal is to continue with weight loss efforts.   Nutrition goals: She has agreed to keeping a food journal and adhering to recommended goals of 1100-1200 calories and 85 grams of protein.  Okay to increase by 100 calories daily if exercising and weight is stable.   Exercise goals: As is.  Behavioral modification strategies: increasing lean protein intake, decreasing simple carbohydrates, increasing vegetables and increasing water intake.  Emily Wolfe has agreed to follow-up with our clinic in 4 weeks. She was informed of the importance of frequent follow-up visits to maximize her success with intensive lifestyle modifications for her multiple health conditions.   Objective:   Blood pressure 103/70, pulse 70, temperature 97.6 F (36.4 C), temperature source Oral, height 5\' 5"  (1.651 m), weight 133 lb (60.3 kg), SpO2 95 %. Body mass index is 22.13 kg/m.  General: Cooperative, alert, well developed, in no acute distress. HEENT: Conjunctivae and lids unremarkable. Cardiovascular: Regular rhythm.  Lungs: Normal work of breathing. Neurologic: No focal deficits.   Lab Results  Component Value Date   CREATININE 0.86 01/11/2020   BUN 23 01/11/2020   NA 141 01/11/2020   K 4.3 01/11/2020   CL 103 01/11/2020   CO2 23 01/11/2020   Lab Results  Component Value Date   ALT 11 01/11/2020   AST 12 01/11/2020   ALKPHOS 100 01/11/2020   BILITOT 0.3 01/11/2020   Lab Results  Component Value Date   HGBA1C 5.4 09/25/2019   Lab  Results  Component Value Date   INSULIN 19.0 09/25/2019   Lab Results  Component Value Date   TSH 2.130 01/11/2020   Lab Results  Component Value Date   CHOL 199 01/11/2020   HDL 51 01/11/2020   LDLCALC 131 (H) 01/11/2020   TRIG 95 01/11/2020   CHOLHDL 4 03/07/2019   Lab Results  Component Value Date   WBC 4.7 01/11/2020    HGB 14.3 01/11/2020   HCT 43.4 01/11/2020   MCV 89 01/11/2020   PLT 177 01/11/2020   Lab Results  Component Value Date   IRON 55 01/11/2020   TIBC 264 01/11/2020   FERRITIN 158 (H) 01/11/2020   Attestation Statements:   Reviewed by clinician on day of visit: allergies, medications, problem list, medical history, surgical history, family history, social history, and previous encounter notes.  I, Water quality scientist, CMA, am acting as transcriptionist for Briscoe Deutscher, DO  I have reviewed the above documentation for accuracy and completeness, and I agree with the above. Briscoe Deutscher, DO

## 2020-05-30 ENCOUNTER — Ambulatory Visit (INDEPENDENT_AMBULATORY_CARE_PROVIDER_SITE_OTHER): Payer: No Typology Code available for payment source | Admitting: *Deleted

## 2020-05-30 DIAGNOSIS — J309 Allergic rhinitis, unspecified: Secondary | ICD-10-CM | POA: Diagnosis not present

## 2020-06-06 ENCOUNTER — Ambulatory Visit (INDEPENDENT_AMBULATORY_CARE_PROVIDER_SITE_OTHER): Payer: No Typology Code available for payment source | Admitting: *Deleted

## 2020-06-06 DIAGNOSIS — J309 Allergic rhinitis, unspecified: Secondary | ICD-10-CM

## 2020-06-11 ENCOUNTER — Encounter: Payer: Self-pay | Admitting: *Deleted

## 2020-06-11 ENCOUNTER — Other Ambulatory Visit: Payer: Self-pay

## 2020-06-11 ENCOUNTER — Ambulatory Visit (INDEPENDENT_AMBULATORY_CARE_PROVIDER_SITE_OTHER): Payer: No Typology Code available for payment source | Admitting: *Deleted

## 2020-06-11 ENCOUNTER — Encounter (INDEPENDENT_AMBULATORY_CARE_PROVIDER_SITE_OTHER): Payer: Self-pay

## 2020-06-11 DIAGNOSIS — Z23 Encounter for immunization: Secondary | ICD-10-CM | POA: Diagnosis not present

## 2020-06-11 NOTE — Progress Notes (Signed)
Per orders of Dr. Jerline Pain, injection of Tdap 0.5 ml IM given by Anselmo Pickler, LPN in right deltoid. Patient tolerated injection well.

## 2020-06-11 NOTE — Progress Notes (Signed)
I have reviewed the patient's encounter and agree with the documentation.  Algis Greenhouse. Jerline Pain, MD 06/11/2020 9:17 AM

## 2020-06-21 ENCOUNTER — Ambulatory Visit (INDEPENDENT_AMBULATORY_CARE_PROVIDER_SITE_OTHER): Payer: No Typology Code available for payment source

## 2020-06-21 DIAGNOSIS — J309 Allergic rhinitis, unspecified: Secondary | ICD-10-CM | POA: Diagnosis not present

## 2020-06-26 ENCOUNTER — Ambulatory Visit (INDEPENDENT_AMBULATORY_CARE_PROVIDER_SITE_OTHER): Payer: No Typology Code available for payment source | Admitting: Family Medicine

## 2020-06-27 ENCOUNTER — Ambulatory Visit (INDEPENDENT_AMBULATORY_CARE_PROVIDER_SITE_OTHER): Payer: No Typology Code available for payment source | Admitting: *Deleted

## 2020-06-27 DIAGNOSIS — J309 Allergic rhinitis, unspecified: Secondary | ICD-10-CM

## 2020-07-01 ENCOUNTER — Ambulatory Visit (INDEPENDENT_AMBULATORY_CARE_PROVIDER_SITE_OTHER): Payer: No Typology Code available for payment source | Admitting: Family Medicine

## 2020-07-01 ENCOUNTER — Other Ambulatory Visit: Payer: Self-pay

## 2020-07-01 ENCOUNTER — Encounter (INDEPENDENT_AMBULATORY_CARE_PROVIDER_SITE_OTHER): Payer: Self-pay | Admitting: Family Medicine

## 2020-07-01 VITALS — BP 114/72 | HR 70 | Temp 97.8°F | Ht 65.0 in | Wt 128.0 lb

## 2020-07-01 DIAGNOSIS — E782 Mixed hyperlipidemia: Secondary | ICD-10-CM

## 2020-07-01 DIAGNOSIS — I10 Essential (primary) hypertension: Secondary | ICD-10-CM

## 2020-07-01 DIAGNOSIS — E673 Hypervitaminosis D: Secondary | ICD-10-CM | POA: Diagnosis not present

## 2020-07-01 DIAGNOSIS — Z6831 Body mass index (BMI) 31.0-31.9, adult: Secondary | ICD-10-CM

## 2020-07-01 DIAGNOSIS — Z79899 Other long term (current) drug therapy: Secondary | ICD-10-CM

## 2020-07-01 DIAGNOSIS — G35 Multiple sclerosis: Secondary | ICD-10-CM | POA: Diagnosis not present

## 2020-07-01 DIAGNOSIS — Z9189 Other specified personal risk factors, not elsewhere classified: Secondary | ICD-10-CM | POA: Diagnosis not present

## 2020-07-01 DIAGNOSIS — E669 Obesity, unspecified: Secondary | ICD-10-CM

## 2020-07-02 ENCOUNTER — Encounter (INDEPENDENT_AMBULATORY_CARE_PROVIDER_SITE_OTHER): Payer: Self-pay | Admitting: Family Medicine

## 2020-07-02 LAB — CBC WITH DIFFERENTIAL/PLATELET
Basophils Absolute: 0.1 10*3/uL (ref 0.0–0.2)
Basos: 2 %
EOS (ABSOLUTE): 0.1 10*3/uL (ref 0.0–0.4)
Eos: 3 %
Hematocrit: 43.5 % (ref 34.0–46.6)
Hemoglobin: 14.4 g/dL (ref 11.1–15.9)
Immature Grans (Abs): 0 10*3/uL (ref 0.0–0.1)
Immature Granulocytes: 0 %
Lymphocytes Absolute: 0.4 10*3/uL — ABNORMAL LOW (ref 0.7–3.1)
Lymphs: 11 %
MCH: 30 pg (ref 26.6–33.0)
MCHC: 33.1 g/dL (ref 31.5–35.7)
MCV: 91 fL (ref 79–97)
Monocytes Absolute: 0.3 10*3/uL (ref 0.1–0.9)
Monocytes: 8 %
Neutrophils Absolute: 3.1 10*3/uL (ref 1.4–7.0)
Neutrophils: 76 %
Platelets: 167 10*3/uL (ref 150–450)
RBC: 4.8 x10E6/uL (ref 3.77–5.28)
RDW: 12.8 % (ref 11.7–15.4)
WBC: 4 10*3/uL (ref 3.4–10.8)

## 2020-07-02 LAB — COMPREHENSIVE METABOLIC PANEL
ALT: 11 IU/L (ref 0–32)
AST: 15 IU/L (ref 0–40)
Albumin/Globulin Ratio: 2.6 — ABNORMAL HIGH (ref 1.2–2.2)
Albumin: 4.6 g/dL (ref 3.8–4.8)
Alkaline Phosphatase: 87 IU/L (ref 44–121)
BUN/Creatinine Ratio: 24 (ref 12–28)
BUN: 20 mg/dL (ref 8–27)
Bilirubin Total: 0.5 mg/dL (ref 0.0–1.2)
CO2: 25 mmol/L (ref 20–29)
Calcium: 9.5 mg/dL (ref 8.7–10.3)
Chloride: 103 mmol/L (ref 96–106)
Creatinine, Ser: 0.84 mg/dL (ref 0.57–1.00)
Globulin, Total: 1.8 g/dL (ref 1.5–4.5)
Glucose: 84 mg/dL (ref 65–99)
Potassium: 4.8 mmol/L (ref 3.5–5.2)
Sodium: 144 mmol/L (ref 134–144)
Total Protein: 6.4 g/dL (ref 6.0–8.5)
eGFR: 78 mL/min/{1.73_m2} (ref >59)

## 2020-07-02 LAB — LIPID PANEL
Chol/HDL Ratio: 3.6 ratio (ref 0.0–4.4)
Cholesterol, Total: 211 mg/dL — ABNORMAL HIGH (ref 100–199)
HDL: 58 mg/dL (ref >39)
LDL Chol Calc (NIH): 139 mg/dL — ABNORMAL HIGH (ref 0–99)
Triglycerides: 79 mg/dL (ref 0–149)
VLDL Cholesterol Cal: 14 mg/dL (ref 5–40)

## 2020-07-02 LAB — VITAMIN B12: Vitamin B-12: >2000 pg/mL — ABNORMAL HIGH (ref 232–1245)

## 2020-07-02 LAB — VITAMIN D 25 HYDROXY (VIT D DEFICIENCY, FRACTURES): Vit D, 25-Hydroxy: 104 ng/mL — ABNORMAL HIGH (ref 30.0–100.0)

## 2020-07-02 LAB — INSULIN, RANDOM: INSULIN: 5.1 u[IU]/mL (ref 2.6–24.9)

## 2020-07-02 LAB — MAGNESIUM: Magnesium: 2.7 mg/dL — ABNORMAL HIGH (ref 1.6–2.3)

## 2020-07-02 NOTE — Telephone Encounter (Signed)
Last OV with Dr Wallace 

## 2020-07-04 ENCOUNTER — Ambulatory Visit (INDEPENDENT_AMBULATORY_CARE_PROVIDER_SITE_OTHER): Payer: No Typology Code available for payment source | Admitting: *Deleted

## 2020-07-04 DIAGNOSIS — J309 Allergic rhinitis, unspecified: Secondary | ICD-10-CM

## 2020-07-04 NOTE — Progress Notes (Signed)
Chief Complaint:   OBESITY Emily Wolfe is here to discuss her progress with her obesity treatment plan along with follow-up of her obesity related diagnoses.   Today's visit was #: 6 Starting weight: 187 lbs Starting date: 09/25/2019 Today's weight: 128 lbs Today's date: 07/01/2020 Weight change since last visit: 59 lbs Total lbs lost to date: -5 lbs Body mass index is 21.3 kg/m.  Total weight loss percentage to date: -31.55%  Interim History: Emily Wolfe is ready to maintain, last RMR was 1145. Current Meal Plan: keeping a food journal and adhering to recommended goals of 1100-1200 calories and 85 grams of protein for 90% of the time.  Current Exercise Plan: Cardio and yard work for 30 minutes 7 times per week. Current Anti-Obesity Medications: None.   Assessment/Plan:   1. Hypervitaminosis D Elevated. Plan: We will check labs today.  Lab Results  Component Value Date   VD25OH 120.0 (H) 04/22/2020   VD25OH 117.0 (H) 01/11/2020   - VITAMIN D 25 Hydroxy (Vit-D Deficiency, Fractures)  2. Essential hypertension Controlled. Medications: None.   Plan: Avoid buying foods that are: processed, frozen, or prepackaged to avoid excess salt. We will watch for signs of hypotension as she continues lifestyle modifications.  BP Readings from Last 3 Encounters:  07/01/20 114/72  05/27/20 103/70  04/22/20 137/82   Lab Results  Component Value Date   CREATININE 0.84 07/01/2020   - Comprehensive metabolic panel - Magnesium  3. Mixed hyperlipidemia Course: Controlled. Lipid-lowering medications: None. Plan: Dietary changes: Increase soluble fiber, decrease simple carbohydrates, decrease saturated fat. Exercise changes: Moderate to vigorous-intensity aerobic activity 150 minutes per week or as tolerated. We will continue to monitor along with PCP/specialists as it pertains to her weight loss journey.  Lab Results  Component Value Date   CHOL 199 01/11/2020   HDL 51 01/11/2020    LDLCALC 131 (H) 01/11/2020   TRIG 95 01/11/2020   CHOLHDL 2.6 01/11/2020   Lab Results  Component Value Date   ALT 11 01/11/2020   AST 12 01/11/2020   ALKPHOS 100 01/11/2020   BILITOT 0.3 01/11/2020   The 10-year ASCVD risk score Mikey Bussing DC Jr., et al., 2013) is: 4%   Values used to calculate the score:     Age: 65 years     Sex: Female     Is Non-Hispanic African American: No     Diabetic: No     Tobacco smoker: No     Systolic Blood Pressure: 761 mmHg     Is BP treated: No     HDL Cholesterol: 51 mg/dL     Total Cholesterol: 199 mg/dL  - Lipid panel - Insulin, random  4. MS (multiple sclerosis) (HCC) Emily Wolfe is followed by Neurology.  We will continue to monitor symptoms as they relate to her weight loss journey.   - CBC with Differential/Platelet - VITAMIN D 25 Hydroxy (Vit-D Deficiency, Fractures)  5. Medication management We will continue to monitor symptoms as they relate to her weight loss journey.  - Insulin, random - Vitamin B12  6. At high risk for malnutrition Emily Wolfe was given malnutrition prevention education and counseling today of more than 8 minutes.  Counseled her that malnutrition refers to inappropriate nutrients or not the right balance of nutrients for optimal health.  Discussed with Emily Wolfe that it is absolutely possible to be malnourished but yet obese.    7. Obesity, current BMI 21.4 Course: Emily Wolfe is currently in the action stage of change. As  such, her goal is to continue with weight loss efforts.   Nutrition goals: She has agreed to keeping a food journal and adhering to recommended goals of 1200-1300 calories and 85 grams of protein.   Exercise goals: Strength training  Behavioral modification strategies: increasing lean protein intake.  Emily Wolfe has agreed to follow-up with our clinic in 6 months. She was informed of the importance of frequent follow-up visits to maximize her success with intensive lifestyle modifications for  her multiple health conditions.   Emily Wolfe was informed we would discuss her lab results at her next visit unless there is a critical issue that needs to be addressed sooner. Emily Wolfe agreed to keep her next visit at the agreed upon time to discuss these results.  Objective:   Blood pressure 114/72, pulse 70, temperature 97.8 F (36.6 C), temperature source Oral, height 5\' 5"  (1.651 m), weight 128 lb (58.1 kg), SpO2 97 %. Body mass index is 21.3 kg/m.  General: Cooperative, alert, well developed, in no acute distress. HEENT: Conjunctivae and lids unremarkable. Cardiovascular: Regular rhythm.  Lungs: Normal work of breathing. Neurologic: No focal deficits.   Attestation Statements:   Reviewed by clinician on day of visit: allergies, medications, problem list, medical history, surgical history, family history, social history, and previous encounter notes.  Leodis Binet Friedenbach, CMA, am acting as Location manager for [employer], DO.  I have reviewed the above documentation for accuracy and completeness, and I agree with the above. Briscoe Deutscher, DO

## 2020-07-11 ENCOUNTER — Ambulatory Visit (INDEPENDENT_AMBULATORY_CARE_PROVIDER_SITE_OTHER): Payer: No Typology Code available for payment source | Admitting: *Deleted

## 2020-07-11 DIAGNOSIS — J309 Allergic rhinitis, unspecified: Secondary | ICD-10-CM | POA: Diagnosis not present

## 2020-07-17 DIAGNOSIS — J302 Other seasonal allergic rhinitis: Secondary | ICD-10-CM

## 2020-07-17 NOTE — Progress Notes (Signed)
VIALS MADE. EXP 07-17-21 

## 2020-07-18 ENCOUNTER — Ambulatory Visit (INDEPENDENT_AMBULATORY_CARE_PROVIDER_SITE_OTHER): Payer: No Typology Code available for payment source | Admitting: *Deleted

## 2020-07-18 DIAGNOSIS — J309 Allergic rhinitis, unspecified: Secondary | ICD-10-CM

## 2020-07-24 ENCOUNTER — Ambulatory Visit (INDEPENDENT_AMBULATORY_CARE_PROVIDER_SITE_OTHER): Payer: No Typology Code available for payment source

## 2020-07-24 DIAGNOSIS — J309 Allergic rhinitis, unspecified: Secondary | ICD-10-CM

## 2020-07-29 DIAGNOSIS — J301 Allergic rhinitis due to pollen: Secondary | ICD-10-CM | POA: Diagnosis not present

## 2020-07-31 ENCOUNTER — Ambulatory Visit (INDEPENDENT_AMBULATORY_CARE_PROVIDER_SITE_OTHER): Payer: No Typology Code available for payment source | Admitting: *Deleted

## 2020-07-31 DIAGNOSIS — J309 Allergic rhinitis, unspecified: Secondary | ICD-10-CM

## 2020-08-14 ENCOUNTER — Ambulatory Visit (INDEPENDENT_AMBULATORY_CARE_PROVIDER_SITE_OTHER): Payer: Medicare Other

## 2020-08-14 DIAGNOSIS — J309 Allergic rhinitis, unspecified: Secondary | ICD-10-CM | POA: Diagnosis not present

## 2020-08-21 ENCOUNTER — Ambulatory Visit (INDEPENDENT_AMBULATORY_CARE_PROVIDER_SITE_OTHER): Payer: Medicare Other

## 2020-08-21 DIAGNOSIS — J309 Allergic rhinitis, unspecified: Secondary | ICD-10-CM | POA: Diagnosis not present

## 2020-08-28 ENCOUNTER — Ambulatory Visit (INDEPENDENT_AMBULATORY_CARE_PROVIDER_SITE_OTHER): Payer: Medicare Other

## 2020-08-28 DIAGNOSIS — J309 Allergic rhinitis, unspecified: Secondary | ICD-10-CM | POA: Diagnosis not present

## 2020-08-31 ENCOUNTER — Emergency Department (HOSPITAL_BASED_OUTPATIENT_CLINIC_OR_DEPARTMENT_OTHER): Payer: Medicare Other

## 2020-08-31 ENCOUNTER — Encounter (HOSPITAL_BASED_OUTPATIENT_CLINIC_OR_DEPARTMENT_OTHER): Payer: Self-pay

## 2020-08-31 ENCOUNTER — Other Ambulatory Visit: Payer: Self-pay

## 2020-08-31 ENCOUNTER — Inpatient Hospital Stay (HOSPITAL_BASED_OUTPATIENT_CLINIC_OR_DEPARTMENT_OTHER)
Admission: EM | Admit: 2020-08-31 | Discharge: 2020-09-02 | DRG: 390 | Disposition: A | Discharge status: Home / Self Care | Payer: Medicare Other | Attending: Internal Medicine | Admitting: Internal Medicine

## 2020-08-31 DIAGNOSIS — E785 Hyperlipidemia, unspecified: Secondary | ICD-10-CM | POA: Diagnosis not present

## 2020-08-31 DIAGNOSIS — K56609 Unspecified intestinal obstruction, unspecified as to partial versus complete obstruction: Secondary | ICD-10-CM

## 2020-08-31 DIAGNOSIS — M4186 Other forms of scoliosis, lumbar region: Secondary | ICD-10-CM | POA: Diagnosis not present

## 2020-08-31 DIAGNOSIS — I10 Essential (primary) hypertension: Secondary | ICD-10-CM | POA: Diagnosis present

## 2020-08-31 DIAGNOSIS — J4520 Mild intermittent asthma, uncomplicated: Secondary | ICD-10-CM | POA: Diagnosis not present

## 2020-08-31 DIAGNOSIS — F419 Anxiety disorder, unspecified: Secondary | ICD-10-CM | POA: Diagnosis present

## 2020-08-31 DIAGNOSIS — Z20822 Contact with and (suspected) exposure to covid-19: Secondary | ICD-10-CM | POA: Diagnosis present

## 2020-08-31 DIAGNOSIS — M1990 Unspecified osteoarthritis, unspecified site: Secondary | ICD-10-CM | POA: Diagnosis not present

## 2020-08-31 DIAGNOSIS — Z66 Do not resuscitate: Secondary | ICD-10-CM | POA: Diagnosis present

## 2020-08-31 DIAGNOSIS — Z90710 Acquired absence of both cervix and uterus: Secondary | ICD-10-CM

## 2020-08-31 DIAGNOSIS — Z8711 Personal history of peptic ulcer disease: Secondary | ICD-10-CM

## 2020-08-31 DIAGNOSIS — Z8049 Family history of malignant neoplasm of other genital organs: Secondary | ICD-10-CM | POA: Diagnosis not present

## 2020-08-31 DIAGNOSIS — Z8543 Personal history of malignant neoplasm of ovary: Secondary | ICD-10-CM

## 2020-08-31 DIAGNOSIS — K565 Intestinal adhesions [bands], unspecified as to partial versus complete obstruction: Principal | ICD-10-CM | POA: Diagnosis present

## 2020-08-31 DIAGNOSIS — E860 Dehydration: Secondary | ICD-10-CM | POA: Diagnosis present

## 2020-08-31 DIAGNOSIS — E782 Mixed hyperlipidemia: Secondary | ICD-10-CM

## 2020-08-31 DIAGNOSIS — G8929 Other chronic pain: Secondary | ICD-10-CM | POA: Diagnosis present

## 2020-08-31 DIAGNOSIS — Z79899 Other long term (current) drug therapy: Secondary | ICD-10-CM

## 2020-08-31 DIAGNOSIS — G35 Multiple sclerosis: Secondary | ICD-10-CM | POA: Diagnosis present

## 2020-08-31 DIAGNOSIS — Z4682 Encounter for fitting and adjustment of non-vascular catheter: Secondary | ICD-10-CM | POA: Diagnosis not present

## 2020-08-31 DIAGNOSIS — K5669 Other partial intestinal obstruction: Secondary | ICD-10-CM | POA: Diagnosis not present

## 2020-08-31 DIAGNOSIS — Z803 Family history of malignant neoplasm of breast: Secondary | ICD-10-CM

## 2020-08-31 DIAGNOSIS — K5900 Constipation, unspecified: Secondary | ICD-10-CM | POA: Diagnosis not present

## 2020-08-31 DIAGNOSIS — Z9049 Acquired absence of other specified parts of digestive tract: Secondary | ICD-10-CM

## 2020-08-31 LAB — COMPREHENSIVE METABOLIC PANEL
ALT: 13 U/L (ref 0–44)
AST: 17 U/L (ref 15–41)
Albumin: 4.6 g/dL (ref 3.5–5.0)
Alkaline Phosphatase: 70 U/L (ref 38–126)
Anion gap: 11 (ref 5–15)
BUN: 26 mg/dL — ABNORMAL HIGH (ref 8–23)
CO2: 28 mmol/L (ref 22–32)
Calcium: 10.2 mg/dL (ref 8.9–10.3)
Chloride: 101 mmol/L (ref 98–111)
Creatinine, Ser: 0.77 mg/dL (ref 0.44–1.00)
GFR, Estimated: >60 mL/min (ref >60)
Glucose, Bld: 100 mg/dL — ABNORMAL HIGH (ref 70–99)
Potassium: 3.5 mmol/L (ref 3.5–5.1)
Sodium: 140 mmol/L (ref 135–145)
Total Bilirubin: 0.7 mg/dL (ref 0.3–1.2)
Total Protein: 7 g/dL (ref 6.5–8.1)

## 2020-08-31 LAB — RESP PANEL BY RT-PCR (FLU A&B, COVID) ARPGX2
Influenza A by PCR: NEGATIVE
Influenza B by PCR: NEGATIVE
SARS Coronavirus 2 by RT PCR: NEGATIVE

## 2020-08-31 LAB — CBC
HCT: 43.9 % (ref 36.0–46.0)
Hemoglobin: 14.4 g/dL (ref 12.0–15.0)
MCH: 29.6 pg (ref 26.0–34.0)
MCHC: 32.8 g/dL (ref 30.0–36.0)
MCV: 90.1 fL (ref 80.0–100.0)
Platelets: 163 10*3/uL (ref 150–400)
RBC: 4.87 MIL/uL (ref 3.87–5.11)
RDW: 13.5 % (ref 11.5–15.5)
WBC: 9.5 10*3/uL (ref 4.0–10.5)
nRBC: 0 % (ref 0.0–0.2)

## 2020-08-31 LAB — GLUCOSE, CAPILLARY: Glucose-Capillary: 90 mg/dL (ref 70–99)

## 2020-08-31 LAB — LIPASE, BLOOD: Lipase: 16 U/L (ref 11–51)

## 2020-08-31 IMAGING — CT CT ABD-PELV W/ CM
2 of 5 series · 16 of 46 positions shown, 18 images · IV contrast (APPLIED)
Comparison: CT abdomen pelvis dated [DATE].

CLINICAL DATA: Concern for bowel obstruction.

EXAM:
CT ABDOMEN AND PELVIS WITH CONTRAST
TECHNIQUE: Multidetector CT imaging of the abdomen and pelvis was performed
using the standard protocol following bolus administration of
intravenous contrast.
CONTRAST:  75mL OMNIPAQUE IOHEXOL 350 MG/ML SOLN

[Series 2: abd pel w · axial · 0.66mm/px · z∈[-898,-518]mm · 13 of 86 slices shown, 15 images]
[im 5/86  soft-tissue]
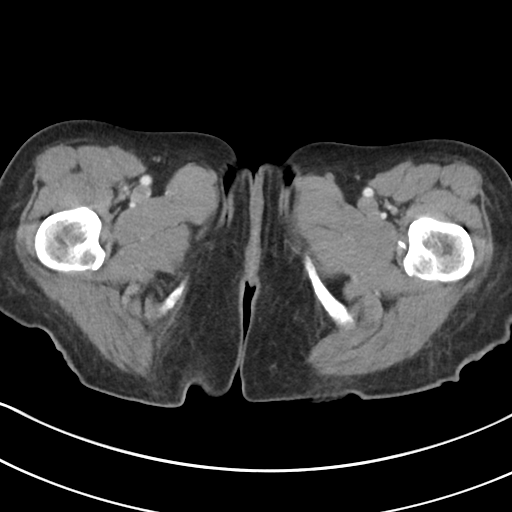
[im 5/86  bone]
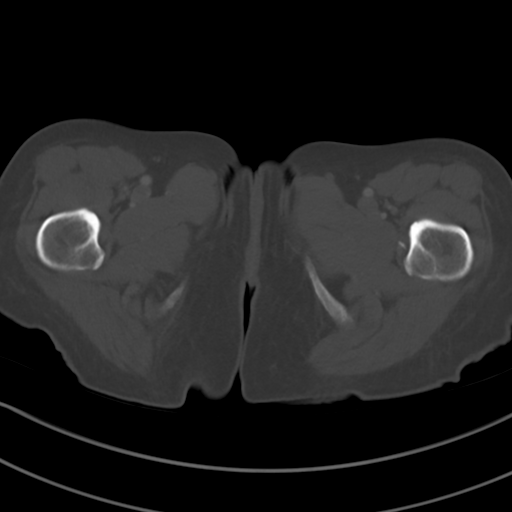
[im 10/86  soft-tissue]
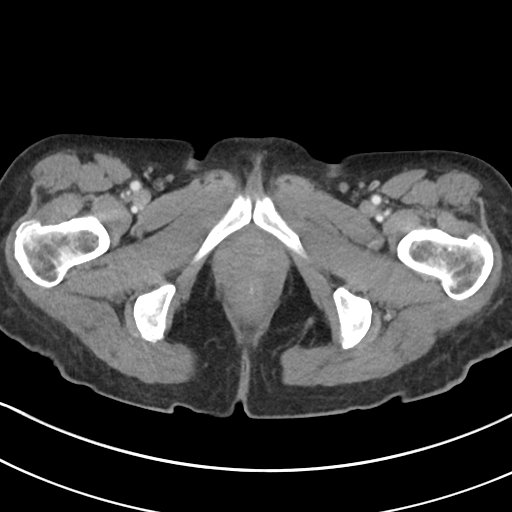
[im 19/86  soft-tissue]
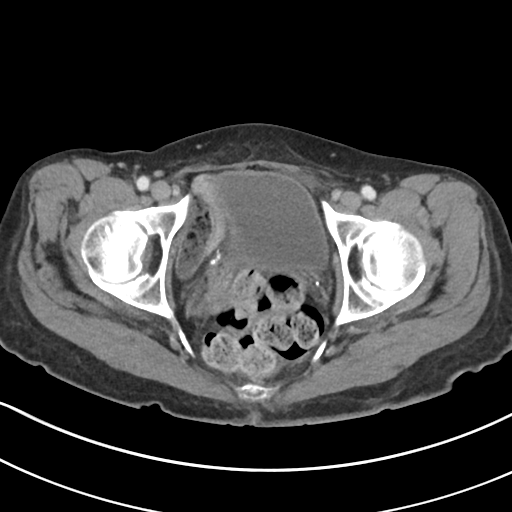
[im 24/86  soft-tissue]
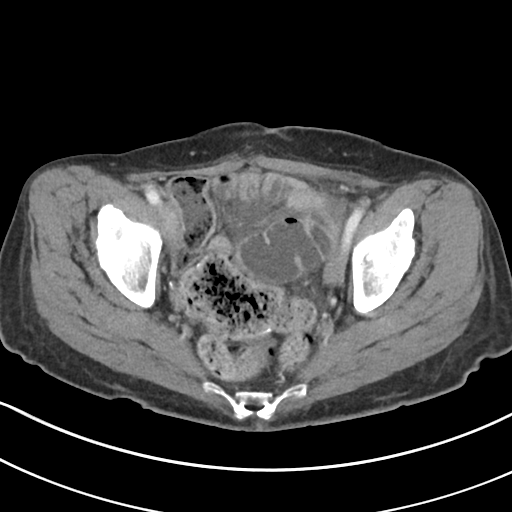
[im 29/86  soft-tissue]
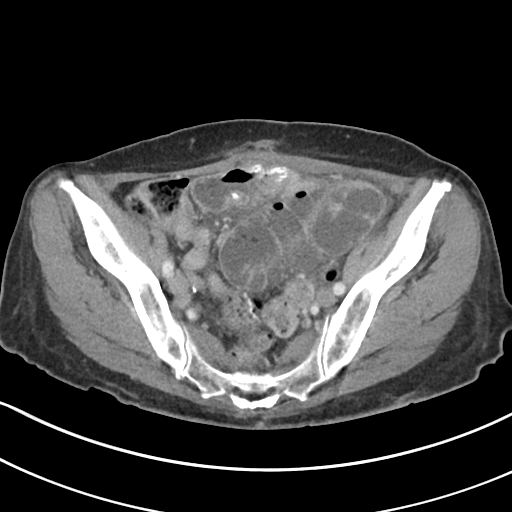
[im 38/86  soft-tissue]
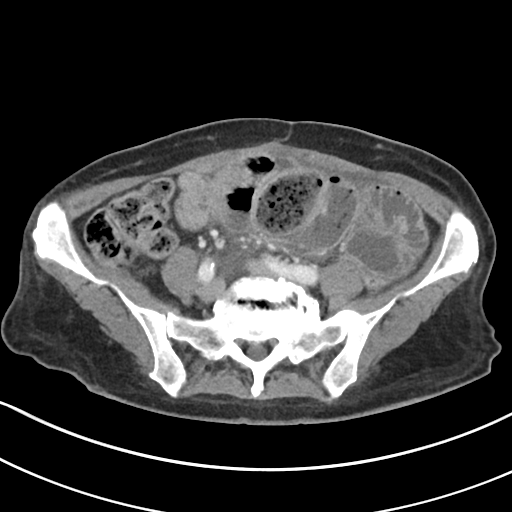
[im 43/86  soft-tissue]
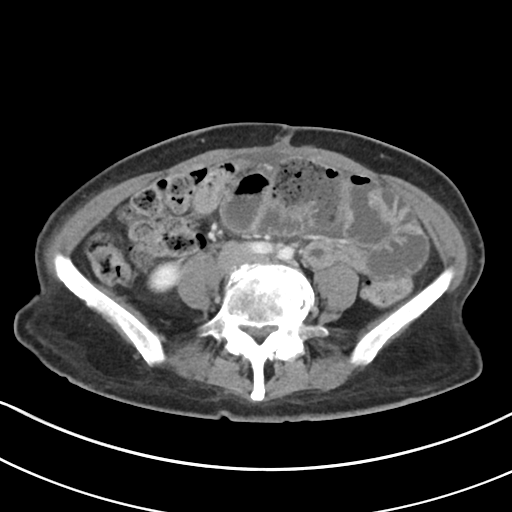
[im 48/86  soft-tissue]
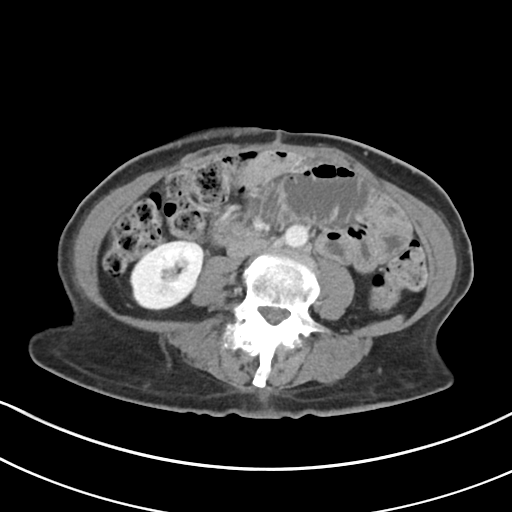
[im 57/86  soft-tissue]
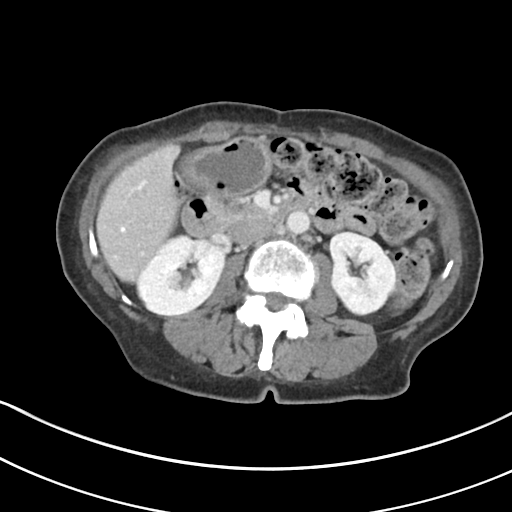
[im 57/86  bone]
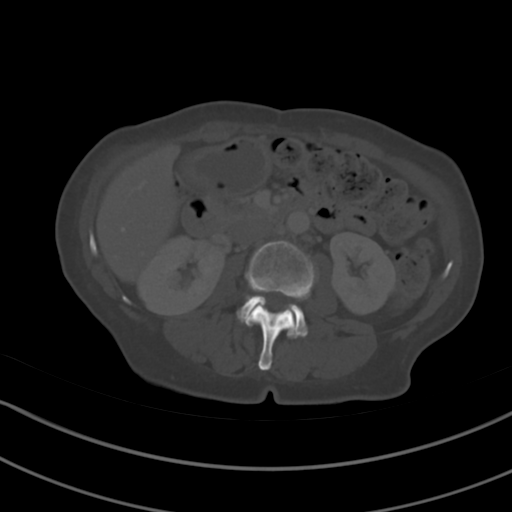
[im 62/86  soft-tissue]
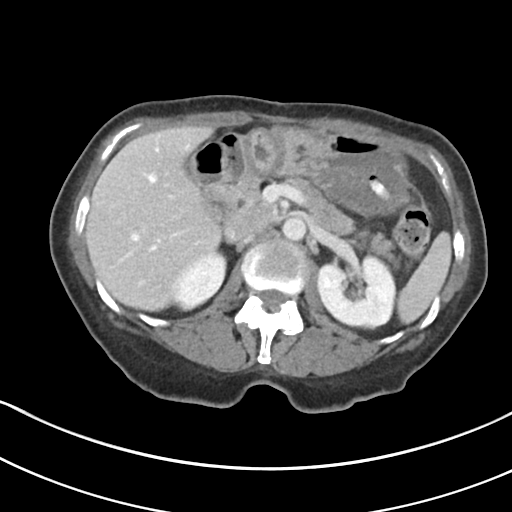
[im 67/86  soft-tissue]
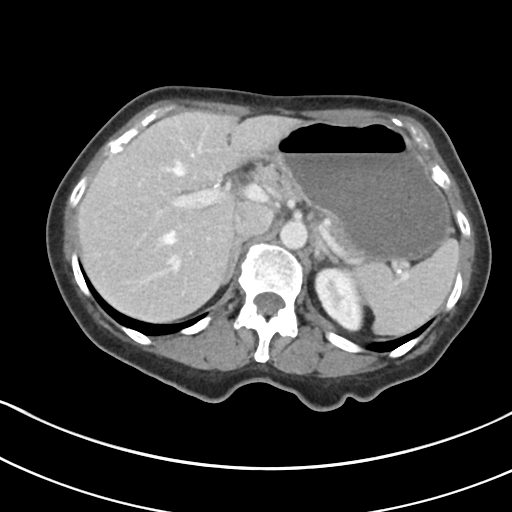
[im 76/86  soft-tissue]
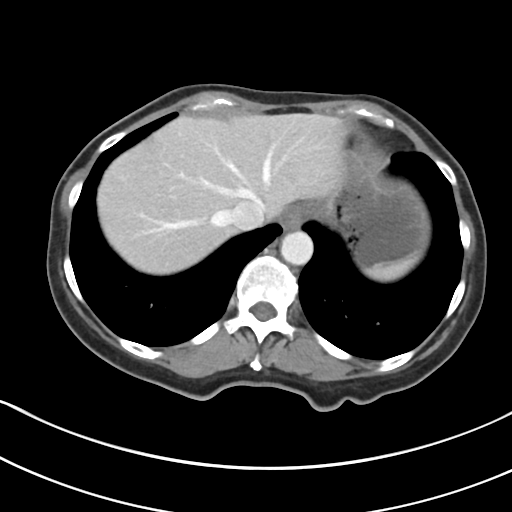
[im 81/86  soft-tissue]
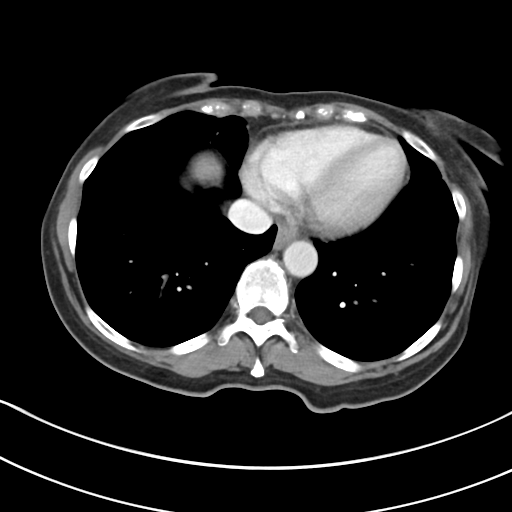

[Series 7: coronal · coronal · 0.69mm/px · 3 of 77 slices shown]
[im 26/77  soft-tissue]
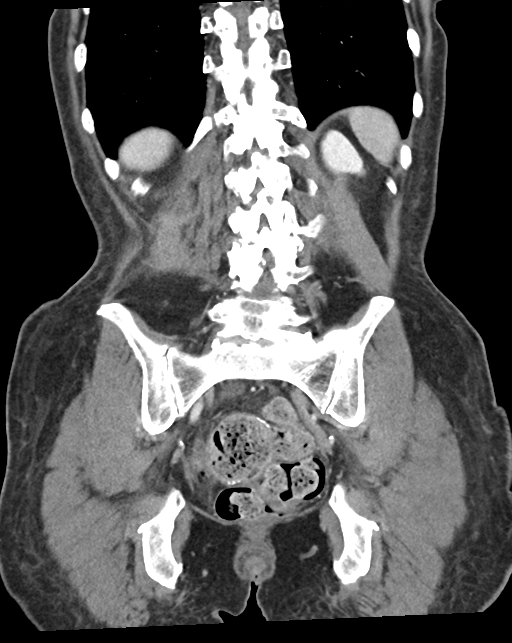
[im 34/77  soft-tissue]
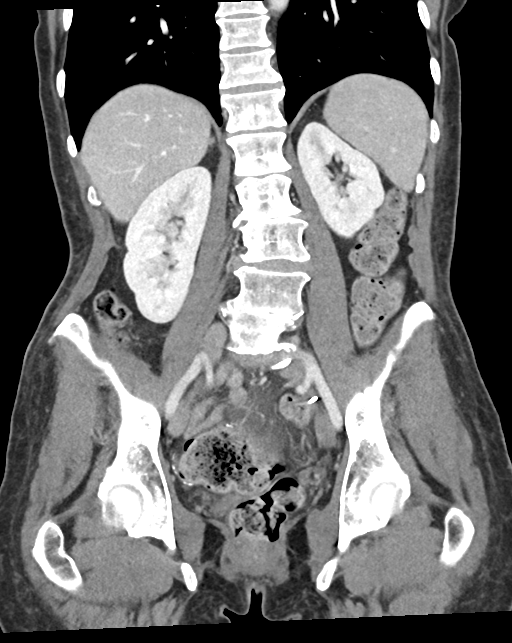
[im 43/77  soft-tissue]
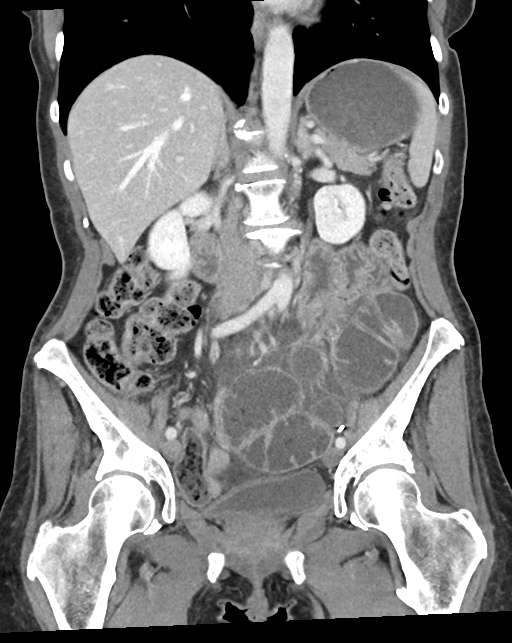

[16 of 46 positions shown; findings below may reference images not displayed]

FINDINGS: Lower chest: The visualized lung bases are clear.

No intra-abdominal free air.  Small free fluid within the pelvis.

Hepatobiliary: The liver is unremarkable. There is mild intrahepatic
biliary ductal dilatation, post cholecystectomy. No retained
calcified stone noted in the central CBD.

Pancreas: Unremarkable. No pancreatic ductal dilatation or
surrounding inflammatory changes.

Spleen: Normal in size without focal abnormality.

Adrenals/Urinary Tract: The adrenal glands unremarkable. The
kidneys, visualized ureters, and urinary bladder appear
unremarkable.

Stomach/Bowel: There is postsurgical changes of small bowel with
anastomotic suture in the lower abdomen. Multiple dilated and
fluid-filled loops of small bowel in the mid to lower abdomen
measure up to 4 cm in caliber. There is abutment of these loops of
small bowel to the anterior peritoneal wall consistent with
peritoneal adhesions. The distal small bowel and terminal ileum
demonstrate a normal caliber. Findings consistent with small-bowel
obstruction, likely related to adhesions with the transition point
likely in the midline along the peritoneal adhesion (50/2 and 60/7).
Surgical sutures also noted in the region of the sigmoid. There is
large amount of stool throughout the colon.

Vascular/Lymphatic: The abdominal aorta and IVC unremarkable. No
pain venous gas. There is no adenopathy.

Reproductive: Hysterectomy.

Other: Midline vertical anterior pelvic wall incisional scar.

Musculoskeletal: Degenerative changes of the spine. No acute osseous
pathology.
IMPRESSION: 1. Small-bowel obstruction, likely related to peritoneal adhesions
with the transition point likely in the midline along the peritoneal
adhesion.
2. Large amount of stool throughout the colon.

## 2020-08-31 IMAGING — DX DG CHEST 1V
1 series · 1 of 1 positions shown · non-contrast
Comparison: Earlier today

CLINICAL DATA: Evaluate NG tube placement

EXAM:
CHEST  1 VIEW

[chest]
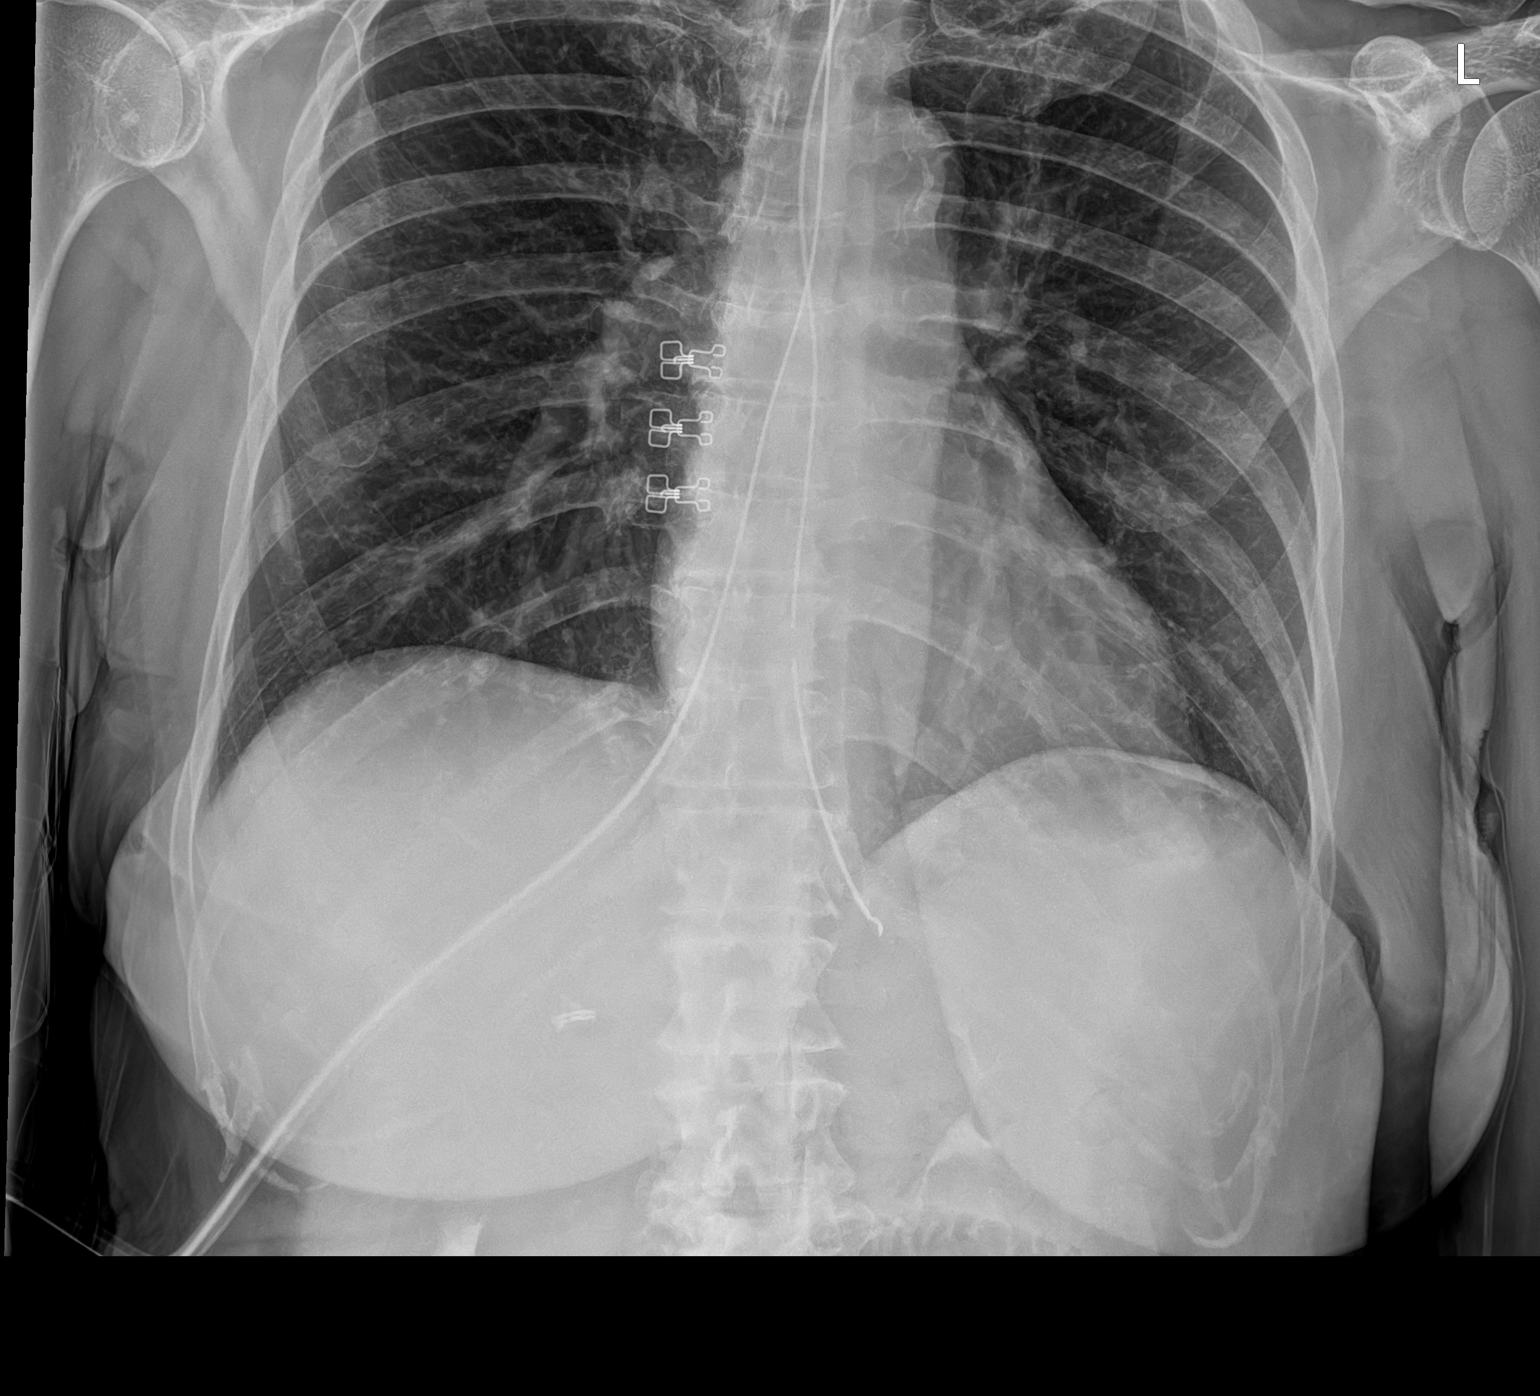

[1 of 1 positions shown; findings below may reference images not displayed]

FINDINGS: The nasogastric tube tip is just below the level of the GE junction
by approximately 1.9 cm with side port above the GE junction.
Recommend advancing NG tube by at least 10 cm.
IMPRESSION: Under advanced NG tube with side port above GE junction. Recommend
advancing by approximately 10 cm.

## 2020-08-31 MED ORDER — FENTANYL CITRATE (PF) 100 MCG/2ML IJ SOLN
50.0000 ug | Freq: Once | INTRAMUSCULAR | Status: AC
Start: 2020-08-31 — End: 2020-08-31
  Administered 2020-08-31: 50 ug via INTRAVENOUS
  Filled 2020-08-31: qty 2

## 2020-08-31 MED ORDER — AZELASTINE HCL 0.1 % NA SOLN
1.0000 | Freq: Two times a day (BID) | NASAL | Status: DC
  Administered 2020-09-01: 1 via NASAL
  Filled 2020-08-31: qty 30

## 2020-08-31 MED ORDER — SODIUM CHLORIDE 0.9 % IV SOLN: Freq: Once | INTRAVENOUS | Status: AC

## 2020-08-31 MED ORDER — ONDANSETRON HCL 4 MG/2ML IJ SOLN: 4.0000 mg | Freq: Four times a day (QID) | INTRAMUSCULAR | Status: DC | PRN

## 2020-08-31 MED ORDER — ONDANSETRON HCL 4 MG/2ML IJ SOLN
4.0000 mg | Freq: Once | INTRAMUSCULAR | Status: AC
  Administered 2020-08-31: 4 mg via INTRAVENOUS
  Filled 2020-08-31: qty 2

## 2020-08-31 MED ORDER — HYDROMORPHONE HCL 1 MG/ML IJ SOLN
0.5000 mg | Freq: Once | INTRAMUSCULAR | Status: AC
  Administered 2020-08-31: 0.5 mg via INTRAVENOUS
  Filled 2020-08-31: qty 1

## 2020-08-31 MED ORDER — ACETAMINOPHEN 325 MG PO TABS: 650.0000 mg | ORAL_TABLET | Freq: Four times a day (QID) | ORAL | Status: DC | PRN

## 2020-08-31 MED ORDER — DIATRIZOATE MEGLUMINE & SODIUM 66-10 % PO SOLN
90.0000 mL | Freq: Once | ORAL | Status: AC
  Administered 2020-08-31: 90 mL via NASOGASTRIC
  Filled 2020-08-31: qty 90

## 2020-08-31 MED ORDER — ACETAMINOPHEN 650 MG RE SUPP: 650.0000 mg | Freq: Four times a day (QID) | RECTAL | Status: DC | PRN

## 2020-08-31 MED ORDER — ONDANSETRON HCL 4 MG PO TABS: 4.0000 mg | ORAL_TABLET | Freq: Four times a day (QID) | ORAL | Status: DC | PRN

## 2020-08-31 MED ORDER — FENTANYL CITRATE (PF) 100 MCG/2ML IJ SOLN
50.0000 ug | INTRAMUSCULAR | Status: DC | PRN
  Administered 2020-08-31: 50 ug via INTRAVENOUS
  Filled 2020-08-31: qty 2

## 2020-08-31 MED ORDER — FENTANYL CITRATE (PF) 100 MCG/2ML IJ SOLN
50.0000 ug | Freq: Once | INTRAMUSCULAR | Status: AC
  Administered 2020-08-31: 50 ug via INTRAVENOUS
  Filled 2020-08-31: qty 2

## 2020-08-31 MED ORDER — HYDROMORPHONE HCL 1 MG/ML IJ SOLN
0.5000 mg | INTRAMUSCULAR | Status: DC | PRN
  Administered 2020-08-31 (×2): 0.5 mg via INTRAVENOUS
  Filled 2020-08-31 (×3): qty 0.5

## 2020-08-31 MED ORDER — SODIUM CHLORIDE 0.9 % IV SOLN: INTRAVENOUS | Status: DC

## 2020-08-31 MED ORDER — IOHEXOL 350 MG/ML SOLN
75.0000 mL | Freq: Once | INTRAVENOUS | Status: AC | PRN
  Administered 2020-08-31: 75 mL via INTRAVENOUS

## 2020-08-31 MED ORDER — ONDANSETRON HCL 4 MG/2ML IJ SOLN
4.0000 mg | Freq: Four times a day (QID) | INTRAMUSCULAR | Status: DC | PRN
  Administered 2020-08-31: 4 mg via INTRAVENOUS
  Filled 2020-08-31: qty 2

## 2020-08-31 MED ORDER — ENOXAPARIN SODIUM 40 MG/0.4ML IJ SOSY
40.0000 mg | PREFILLED_SYRINGE | INTRAMUSCULAR | Status: DC
  Administered 2020-08-31 – 2020-09-01 (×2): 40 mg via SUBCUTANEOUS
  Filled 2020-08-31 (×2): qty 0.4

## 2020-08-31 MED ORDER — IPRATROPIUM BROMIDE 0.06 % NA SOLN: 2.0000 | Freq: Four times a day (QID) | NASAL | Status: DC

## 2020-08-31 MED ORDER — SODIUM CHLORIDE 0.9 % IV BOLUS
1000.0000 mL | Freq: Once | INTRAVENOUS | Status: AC
  Administered 2020-08-31: 1000 mL via INTRAVENOUS

## 2020-08-31 NOTE — ED Provider Notes (Signed)
[facility] Provider Note: Georgena Spurling, MD, FACEP  CSN: [id number] MRN: [id number] ARRIVAL: 08/31/20 at [place]: [place]  Abdominal Pain   HISTORY OF PRESENT ILLNESS  08/31/20 2:09 AM Emily Wolfe is a 65 y.o. female with a history of small bowel obstructions following abdominal surgery.  She has a history of bowel resection due to adhesions.  She states she gets bowel obstructions about once a year.  She is here with pain that began yesterday evening about 5 PM.  She has had a pain in her central abdomen which she rates as a 7 out of 10.  It is burning in nature but also has a crampy component.  It is somewhat worse with palpation or movement.  She has had associated nausea and vomiting.  She has not had a bowel movement for 2 days.  She describes the pain as similar to previous bowel obstructions.   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   Mild intermittent asthma without complication 0000000   Multiple sclerosis ([place]) 06/29/2017   Osteoarthritis    Palpitations    Postmenopausal 06/29/2017   Scoliosis of lumbar spine 06/29/2017   Small bowel obstruction ([place]) 06/29/2017   Swallowing difficulty    Urticaria    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   Vitamin D deficiency 06/29/2017    Past Surgical History:  Procedure Laterality Date   ADENOIDECTOMY     CHOLECYSTECTOMY  2009   HERNIA REPAIR     HERNIA REPAIR     6/10, 9/10   HYSTERECTOMY ABDOMINAL WITH  SALPINGO-OOPHORECTOMY     HYSTEROTOMY  2008   LAPAROSCOPIC LYSIS OF ADHESIONS     , With small bowel resection and ventral hernia repair with mesh 2019/[place]   MOHS SURGERY     SINOSCOPY     TONSILLECTOMY     TYMPANOSTOMY TUBE PLACEMENT     VARICOSE VEIN SURGERY      Family History  Problem Relation Age of Onset   Breast cancer Mother    Cancer Mother    Cervical cancer Maternal Grandmother     Social History   Tobacco Use   Smoking status: Never   Smokeless tobacco: Never  Vaping Use   Vaping Use: Never used  Substance Use Topics   Alcohol use: Not Currently   Drug use: Not Currently    Prior to Admission medications   Medication Sig Start Date End Date Taking? Authorizing Provider  azelastine (ASTELIN) 0.1 % nasal spray PLACE 2 SPRAYS INTO BOTH NOSTRILS 2 TIMES DAILY. 12/08/19   Dara Hoyer, FNP  Cholecalciferol (VITAMIN D3) 1.25 MG (50000 UT) CAPS Take 50,000 Units by mouth once a week. On Sunday Patient not taking: Reported on 07/01/2020 12/07/17   [provider]  diclofenac sodium (VOLTAREN) 1 % GEL Apply 2 g topically 4 (four) times daily as needed (knee pain).  [provider]  DULoxetine (CYMBALTA) 60 MG capsule Take 60 mg by mouth daily.    [provider]  EPINEPHrine (AUVI-Q) 0.3 mg/0.3 mL IJ SOAJ injection Inject 0.3 mg into the muscle as needed for anaphylaxis. 01/30/20   Valentina Shaggy, MD  ipratropium (ATROVENT) 0.06 % nasal spray Place 2 sprays into both nostrils 4 (four) times daily. 01/30/20   Valentina Shaggy, MD  leflunomide (ARAVA) 10 MG tablet Take 10 mg by mouth daily.    [provider]  loratadine (CLARITIN) 10 MG tablet Take 10 mg by mouth daily.    [provider]  Magnesium-Zinc (MAGNESIUM-CHELATED ZINC PO) Take 1 tablet by mouth daily.    [provider]    Allergies Patient has no known allergies.   REVIEW OF SYSTEMS  Negative except as noted here or in the History  of Present Illness.   PHYSICAL EXAMINATION  Initial Vital Signs Blood pressure (!) 167/78, pulse (!) 56, temperature 97.9 F (36.6 C), temperature source Oral, resp. rate 16, height '5\' 5"'$  (1.651 m), weight 57.2 kg, SpO2 99 %.  Examination General: Well-developed, well-nourished female in no acute distress; appearance consistent with age of record HENT: normocephalic; atraumatic Eyes: pupils equal, round and reactive to light; extraocular muscles intact Neck: supple Heart: regular rate and rhythm; no murmurs, rubs or gallops Lungs: clear to auscultation bilaterally Abdomen: soft; nondistended; nontender; no masses or hepatosplenomegaly; bowel sounds present Extremities: No deformity; full range of motion; pulses normal Neurologic: Awake, alert and oriented; motor function intact in all extremities and symmetric; no facial droop Skin: Warm and dry Psychiatric: Normal mood and affect   RESULTS  Summary of this visit's results, reviewed and interpreted by myself:   EKG Interpretation  Date/Time:    Ventricular Rate:    PR Interval:    QRS Duration:   QT Interval:    QTC Calculation:   R Axis:     Text Interpretation:         Laboratory Studies: Results for orders placed or performed during the hospital encounter of 08/31/20 (from the past 24 hour(s))  Lipase, blood     Status: None   Collection Time: 08/31/20  2:02 AM  Result Value Ref Range   Lipase 16 11 - 51 U/L  Comprehensive metabolic panel     Status: Abnormal   Collection Time: 08/31/20  2:02 AM  Result Value Ref Range   Sodium 140 135 - 145 mmol/L   Potassium 3.5 3.5 - 5.1 mmol/L   Chloride 101 98 - 111 mmol/L   CO2 28 22 - 32 mmol/L   Glucose, Bld 100 (H) 70 - 99 mg/dL   BUN 26 (H) 8 - 23 mg/dL   Creatinine, Ser 0.77 0.44 - 1.00 mg/dL   Calcium 10.2 8.9 - 10.3 mg/dL   Total Protein 7.0 6.5 - 8.1 g/dL   Albumin 4.6 3.5 - 5.0 g/dL   AST 17 15 - 41 U/L   ALT 13 0 - 44 U/L   Alkaline Phosphatase 70 38 -  126 U/L   Total Bilirubin 0.7 0.3 - 1.2 mg/dL   GFR, Estimated >60 >60 mL/min   Anion gap 11 5 - 15  CBC     Status: None   Collection Time: 08/31/20  2:02 AM  Result Value Ref Range   WBC 9.5 4.0 - 10.5 K/uL   RBC 4.87 3.87 - 5.11 MIL/uL   Hemoglobin 14.4 12.0 - 15.0 g/dL   HCT 43.9 36.0 -  46.0 %   MCV 90.1 80.0 - 100.0 fL   MCH 29.6 26.0 - 34.0 pg   MCHC 32.8 30.0 - 36.0 g/dL   RDW 13.5 11.5 - 15.5 %   Platelets 163 150 - 400 K/uL   nRBC 0.0 0.0 - 0.2 %   Imaging Studies: CT ABDOMEN PELVIS W CONTRAST  Result Date: 08/31/2020 CLINICAL DATA:  Concern for bowel obstruction. EXAM: CT ABDOMEN AND PELVIS WITH CONTRAST TECHNIQUE: Multidetector CT imaging of the abdomen and pelvis was performed using the standard protocol following bolus administration of intravenous contrast. CONTRAST:  67m OMNIPAQUE IOHEXOL 350 MG/ML SOLN COMPARISON:  CT abdomen pelvis dated 03/21/2019. FINDINGS: Lower chest: The visualized lung bases are clear. No intra-abdominal free air.  Small free fluid within the pelvis. Hepatobiliary: The liver is unremarkable. There is mild intrahepatic biliary ductal dilatation, post cholecystectomy. No retained calcified stone noted in the central CBD. Pancreas: Unremarkable. No pancreatic ductal dilatation or surrounding inflammatory changes. Spleen: Normal in size without focal abnormality. Adrenals/Urinary Tract: The adrenal glands unremarkable. The kidneys, visualized ureters, and urinary bladder appear unremarkable. Stomach/Bowel: There is postsurgical changes of small bowel with anastomotic suture in the lower abdomen. Multiple dilated and fluid-filled loops of small bowel in the mid to lower abdomen measure up to 4 cm in caliber. There is abutment of these loops of small bowel to the anterior peritoneal wall consistent with peritoneal adhesions. The distal small bowel and terminal ileum demonstrate a normal caliber. Findings consistent with small-bowel obstruction, likely related  to adhesions with the transition point likely in the midline along the peritoneal adhesion (50/2 and 60/7). Surgical sutures also noted in the region of the sigmoid. There is large amount of stool throughout the colon. Vascular/Lymphatic: The abdominal aorta and IVC unremarkable. No pain venous gas. There is no adenopathy. Reproductive: Hysterectomy. Other: Midline vertical anterior pelvic wall incisional scar. Musculoskeletal: Degenerative changes of the spine. No acute osseous pathology. IMPRESSION: 1. Small-bowel obstruction, likely related to peritoneal adhesions with the transition point likely in the midline along the peritoneal adhesion. 2. Large amount of stool throughout the colon. Electronically Signed   By: AAnner CreteM.D.   On: 08/31/2020 03:48    ED COURSE and MDM  Nursing notes, initial and subsequent vitals signs, including pulse oximetry, reviewed and interpreted by myself.  Vitals:   08/31/20 0159 08/31/20 0200 08/31/20 0300  BP:  (!) 167/78 138/72  Pulse:  (!) 56 (!) 58  Resp:  16 18  Temp:  97.9 F (36.6 C)   TempSrc:  Oral   SpO2:  99% 96%  Weight: 57.2 kg    Height: '5\' 5"'$  (1.651 m)     Medications  0.9 %  sodium chloride infusion ( Intravenous New Bag/Given 08/31/20 0223)  ondansetron (ZOFRAN) injection 4 mg (4 mg Intravenous Given 08/31/20 0223)  fentaNYL (SUBLIMAZE) injection 50 mcg (50 mcg Intravenous Given 08/31/20 0223)  iohexol (OMNIPAQUE) 350 MG/ML injection 75 mL (75 mLs Intravenous Contrast Given 08/31/20 0312)  fentaNYL (SUBLIMAZE) injection 50 mcg (50 mcg Intravenous Given 08/31/20 0415)  0.9 %  sodium chloride infusion ( Intravenous New Bag/Given 08/31/20 0415)   4:03 AM NG tube ordered for small bowel obstruction. Distended, full stomach puts her at risk of aspiration.  4:18 AM Dr. OMyna Hidalgoaccepts for admission to hospitalist service.   PROCEDURES  Procedures   ED DIAGNOSES     ICD-10-CM   1. Small bowel obstruction due to adhesions (HCC)  K56.50  Antanasia Kaczynski, Jenny Reichmann, MD 08/31/20 [phone]

## 2020-08-31 NOTE — ED Triage Notes (Signed)
Abd pain with vomiting since yesterday evening.  Reports hx of SBO. No BM since Thursday.

## 2020-08-31 NOTE — ED Notes (Signed)
Report called to Alphonzo Lemmings, RN at [facility].

## 2020-08-31 NOTE — Consult Note (Signed)
CC: nausea/vomiting/abd pain  Requesting provider: Dr Nicoletta Dress  HPI: Emily Wolfe is an 65 y.o. female who is here for evaluation of nausea, vomiting, abdominal pain that started last night after dinner.  She denies any fevers or chills.  She had a bowel movement yesterday and she has had a large bowel movement since being here.  She has had 1 admission for a small bowel obstruction since her major surgery in 2019.  It resolved very quickly.  She has intentionally lost about 75 pounds over the past year.  She has been working with obesity medicine.  She is very mindful of her food choices.  She had ovarian cancer which was treated surgically in 2009.  She states a few days after surgery her stomach exploded and she had to have emergency surgery.  It sounds like she underwent 2 subsequent hernia repairs.  After that she had chronic right-sided abdominal pain.  It took her several years to find a Psychologist, sport and exercise who would be willing to do exploratory surgery on her.  She underwent planned laparotomy in 2019.  Since then she has not had the severe right-sided abdominal pain.    Outside op note 2019- Findings: Extensive adhesions involving the entire abdomin with small bowel, omentum, colon. Most of the jejunum is adherent to her previously inserted multiple mesh repairs, adhesions in the pelvis. At least 3 different kinds of synthetic mesh found. Distorted anatomy with difficult dissection.  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   Mild  intermittent asthma without complication 0000000   Multiple sclerosis ([place]) 06/29/2017   Osteoarthritis    Palpitations    Postmenopausal 06/29/2017   Scoliosis of lumbar spine 06/29/2017   Small bowel obstruction ([place]) 06/29/2017   Swallowing difficulty    Urticaria    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   Vitamin D deficiency 06/29/2017    Past Surgical History:  Procedure Laterality Date   ADENOIDECTOMY     CHOLECYSTECTOMY  2009   HERNIA REPAIR     HERNIA REPAIR     6/10, 9/10   HYSTERECTOMY ABDOMINAL WITH SALPINGO-OOPHORECTOMY     HYSTEROTOMY  2008   LAPAROSCOPIC LYSIS OF ADHESIONS     , With small bowel resection and ventral hernia repair with mesh 2019/[place]   MOHS SURGERY     SINOSCOPY     TONSILLECTOMY     TYMPANOSTOMY TUBE PLACEMENT     VARICOSE VEIN SURGERY      Family History  Problem Relation Age of Onset   Breast cancer Mother    Cancer Mother    Cervical cancer Maternal Grandmother     Social:  reports that she has never smoked. She has never used smokeless tobacco. She reports that she does not currently use alcohol. She reports that she does not currently use drugs.  Allergies: No Known Allergies  Medications: I have reviewed the patient's current medications.  ROS - all of the below systems have been reviewed with the patient and positives are indicated with bold text General: chills, fever or night sweats Eyes: blurry vision or double vision ENT: epistaxis or sore throat Allergy/Immunology: itchy/watery eyes or nasal congestion Hematologic/Lymphatic: bleeding problems, blood clots or swollen lymph nodes Endocrine: temperature intolerance or unexpected weight changes Breast: new or changing breast lumps or nipple discharge Resp: cough, shortness of breath, or wheezing CV: chest pain or dyspnea on exertion GI: as per HPI GU: dysuria, trouble voiding, or hematuria MSK: joint pain or joint stiffness Neuro: TIA or stroke  symptoms Derm: pruritus and skin lesion changes Psych: anxiety and depression  PE Blood pressure (!) 144/85, pulse 66, temperature 97.7 F (36.5 C), temperature source Oral, resp. rate 18, height '5\' 5"'$  (1.651 m), weight 57.2 kg, SpO2 100 %. Constitutional: NAD; conversant; no deformities Eyes: Moist conjunctiva; no lid lag; anicteric; PERRL Neck: Trachea midline; no thyromegaly Lungs: Normal respiratory effort; no tactile fremitus CV: RRR; no palpable thrills; no pitting edema GI: Abd multiple old abdominal incisions; soft, not really distended; essentially nontender; no palpable hepatosplenomegaly MSK: Normal gait; no clubbing/cyanosis Psychiatric: Appropriate affect; alert and oriented x3 Lymphatic: No palpable cervical or axillary lymphadenopathy Skin: No rash, jaundice or edema  Results for orders placed or performed during the hospital encounter of 08/31/20 (from the past 48 hour(s))  Lipase, blood     Status: None   Collection Time: 08/31/20  2:02 AM  Result Value Ref Range   Lipase 16 11 - 51 U/L    Comment: Performed at [facility], [place], [place], [place] [address]  Comprehensive metabolic panel     Status: Abnormal   Collection Time: 08/31/20  2:02 AM  Result Value Ref Range   Sodium 140 135 - 145 mmol/L   Potassium 3.5 3.5 - 5.1 mmol/L   Chloride 101 98 - 111 mmol/L   CO2 28 22 - 32 mmol/L   Glucose, Bld 100 (H) 70 - 99 mg/dL    Comment: Glucose reference range applies only to samples taken after fasting for at least 8 hours.   BUN 26 (H) 8 - 23 mg/dL   Creatinine, Ser 0.77 0.44 - 1.00 mg/dL   Calcium 10.2 8.9 - 10.3 mg/dL   Total Protein 7.0 6.5 - 8.1 g/dL   Albumin 4.6 3.5 - 5.0 g/dL   AST 17 15 - 41 U/L   ALT 13 0 - 44 U/L   Alkaline Phosphatase 70 38 - 126 U/L   Total Bilirubin 0.7 0.3 - 1.2 mg/dL   GFR, Estimated >60 >60 mL/min    Comment: (NOTE) Calculated using the CKD-EPI Creatinine Equation (2021)    Anion gap 11 5 - 15     Comment: Performed at [facility], [address], [place],  [address]  CBC     Status: None   Collection Time: 08/31/20  2:02 AM  Result Value Ref Range   WBC 9.5 4.0 - 10.5 K/uL   RBC 4.87 3.87 - 5.11 MIL/uL   Hemoglobin 14.4 12.0 - 15.0 g/dL   HCT 43.9 36.0 - 46.0 %   MCV 90.1 80.0 - 100.0 fL   MCH 29.6 26.0 - 34.0 pg   MCHC 32.8 30.0 - 36.0 g/dL   RDW 13.5 11.5 - 15.5 %   Platelets 163 150 - 400 K/uL   nRBC 0.0 0.0 - 0.2 %    Comment: Performed at [facility], [address]  [place], [place], [place] [address]  Resp Panel by RT-PCR (Flu A&B, Covid) Nasopharyngeal Swab     Status: None   Collection Time: 08/31/20  4:02 AM   Specimen: Nasopharyngeal Swab; Nasopharyngeal(NP) swabs in vial transport medium  Result Value Ref Range   SARS Coronavirus 2 by RT PCR NEGATIVE NEGATIVE    Comment: (NOTE) SARS-CoV-2 target nucleic acids are NOT DETECTED.  The SARS-CoV-2 RNA is generally detectable in upper respiratory specimens during the acute phase of infection. The lowest concentration of SARS-CoV-2 viral copies this assay can detect is 138 copies/mL. A negative result does not preclude SARS-Cov-2 infection and should not be used as the sole basis for treatment or other patient management decisions. A negative result may occur with  improper specimen collection/handling, submission of specimen other than nasopharyngeal swab, presence of viral mutation(s) within the areas targeted by this assay, and inadequate number of viral copies(<138 copies/mL). A negative result must be combined with clinical observations, patient history, and epidemiological information. The expected result is Negative.  Fact Sheet for Patients:  EntrepreneurPulse.com.au  Fact Sheet for Healthcare Providers:  IncredibleEmployment.be  This test is no t yet approved or cleared by the [place] FDA and  has been authorized for  detection and/or diagnosis of SARS-CoV-2 by FDA under an Emergency Use Authorization (EUA). This EUA will remain  in effect (meaning this test can be used) for the duration of the COVID-19 declaration under Section 564(b)(1) of the Act, 21 U.S.C.section 360bbb-3(b)(1), unless the authorization is terminated  or revoked sooner.       Influenza A by PCR NEGATIVE NEGATIVE   Influenza B by PCR NEGATIVE NEGATIVE    Comment: (NOTE) The Xpert Xpress SARS-CoV-2/FLU/RSV plus assay is intended as an aid in the diagnosis of influenza from Nasopharyngeal swab specimens and should not be used as a sole basis for treatment. Nasal washings and aspirates are unacceptable for Xpert Xpress SARS-CoV-2/FLU/RSV testing.  Fact Sheet for Patients: EntrepreneurPulse.com.au  Fact Sheet for Healthcare Providers: IncredibleEmployment.be  This test is not yet approved or cleared by the [place] FDA and has been authorized for detection and/or diagnosis of SARS-CoV-2 by FDA under an Emergency Use Authorization (EUA). This EUA will remain in effect (meaning this test can be used) for the duration of the COVID-19 declaration under Section 564(b)(1) of the Act, 21 U.S.C. section 360bbb-3(b)(1), unless the authorization is terminated or revoked.  Performed at [facility], [address], [place],  [address]   Glucose, capillary     Status: None   Collection Time: 08/31/20 11:45 AM  Result Value Ref Range   Glucose-Capillary 90 70 - 99 mg/dL    Comment: Glucose reference range applies only to samples taken after fasting for at least 8 hours.    DG Chest 1 View  Result Date: 08/31/2020 CLINICAL DATA:  Evaluate NG tube placement EXAM: CHEST  1 VIEW COMPARISON:  Earlier today FINDINGS: The nasogastric tube tip is just below the level of the GE junction by approximately 1.9 cm with side port above the GE junction. Recommend advancing NG tube  by at least 10 cm. IMPRESSION: Under advanced NG tube with side port above GE junction. Recommend advancing by approximately 10 cm. Electronically Signed   By: Kerby Moors M.D.   On: 08/31/2020 05:40   CT ABDOMEN PELVIS W CONTRAST  Result Date: 08/31/2020 CLINICAL DATA:  Concern for bowel obstruction. EXAM: CT ABDOMEN AND PELVIS WITH CONTRAST TECHNIQUE: Multidetector CT imaging of the abdomen and pelvis  was performed using the standard protocol following bolus administration of intravenous contrast. CONTRAST:  79m OMNIPAQUE IOHEXOL 350 MG/ML SOLN COMPARISON:  CT abdomen pelvis dated 03/21/2019. FINDINGS: Lower chest: The visualized lung bases are clear. No intra-abdominal free air.  Small free fluid within the pelvis. Hepatobiliary: The liver is unremarkable. There is mild intrahepatic biliary ductal dilatation, post cholecystectomy. No retained calcified stone noted in the central CBD. Pancreas: Unremarkable. No pancreatic ductal dilatation or surrounding inflammatory changes. Spleen: Normal in size without focal abnormality. Adrenals/Urinary Tract: The adrenal glands unremarkable. The kidneys, visualized ureters, and urinary bladder appear unremarkable. Stomach/Bowel: There is postsurgical changes of small bowel with anastomotic suture in the lower abdomen. Multiple dilated and fluid-filled loops of small bowel in the mid to lower abdomen measure up to 4 cm in caliber. There is abutment of these loops of small bowel to the anterior peritoneal wall consistent with peritoneal adhesions. The distal small bowel and terminal ileum demonstrate a normal caliber. Findings consistent with small-bowel obstruction, likely related to adhesions with the transition point likely in the midline along the peritoneal adhesion (50/2 and 60/7). Surgical sutures also noted in the region of the sigmoid. There is large amount of stool throughout the colon. Vascular/Lymphatic: The abdominal aorta and IVC unremarkable. No pain  venous gas. There is no adenopathy. Reproductive: Hysterectomy. Other: Midline vertical anterior pelvic wall incisional scar. Musculoskeletal: Degenerative changes of the spine. No acute osseous pathology. IMPRESSION: 1. Small-bowel obstruction, likely related to peritoneal adhesions with the transition point likely in the midline along the peritoneal adhesion. 2. Large amount of stool throughout the colon. Electronically Signed   By: AAnner CreteM.D.   On: 08/31/2020 03:48    Imaging: Personally reviewed  A/P: CYasenia Rizzutois an 65y.o. female with  pSBO Large stool burden H/o multiple prior abdominal surgeries HTN HLD Chronic pain H/o ovarian cancer MS  No indications for urgent laparotomy.  Patient is resting comfortably.  She is afebrile.  She is nontoxic.  No leukocytosis.  Small bowel structure likely due to adhesions from multiple prior abdominal surgeries.  No she has had a bowel movement I think she is evacuating her colon  Will do small bowel obstruction protocol which was explained to the patient.  Will follow  ELeighton Ruff WRedmond Pulling MD, FACS General, Bariatric, & Minimally Invasive Surgery [facility] [place]

## 2020-08-31 NOTE — H&P (Signed)
History and Physical    Emily Wolfe [id number] DOB: [birth year] DOA: 08/31/2020  PCP: Vivi Barrack, MD Patient coming from: Home  Chief Complaint: Nausea, vomiting and abdominal pain  HPI: Emily Wolfe is a 65 y.o. female with medical history significant of multiple abdominal surgeries with last surgery in 2019, HTN, HLD, chronic pain, history of ovarian cancer, multiple sclerosis, who presented with nausea, vomiting and abdominal pain.  Patient reports that she started to have acute onset of nausea, vomiting and abdominal pain sometime after dinner last night.  Her abdomen pain was located to mid abdomen, intermittent, aching pain, moderate to severe pain with no radiation.  Denies fevers, chills, shortness of breath, cough, wheezing, chest pain, diarrhea, dysuria, urinary frequency or urgency.  Patient came to the emergency room earlier this morning.  In the emergency room, she was afebrile with pulse 60s, RR 16, BP 167/78 and room air O2 sats 99%.  The labs showed nonrevealing CMP and a CBC, normal lipase, negative COVID.  CT abdomen showed Small-bowel obstruction, likely related to peritoneal adhesions with the transition point likely in the midline along the peritoneal adhesion. Large amount of stool throughout the colon.  NG tube to wall suction was started, and the patient was transferred to [facility] long for further management.   Review of Systems: As per HPI otherwise 10 point review of systems negative.  Review of Systems Otherwise negative except as per HPI, including: General: Denies fever, chills, night sweats or unintended weight loss. Resp: Denies cough, wheezing, shortness of breath. Cardiac: Denies chest pain, palpitations, orthopnea, paroxysmal nocturnal dyspnea. GI: Positive for abdominal pain, nausea, vomiting.  Negative for diarrhea or constipation GU: Denies dysuria, frequency, hesitancy or incontinence MS: Denies muscle aches, joint pain or swelling Neuro:  Denies headache, neurologic deficits (focal weakness, numbness, tingling), abnormal gait Psych: Denies anxiety, depression, SI/HI/AVH Skin: Denies new rashes or lesions ID: Denies sick contacts, exotic exposures, travel  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   History of ovarian cancer 11/19/2017   History of stomach ulcers    Hyperlipidemia 06/29/2017   Hypertension 06/29/2017   Joint pain    Lactose intolerance    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   Mild intermittent asthma without complication 0000000   Multiple sclerosis ([place]) 06/29/2017   Osteoarthritis    Palpitations    Postmenopausal 06/29/2017   Scoliosis of lumbar spine 06/29/2017   Small bowel obstruction ([place]) 06/29/2017   Swallowing difficulty    Urticaria    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   Vitamin D deficiency 06/29/2017    Past Surgical History:  Procedure Laterality Date   ADENOIDECTOMY     CHOLECYSTECTOMY  2009   HERNIA REPAIR     HERNIA REPAIR     6/10, 9/10   HYSTERECTOMY ABDOMINAL WITH SALPINGO-OOPHORECTOMY     HYSTEROTOMY  2008   LAPAROSCOPIC LYSIS OF ADHESIONS     , With small bowel resection and ventral hernia repair with mesh 2019/[place]   MOHS SURGERY     SINOSCOPY     TONSILLECTOMY     TYMPANOSTOMY TUBE PLACEMENT  VARICOSE VEIN SURGERY      SOCIAL HISTORY:  reports that she has never smoked. She has never used smokeless tobacco. She reports that she does not currently use alcohol. She reports that she does not currently use drugs.  No Known Allergies  FAMILY HISTORY: Family History  Problem Relation Age of Onset   Breast cancer Mother    Cancer Mother    Cervical cancer Maternal  Grandmother      Prior to Admission medications   Medication Sig Start Date End Date Taking? Authorizing Provider  azelastine (ASTELIN) 0.1 % nasal spray PLACE 2 SPRAYS INTO BOTH NOSTRILS 2 TIMES DAILY. 12/08/19   Dara Hoyer, FNP  Cholecalciferol (VITAMIN D3) 1.25 MG (50000 UT) CAPS Take 50,000 Units by mouth once a week. On Sunday Patient not taking: Reported on 07/01/2020 12/07/17   [provider]  diclofenac sodium (VOLTAREN) 1 % GEL Apply 2 g topically 4 (four) times daily as needed (knee pain).     [provider]  DULoxetine (CYMBALTA) 60 MG capsule Take 60 mg by mouth daily.    [provider]  EPINEPHrine (AUVI-Q) 0.3 mg/0.3 mL IJ SOAJ injection Inject 0.3 mg into the muscle as needed for anaphylaxis. 01/30/20   Valentina Shaggy, MD  ipratropium (ATROVENT) 0.06 % nasal spray Place 2 sprays into both nostrils 4 (four) times daily. 01/30/20   Valentina Shaggy, MD  leflunomide (ARAVA) 10 MG tablet Take 10 mg by mouth daily.    [provider]  loratadine (CLARITIN) 10 MG tablet Take 10 mg by mouth daily.    [provider]  Magnesium-Zinc (MAGNESIUM-CHELATED ZINC PO) Take 1 tablet by mouth daily.    [provider]    Physical Exam: Vitals:   08/31/20 0700 08/31/20 0745 08/31/20 0924 08/31/20 1328  BP: (!) 136/91 (!) 153/85 133/66 135/73  Pulse: 61 71 63 64  Resp:  20 18   Temp:  98 F (36.7 C) 98.3 F (36.8 C) 98.2 F (36.8 C)  TempSrc:  Oral Oral Oral  SpO2: 98% 93% 98% 100%  Weight:      Height:          Constitutional: NAD, calm, comfortable.  Acute ill-appearing. Eyes: PERRL, lids and conjunctivae normal ENMT: Mucous membranes are dry posterior pharynx clear of any exudate or lesions.Normal dentition.  Neck: normal, supple, no masses, no thyromegaly Respiratory: clear to auscultation bilaterally, no wheezing, no crackles. Normal respiratory effort. No accessory muscle use.  Cardiovascular: Regular rate  and rhythm, no murmurs / rubs / gallops. No extremity edema. 2+ pedal pulses. No carotid bruits.  Abdomen: Abdomen soft.  Hypoactive bowel sounds.  Mild to moderate tenderness to mid abdomen area without guarding or rebound tenderness.  No masses palpated. No hepatosplenomegaly. Bowel sounds positive.  Musculoskeletal: no clubbing / cyanosis. No joint deformity upper and lower extremities. Good ROM, no contractures. Normal muscle tone.  Skin: no rashes, lesions, ulcers. No induration Neurologic: CN 2-12 grossly intact. Sensation intact, DTR normal. Strength 5/5 in all 4.  Psychiatric: Normal judgment and insight. Alert and oriented x 3. Normal mood.     Labs on Admission: I have personally reviewed following labs and imaging studies  CBC: Recent Labs  Lab 08/31/20 0202  WBC 9.5  HGB 14.4  HCT 43.9  MCV 90.1  PLT [id number]   Basic Metabolic Panel: Recent Labs  Lab 08/31/20 0202  Daiquan Resnik 140  K 3.5  CL 101  CO2 28  GLUCOSE 100*  BUN 26*  CREATININE 0.77  CALCIUM 10.2   GFR: Estimated Creatinine Clearance: 63.1 mL/min (by C-G formula based on SCr of 0.77 mg/dL). Liver Function Tests: Recent Labs  Lab 08/31/20 0202  AST 17  ALT 13  ALKPHOS 70  BILITOT 0.7  PROT 7.0  ALBUMIN 4.6   Recent Labs  Lab 08/31/20 0202  LIPASE 16   No results for input(s): AMMONIA in the last 168 hours. Coagulation Profile: No results for input(s): INR, PROTIME in the last 168 hours. Cardiac Enzymes: No results for input(s): CKTOTAL, CKMB, CKMBINDEX, TROPONINI in the last 168 hours. BNP (last 3 results) No results for input(s): PROBNP in the last 8760 hours. HbA1C: No results for input(s): HGBA1C in the last 72 hours. CBG: Recent Labs  Lab 08/31/20 1145  GLUCAP 90   Lipid Profile: No results for input(s): CHOL, HDL, LDLCALC, TRIG, CHOLHDL, LDLDIRECT in the last 72 hours. Thyroid Function Tests: No results for input(s): TSH, T4TOTAL, FREET4, T3FREE, THYROIDAB in the last 72 hours. Anemia  Panel: No results for input(s): VITAMINB12, FOLATE, FERRITIN, TIBC, IRON, RETICCTPCT in the last 72 hours. Urine analysis:    Component Value Date/Time   COLORURINE YELLOW 03/21/2019 0133   APPEARANCEUR HAZY (A) 03/21/2019 0133   LABSPEC 1.018 03/21/2019 0133   PHURINE 8.0 03/21/2019 0133   GLUCOSEU NEGATIVE 03/21/2019 0133   HGBUR NEGATIVE 03/21/2019 0133   BILIRUBINUR NEGATIVE 03/21/2019 0133   KETONESUR 20 (A) 03/21/2019 0133   PROTEINUR 30 (A) 03/21/2019 0133   NITRITE NEGATIVE 03/21/2019 0133   LEUKOCYTESUR NEGATIVE 03/21/2019 0133   Sepsis Labs: !!!!!!!!!!!!!!!!!!!!!!!!!!!!!!!!!!!!!!!!!!!! '@LABRCNTIP'$ (procalcitonin:4,lacticidven:4) ) Recent Results (from the past 240 hour(s))  Resp Panel by RT-PCR (Flu A&B, Covid) Nasopharyngeal Swab     Status: None   Collection Time: 08/31/20  4:02 AM   Specimen: Nasopharyngeal Swab; Nasopharyngeal(NP) swabs in vial transport medium  Result Value Ref Range Status   SARS Coronavirus 2 by RT PCR NEGATIVE NEGATIVE Final    Comment: (NOTE) SARS-CoV-2 target nucleic acids are NOT DETECTED.  The SARS-CoV-2 RNA is generally detectable in upper respiratory specimens during the acute phase of infection. The lowest concentration of SARS-CoV-2 viral copies this assay can detect is 138 copies/mL. A negative result does not preclude SARS-Cov-2 infection and should not be used as the sole basis for treatment or other patient management decisions. A negative result may occur with  improper specimen collection/handling, submission of specimen other than nasopharyngeal swab, presence of viral mutation(s) within the areas targeted by this assay, and inadequate number of viral copies(<138 copies/mL). A negative result must be combined with clinical observations, patient history, and epidemiological information. The expected result is Negative.  Fact Sheet for Patients:  EntrepreneurPulse.com.au  Fact Sheet for Healthcare Providers:   IncredibleEmployment.be  This test is no t yet approved or cleared by the [place] FDA and  has been authorized for detection and/or diagnosis of SARS-CoV-2 by FDA under an Emergency Use Authorization (EUA). This EUA will remain  in effect (meaning this test can be used) for the duration of the COVID-19 declaration under Section 564(b)(1) of the Act, 21 U.S.C.section 360bbb-3(b)(1), unless the authorization is terminated  or revoked sooner.       Influenza A by PCR NEGATIVE NEGATIVE Final   Influenza B by PCR NEGATIVE NEGATIVE Final    Comment: (NOTE) The Xpert Xpress SARS-CoV-2/FLU/RSV plus assay is intended as an aid in the diagnosis of influenza from Nasopharyngeal swab specimens and should not be used as a sole basis  for treatment. Nasal washings and aspirates are unacceptable for Xpert Xpress SARS-CoV-2/FLU/RSV testing.  Fact Sheet for Patients: EntrepreneurPulse.com.au  Fact Sheet for Healthcare Providers: IncredibleEmployment.be  This test is not yet approved or cleared by the [place] FDA and has been authorized for detection and/or diagnosis of SARS-CoV-2 by FDA under an Emergency Use Authorization (EUA). This EUA will remain in effect (meaning this test can be used) for the duration of the COVID-19 declaration under Section 564(b)(1) of the Act, 21 U.S.C. section 360bbb-3(b)(1), unless the authorization is terminated or revoked.  Performed at [facility], [address], [place], [place] [address]      Radiological Exams on Admission: DG Chest 1 View  Result Date: 08/31/2020 CLINICAL DATA:  Evaluate NG tube placement EXAM: CHEST  1 VIEW COMPARISON:  Earlier today FINDINGS: The nasogastric tube tip is just below the level of the GE junction by approximately 1.9 cm with side port above the GE junction. Recommend advancing NG tube by at least 10 cm. IMPRESSION: Under advanced NG  tube with side port above GE junction. Recommend advancing by approximately 10 cm. Electronically Signed   By: Kerby Moors M.D.   On: 08/31/2020 05:40   CT ABDOMEN PELVIS W CONTRAST  Result Date: 08/31/2020 CLINICAL DATA:  Concern for bowel obstruction. EXAM: CT ABDOMEN AND PELVIS WITH CONTRAST TECHNIQUE: Multidetector CT imaging of the abdomen and pelvis was performed using the standard protocol following bolus administration of intravenous contrast. CONTRAST:  82m OMNIPAQUE IOHEXOL 350 MG/ML SOLN COMPARISON:  CT abdomen pelvis dated 03/21/2019. FINDINGS: Lower chest: The visualized lung bases are clear. No intra-abdominal free air.  Small free fluid within the pelvis. Hepatobiliary: The liver is unremarkable. There is mild intrahepatic biliary ductal dilatation, post cholecystectomy. No retained calcified stone noted in the central CBD. Pancreas: Unremarkable. No pancreatic ductal dilatation or surrounding inflammatory changes. Spleen: Normal in size without focal abnormality. Adrenals/Urinary Tract: The adrenal glands unremarkable. The kidneys, visualized ureters, and urinary bladder appear unremarkable. Stomach/Bowel: There is postsurgical changes of small bowel with anastomotic suture in the lower abdomen. Multiple dilated and fluid-filled loops of small bowel in the mid to lower abdomen measure up to 4 cm in caliber. There is abutment of these loops of small bowel to the anterior peritoneal wall consistent with peritoneal adhesions. The distal small bowel and terminal ileum demonstrate a normal caliber. Findings consistent with small-bowel obstruction, likely related to adhesions with the transition point likely in the midline along the peritoneal adhesion (50/2 and 60/7). Surgical sutures also noted in the region of the sigmoid. There is large amount of stool throughout the colon. Vascular/Lymphatic: The abdominal aorta and IVC unremarkable. No pain venous gas. There is no adenopathy. Reproductive:  Hysterectomy. Other: Midline vertical anterior pelvic wall incisional scar. Musculoskeletal: Degenerative changes of the spine. No acute osseous pathology. IMPRESSION: 1. Small-bowel obstruction, likely related to peritoneal adhesions with the transition point likely in the midline along the peritoneal adhesion. 2. Large amount of stool throughout the colon. Electronically Signed   By: AAnner CreteM.D.   On: 08/31/2020 03:48     All images have been reviewed by me personally.    Assessment/Plan Principal Problem:   SBO (small bowel obstruction) (HCC) Active Problems:   Hyperlipidemia   Essential hypertension   MS (multiple sclerosis) (HCC)   Anxiety   Dehydration    Assessment Plan  #Acute small bowel obstruction #History of multiple abdominal surgeries  Patient presented with nausea, vomiting and abdominal pain.  Abdominal CT suggested SBO likely related to peritoneal adhesions with transition point. -Admit -N.p.o. -IV fluids for hydration -NG to wall suction -Antiemesis in the pain management as needed -General surgery consult was called   #Mild dehydration -IV fluids   #HTN and HLD -Chronic and stable  #Chronic pain -Chronic and stable  #Multiple sclerosis -Chronic and stable          Nutritional status  Body mass index is 20.97 kg/m.        DVT prophylaxis: Lovenox subcu Code Status: DNR/DNI Family Communication: None at bedside Consults called: General surgery Admission status:   Status is: Inpatient  Remains inpatient appropriate because:Ongoing active pain requiring inpatient pain management  Dispo: The patient is from: Home              Anticipated d/c is to: Home              Patient currently is not medically stable to d/c.   Difficult to place patient No       Time Spent: 65 minutes.  >50% of the time was devoted to discussing the patients care, assessment, plan and disposition with other care givers along with counseling the patient  about the risks and benefits of treatment.    Charlann Lange MD [facility]  If 7PM-7AM, please contact night-coverage   08/31/2020, 3:47 PM

## 2020-09-01 ENCOUNTER — Inpatient Hospital Stay (HOSPITAL_COMMUNITY): Payer: Medicare Other

## 2020-09-01 DIAGNOSIS — F419 Anxiety disorder, unspecified: Secondary | ICD-10-CM

## 2020-09-01 LAB — GLUCOSE, CAPILLARY
Glucose-Capillary: 79 mg/dL (ref 70–99)
Glucose-Capillary: 86 mg/dL (ref 70–99)
Glucose-Capillary: 89 mg/dL (ref 70–99)
Glucose-Capillary: 95 mg/dL (ref 70–99)

## 2020-09-01 LAB — BASIC METABOLIC PANEL
Anion gap: 4 — ABNORMAL LOW (ref 5–15)
BUN: 17 mg/dL (ref 8–23)
CO2: 27 mmol/L (ref 22–32)
Calcium: 8.5 mg/dL — ABNORMAL LOW (ref 8.9–10.3)
Chloride: 111 mmol/L (ref 98–111)
Creatinine, Ser: 0.69 mg/dL (ref 0.44–1.00)
GFR, Estimated: >60 mL/min (ref >60)
Glucose, Bld: 90 mg/dL (ref 70–99)
Potassium: 3.8 mmol/L (ref 3.5–5.1)
Sodium: 142 mmol/L (ref 135–145)

## 2020-09-01 LAB — HIV ANTIBODY (ROUTINE TESTING W REFLEX): HIV Screen 4th Generation wRfx: NONREACTIVE

## 2020-09-01 IMAGING — DX DG ABD PORTABLE 1V
1 series · 1 of 1 positions shown · non-contrast
Comparison: [DATE].

CLINICAL DATA: Small bowel obstruction.

EXAM:
PORTABLE ABDOMEN - 1 VIEW

[abdomen kub]
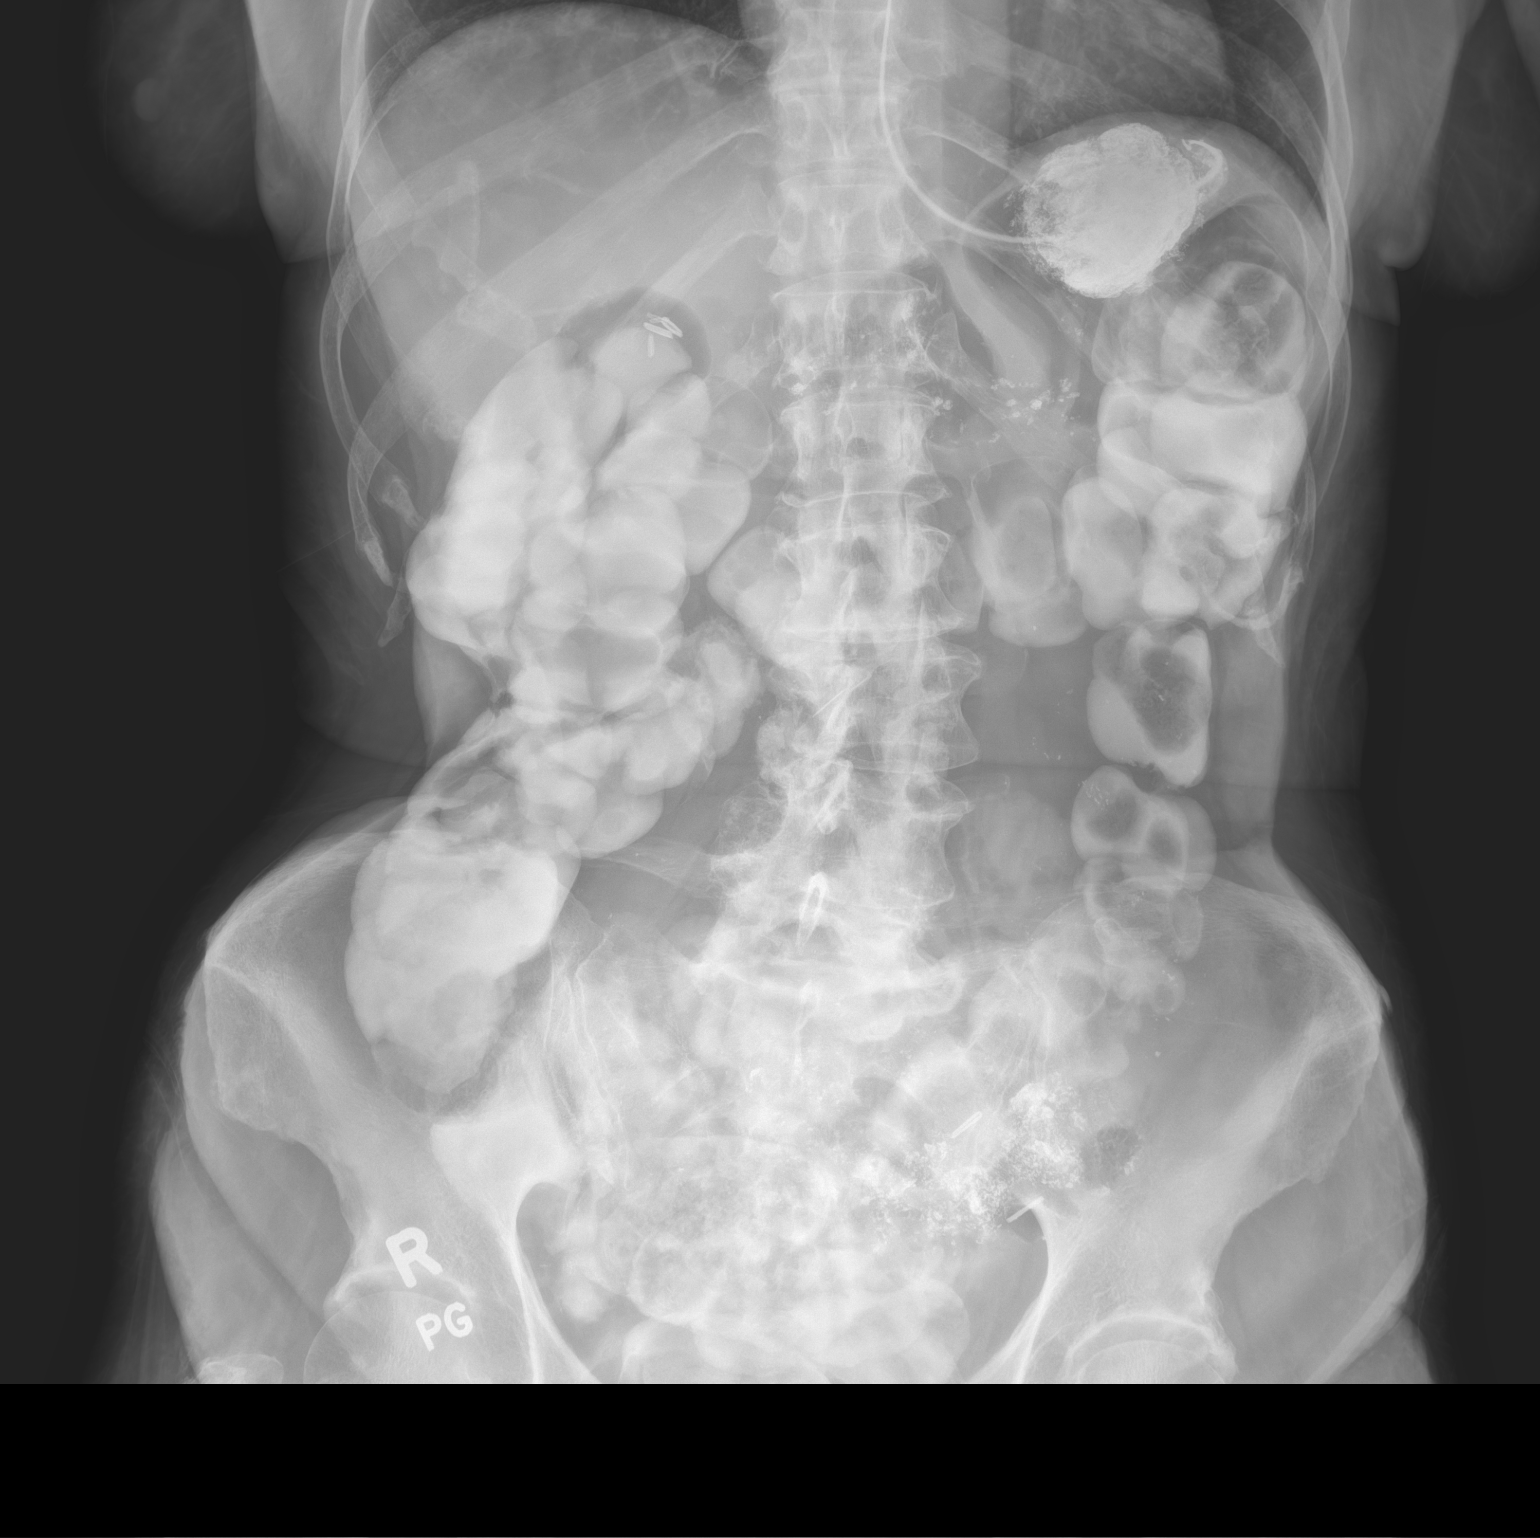

[1 of 1 positions shown; findings below may reference images not displayed]

FINDINGS: Distal tip of nasogastric tube is seen in proximal stomach. Large
amount of residual contrast is seen throughout the nondilated colon.
No abnormal bowel dilatation is noted. Status post cholecystectomy.
IMPRESSION: No abnormal bowel dilatation is noted.

## 2020-09-01 MED ORDER — DULOXETINE HCL 60 MG PO CPEP
60.0000 mg | ORAL_CAPSULE | Freq: Every day | ORAL | Status: DC
  Administered 2020-09-01: 60 mg via ORAL
  Filled 2020-09-01: qty 1

## 2020-09-01 MED ORDER — LEFLUNOMIDE 20 MG PO TABS
20.0000 mg | ORAL_TABLET | Freq: Every day | ORAL | Status: DC
  Filled 2020-09-01 (×2): qty 1

## 2020-09-01 NOTE — Progress Notes (Signed)
Progress Note: General Surgery Service   Chief Complaint/Subjective: Abdominal pain improved Having bowel movements  Objective: Vital signs in last 24 hours: Temp:  [97.7 F (36.5 C)-98.3 F (36.8 C)] 98.2 F (36.8 C) (08/28 0623) Pulse Rate:  [63-71] 65 (08/28 0623) Resp:  [17-18] 18 (08/28 0623) BP: (130-145)/(66-85) 145/79 (08/28 0623) SpO2:  [96 %-100 %] 98 % (08/28 0623)    Intake/Output from previous day: 08/27 0701 - 08/28 0700 In: 415.8 [P.O.:120; I.V.:277.1; IV Piggyback:18.7] Out: 350 [Emesis/NG output:200] Intake/Output this shift: No intake/output data recorded.  Constitutional: NAD; conversant; no deformities Eyes: Moist conjunctiva; no lid lag; anicteric; PERRL Neck: Trachea midline; no thyromegaly Lungs: Normal respiratory effort; no tactile fremitus CV: RRR; no palpable thrills; no pitting edema GI: Abd Sore, non-disended, scars well healed; no palpable hepatosplenomegaly MSK: Normal range of motion of extremities; no clubbing/cyanosis Psychiatric: Appropriate affect; alert and oriented x3 Lymphatic: No palpable cervical or axillary lymphadenopathy  Lab Results: CBC  Recent Labs    08/31/20 0202  WBC 9.5  HGB 14.4  HCT 43.9  PLT 163   BMET Recent Labs    08/31/20 0202 09/01/20 0506  NA 140 142  K 3.5 3.8  CL 101 111  CO2 28 27  GLUCOSE 100* 90  BUN 26* 17  CREATININE 0.77 0.69  CALCIUM 10.2 8.5*   PT/INR No results for input(s): LABPROT, INR in the last 72 hours. ABG No results for input(s): PHART, HCO3 in the last 72 hours.  Invalid input(s): PCO2, PO2  Anti-infectives: Anti-infectives (From admission, onward)    None       Medications: Scheduled Meds:  azelastine  1 spray Each Nare BID   enoxaparin (LOVENOX) injection  40 mg Subcutaneous Q24H   Continuous Infusions:  sodium chloride 75 mL/hr at 08/31/20 1211   PRN Meds:.acetaminophen **OR** acetaminophen, fentaNYL (SUBLIMAZE) injection, HYDROmorphone (DILAUDID)  injection, ondansetron **OR** ondansetron (ZOFRAN) IV  Assessment/Plan: Ms. Agius is a 65 year old female with a likely adhesive small bowel obstruction.  Small bowel protocol with contrast in colon Clamp NG and try clear liquids today, may be able to remove NG this afternoon   LOS: 1 day   Remainder of care per primary team    Felicie Morn, MD  [facility] Use [email or web address] to contact on call provider

## 2020-09-01 NOTE — Progress Notes (Signed)
[facility]                                                                              Patient Demographics  Emily Wolfe, is a 65 y.o. female, DOB - 1955/12/23, [id number]  Admit date - 08/31/2020   Admitting Physician Vianne Bulls, MD  Outpatient Primary MD for the patient is Vivi Barrack, MD  Outpatient specialists:   LOS - 1  days   Medical records reviewed and are as summarized below:    Chief Complaint  Patient presents with   Abdominal Pain       Brief summary   Patient is a 65 year old female with multiple abdominal surgeries, last surgery in 2019, HTN, HLD, history of ovarian CA, MS presented with nausea vomiting and abdominal pain. CT abdomen showed SBO, likely related to adhesions with a transition point in the midline along peritoneal lesion.  Large amount of stool throughout the colon. General surgery consulted  Assessment & Plan    Principal Problem:   SBO (small bowel obstruction) (HCC) -In the setting of prior multiple abdominal surgeries, SBO -Placed on n.p.o. status, IV fluids, pain control, antiemetics, NGT to suction -Currently improving, multiple BMs today, NGT clamped -Surgery following, try clears today  Active Problems:   Hyperlipidemia -Not on any statin, confirmed from patient's PCP notes on 07/01/2020    Essential hypertension -BP currently controlled, not on any antihypertensives on the outpatient med list    MS (multiple sclerosis) ([place]) -Outpatient followed by neurology  Anxiety -Currently stable, will resume Cymbalta once tolerating diet   Code Status: DNR DVT Prophylaxis:  enoxaparin (LOVENOX) injection 40 mg Start: 08/31/20 2200   Level of Care: Level of care: Med-Surg Family Communication: Discussed all imaging results, lab results, explained to the patient o   Disposition Plan:     Status is: Inpatient  Remains inpatient appropriate because:Inpatient level of care appropriate due to  severity of illness  Dispo: The patient is from: Home              Anticipated d/c is to: Home              Patient currently is not medically stable to d/c.  Hopefully DC next 24 to 48 hours once tolerating diet and cleared by surgery   Difficult to place patient No      Time Spent in minutes   35 minutes  Procedures:  None  Consultants:   General surgery  Antimicrobials:   Anti-infectives (From admission, onward)    None          Medications  Scheduled Meds:  azelastine  1 spray Each Nare BID   enoxaparin (LOVENOX) injection  40 mg Subcutaneous Q24H   Continuous Infusions:  sodium chloride 75 mL/hr at 08/31/20 1211   PRN Meds:.acetaminophen **OR** acetaminophen, fentaNYL (SUBLIMAZE) injection, HYDROmorphone (DILAUDID) injection, ondansetron **OR** ondansetron (ZOFRAN) IV      Subjective:   Chlo… Bleak was seen and examined today.  Overall improving, had multiple BMs this morning.  No active nausea or vomiting.  No dizziness, chest pain, shortness of breath, ambulating in the room without any  difficulty.    Objective:   Vitals:   08/31/20 2136 09/01/20 0155 09/01/20 0623 09/01/20 0937  BP: 133/77 130/72 (!) 145/79 128/61  Pulse: 64 71 65 65  Resp: '17 17 18 16  '$ Temp: 98.2 F (36.8 C) 97.7 F (36.5 C) 98.2 F (36.8 C) 97.7 F (36.5 C)  TempSrc: Oral Oral Oral Oral  SpO2: 96% 96% 98% 99%  Weight:      Height:        Intake/Output Summary (Last 24 hours) at 09/01/2020 1027 Last data filed at 09/01/2020 0600 Gross per 24 hour  Intake 415.78 ml  Output 200 ml  Net 215.78 ml     Wt Readings from Last 3 Encounters:  08/31/20 57.2 kg  07/01/20 58.1 kg  05/27/20 60.3 kg     Exam General: Alert and oriented x 3, NAD Cardiovascular: S1 S2 auscultated, RRR Respiratory: CTA B Gastrointestinal: Soft, nontender, ND, + BS Ext: no pedal edema bilaterally Neuro: no new deficits, ambulating in the room Psych: Normal affect and demeanor, alert  and oriented x3    Data Reviewed:  I have personally reviewed following labs and imaging studies  Micro Results Recent Results (from the past 240 hour(s))  Resp Panel by RT-PCR (Flu A&B, Covid) Nasopharyngeal Swab     Status: None   Collection Time: 08/31/20  4:02 AM   Specimen: Nasopharyngeal Swab; Nasopharyngeal(NP) swabs in vial transport medium  Result Value Ref Range Status   SARS Coronavirus 2 by RT PCR NEGATIVE NEGATIVE Final    Comment: (NOTE) SARS-CoV-2 target nucleic acids are NOT DETECTED.  The SARS-CoV-2 RNA is generally detectable in upper respiratory specimens during the acute phase of infection. The lowest concentration of SARS-CoV-2 viral copies this assay can detect is 138 copies/mL. A negative result does not preclude SARS-Cov-2 infection and should not be used as the sole basis for treatment or other patient management decisions. A negative result may occur with  improper specimen collection/handling, submission of specimen other than nasopharyngeal swab, presence of viral mutation(s) within the areas targeted by this assay, and inadequate number of viral copies(<138 copies/mL). A negative result must be combined with clinical observations, patient history, and epidemiological information. The expected result is Negative.  Fact Sheet for Patients:  EntrepreneurPulse.com.au  Fact Sheet for Healthcare Providers:  IncredibleEmployment.be  This test is no t yet approved or cleared by the [place] FDA and  has been authorized for detection and/or diagnosis of SARS-CoV-2 by FDA under an Emergency Use Authorization (EUA). This EUA will remain  in effect (meaning this test can be used) for the duration of the COVID-19 declaration under Section 564(b)(1) of the Act, 21 U.S.C.section 360bbb-3(b)(1), unless the authorization is terminated  or revoked sooner.       Influenza A by PCR NEGATIVE NEGATIVE Final   Influenza B by  PCR NEGATIVE NEGATIVE Final    Comment: (NOTE) The Xpert Xpress SARS-CoV-2/FLU/RSV plus assay is intended as an aid in the diagnosis of influenza from Nasopharyngeal swab specimens and should not be used as a sole basis for treatment. Nasal washings and aspirates are unacceptable for Xpert Xpress SARS-CoV-2/FLU/RSV testing.  Fact Sheet for Patients: EntrepreneurPulse.com.au  Fact Sheet for Healthcare Providers: IncredibleEmployment.be  This test is not yet approved or cleared by the [place] FDA and has been authorized for detection and/or diagnosis of SARS-CoV-2 by FDA under an Emergency Use Authorization (EUA). This EUA will remain in effect (meaning this test can be used) for the duration of the  COVID-19 declaration under Section 564(b)(1) of the Act, 21 U.S.C. section 360bbb-3(b)(1), unless the authorization is terminated or revoked.  Performed at [facility], [address] [place], [place] [address]     Radiology Reports DG Chest 1 View  Result Date: 08/31/2020 CLINICAL DATA:  Evaluate NG tube placement EXAM: CHEST  1 VIEW COMPARISON:  Earlier today FINDINGS: The nasogastric tube tip is just below the level of the GE junction by approximately 1.9 cm with side port above the GE junction. Recommend advancing NG tube by at least 10 cm. IMPRESSION: Under advanced NG tube with side port above GE junction. Recommend advancing by approximately 10 cm. Electronically Signed   By: Kerby Moors M.D.   On: 08/31/2020 05:40   CT ABDOMEN PELVIS W CONTRAST  Result Date: 08/31/2020 CLINICAL DATA:  Concern for bowel obstruction. EXAM: CT ABDOMEN AND PELVIS WITH CONTRAST TECHNIQUE: Multidetector CT imaging of the abdomen and pelvis was performed using the standard protocol following bolus administration of intravenous contrast. CONTRAST:  38m OMNIPAQUE IOHEXOL 350 MG/ML SOLN COMPARISON:  CT abdomen pelvis dated 03/21/2019.  FINDINGS: Lower chest: The visualized lung bases are clear. No intra-abdominal free air.  Small free fluid within the pelvis. Hepatobiliary: The liver is unremarkable. There is mild intrahepatic biliary ductal dilatation, post cholecystectomy. No retained calcified stone noted in the central CBD. Pancreas: Unremarkable. No pancreatic ductal dilatation or surrounding inflammatory changes. Spleen: Normal in size without focal abnormality. Adrenals/Urinary Tract: The adrenal glands unremarkable. The kidneys, visualized ureters, and urinary bladder appear unremarkable. Stomach/Bowel: There is postsurgical changes of small bowel with anastomotic suture in the lower abdomen. Multiple dilated and fluid-filled loops of small bowel in the mid to lower abdomen measure up to 4 cm in caliber. There is abutment of these loops of small bowel to the anterior peritoneal wall consistent with peritoneal adhesions. The distal small bowel and terminal ileum demonstrate a normal caliber. Findings consistent with small-bowel obstruction, likely related to adhesions with the transition point likely in the midline along the peritoneal adhesion (50/2 and 60/7). Surgical sutures also noted in the region of the sigmoid. There is large amount of stool throughout the colon. Vascular/Lymphatic: The abdominal aorta and IVC unremarkable. No pain venous gas. There is no adenopathy. Reproductive: Hysterectomy. Other: Midline vertical anterior pelvic wall incisional scar. Musculoskeletal: Degenerative changes of the spine. No acute osseous pathology. IMPRESSION: 1. Small-bowel obstruction, likely related to peritoneal adhesions with the transition point likely in the midline along the peritoneal adhesion. 2. Large amount of stool throughout the colon. Electronically Signed   By: AAnner CreteM.D.   On: 08/31/2020 03:48   DG Abd Portable 1V-Small Bowel Obstruction Protocol-initial, 8 hr delay  Result Date: 09/01/2020 CLINICAL DATA:  Small  bowel obstruction. EXAM: PORTABLE ABDOMEN - 1 VIEW COMPARISON:  May 22, 2019. FINDINGS: Distal tip of nasogastric tube is seen in proximal stomach. Large amount of residual contrast is seen throughout the nondilated colon. No abnormal bowel dilatation is noted. Status post cholecystectomy. IMPRESSION: No abnormal bowel dilatation is noted. Electronically Signed   By: JMarijo ConceptionM.D.   On: 09/01/2020 08:14    Lab Data:  CBC: Recent Labs  Lab 08/31/20 0202  WBC 9.5  HGB 14.4  HCT 43.9  MCV 90.1  PLT [id number]  Basic Metabolic Panel: Recent Labs  Lab 08/31/20 0202 09/01/20 0506  NA 140 142  K 3.5 3.8  CL 101 111  CO2 28 27  GLUCOSE 100* 90  BUN  26* 17  CREATININE 0.77 0.69  CALCIUM 10.2 8.5*   GFR: Estimated Creatinine Clearance: 63.1 mL/min (by C-G formula based on SCr of 0.69 mg/dL). Liver Function Tests: Recent Labs  Lab 08/31/20 0202  AST 17  ALT 13  ALKPHOS 70  BILITOT 0.7  PROT 7.0  ALBUMIN 4.6   Recent Labs  Lab 08/31/20 0202  LIPASE 16   No results for input(s): AMMONIA in the last 168 hours. Coagulation Profile: No results for input(s): INR, PROTIME in the last 168 hours. Cardiac Enzymes: No results for input(s): CKTOTAL, CKMB, CKMBINDEX, TROPONINI in the last 168 hours. BNP (last 3 results) No results for input(s): PROBNP in the last 8760 hours. HbA1C: No results for input(s): HGBA1C in the last 72 hours. CBG: Recent Labs  Lab 08/31/20 1145 08/31/20 1807 09/01/20 0014 09/01/20 0625  GLUCAP 90 95 86 89   Lipid Profile: No results for input(s): CHOL, HDL, LDLCALC, TRIG, CHOLHDL, LDLDIRECT in the last 72 hours. Thyroid Function Tests: No results for input(s): TSH, T4TOTAL, FREET4, T3FREE, THYROIDAB in the last 72 hours. Anemia Panel: No results for input(s): VITAMINB12, FOLATE, FERRITIN, TIBC, IRON, RETICCTPCT in the last 72 hours. Urine analysis:    Component Value Date/Time   COLORURINE YELLOW 03/21/2019 0133   APPEARANCEUR HAZY (A)  03/21/2019 0133   LABSPEC 1.018 03/21/2019 0133   PHURINE 8.0 03/21/2019 0133   GLUCOSEU NEGATIVE 03/21/2019 0133   HGBUR NEGATIVE 03/21/2019 0133   BILIRUBINUR NEGATIVE 03/21/2019 0133   KETONESUR 20 (A) 03/21/2019 0133   PROTEINUR 30 (A) 03/21/2019 0133   NITRITE NEGATIVE 03/21/2019 0133   LEUKOCYTESUR NEGATIVE 03/21/2019 0133     Hayley Horn M.D. [facility] 09/01/2020, 10:27 AM  Available via Epic secure chat 7am-7pm After 7 pm, please refer to night coverage provider listed on amion.

## 2020-09-01 NOTE — Plan of Care (Signed)

## 2020-09-02 LAB — GLUCOSE, CAPILLARY
Glucose-Capillary: 101 mg/dL — ABNORMAL HIGH (ref 70–99)
Glucose-Capillary: 89 mg/dL (ref 70–99)

## 2020-09-02 LAB — BASIC METABOLIC PANEL
Anion gap: 4 — ABNORMAL LOW (ref 5–15)
BUN: 10 mg/dL (ref 8–23)
CO2: 29 mmol/L (ref 22–32)
Calcium: 9.5 mg/dL (ref 8.9–10.3)
Chloride: 107 mmol/L (ref 98–111)
Creatinine, Ser: 0.67 mg/dL (ref 0.44–1.00)
GFR, Estimated: >60 mL/min (ref >60)
Glucose, Bld: 103 mg/dL — ABNORMAL HIGH (ref 70–99)
Potassium: 3.8 mmol/L (ref 3.5–5.1)
Sodium: 140 mmol/L (ref 135–145)

## 2020-09-02 LAB — MAGNESIUM: Magnesium: 2.2 mg/dL (ref 1.7–2.4)

## 2020-09-02 NOTE — Discharge Summary (Signed)
Physician Discharge Summary   Patient ID: Emily Wolfe MRN: [id number] DOB/AGE: [birth year] 65 y.o.  Admit date: 08/31/2020 Discharge date: 09/02/2020  Primary Care Physician:  Vivi Barrack, MD   Recommendations for Outpatient Follow-up:  Follow up with PCP in 1-2 weeks  Home Health: At baseline, ambulating in room Equipment/Devices:   Discharge Condition:  CODE STATUS: FULL  Diet recommendation: Heart healthy diet   Discharge Diagnoses:     SBO (small bowel obstruction) (HCC)  Anxiety  Essential hypertension  Hyperlipidemia  MS (multiple sclerosis) (HCC)   Consults: Surgery    Allergies:  No Known Allergies   DISCHARGE MEDICATIONS: Allergies as of 09/02/2020   No Known Allergies      Medication List     TAKE these medications    azelastine 0.1 % nasal spray Commonly known as: ASTELIN PLACE 2 SPRAYS INTO BOTH NOSTRILS 2 TIMES DAILY. What changed: See the new instructions.   DULoxetine 60 MG capsule Commonly known as: CYMBALTA Take 60 mg by mouth daily.   EPINEPHrine 0.3 mg/0.3 mL Soaj injection Commonly known as: Auvi-Q Inject 0.3 mg into the muscle as needed for anaphylaxis. What changed: when to take this   ipratropium 0.06 % nasal spray Commonly known as: ATROVENT Place 2 sprays into both nostrils 4 (four) times daily.   leflunomide 10 MG tablet Commonly known as: ARAVA Take 10 mg by mouth daily.   loratadine 10 MG tablet Commonly known as: CLARITIN Take 10 mg by mouth daily.   Vitamin D3 1.25 MG (50000 UT) Caps Take 50,000 Units by mouth once a week. On Sunday         Brief H and P: For complete details please refer to admission H and P, but in brief Patient is a 65 year old female with multiple abdominal surgeries, last surgery in 2019, HTN, HLD, history of ovarian CA, MS presented with nausea vomiting and abdominal pain. CT abdomen showed SBO, likely related to adhesions with a transition point in the midline along  peritoneal lesion.  Large amount of stool throughout the colon. General surgery consulted    Hospital Course:  SBO (small bowel obstruction) (HCC) -In the setting of prior multiple abdominal surgeries, SBO -Patient was placed on IV fluids, n.p.o. status, pain control, NGT to intermittent wall suction -Patient had remarkable improvement and multiple BMs.   -NGT was removed, patient is now tolerating solid diet and was discharged home in stable condition    Hyperlipidemia -Not on any statin, confirmed from patient's PCP notes on 07/01/2020     Essential hypertension -BP stable     MS (multiple sclerosis) ([place]) -Outpatient followed by neurology, continue Arava   Anxiety -Continue Cymbalta    Day of Discharge S: Tolerating diet, without any difficulty, eager to go home  BP 138/84 (BP Location: Right Arm)   Pulse 73   Temp 98.4 F (36.9 C) (Oral)   Resp 18   Ht '5\' 5"'$  (1.651 m)   Wt 57.2 kg   SpO2 98%   BMI 20.97 kg/m   Physical Exam: General: Alert and awake oriented x3 not in any acute distress. CVS: S1-S2 clear no murmur rubs or gallops Chest: clear to auscultation bilaterally, no wheezing rales or rhonchi Abdomen: soft nontender, nondistended, normal bowel sounds Extremities: no cyanosis, clubbing or edema noted bilaterally     Get Medicines reviewed and adjusted: Please take all your medications with you for your next visit with your Primary MD  Please request your Primary MD to go over  all hospital tests and procedure/radiological results at the follow up. Please ask your Primary MD to get all Hospital records sent to his/her office.  If you experience worsening of your admission symptoms, develop shortness of breath, life threatening emergency, suicidal or homicidal thoughts you must seek medical attention immediately by calling 911 or calling your MD immediately  if symptoms less severe.  You must read complete instructions/literature along with all the  possible adverse reactions/side effects for all the Medicines you take and that have been prescribed to you. Take any new Medicines after you have completely understood and accept all the possible adverse reactions/side effects.   Do not drive when taking pain medications.   Do not take more than prescribed Pain, Sleep and Anxiety Medications  Special Instructions: If you have smoked or chewed Tobacco  in the last 2 yrs please stop smoking, stop any regular Alcohol  and or any Recreational drug use.  Wear Seat belts while driving.  Please note  You were cared for by a hospitalist during your hospital stay. Once you are discharged, your primary care physician will handle any further medical issues. Please note that NO REFILLS for any discharge medications will be authorized once you are discharged, as it is imperative that you return to your primary care physician (or establish a relationship with a primary care physician if you do not have one) for your aftercare needs so that they can reassess your need for medications and monitor your lab values.   The results of significant diagnostics from this hospitalization (including imaging, microbiology, ancillary and laboratory) are listed below for reference.      Procedures/Studies:  DG Chest 1 View  Result Date: 08/31/2020 CLINICAL DATA:  Evaluate NG tube placement EXAM: CHEST  1 VIEW COMPARISON:  Earlier today FINDINGS: The nasogastric tube tip is just below the level of the GE junction by approximately 1.9 cm with side port above the GE junction. Recommend advancing NG tube by at least 10 cm. IMPRESSION: Under advanced NG tube with side port above GE junction. Recommend advancing by approximately 10 cm. Electronically Signed   By: Kerby Moors M.D.   On: 08/31/2020 05:40   CT ABDOMEN PELVIS W CONTRAST  Result Date: 08/31/2020 CLINICAL DATA:  Concern for bowel obstruction. EXAM: CT ABDOMEN AND PELVIS WITH CONTRAST TECHNIQUE: Multidetector  CT imaging of the abdomen and pelvis was performed using the standard protocol following bolus administration of intravenous contrast. CONTRAST:  56m OMNIPAQUE IOHEXOL 350 MG/ML SOLN COMPARISON:  CT abdomen pelvis dated 03/21/2019. FINDINGS: Lower chest: The visualized lung bases are clear. No intra-abdominal free air.  Small free fluid within the pelvis. Hepatobiliary: The liver is unremarkable. There is mild intrahepatic biliary ductal dilatation, post cholecystectomy. No retained calcified stone noted in the central CBD. Pancreas: Unremarkable. No pancreatic ductal dilatation or surrounding inflammatory changes. Spleen: Normal in size without focal abnormality. Adrenals/Urinary Tract: The adrenal glands unremarkable. The kidneys, visualized ureters, and urinary bladder appear unremarkable. Stomach/Bowel: There is postsurgical changes of small bowel with anastomotic suture in the lower abdomen. Multiple dilated and fluid-filled loops of small bowel in the mid to lower abdomen measure up to 4 cm in caliber. There is abutment of these loops of small bowel to the anterior peritoneal wall consistent with peritoneal adhesions. The distal small bowel and terminal ileum demonstrate a normal caliber. Findings consistent with small-bowel obstruction, likely related to adhesions with the transition point likely in the midline along the peritoneal adhesion (50/2 and 60/7). Surgical  sutures also noted in the region of the sigmoid. There is large amount of stool throughout the colon. Vascular/Lymphatic: The abdominal aorta and IVC unremarkable. No pain venous gas. There is no adenopathy. Reproductive: Hysterectomy. Other: Midline vertical anterior pelvic wall incisional scar. Musculoskeletal: Degenerative changes of the spine. No acute osseous pathology. IMPRESSION: 1. Small-bowel obstruction, likely related to peritoneal adhesions with the transition point likely in the midline along the peritoneal adhesion. 2. Large amount  of stool throughout the colon. Electronically Signed   By: Anner Crete M.D.   On: 08/31/2020 03:48   DG Abd Portable 1V-Small Bowel Obstruction Protocol-initial, 8 hr delay  Result Date: 09/01/2020 CLINICAL DATA:  Small bowel obstruction. EXAM: PORTABLE ABDOMEN - 1 VIEW COMPARISON:  May 22, 2019. FINDINGS: Distal tip of nasogastric tube is seen in proximal stomach. Large amount of residual contrast is seen throughout the nondilated colon. No abnormal bowel dilatation is noted. Status post cholecystectomy. IMPRESSION: No abnormal bowel dilatation is noted. Electronically Signed   By: Marijo Conception M.D.   On: 09/01/2020 08:14      LAB RESULTS: Basic Metabolic Panel: Recent Labs  Lab 09/01/20 0506 09/02/20 0753  NA 142 140  K 3.8 3.8  CL 111 107  CO2 27 29  GLUCOSE 90 103*  BUN 17 10  CREATININE 0.69 0.67  CALCIUM 8.5* 9.5  MG  --  2.2   Liver Function Tests: Recent Labs  Lab 08/31/20 0202  AST 17  ALT 13  ALKPHOS 70  BILITOT 0.7  PROT 7.0  ALBUMIN 4.6   Recent Labs  Lab 08/31/20 0202  LIPASE 16   No results for input(s): AMMONIA in the last 168 hours. CBC: Recent Labs  Lab 08/31/20 0202  WBC 9.5  HGB 14.4  HCT 43.9  MCV 90.1  PLT 163   Cardiac Enzymes: No results for input(s): CKTOTAL, CKMB, CKMBINDEX, TROPONINI in the last 168 hours. BNP: Invalid input(s): POCBNP CBG: Recent Labs  Lab 09/02/20 0001 09/02/20 0627  GLUCAP 89 101*       Disposition and Follow-up: Discharge Instructions     Diet - low sodium heart healthy   Complete by: As directed    Increase activity slowly   Complete by: As directed         DISPOSITION: Home   DISCHARGE FOLLOW-UP  Follow-up Information     Vivi Barrack, MD. Schedule an appointment as soon as possible for a visit in 2 week(s).   Specialty: Family Medicine Why: for hospital follow-up Contact information: [place] [address] [phone]                  Time  coordinating discharge:  25 minutes  Signed:   Estill Cotta M.D. [facility] 09/02/2020, 6:21 PM

## 2020-09-02 NOTE — Progress Notes (Signed)
Progress Note: General Surgery Service   Chief Complaint/Subjective: No abdominal pain, no nausea, no vomiting. Having flatus and Bms. No distention. Tol diet.  Objective: Vital signs in last 24 hours: Temp:  [97.4 F (36.3 C)-98.6 F (37 C)] 98.4 F (36.9 C) (08/29 0628) Pulse Rate:  [65-73] 73 (08/29 0628) Resp:  [16-18] 18 (08/29 0628) BP: (128-146)/(61-85) 138/84 (08/29 0628) SpO2:  [98 %-99 %] 98 % (08/29 0628) Last BM Date: 09/01/20  Intake/Output from previous day: 08/28 0701 - 08/29 0700 In: 2025 [P.O.:1500; I.V.:525] Out: 0  Intake/Output this shift: No intake/output data recorded.  Constitutional: NAD; conversant Eyes: Moist conjunctiva Neck: Trachea midline; no thyromegaly Lungs: Normal respiratory effort CV: RRR; no palpable thrills; no pitting edema GI: Abd Sore, non-disended, scars well healed; no palpable hepatosplenomegaly MSK: Normal range of motion of extremities Psychiatric: Appropriate affect; alert and oriented x3  Lab Results: CBC  Recent Labs    08/31/20 0202  WBC 9.5  HGB 14.4  HCT 43.9  PLT 163   BMET Recent Labs    09/01/20 0506 09/02/20 0753  NA 142 140  K 3.8 3.8  CL 111 107  CO2 27 29  GLUCOSE 90 103*  BUN 17 10  CREATININE 0.69 0.67  CALCIUM 8.5* 9.5   PT/INR No results for input(s): LABPROT, INR in the last 72 hours. ABG No results for input(s): PHART, HCO3 in the last 72 hours.  Invalid input(s): PCO2, PO2  Anti-infectives: Anti-infectives (From admission, onward)    None       Medications: Scheduled Meds:  azelastine  1 spray Each Nare BID   DULoxetine  60 mg Oral Daily   enoxaparin (LOVENOX) injection  40 mg Subcutaneous Q24H   leflunomide  20 mg Oral Daily   Continuous Infusions:  sodium chloride 75 mL/hr at 08/31/20 1211   PRN Meds:.acetaminophen **OR** acetaminophen, fentaNYL (SUBLIMAZE) injection, HYDROmorphone (DILAUDID) injection, ondansetron **OR** ondansetron (ZOFRAN)  IV  Assessment/Plan: Emily Wolfe is a 65 year old female with a likely adhesive small bowel obstruction.  -Diet as tolerated - discussed potential avoidance of raw uncooked fruits/vegetables; ruffage; chewing food well; may reduce chances of recurrence   LOS: 2 days   Emily Landau, MD [facility] Surgery Use [email or web address] to contact on call provider

## 2020-09-03 ENCOUNTER — Telehealth: Payer: Self-pay

## 2020-09-03 NOTE — Telephone Encounter (Signed)
Transition Care Management Follow-up Telephone Call Date of discharge and from where: 09/02/20 Emily Wolfe How have you been since you were released from the hospital? Pt states she is doing well Any questions or concerns? No  Items Reviewed: Did the pt receive and understand the discharge instructions provided? Yes  Medications obtained and verified? Yes  Other? No  Any new allergies since your discharge? No  Dietary orders reviewed? Yes Do you have support at home? Yes   Home Care and Equipment/Supplies: Were home health services ordered? no   Were any new equipment or medical supplies ordered?  No   Functional Questionnaire: (I = Independent and D = Dependent) ADLs: I  Bathing/Dressing- I  Meal Prep- I  Eating- I  Maintaining continence- I  Transferring/Ambulation- I  Managing Meds- I  Follow up appointments reviewed:  PCP Hospital f/u appt confirmed? No  pt declined to schedule hospital follow up appt at this time [facility] f/u appt confirmed? No  pt states she plans to contact gastroenterology for follow up and will notify PCP if she needs a new referral Are transportation arrangements needed? No  If their condition worsens, is the pt aware to call PCP or go to the Emergency Dept.? Yes Was the patient provided with contact information for the PCP's office or ED? Yes Was to pt encouraged to call back with questions or concerns? Yes

## 2020-09-03 NOTE — Telephone Encounter (Signed)
It is ok to hold off for now thanks.  Algis Greenhouse. Jerline Pain, MD 09/03/2020 4:05 PM

## 2020-09-05 ENCOUNTER — Ambulatory Visit (INDEPENDENT_AMBULATORY_CARE_PROVIDER_SITE_OTHER): Payer: Medicare Other | Admitting: *Deleted

## 2020-09-05 DIAGNOSIS — J309 Allergic rhinitis, unspecified: Secondary | ICD-10-CM | POA: Diagnosis not present

## 2020-09-12 ENCOUNTER — Encounter: Payer: Self-pay | Admitting: Gastroenterology

## 2020-09-12 ENCOUNTER — Ambulatory Visit (INDEPENDENT_AMBULATORY_CARE_PROVIDER_SITE_OTHER): Payer: Medicare Other | Admitting: Gastroenterology

## 2020-09-12 VITALS — BP 120/50 | HR 84 | Ht 64.0 in | Wt 128.0 lb

## 2020-09-12 DIAGNOSIS — Z86010 Personal history of colonic polyps: Secondary | ICD-10-CM | POA: Diagnosis not present

## 2020-09-12 DIAGNOSIS — Z8719 Personal history of other diseases of the digestive system: Secondary | ICD-10-CM | POA: Diagnosis not present

## 2020-09-12 DIAGNOSIS — K5900 Constipation, unspecified: Secondary | ICD-10-CM

## 2020-09-12 NOTE — Patient Instructions (Signed)
Follow up as needed.   Thank you for choosing me and [facility].  Malcolm T. Stark, Jr., MD., FACG  

## 2020-09-12 NOTE — Progress Notes (Signed)
    History of Present Illness: This is a 65 year old female with recurrent small bowel obstructions.  She was recently hospitalized and evaluated by [facility] surgery.  I have reviewed their notes.  She had a prompt recovery and has felt well since discharge.  She has occasional mild constipation and increased stool burden was noted on her recent CT AP.  She states she modulates her fiber intake for adequate bowel movements daily and also takes magnesium daily.  See April 05, 2019 office note by Nicoletta Ba, PA-C for additional information.  Colonoscopy October 2017: Few nonbleeding diverticuli in the sigmoid colon, evidence of prior end-to-side colocolonic anastomosis in the sigmoid colon, surgical changes consistent with left hemicolectomy and widely patent anastomosis, small nonbleeding grade 1 internal hemorrhoids and a single diminutive polyp was removed from the sigmoid colon.  Path consistent with tubular adenoma.  Current Medications, Allergies, Past Medical History, Past Surgical History, Family History and Social History were reviewed in [employer] record.   Physical Exam: General: Well developed, well nourished, no acute distress Head: Normocephalic and atraumatic Eyes: Sclerae anicteric, EOMI Ears: Normal auditory acuity Mouth: Not examined, mask on during Covid-19 pandemic Lungs: Clear throughout to auscultation Heart: Regular rate and rhythm; no murmurs, rubs or bruits Abdomen: Soft, non tender and non distended. No masses, hepatosplenomegaly or hernias noted. Normal Bowel sounds Rectal: Deferred Musculoskeletal: Symmetrical with no gross deformities  Pulses:  Normal pulses noted Extremities: No clubbing, cyanosis, edema or deformities noted Neurological: Alert oriented x 4, grossly nonfocal Psychological:  Alert and cooperative. Normal mood and affect   Assessment and Recommendations:  Recurrent partial SBOs felt secondary to adhesions -  recent episode has resolved.  History of ovarian cancer and multiple prior abdominal surgeries.  Dr. Dema Severin, CCS, advised potential avoidance of raw uncooked fruits/vegetables; ruffage; chewing food well; may reduce chances of recurrence.  She is advised to follow-up with [facility] for recurrent obstructions. Mild constipation.  Follow above dietary advice from Dr. Dema Severin.  Continue higher fiber diet with adequate daily water intake and magnesium tablets daily. GI follow up as needed.  Personal history of adenomatous colon polyps.  Surveillance colonoscopy recommended in October 2024. MS. Follow-up with neurology.

## 2020-09-13 ENCOUNTER — Ambulatory Visit (INDEPENDENT_AMBULATORY_CARE_PROVIDER_SITE_OTHER): Payer: Medicare Other

## 2020-09-13 DIAGNOSIS — J309 Allergic rhinitis, unspecified: Secondary | ICD-10-CM

## 2020-09-25 ENCOUNTER — Ambulatory Visit (INDEPENDENT_AMBULATORY_CARE_PROVIDER_SITE_OTHER): Payer: Medicare Other

## 2020-09-25 DIAGNOSIS — J309 Allergic rhinitis, unspecified: Secondary | ICD-10-CM | POA: Diagnosis not present

## 2020-10-02 ENCOUNTER — Ambulatory Visit (INDEPENDENT_AMBULATORY_CARE_PROVIDER_SITE_OTHER): Payer: Medicare Other | Admitting: *Deleted

## 2020-10-02 DIAGNOSIS — J309 Allergic rhinitis, unspecified: Secondary | ICD-10-CM | POA: Diagnosis not present

## 2020-10-02 NOTE — Progress Notes (Signed)
VIALS MADE. EXP 10-02-21 

## 2020-10-03 DIAGNOSIS — J301 Allergic rhinitis due to pollen: Secondary | ICD-10-CM | POA: Diagnosis not present

## 2020-10-04 DIAGNOSIS — J3089 Other allergic rhinitis: Secondary | ICD-10-CM | POA: Diagnosis not present

## 2020-10-08 DIAGNOSIS — M654 Radial styloid tenosynovitis [de Quervain]: Secondary | ICD-10-CM | POA: Diagnosis not present

## 2020-10-08 DIAGNOSIS — M25532 Pain in left wrist: Secondary | ICD-10-CM | POA: Diagnosis not present

## 2020-10-09 ENCOUNTER — Ambulatory Visit (INDEPENDENT_AMBULATORY_CARE_PROVIDER_SITE_OTHER): Payer: Medicare Other | Admitting: *Deleted

## 2020-10-09 DIAGNOSIS — J309 Allergic rhinitis, unspecified: Secondary | ICD-10-CM

## 2020-10-16 ENCOUNTER — Ambulatory Visit (INDEPENDENT_AMBULATORY_CARE_PROVIDER_SITE_OTHER): Payer: Medicare Other

## 2020-10-16 DIAGNOSIS — J309 Allergic rhinitis, unspecified: Secondary | ICD-10-CM

## 2020-10-23 ENCOUNTER — Ambulatory Visit (INDEPENDENT_AMBULATORY_CARE_PROVIDER_SITE_OTHER): Payer: Medicare Other | Admitting: *Deleted

## 2020-10-23 DIAGNOSIS — J309 Allergic rhinitis, unspecified: Secondary | ICD-10-CM

## 2020-10-25 DIAGNOSIS — E538 Deficiency of other specified B group vitamins: Secondary | ICD-10-CM | POA: Diagnosis not present

## 2020-10-25 DIAGNOSIS — E559 Vitamin D deficiency, unspecified: Secondary | ICD-10-CM | POA: Diagnosis not present

## 2020-10-25 DIAGNOSIS — G35 Multiple sclerosis: Secondary | ICD-10-CM | POA: Diagnosis not present

## 2020-10-31 ENCOUNTER — Ambulatory Visit (INDEPENDENT_AMBULATORY_CARE_PROVIDER_SITE_OTHER): Payer: Medicare Other | Admitting: *Deleted

## 2020-10-31 DIAGNOSIS — J309 Allergic rhinitis, unspecified: Secondary | ICD-10-CM | POA: Diagnosis not present

## 2020-11-19 DIAGNOSIS — M654 Radial styloid tenosynovitis [de Quervain]: Secondary | ICD-10-CM | POA: Diagnosis not present

## 2020-11-21 ENCOUNTER — Ambulatory Visit (INDEPENDENT_AMBULATORY_CARE_PROVIDER_SITE_OTHER): Payer: Medicare Other | Admitting: *Deleted

## 2020-11-21 DIAGNOSIS — J309 Allergic rhinitis, unspecified: Secondary | ICD-10-CM

## 2020-11-22 ENCOUNTER — Other Ambulatory Visit: Payer: Self-pay

## 2020-11-22 ENCOUNTER — Encounter: Payer: Self-pay | Admitting: Family Medicine

## 2020-11-22 ENCOUNTER — Ambulatory Visit (INDEPENDENT_AMBULATORY_CARE_PROVIDER_SITE_OTHER): Payer: Medicare Other | Admitting: Family Medicine

## 2020-11-22 ENCOUNTER — Ambulatory Visit (INDEPENDENT_AMBULATORY_CARE_PROVIDER_SITE_OTHER)
Admission: RE | Admit: 2020-11-22 | Discharge: 2020-11-22 | Disposition: A | Discharge status: Home / Self Care | Payer: Medicare Other | Source: Ambulatory Visit | Attending: Family Medicine | Admitting: Family Medicine

## 2020-11-22 VITALS — BP 148/79 | HR 64 | Temp 98.0°F | Ht 64.0 in | Wt 131.4 lb

## 2020-11-22 DIAGNOSIS — M25551 Pain in right hip: Secondary | ICD-10-CM | POA: Diagnosis not present

## 2020-11-22 DIAGNOSIS — K56609 Unspecified intestinal obstruction, unspecified as to partial versus complete obstruction: Secondary | ICD-10-CM

## 2020-11-22 DIAGNOSIS — Z23 Encounter for immunization: Secondary | ICD-10-CM | POA: Diagnosis not present

## 2020-11-22 DIAGNOSIS — I10 Essential (primary) hypertension: Secondary | ICD-10-CM | POA: Diagnosis not present

## 2020-11-22 DIAGNOSIS — M1990 Unspecified osteoarthritis, unspecified site: Secondary | ICD-10-CM

## 2020-11-22 DIAGNOSIS — Z6830 Body mass index (BMI) 30.0-30.9, adult: Secondary | ICD-10-CM

## 2020-11-22 DIAGNOSIS — E669 Obesity, unspecified: Secondary | ICD-10-CM | POA: Diagnosis not present

## 2020-11-22 IMAGING — DX DG HIP (WITH OR WITHOUT PELVIS) 2-3V*R*
3 series · 3 of 3 positions shown · non-contrast
Comparison: KUB [DATE]

CLINICAL DATA: Right hip pain 6 months.  No injury.

EXAM:
DG HIP (WITH OR WITHOUT PELVIS) 2-3V RIGHT

[pelvis ap]
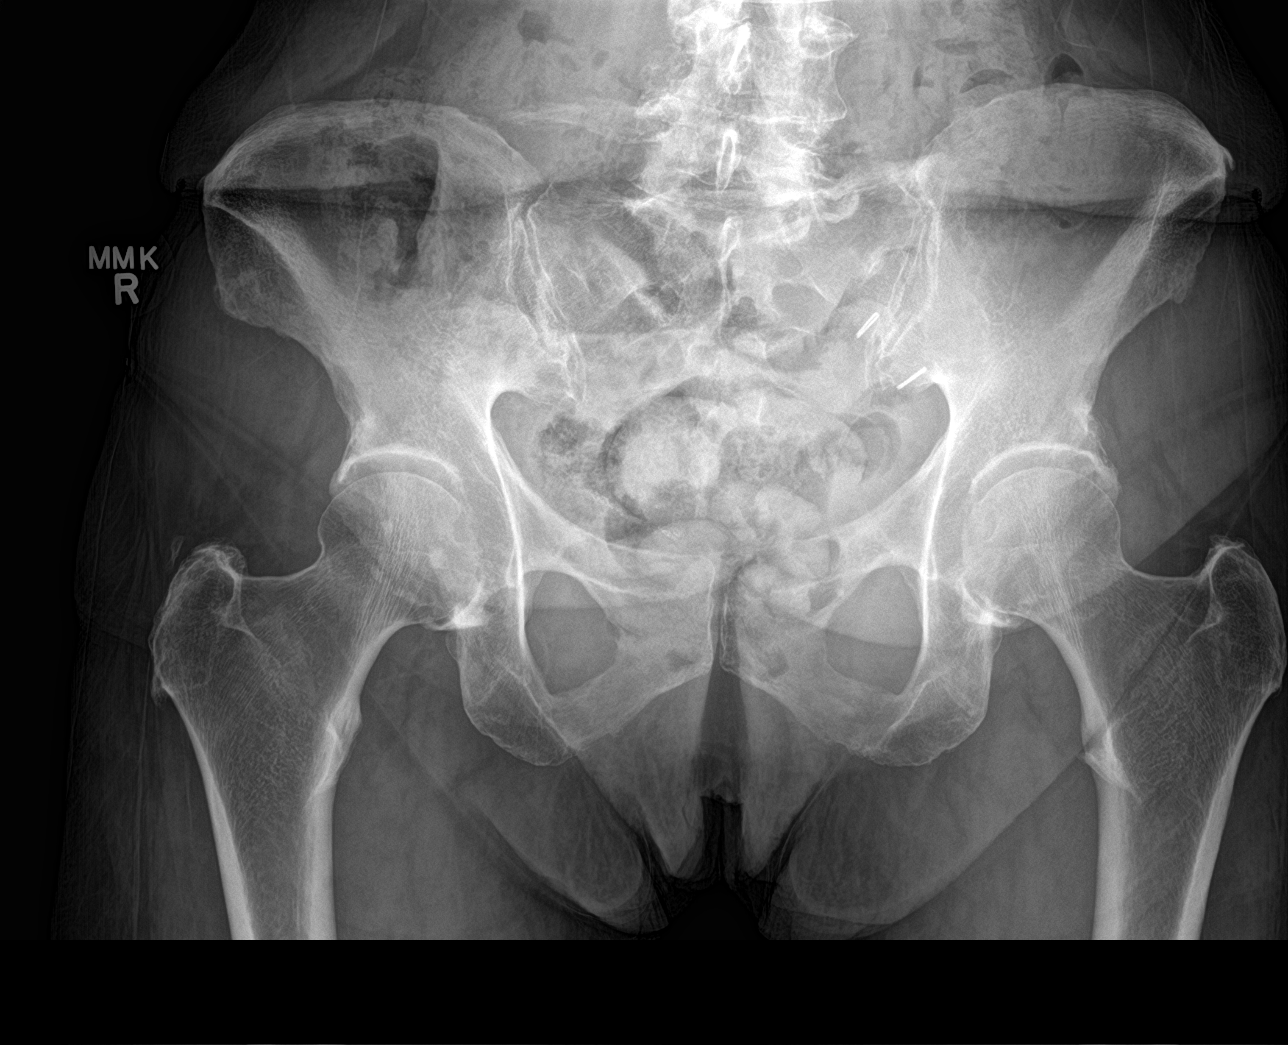

[hip ap]
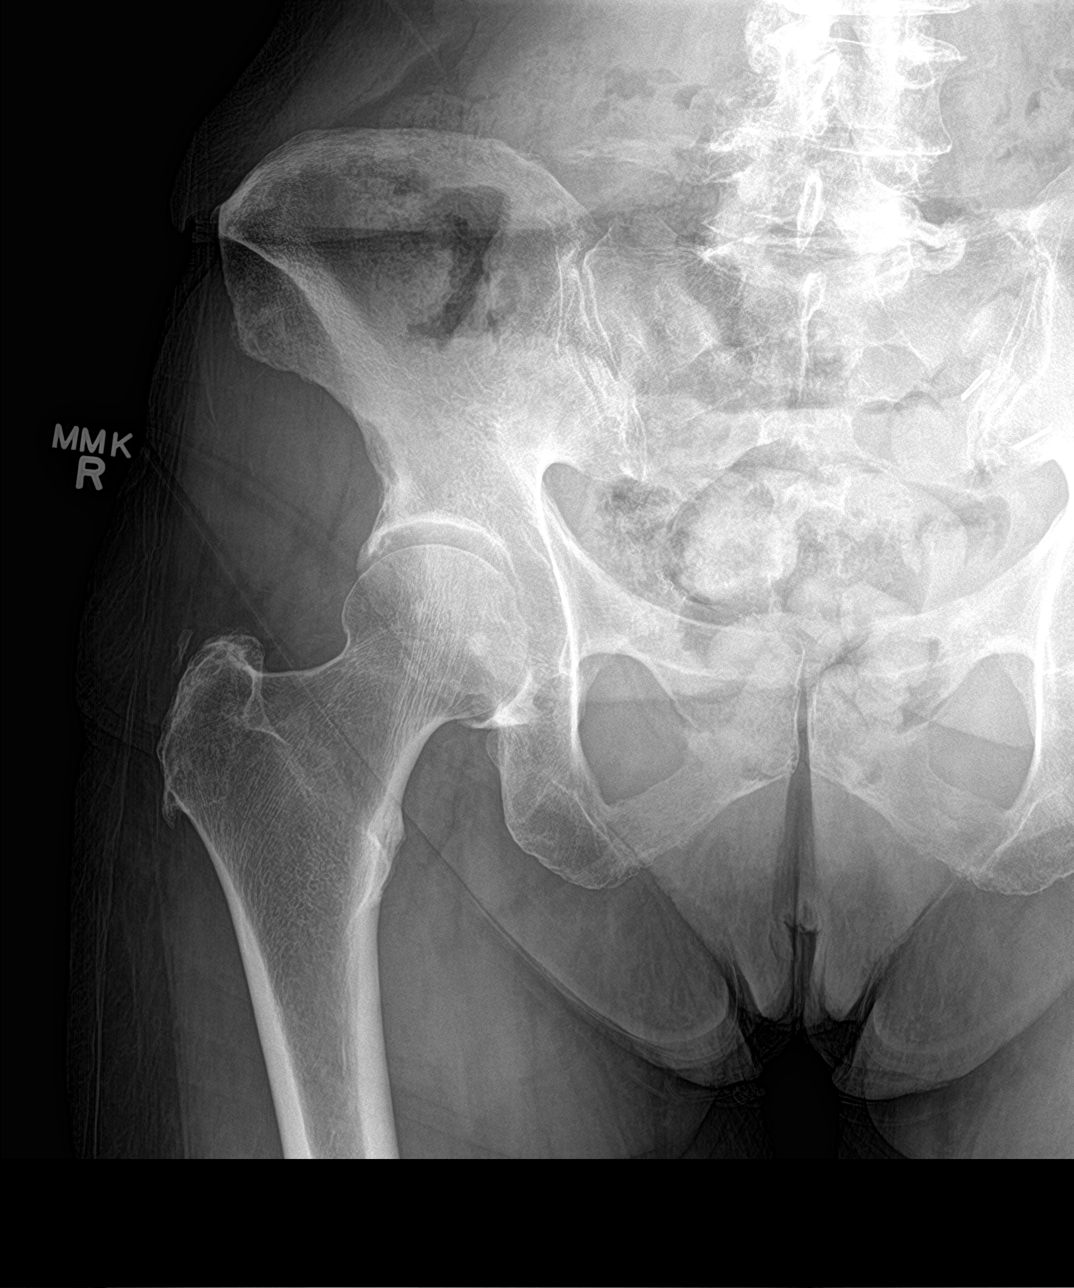

[hip lat]
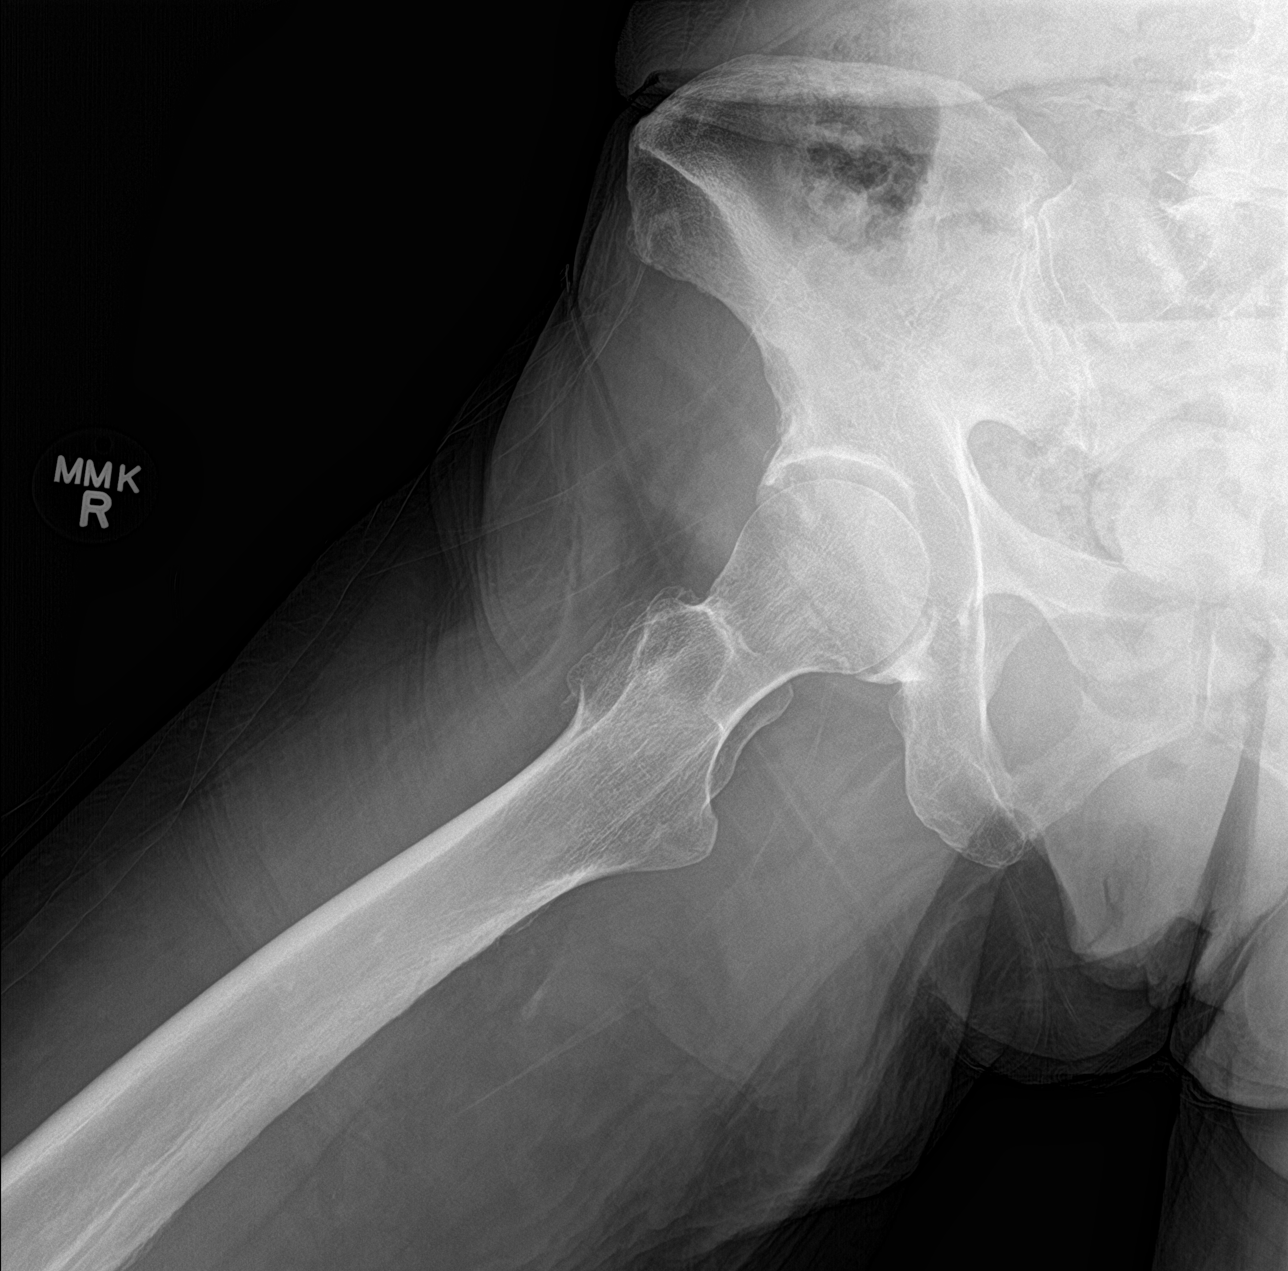

[3 of 3 positions shown; findings below may reference images not displayed]

FINDINGS: Exam demonstrates mild symmetric degenerative change of the hips. No
evidence of fracture or dislocation. Surgical clips over the left
pelvis unchanged. Moderate degenerative change of the spine.
IMPRESSION: Mild symmetric degenerative change of the hips.

## 2020-11-22 MED ORDER — MELOXICAM 15 MG PO TABS: 15.0000 mg | ORAL_TABLET | Freq: Every day | ORAL | 0 refills | Status: DC

## 2020-11-22 NOTE — Assessment & Plan Note (Signed)
No recurrences since most recent hospitalization.

## 2020-11-22 NOTE — Assessment & Plan Note (Signed)
She is down about 80 pounds over the last year or so.  Congratulated patient on weight loss and hard work.

## 2020-11-22 NOTE — Progress Notes (Signed)
   Emily Wolfe is a 65 y.o. female who presents today for an office visit.  Assessment/Plan:  New/Acute Problems: Right Hip Pain She does have some hip flexor and extensor weakness.  Also may have mild amount of trochanteric bursitis.  She also likely has underlying osteoarthritis as well.  We will start meloxicam.  Discussed home exercises and handout was given.  We will check plain film.  If not improving in the next 1 to 2 weeks will refer to physical therapy or sports medicine.  Chronic Problems Addressed Today: Osteoarthritis Could be contributing to her hip pain.  We will check plain films.  Exercises and meloxicam should help.  May need referral to PT or sports medicine if this continues to be an issue.  Class 1 obesity with serious comorbidity and body mass index (BMI) of 30.0 to 30.9 in adult She is down about 80 pounds over the last year or so.  Congratulated patient on weight loss and hard work.  SBO (small bowel obstruction) (HCC) No recurrences since most recent hospitalization.  Essential hypertension At goal per JNC 8.     Subjective:  HPI:  Around 5 years ago, she had been going to an orthopedist for a checkup in regards to her falling on her knees repeatedly. The orthopedist noted arthritis in the right femur. However, more recently this femur has developed an "air sucking pain", which has been severe and debilitating in nature. This has become increasingly frequent over the past 6 months to the point of it happening daily. She takes ibuprofen to alleviate some of the pain, but has to balance it with Duloxetine as she says that they do not work well together.  She states that that the weight loss has been intentional, and has been "working on it" over the past year. She has lost 70-80 lbs in the past year.  In regards to the surgery for bowel blockage/obstruction, she states that they mentioned "slow motility" for the condition. She has been dealing with this issue  for ~12 years.          Objective:  Physical Exam: BP (!) 148/79 (BP Location: Left Arm)   Pulse 64   Temp 98 F (36.7 C) (Temporal)   Ht 5\' 4"  (1.626 m)   Wt 131 lb 6.4 oz (59.6 kg)   SpO2 97%   BMI 22.55 kg/m   Gen: No acute distress, resting comfortably CV: Regular rate and rhythm with no murmurs appreciated Pulm: Normal work of breathing, clear to auscultation bilaterally with no crackles, wheezes, or rhonchi MSK: Bilateral lower extremities without deformities.  Normal internal and external rotation at right hip.  Tenderness to palpation along superior and posterior edge of greater trochanter.  Pain elicited with resisted right hip abduction and flexion.  Neurovascular intact distally. Neuro: Grossly normal, moves all extremities Psych: Normal affect and thought content      I,Jordan Kelly,acting as a scribe for Dimas Chyle, MD.,have documented all relevant documentation on the behalf of Dimas Chyle, MD,as directed by  Dimas Chyle, MD while in the presence of Dimas Chyle, MD.  I, Dimas Chyle, MD, have reviewed all documentation for this visit. The documentation on 11/22/20 for the exam, diagnosis, procedures, and orders are all accurate and complete.  Algis Greenhouse. Jerline Pain, MD 11/22/2020 12:32 PM

## 2020-11-22 NOTE — Assessment & Plan Note (Signed)
Could be contributing to her hip pain.  We will check plain films.  Exercises and meloxicam should help.  May need referral to PT or sports medicine if this continues to be an issue.

## 2020-11-22 NOTE — Patient Instructions (Addendum)
It was very nice to see you today!  Please work on the exercise and start the meloxicam.   Let me know if this is not improving over the next 1 to 2 weeks.  Please get the prevnar 20 at the pharmacy.  We will give your flu shot today.   Take care, Dr Jerline Pain  PLEASE NOTE:  If you had any lab tests please let us know if you have not heard back within a few days. You may see your results on mychart before we have a chance to review them but we will give you a call once they are reviewed by [place]. If we ordered any referrals today, please let us know if you have not heard from their office within the next week.   Please try these tips to maintain a healthy lifestyle:  Eat at least 3 REAL meals and 1-2 snacks per day.  Aim for no more than 5 hours between eating.  If you eat breakfast, please do so within one hour of getting up.   Each meal should contain half fruits/vegetables, one quarter protein, and one quarter carbs (no bigger than a computer mouse)  Cut down on sweet beverages. This includes juice, soda, and sweet tea.   Drink at least 1 glass of water with each meal and aim for at least 8 glasses per day  Exercise at least 150 minutes every week.

## 2020-11-22 NOTE — Assessment & Plan Note (Signed)
At goal per JNC 8.

## 2020-11-24 ENCOUNTER — Encounter (HOSPITAL_BASED_OUTPATIENT_CLINIC_OR_DEPARTMENT_OTHER): Payer: Self-pay

## 2020-11-24 ENCOUNTER — Other Ambulatory Visit: Payer: Self-pay

## 2020-11-24 ENCOUNTER — Emergency Department (HOSPITAL_BASED_OUTPATIENT_CLINIC_OR_DEPARTMENT_OTHER)
Admission: EM | Admit: 2020-11-24 | Discharge: 2020-11-24 | Disposition: A | Discharge status: Home / Self Care | Payer: Medicare Other | Attending: Emergency Medicine | Admitting: Emergency Medicine

## 2020-11-24 ENCOUNTER — Emergency Department (HOSPITAL_BASED_OUTPATIENT_CLINIC_OR_DEPARTMENT_OTHER): Payer: Medicare Other

## 2020-11-24 DIAGNOSIS — R112 Nausea with vomiting, unspecified: Secondary | ICD-10-CM | POA: Diagnosis not present

## 2020-11-24 DIAGNOSIS — Z8543 Personal history of malignant neoplasm of ovary: Secondary | ICD-10-CM | POA: Diagnosis not present

## 2020-11-24 DIAGNOSIS — I10 Essential (primary) hypertension: Secondary | ICD-10-CM | POA: Diagnosis not present

## 2020-11-24 DIAGNOSIS — R1013 Epigastric pain: Secondary | ICD-10-CM | POA: Insufficient documentation

## 2020-11-24 DIAGNOSIS — J4520 Mild intermittent asthma, uncomplicated: Secondary | ICD-10-CM | POA: Insufficient documentation

## 2020-11-24 DIAGNOSIS — R1110 Vomiting, unspecified: Secondary | ICD-10-CM | POA: Diagnosis not present

## 2020-11-24 LAB — COMPREHENSIVE METABOLIC PANEL
ALT: 10 U/L (ref 0–44)
AST: 15 U/L (ref 15–41)
Albumin: 4.3 g/dL (ref 3.5–5.0)
Alkaline Phosphatase: 61 U/L (ref 38–126)
Anion gap: 11 (ref 5–15)
BUN: 22 mg/dL (ref 8–23)
CO2: 27 mmol/L (ref 22–32)
Calcium: 9.6 mg/dL (ref 8.9–10.3)
Chloride: 102 mmol/L (ref 98–111)
Creatinine, Ser: 0.8 mg/dL (ref 0.44–1.00)
GFR, Estimated: >60 mL/min (ref >60)
Glucose, Bld: 138 mg/dL — ABNORMAL HIGH (ref 70–99)
Potassium: 3.5 mmol/L (ref 3.5–5.1)
Sodium: 140 mmol/L (ref 135–145)
Total Bilirubin: 0.7 mg/dL (ref 0.3–1.2)
Total Protein: 6.6 g/dL (ref 6.5–8.1)

## 2020-11-24 LAB — URINALYSIS, ROUTINE W REFLEX MICROSCOPIC
Bilirubin Urine: NEGATIVE
Glucose, UA: NEGATIVE mg/dL
Hgb urine dipstick: NEGATIVE
Ketones, ur: 40 mg/dL — AB
Nitrite: NEGATIVE
Protein, ur: 30 mg/dL — AB
Specific Gravity, Urine: 1.024 (ref 1.005–1.030)
pH: 6.5 (ref 5.0–8.0)

## 2020-11-24 LAB — CBC WITH DIFFERENTIAL/PLATELET
Abs Immature Granulocytes: 0.05 10*3/uL (ref 0.00–0.07)
Basophils Absolute: 0.1 10*3/uL (ref 0.0–0.1)
Basophils Relative: 1 %
Eosinophils Absolute: 0 10*3/uL (ref 0.0–0.5)
Eosinophils Relative: 0 %
HCT: 42.8 % (ref 36.0–46.0)
Hemoglobin: 14.2 g/dL (ref 12.0–15.0)
Immature Granulocytes: 1 %
Lymphocytes Relative: 2 %
Lymphs Abs: 0.2 10*3/uL — ABNORMAL LOW (ref 0.7–4.0)
MCH: 30 pg (ref 26.0–34.0)
MCHC: 33.2 g/dL (ref 30.0–36.0)
MCV: 90.5 fL (ref 80.0–100.0)
Monocytes Absolute: 0.5 10*3/uL (ref 0.1–1.0)
Monocytes Relative: 6 %
Neutro Abs: 8.9 10*3/uL — ABNORMAL HIGH (ref 1.7–7.7)
Neutrophils Relative %: 90 %
Platelets: 147 10*3/uL — ABNORMAL LOW (ref 150–400)
RBC: 4.73 MIL/uL (ref 3.87–5.11)
RDW: 13.2 % (ref 11.5–15.5)
WBC: 9.8 10*3/uL (ref 4.0–10.5)
nRBC: 0 % (ref 0.0–0.2)

## 2020-11-24 LAB — LIPASE, BLOOD: Lipase: 17 U/L (ref 11–51)

## 2020-11-24 IMAGING — DX DG ABDOMEN 1V
1 series · 2 of 2 positions shown · non-contrast
Comparison: [DATE]

CLINICAL DATA: Vomiting

EXAM:
ABDOMEN - 1 VIEW

[Series 1: abdomen · 0.14mm/px · 2 of 2 slices shown]
[im 1/2]
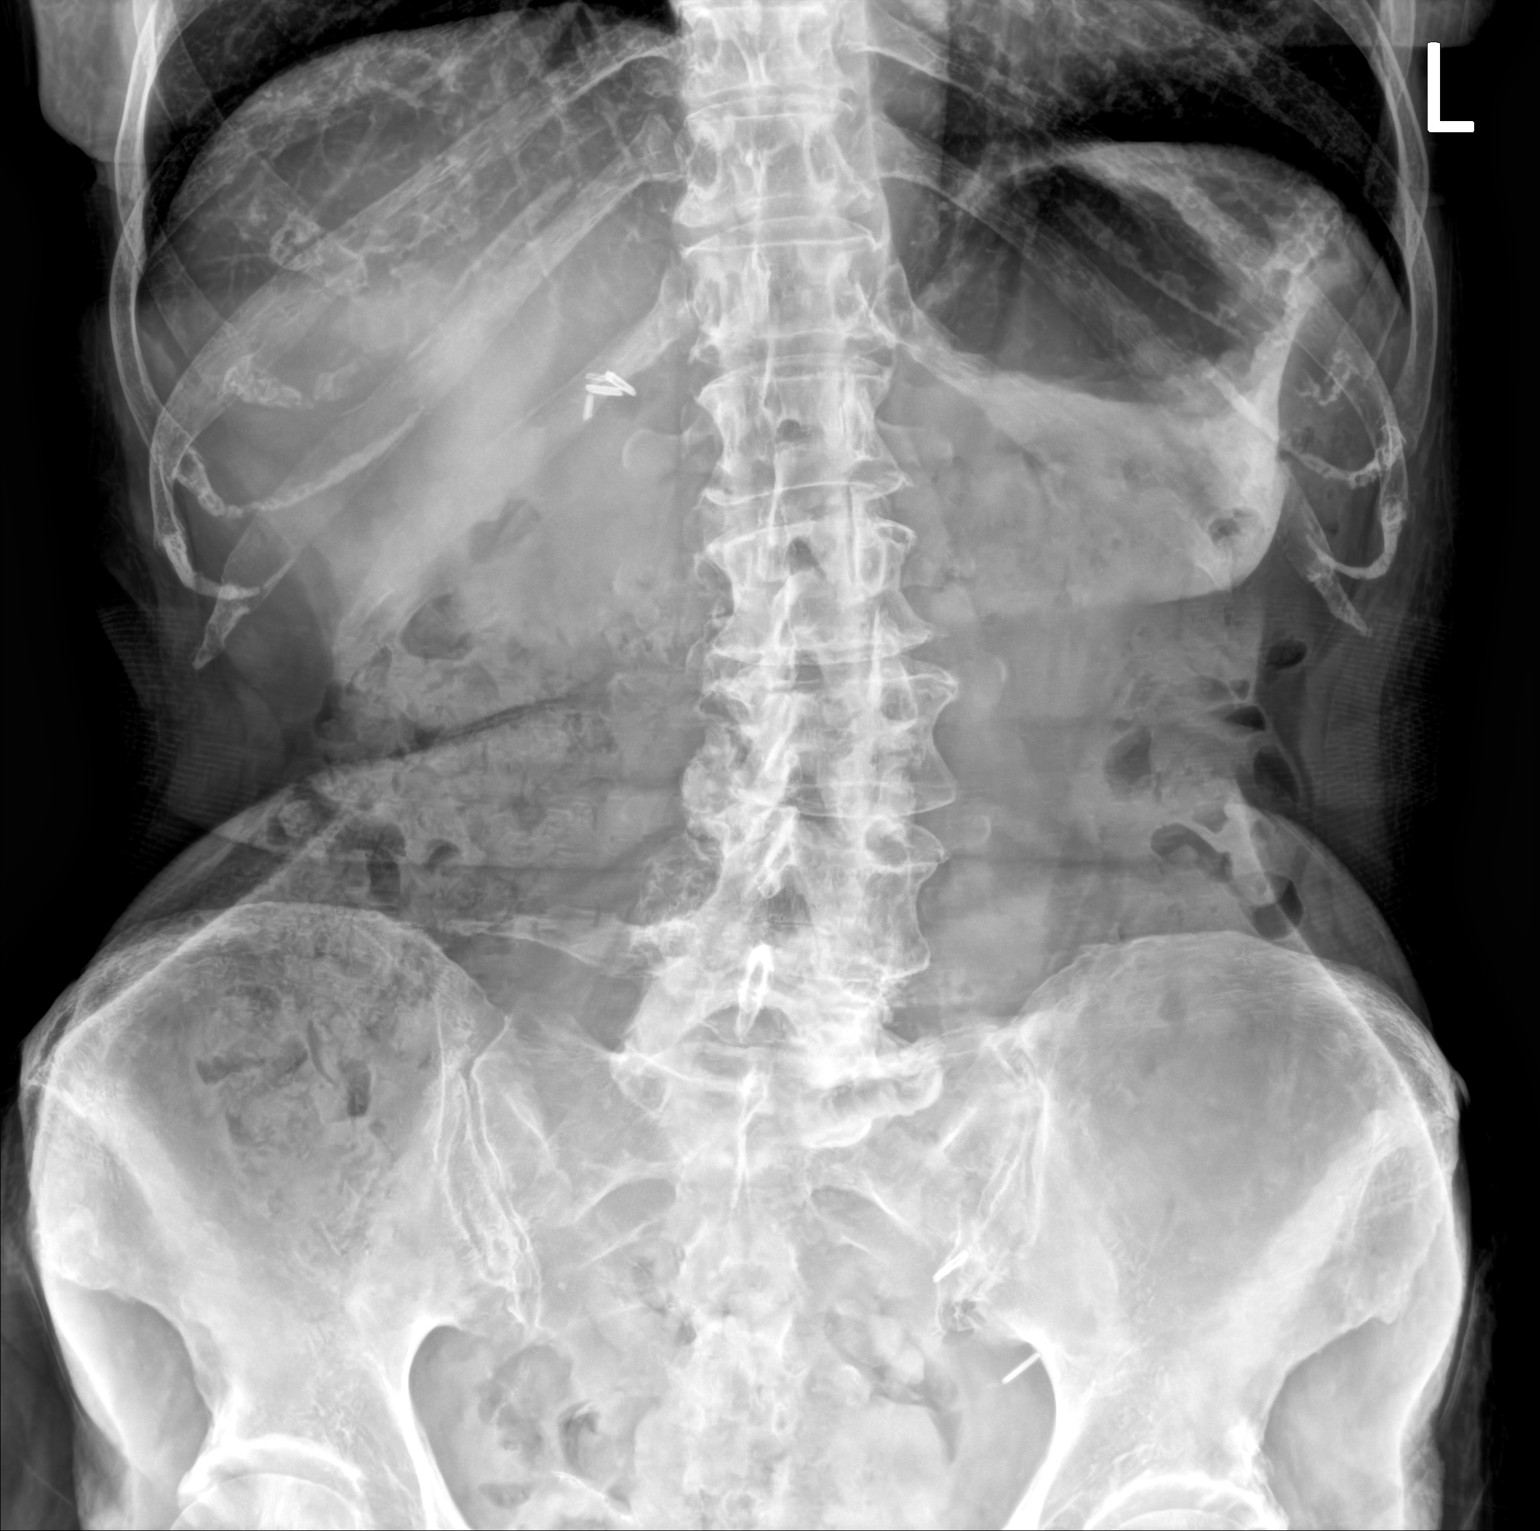
[im 2/2]
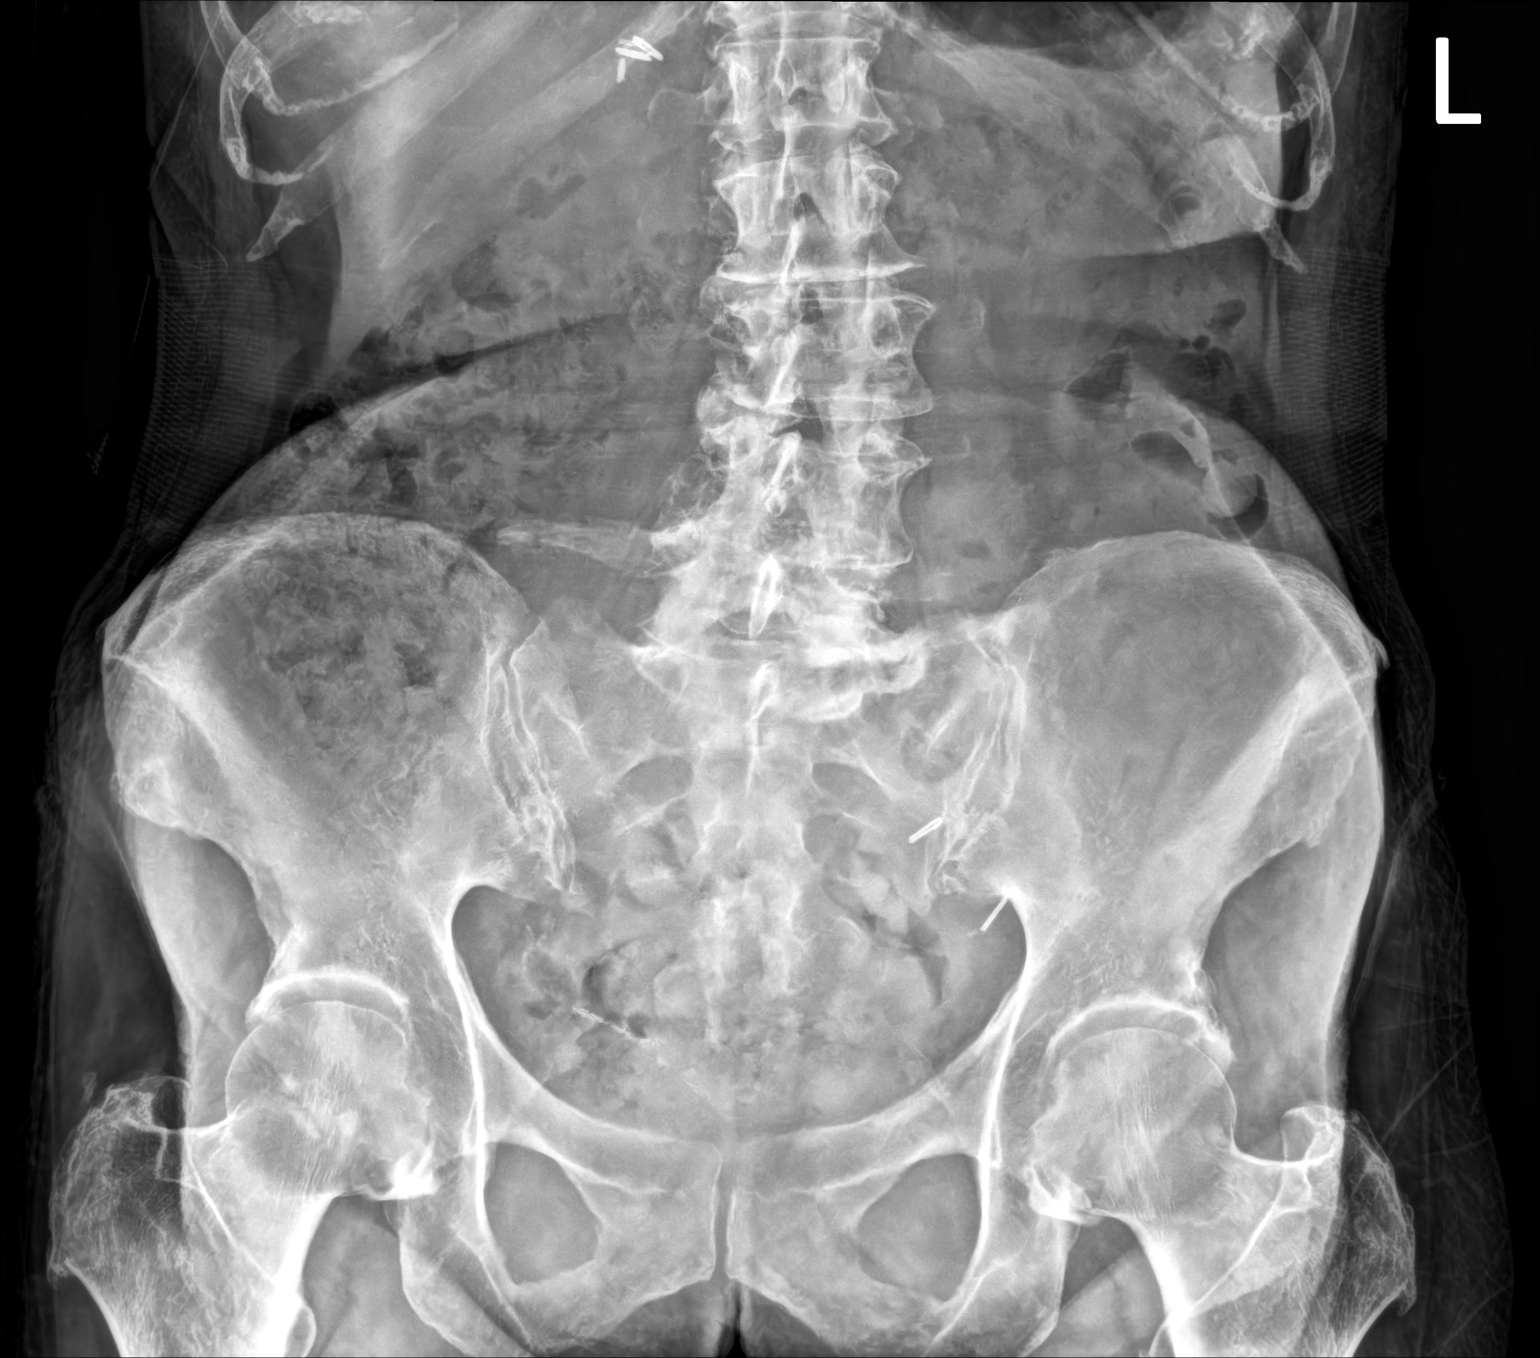

[2 of 2 positions shown; findings below may reference images not displayed]

FINDINGS: Nonobstructive bowel gas pattern. Moderate stool throughout the
colon. No free intraperitoneal gas. No organomegaly. Cholecystectomy
clips seen in the right upper quadrant. Degenerative changes within
the lumbar spine. No acute bone abnormality.
IMPRESSION: Nonobstructive bowel gas pattern.  Moderate stool.

## 2020-11-24 MED ORDER — MORPHINE SULFATE (PF) 4 MG/ML IV SOLN
4.0000 mg | Freq: Once | INTRAVENOUS | Status: AC
Start: 2020-11-24 — End: 2020-11-24
  Administered 2020-11-24: 4 mg via INTRAVENOUS
  Filled 2020-11-24: qty 1

## 2020-11-24 MED ORDER — SODIUM CHLORIDE 0.9 % IV BOLUS
1000.0000 mL | Freq: Once | INTRAVENOUS | Status: AC
  Administered 2020-11-24: 1000 mL via INTRAVENOUS

## 2020-11-24 MED ORDER — ONDANSETRON 4 MG PO TBDP: 4.0000 mg | ORAL_TABLET | Freq: Three times a day (TID) | ORAL | 0 refills | Status: DC | PRN

## 2020-11-24 MED ORDER — ONDANSETRON HCL 4 MG/2ML IJ SOLN
4.0000 mg | Freq: Once | INTRAMUSCULAR | Status: AC
  Administered 2020-11-24: 4 mg via INTRAVENOUS
  Filled 2020-11-24: qty 2

## 2020-11-24 NOTE — ED Notes (Signed)
Xray at the bedside.

## 2020-11-24 NOTE — ED Notes (Signed)
Pt verbalizes understanding of discharge instructions. Opportunity for questioning and answers were provided. Pt discharged from ED to home. Educated to pick up Rx and return if sx worsen.

## 2020-11-24 NOTE — ED Notes (Signed)
Given water and ginger ale for PO challenge. Patient tolerated well.

## 2020-11-24 NOTE — ED Provider Notes (Signed)
[facility] DEPT Provider Note   CSN: [id number] Arrival date & time: 11/24/20  0117     History Chief Complaint  Patient presents with   Abdominal Pain   Emesis    Emily Wolfe is a 65 y.o. female.  HPI     This is a 65 year old female with a history of hypertension, hyperlipidemia, cholelithiasis, SBO who presents with nausea and vomiting.  Patient reports she has had nonbilious, nonbloody emesis since 5 PM yesterday.  She also reports "nagging" epigastric pain.  It is nonradiating.  She states that the pain is different from her prior bowel obstructions.  She reports relief of her pain after emesis.  However, it comes back.  She has not taken anything for her symptoms.  She reports food association.  She reports normal bowel movement with last bowel movement yesterday evening.  Denies fevers or urinary symptoms.  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   Mild intermittent asthma without complication 15/72/6203   Multiple sclerosis ([place]) 06/29/2017   Osteoarthritis    Palpitations    Postmenopausal 06/29/2017   Scoliosis of lumbar spine 06/29/2017   Small bowel obstruction ([place]) 06/29/2017   Swallowing difficulty    Urticaria    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   Vitamin D deficiency 06/29/2017    Patient Active Problem List   Diagnosis Date Noted   Osteoarthritis 11/22/2020   Dehydration 08/31/2020   Class 1 obesity with serious comorbidity and body mass  index (BMI) of 30.0 to 30.9 in adult 01/26/2020   SBO (small bowel obstruction) ([place]) 03/21/2019   Nightmares 03/07/2019   Anxiety 12/20/2018   Cervicalgia 08/18/2018   Cough variant asthma vs UACS 01/03/2018   History of total hysterectomy, due to ovarian cancer 12/26/2017   Genital warts 11/19/2017   History of ovarian cancer 11/19/2017   Seasonal and perennial allergic rhinitis, followed by Allergist, treated with saline rinses and allergy shots 11/02/2017   Mild intermittent asthma without complication 55/97/4163   Hyperlipidemia 06/29/2017   Essential hypertension 06/29/2017   MS (multiple sclerosis) ([place]) 06/29/2017   Scoliosis of lumbar spine 06/29/2017   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   Vitamin D deficiency 06/29/2017   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   Lung mass 06/29/2017   Colon abnormality 05/11/2017    Past Surgical History:  Procedure Laterality Date   ADENOIDECTOMY     CHOLECYSTECTOMY  2009   HERNIA REPAIR     HERNIA REPAIR     6/10, 9/10   HYSTERECTOMY ABDOMINAL WITH SALPINGO-OOPHORECTOMY     HYSTEROTOMY  2008   LAPAROSCOPIC LYSIS OF ADHESIONS     , With small bowel resection and ventral hernia repair with mesh 2019/[place]   MOHS SURGERY     SINOSCOPY     TONSILLECTOMY     TYMPANOSTOMY TUBE PLACEMENT     VARICOSE  VEIN SURGERY       OB History     Gravida  0   Para  0   Term  0   Preterm  0   AB  0   Living  0      SAB  0   IAB  0   Ectopic  0   Multiple  0   Live Births  0           Family History  Problem Relation Age of Onset   Breast cancer Mother    Cancer Mother    Cervical cancer Maternal Grandmother     Social History   Tobacco Use   Smoking status: Never   Smokeless tobacco: Never  Vaping Use   Vaping Use: Never used  Substance Use Topics   Alcohol use: Not Currently   Drug use: Not Currently    Home Medications Prior to Admission medications    Medication Sig Start Date End Date Taking? Authorizing Provider  ondansetron (ZOFRAN ODT) 4 MG disintegrating tablet Take 1 tablet (4 mg total) by mouth every 8 (eight) hours as needed for nausea or vomiting. 11/24/20  Yes Gerlad Pelzel, Barbette Hair, MD  azelastine (ASTELIN) 0.1 % nasal spray PLACE 2 SPRAYS INTO BOTH NOSTRILS 2 TIMES DAILY. Patient taking differently: Place 1 spray into both nostrils daily as needed for allergies. 12/08/19   Dara Hoyer, FNP  Cholecalciferol (VITAMIN D3) 1.25 MG (50000 UT) CAPS Take 50,000 Units by mouth once a week. On Sunday 12/07/17   [provider]  DULoxetine (CYMBALTA) 60 MG capsule Take 60 mg by mouth daily.    [provider]  EPINEPHrine (AUVI-Q) 0.3 mg/0.3 mL IJ SOAJ injection Inject 0.3 mg into the muscle as needed for anaphylaxis. Patient taking differently: Inject 0.3 mg into the muscle once as needed for anaphylaxis. 01/30/20   Valentina Shaggy, MD  ipratropium (ATROVENT) 0.06 % nasal spray Place 2 sprays into both nostrils 4 (four) times daily. 01/30/20   Valentina Shaggy, MD  leflunomide (ARAVA) 10 MG tablet Take 10 mg by mouth daily.    [provider]  loratadine (CLARITIN) 10 MG tablet Take 10 mg by mouth daily.    [provider]  meloxicam (MOBIC) 15 MG tablet Take 1 tablet (15 mg total) by mouth daily. 11/22/20   Vivi Barrack, MD    Allergies    Patient has no known allergies.  Review of Systems   Review of Systems  Constitutional:  Negative for fever.  Respiratory:  Negative for shortness of breath.   Cardiovascular:  Negative for chest pain.  Gastrointestinal:  Positive for abdominal pain, nausea and vomiting. Negative for diarrhea.  Genitourinary:  Negative for dysuria.  All other systems reviewed and are negative.  Physical Exam Updated Vital Signs BP (!) 141/77   Pulse 63   Temp 97.6 F (36.4 C) (Oral)   Resp 16   Ht 1.626 m (5\' 4" )   Wt 58.1 kg   SpO2 100%   BMI 21.97 kg/m    Physical Exam Vitals and nursing note reviewed.  Constitutional:      Appearance: She is well-developed. She is not ill-appearing.  HENT:     Head: Normocephalic and atraumatic.  Eyes:     Pupils: Pupils are equal, round, and reactive to light.  Cardiovascular:     Rate and Rhythm: Normal rate and regular rhythm.     Heart sounds: Normal heart sounds.  Pulmonary:  Effort: Pulmonary effort is normal. No respiratory distress.     Breath sounds: No wheezing.  Abdominal:     General: Bowel sounds are normal.     Palpations: Abdomen is soft.     Tenderness: There is abdominal tenderness in the epigastric area. There is no guarding or rebound.  Musculoskeletal:     Cervical back: Neck supple.  Skin:    General: Skin is warm and dry.  Neurological:     Mental Status: She is alert and oriented to person, place, and time.  Psychiatric:        Mood and Affect: Mood normal.    ED Results / Procedures / Treatments   Labs (all labs ordered are listed, but only abnormal results are displayed) Labs Reviewed  URINALYSIS, ROUTINE W REFLEX MICROSCOPIC - Abnormal; Notable for the following components:      Result Value   Ketones, ur 40 (*)    Protein, ur 30 (*)    Leukocytes,Ua TRACE (*)    Bacteria, UA RARE (*)    All other components within normal limits  CBC WITH DIFFERENTIAL/PLATELET - Abnormal; Notable for the following components:   Platelets 147 (*)    Neutro Abs 8.9 (*)    Lymphs Abs 0.2 (*)    All other components within normal limits  COMPREHENSIVE METABOLIC PANEL - Abnormal; Notable for the following components:   Glucose, Bld 138 (*)    All other components within normal limits  LIPASE, BLOOD  CBC WITH DIFFERENTIAL/PLATELET    EKG None  Radiology DG Abdomen 1 View  Result Date: 11/24/2020 CLINICAL DATA:  Vomiting EXAM: ABDOMEN - 1 VIEW COMPARISON:  09/01/2020 FINDINGS: Nonobstructive bowel gas pattern. Moderate stool throughout the colon. No free  intraperitoneal gas. No organomegaly. Cholecystectomy clips seen in the right upper quadrant. Degenerative changes within the lumbar spine. No acute bone abnormality. IMPRESSION: Nonobstructive bowel gas pattern.  Moderate stool. Electronically Signed   By: Fidela Salisbury M.D.   On: 11/24/2020 02:05   DG HIP UNILAT W OR W/O PELVIS 2-3 VIEWS RIGHT  Result Date: 11/23/2020 CLINICAL DATA:  Right hip pain 6 months.  No injury. EXAM: DG HIP (WITH OR WITHOUT PELVIS) 2-3V RIGHT COMPARISON:  KUB 03/22/2019 FINDINGS: Exam demonstrates mild symmetric degenerative change of the hips. No evidence of fracture or dislocation. Surgical clips over the left pelvis unchanged. Moderate degenerative change of the spine. IMPRESSION: Mild symmetric degenerative change of the hips. Electronically Signed   By: Marin Olp M.D.   On: 11/23/2020 11:48    Procedures Procedures   Medications Ordered in ED Medications  sodium chloride 0.9 % bolus 1,000 mL (0 mLs Intravenous Stopped 11/24/20 0331)  morphine 4 MG/ML injection 4 mg (4 mg Intravenous Given 11/24/20 0210)  ondansetron (ZOFRAN) injection 4 mg (4 mg Intravenous Given 11/24/20 0210)    ED Course  I have reviewed the triage vital signs and the nursing notes.  Pertinent labs & imaging results that were available during my care of the patient were reviewed by me and considered in my medical decision making (see chart for details).    MDM Rules/Calculators/A&P                           Patient presents with epigastric pain and nausea and vomiting.  She is nontoxic and vital signs are reassuring.  She has a history of SBO but states that this feels different.  She is concerned she may  have food poisoning.  She does have some mild epigastric tenderness to palpation.  Other considerations include but not limited to gallbladder pathology, pancreatitis, gastritis.  Patient was given fluids and pain and nausea medication.  She would like to avoid CT imaging if at all  possible.  Labs obtained.  Labs are largely reassuring.  No LFT derangements.  Lipase is normal.  No metabolic derangements and no leukocytosis.  Urinalysis without evidence of UTI.  Plain film of the abdomen without obvious obstructive process.  Patient is having bowel movements.  I long discussion with the patient.  She states she feels somewhat better.  She does not feel this is consistent with her prior obstructive symptoms.  Discussed with her that we would manage her symptoms symptomatically.  However, given her history, I would have low threshold for return if she has worsening symptoms for CT imaging.  Patient stated understanding.  This was shared clinical decision making.  After history, exam, and medical workup I feel the patient has been appropriately medically screened and is safe for discharge home. Pertinent diagnoses were discussed with the patient. Patient was given return precautions.  Final Clinical Impression(s) / ED Diagnoses Final diagnoses:  Epigastric pain    Rx / DC Orders ED Discharge Orders          Ordered    ondansetron (ZOFRAN ODT) 4 MG disintegrating tablet  Every 8 hours PRN        11/24/20 0533             Merryl Hacker, MD 11/24/20 [id number]

## 2020-11-24 NOTE — Discharge Instructions (Signed)
You were seen today for abdominal pain and vomiting.  Your work-up today is reassuring.  Given your history of bowel obstruction, monitor your symptoms closely.  If you have worsening pain, ongoing nausea and vomiting, you should be reassessed immediately.

## 2020-11-24 NOTE — ED Triage Notes (Signed)
Pt is present with upper abd pain with N/V since 1700 last night. Pt has a history of SBO but this pain feels different. Pt states the pain is higher up but gets relief from vomiting, but it will shortly return. Pt states it may have been from old food but unsure. Last normal BM last night.

## 2020-11-24 NOTE — ED Notes (Signed)
Resent labs

## 2020-11-25 NOTE — Progress Notes (Signed)
Please inform patient of the following:  She has mild arthritis in her hips. This could be causing some of her pain. Would like for her to let us know if her pain is not improving and we can refer her to see PT or sports medicine.  Algis Greenhouse. Jerline Pain, MD 11/25/2020 8:04 AM

## 2020-11-26 ENCOUNTER — Telehealth: Payer: Self-pay

## 2020-11-26 NOTE — Telephone Encounter (Signed)
Pt called regarding x-ray results. Please Advise.

## 2020-11-26 NOTE — Telephone Encounter (Signed)
Tried calling patient, left vm to return my call.

## 2020-11-27 NOTE — Telephone Encounter (Signed)
Returned call to patient. X-ray results given to patient. Patient verbalized understanding.

## 2020-12-06 ENCOUNTER — Ambulatory Visit (INDEPENDENT_AMBULATORY_CARE_PROVIDER_SITE_OTHER): Payer: Medicare Other

## 2020-12-06 DIAGNOSIS — J309 Allergic rhinitis, unspecified: Secondary | ICD-10-CM | POA: Diagnosis not present

## 2020-12-13 ENCOUNTER — Ambulatory Visit (INDEPENDENT_AMBULATORY_CARE_PROVIDER_SITE_OTHER): Payer: Medicare Other

## 2020-12-13 DIAGNOSIS — J309 Allergic rhinitis, unspecified: Secondary | ICD-10-CM

## 2020-12-19 ENCOUNTER — Ambulatory Visit (INDEPENDENT_AMBULATORY_CARE_PROVIDER_SITE_OTHER): Payer: Medicare Other

## 2020-12-19 DIAGNOSIS — J309 Allergic rhinitis, unspecified: Secondary | ICD-10-CM

## 2020-12-21 ENCOUNTER — Other Ambulatory Visit: Payer: Self-pay | Admitting: Family Medicine

## 2020-12-23 LAB — HM HEPATITIS C SCREENING LAB: HM Hepatitis Screen: NEGATIVE

## 2020-12-25 ENCOUNTER — Telehealth: Payer: Self-pay

## 2020-12-25 DIAGNOSIS — M25551 Pain in right hip: Secondary | ICD-10-CM

## 2020-12-25 NOTE — Telephone Encounter (Signed)
Spoke to pt told her Rx for Meloxicam was sent to pharmacy on 12/19. Pt verbalized understanding.  Told her according to las visit Dr. Jerline Pain said we can send you to Sports Medicine or PT, which would you like? Pt said what is Sports Medicine? Told her they will evaluate your hip and decide what is the best treatment for you where as PT they will do exercises with the hip. Pt said lets go with Sports Medicine. Told her okay I will send referral and someone will contact you to schedule appt. Pt verbalized understanding. Referral placed in Epic.

## 2020-12-25 NOTE — Telephone Encounter (Signed)
Would like a refill on the Meloxican as it is helping some and she is out.Also was asking about the physical therapy referral that was discussed with Dr. Jerline Pain during her last visit.

## 2020-12-27 ENCOUNTER — Other Ambulatory Visit: Payer: Self-pay

## 2020-12-27 ENCOUNTER — Ambulatory Visit: Payer: Medicare Other | Admitting: Family Medicine

## 2020-12-27 ENCOUNTER — Ambulatory Visit: Payer: Self-pay

## 2020-12-27 VITALS — BP 138/92 | HR 64 | Ht 64.0 in | Wt 132.0 lb

## 2020-12-27 DIAGNOSIS — G8929 Other chronic pain: Secondary | ICD-10-CM | POA: Diagnosis not present

## 2020-12-27 DIAGNOSIS — M7061 Trochanteric bursitis, right hip: Secondary | ICD-10-CM | POA: Diagnosis not present

## 2020-12-27 DIAGNOSIS — M25551 Pain in right hip: Secondary | ICD-10-CM | POA: Diagnosis not present

## 2020-12-27 DIAGNOSIS — M1611 Unilateral primary osteoarthritis, right hip: Secondary | ICD-10-CM | POA: Diagnosis not present

## 2020-12-27 NOTE — Patient Instructions (Addendum)
Thank you for coming in today.   I've referred you to Physical Therapy.  Let us know if you don't hear from them in one week.   Recheck back in 6 weeks   

## 2020-12-27 NOTE — Progress Notes (Signed)
° °  I, [name], LAT, ATC acting as a scribe for [name], MD.  Subjective:    CC: R hip pain  HPI: Pt is a 65 y/o female c/o R hip pain x 6 months. Pt has been ignoring pain, but now it's become constant. Pt locates pain to lateral aspect of R hip w/ radiating pain into the anterior aspect of the R thigh. Pt has a hx of MS and neuropathy.  Low back pain: no Radiates: yes Mechanical symptoms: no LE Numbness/tingling: no LE Weakness: no Aggravates: standing, walking dog- dog pulling leash Treatments tried: Meloxicam, OTC  Dx imaging: 11/22/20 R hip XR  Pertinent review of Systems: No fevers or chills  Relevant historical information: MS.  History of ovarian cancer.  Multiple abdominal surgeries with adhesions and history of small bowel obstruction.   Objective:    Vitals:   12/27/20 1052  BP: (!) 138/92  Pulse: 64  SpO2: 98%   General: Well Developed, well nourished, and in no acute distress.   MSK: Right hip normal-appearing Range of motion slight reduced flexion and internal rotation with mild pain. Tender palpation greater trochanter. Hip abduction strength is reduced 4/5.  External rotation strength is reduced 4/5.  Otherwise strength is intact.  Left hip normal-appearing normal motion nontender external rotation and abduction strength is reduced to 4/5 for both.  Leg lengths are equal. Normal gait.  Lab and Radiology Results EXAM: DG HIP (WITH OR WITHOUT PELVIS) 2-3V RIGHT   COMPARISON:  KUB 03/22/2019   FINDINGS: Exam demonstrates mild symmetric degenerative change of the hips. No evidence of fracture or dislocation. Surgical clips over the left pelvis unchanged. Moderate degenerative change of the spine.   IMPRESSION: Mild symmetric degenerative change of the hips.     Electronically Signed   By: Marin Olp M.D.   On: 11/23/2020 11:48 I, [name], personally (independently) visualized and performed the interpretation of the images  attached in this note.     Impression and Recommendations:    Assessment and Plan: 65 y.o. female with right lateral hip pain with some anterior hip pain component.  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  She is an excellent candidate for physical therapy.  Based on where she lives will refer to [facility] physical therapy in [place].  Check back in 6 weeks.  Return or recheck sooner if needed.  She does have an anterior hip pain component thought to be due to the mild DJD seen on x-ray.  Consider diagnostic and therapeutic injection in the future if needed.Marland Kitchen  PDMP not reviewed this encounter. Orders Placed This Encounter  Procedures   Ambulatory referral to Physical Therapy    Referral Priority:   Routine    Referral Type:   Physical Medicine    Referral Reason:   Specialty Services Required    Requested Specialty:   Physical Therapy    Number of Visits Requested:   1   No orders of the defined types were placed in this encounter.   Discussed warning signs or symptoms. Please see discharge instructions. Patient expresses understanding.   The above documentation has been reviewed and is accurate and complete [name], M.D.

## 2021-01-08 DIAGNOSIS — G8929 Other chronic pain: Secondary | ICD-10-CM | POA: Diagnosis not present

## 2021-01-08 DIAGNOSIS — M25551 Pain in right hip: Secondary | ICD-10-CM | POA: Diagnosis not present

## 2021-01-08 DIAGNOSIS — M6281 Muscle weakness (generalized): Secondary | ICD-10-CM | POA: Diagnosis not present

## 2021-01-13 DIAGNOSIS — M6281 Muscle weakness (generalized): Secondary | ICD-10-CM | POA: Diagnosis not present

## 2021-01-13 DIAGNOSIS — G8929 Other chronic pain: Secondary | ICD-10-CM | POA: Diagnosis not present

## 2021-01-13 DIAGNOSIS — M25551 Pain in right hip: Secondary | ICD-10-CM | POA: Diagnosis not present

## 2021-01-15 DIAGNOSIS — M6281 Muscle weakness (generalized): Secondary | ICD-10-CM | POA: Diagnosis not present

## 2021-01-15 DIAGNOSIS — G8929 Other chronic pain: Secondary | ICD-10-CM | POA: Diagnosis not present

## 2021-01-15 DIAGNOSIS — M25551 Pain in right hip: Secondary | ICD-10-CM | POA: Diagnosis not present

## 2021-01-17 ENCOUNTER — Ambulatory Visit (INDEPENDENT_AMBULATORY_CARE_PROVIDER_SITE_OTHER): Payer: Medicare Other

## 2021-01-17 DIAGNOSIS — J309 Allergic rhinitis, unspecified: Secondary | ICD-10-CM | POA: Diagnosis not present

## 2021-01-20 DIAGNOSIS — M6281 Muscle weakness (generalized): Secondary | ICD-10-CM | POA: Diagnosis not present

## 2021-01-20 DIAGNOSIS — G8929 Other chronic pain: Secondary | ICD-10-CM | POA: Diagnosis not present

## 2021-01-20 DIAGNOSIS — M25551 Pain in right hip: Secondary | ICD-10-CM | POA: Diagnosis not present

## 2021-01-21 ENCOUNTER — Other Ambulatory Visit: Payer: Self-pay | Admitting: Family Medicine

## 2021-01-22 DIAGNOSIS — M25551 Pain in right hip: Secondary | ICD-10-CM | POA: Diagnosis not present

## 2021-01-22 DIAGNOSIS — M6281 Muscle weakness (generalized): Secondary | ICD-10-CM | POA: Diagnosis not present

## 2021-01-22 DIAGNOSIS — G8929 Other chronic pain: Secondary | ICD-10-CM | POA: Diagnosis not present

## 2021-01-23 ENCOUNTER — Ambulatory Visit (INDEPENDENT_AMBULATORY_CARE_PROVIDER_SITE_OTHER): Payer: Medicare Other

## 2021-01-23 DIAGNOSIS — J309 Allergic rhinitis, unspecified: Secondary | ICD-10-CM

## 2021-01-24 ENCOUNTER — Other Ambulatory Visit: Payer: Self-pay

## 2021-01-24 ENCOUNTER — Ambulatory Visit (INDEPENDENT_AMBULATORY_CARE_PROVIDER_SITE_OTHER): Payer: Medicare Other | Admitting: Family Medicine

## 2021-01-24 ENCOUNTER — Encounter: Payer: Self-pay | Admitting: Family Medicine

## 2021-01-24 VITALS — BP 130/80 | HR 78 | Temp 98.0°F | Ht 64.0 in | Wt 135.8 lb

## 2021-01-24 DIAGNOSIS — I10 Essential (primary) hypertension: Secondary | ICD-10-CM

## 2021-01-24 DIAGNOSIS — M1990 Unspecified osteoarthritis, unspecified site: Secondary | ICD-10-CM

## 2021-01-24 DIAGNOSIS — G35 Multiple sclerosis: Secondary | ICD-10-CM | POA: Diagnosis not present

## 2021-01-24 DIAGNOSIS — R42 Dizziness and giddiness: Secondary | ICD-10-CM

## 2021-01-24 LAB — CBC
HCT: 40.2 % (ref 36.0–46.0)
Hemoglobin: 13 g/dL (ref 12.0–15.0)
MCHC: 32.3 g/dL (ref 30.0–36.0)
MCV: 90.9 fl (ref 78.0–100.0)
Platelets: 143 10*3/uL — ABNORMAL LOW (ref 150.0–400.0)
RBC: 4.42 Mil/uL (ref 3.87–5.11)
RDW: 14.2 % (ref 11.5–15.5)
WBC: 4.5 10*3/uL (ref 4.0–10.5)

## 2021-01-24 LAB — COMPREHENSIVE METABOLIC PANEL
ALT: 10 U/L (ref 0–35)
AST: 13 U/L (ref 0–37)
Albumin: 4 g/dL (ref 3.5–5.2)
Alkaline Phosphatase: 63 U/L (ref 39–117)
BUN: 29 mg/dL — ABNORMAL HIGH (ref 6–23)
CO2: 30 mEq/L (ref 19–32)
Calcium: 9.1 mg/dL (ref 8.4–10.5)
Chloride: 111 mEq/L (ref 96–112)
Creatinine, Ser: 0.72 mg/dL (ref 0.40–1.20)
GFR: 87.73 mL/min (ref >60.00)
Glucose, Bld: 61 mg/dL — ABNORMAL LOW (ref 70–99)
Potassium: 4.2 mEq/L (ref 3.5–5.1)
Sodium: 145 mEq/L (ref 135–145)
Total Bilirubin: 0.3 mg/dL (ref 0.2–1.2)
Total Protein: 6.2 g/dL (ref 6.0–8.3)

## 2021-01-24 LAB — URINALYSIS, ROUTINE W REFLEX MICROSCOPIC
Bilirubin Urine: NEGATIVE
Hgb urine dipstick: NEGATIVE
Ketones, ur: NEGATIVE
Leukocytes,Ua: NEGATIVE
Nitrite: NEGATIVE
Specific Gravity, Urine: >=1.03 — AB (ref 1.000–1.030)
Total Protein, Urine: NEGATIVE
Urine Glucose: NEGATIVE
Urobilinogen, UA: 0.2 (ref 0.0–1.0)
pH: 5.5 (ref 5.0–8.0)

## 2021-01-24 LAB — TSH: TSH: 1.52 u[IU]/mL (ref 0.35–5.50)

## 2021-01-24 NOTE — Assessment & Plan Note (Signed)
Not on any medications.  At goal today.

## 2021-01-24 NOTE — Assessment & Plan Note (Signed)
She follows with neurology.  She has not had any recent flares.  It is possible that this could be contributing some to her feelings of dizziness that we will pursue work-up above first.

## 2021-01-24 NOTE — Progress Notes (Signed)
Please inform patient of the following:  Her blood sugar is a bit a low and her labs indicated that she is probably dehydrated. I think these are contributing to her symptoms.  Like we discussed at her office visit I would like for her to really increase her fluids. She should be getting at least 8 glasses of water per day.   She should also aim to eat within an hour of waking up and after going more than 3 to 4 hours without eating.  Would like for her let us know if her symptoms are not improving over the next 1 to 2 weeks.

## 2021-01-24 NOTE — Progress Notes (Signed)
Emily Wolfe is a 66 y.o. female who presents today for an office visit.  Assessment/Plan:  New/Acute Problems: Dizziness Likely due to dehydration.  It sounds as if she may have had a syncopal event several weeks ago however the history around this is not clear.  Given her daily morning symptoms that resolve throughout the day, do not think this represents arrhythmia.  Her cardiovascular exam today is reassuring.  She does not have any palpitations or chest pain or any other red flags.  No vertigo symptoms.she does have a history of MS but has not had any recent flares.  This could potentially be contributing though we will rule out other causes first.  We discussed obtaining echocardiogram versus referral to cardiology however she would like to defer for now.  She will increase fluid intake.  We will check labs and UA.  If symptoms persist and cardiac work-up is nondiagnostic will need referral to cardiology.  We discussed reasons return to care.  Chronic Problems Addressed Today: Osteoarthritis Doing much better with PT.   MS (multiple sclerosis) ([place]) She follows with neurology.  She has not had any recent flares.  It is possible that this could be contributing some to her feelings of dizziness that we will pursue work-up above first.  Essential hypertension Not on any medications.  At goal today.     Subjective:  HPI:  See A/p for status of chronic conditions.   Patient here with lightheadedness and dizziness.  This started 3 weeks ago. Happens as soon as she wakes up. Lasts until early afternoon and then it goes away.  No room spinning sensation. No recent medications aside from meloxicam. She passed out several weeks. Patient not remember exactly what happened. She was in the kitchen and hit the floor. She regained consciousness immediately. Did not suffer any injuries. No issues with syncope since then. She had labs with insurance recently that were reportedly normal.   She  has very faint symptoms currently. Much worse first thing in the morning. Worse with sitting up or standing up too fast. No palpitations or chest pain. She feels like she is getting plenty of fluids and does not think that she is dehydrated. She drinks quite a bit of caffeine.   She has not seen a cardiologist in several years.  She saw them about 40 years ago due to longstanding history of mitral valve prolapse.  This has not been an issue for her.  She has also had issues with hypertension in the past but has been able to come off all of her medications since losing weight.  Her blood pressures have typically been well controlled without meds.       Objective:  Physical Exam: BP 130/80 (BP Location: Right Arm, Patient Position: Sitting, Cuff Size: Normal)    Pulse 78    Temp 98 F (36.7 C)    Ht 5\' 4"  (1.626 m)    Wt 135 lb 12.8 oz (61.6 kg)    SpO2 100%    BMI 23.31 kg/m   Gen: No acute distress, resting comfortably CV: Regular rate and rhythm with no murmurs appreciated Pulm: Normal work of breathing, clear to auscultation bilaterally with no crackles, wheezes, or rhonchi Neuro: Grossly normal, moves all extremities Psych: Normal affect and thought content  Time Spent: 45 minutes of total time was spent on the date of the encounter performing the following actions: chart review prior to seeing the patient including recent specialist visits, obtaining history, performing  a medically necessary exam, counseling on the treatment plan, placing orders, and documenting in our EHR.        Algis Greenhouse. Jerline Pain, MD 01/24/2021 9:53 AM

## 2021-01-24 NOTE — Assessment & Plan Note (Signed)
Doing much better with PT.

## 2021-01-24 NOTE — Patient Instructions (Signed)
It was very nice to see you today!  I think you are probably dehydrated.  Please try to increase your water intake and get at least 8 glasses of water daily.  We will check blood work and urine sample.  Please let us know if your symptoms worsen.  We may need to have you see a cardiologist or get an echocardiogram if your symptoms are not improving.  Take care, Dr Jerline Pain  PLEASE NOTE:  If you had any lab tests please let us know if you have not heard back within a few days. You may see your results on mychart before we have a chance to review them but we will give you a call once they are reviewed by [place]. If we ordered any referrals today, please let us know if you have not heard from their office within the next week.   Please try these tips to maintain a healthy lifestyle:  Eat at least 3 REAL meals and 1-2 snacks per day.  Aim for no more than 5 hours between eating.  If you eat breakfast, please do so within one hour of getting up.   Each meal should contain half fruits/vegetables, one quarter protein, and one quarter carbs (no bigger than a computer mouse)  Cut down on sweet beverages. This includes juice, soda, and sweet tea.   Drink at least 1 glass of water with each meal and aim for at least 8 glasses per day  Exercise at least 150 minutes every week.

## 2021-01-27 DIAGNOSIS — M25551 Pain in right hip: Secondary | ICD-10-CM | POA: Diagnosis not present

## 2021-01-27 DIAGNOSIS — G8929 Other chronic pain: Secondary | ICD-10-CM | POA: Diagnosis not present

## 2021-01-27 DIAGNOSIS — M6281 Muscle weakness (generalized): Secondary | ICD-10-CM | POA: Diagnosis not present

## 2021-01-28 ENCOUNTER — Ambulatory Visit: Payer: Self-pay | Admitting: Allergy & Immunology

## 2021-01-29 ENCOUNTER — Telehealth: Payer: Self-pay

## 2021-01-29 DIAGNOSIS — M6281 Muscle weakness (generalized): Secondary | ICD-10-CM | POA: Diagnosis not present

## 2021-01-29 DIAGNOSIS — G8929 Other chronic pain: Secondary | ICD-10-CM | POA: Diagnosis not present

## 2021-01-29 DIAGNOSIS — M25551 Pain in right hip: Secondary | ICD-10-CM | POA: Diagnosis not present

## 2021-01-29 NOTE — Telephone Encounter (Signed)
Patient wanted Dr. Jerline Pain to know that she is increasing her water intake. She also has been having some nose bleeds and wanted to make him awarel

## 2021-01-30 ENCOUNTER — Ambulatory Visit (INDEPENDENT_AMBULATORY_CARE_PROVIDER_SITE_OTHER): Payer: Medicare Other

## 2021-01-30 DIAGNOSIS — J309 Allergic rhinitis, unspecified: Secondary | ICD-10-CM | POA: Diagnosis not present

## 2021-01-31 NOTE — Telephone Encounter (Signed)
Pt notified and voiced understanding 

## 2021-01-31 NOTE — Telephone Encounter (Signed)
I am glad she is drinking more water!   She can try using nasal saline to help if she wishes.  Algis Greenhouse. Jerline Pain, MD 01/31/2021 4:00 PM

## 2021-02-03 DIAGNOSIS — G8929 Other chronic pain: Secondary | ICD-10-CM | POA: Diagnosis not present

## 2021-02-03 DIAGNOSIS — M25551 Pain in right hip: Secondary | ICD-10-CM | POA: Diagnosis not present

## 2021-02-03 DIAGNOSIS — M6281 Muscle weakness (generalized): Secondary | ICD-10-CM | POA: Diagnosis not present

## 2021-02-05 DIAGNOSIS — M6281 Muscle weakness (generalized): Secondary | ICD-10-CM | POA: Diagnosis not present

## 2021-02-05 DIAGNOSIS — G8929 Other chronic pain: Secondary | ICD-10-CM | POA: Diagnosis not present

## 2021-02-05 DIAGNOSIS — M25551 Pain in right hip: Secondary | ICD-10-CM | POA: Diagnosis not present

## 2021-02-07 ENCOUNTER — Ambulatory Visit: Payer: Medicare Other | Admitting: Family Medicine

## 2021-02-07 ENCOUNTER — Other Ambulatory Visit: Payer: Self-pay

## 2021-02-07 ENCOUNTER — Ambulatory Visit (INDEPENDENT_AMBULATORY_CARE_PROVIDER_SITE_OTHER): Payer: Medicare Other | Admitting: *Deleted

## 2021-02-07 VITALS — BP 138/88 | HR 61 | Ht 64.0 in | Wt 137.4 lb

## 2021-02-07 DIAGNOSIS — M1611 Unilateral primary osteoarthritis, right hip: Secondary | ICD-10-CM | POA: Diagnosis not present

## 2021-02-07 DIAGNOSIS — M25551 Pain in right hip: Secondary | ICD-10-CM | POA: Diagnosis not present

## 2021-02-07 DIAGNOSIS — M7061 Trochanteric bursitis, right hip: Secondary | ICD-10-CM | POA: Diagnosis not present

## 2021-02-07 DIAGNOSIS — J309 Allergic rhinitis, unspecified: Secondary | ICD-10-CM | POA: Diagnosis not present

## 2021-02-07 DIAGNOSIS — G8929 Other chronic pain: Secondary | ICD-10-CM

## 2021-02-07 NOTE — Progress Notes (Signed)
° °  I, [name], LAT, ATC acting as a scribe for [name], MD.  Emily Wolfe is a 66 y.o. 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 Pt has a hx of MS and neuropathy. Pt was last seen by Dr. Georgina Snell on 12/27/20 and was referred to [facility] PT, completed a full month of visits. Today, pt reports R hip pain has improved. She notes that the sharp pains had gone away, but over this past week have returned. Pt notes improvement w/ AROM. Pt would like to be able to stop taking the Meloxicam.  Dx imaging: 11/22/20 R hip XR  Pertinent review of systems: No fevers or chills  Relevant historical information: Hypertension.  MS.  History of small bowel obstruction.   Exam:  BP 138/88    Pulse 61    Ht 5\' 4"  (1.626 m)    Wt 137 lb 6.4 oz (62.3 kg)    SpO2 99%    BMI 23.58 kg/m  General: Well Developed, well nourished, and in no acute distress.   MSK: Right hip normal-appearing Normal motion. Minimally tender greater trochanter. Hip abduction and external rotation strength are diminished 4/5.  Left hip normal motion  Tender. Hip abduction and external rotation strength are diminished 4/5.    Lab and Radiology Results EXAM: DG HIP (WITH OR WITHOUT PELVIS) 2-3V RIGHT   COMPARISON:  KUB 03/22/2019   FINDINGS: Exam demonstrates mild symmetric degenerative change of the hips. No evidence of fracture or dislocation. Surgical clips over the left pelvis unchanged. Moderate degenerative change of the spine.   IMPRESSION: Mild symmetric degenerative change of the hips.     Electronically Signed   By: Marin Olp M.D.   On: 11/23/2020 11:48 I, [name], personally (independently) visualized and performed the interpretation of the images attached in this note.     Assessment and Plan: 66 y.o. female with right lateral hip pain due to hip abductor  tendinopathy and anterior hip pain thought to be due to potential hip impingement or labral tear.  Patient has improved some with physical therapy.  Plan to finish out physical therapy and continued home exercise program.  Reassess in about 6 weeks.  If not improved consider MRI arthrogram right hip.   Discussed warning signs or symptoms. Please see discharge instructions. Patient expresses understanding.   The above documentation has been reviewed and is accurate and complete [name], M.D.

## 2021-02-07 NOTE — Patient Instructions (Addendum)
Thank you for coming in today.   Continue home exercises and physical therapy  Recheck back in 6 weeks

## 2021-02-10 ENCOUNTER — Ambulatory Visit (HOSPITAL_BASED_OUTPATIENT_CLINIC_OR_DEPARTMENT_OTHER): Payer: Medicare Other | Attending: Emergency Medicine

## 2021-02-10 ENCOUNTER — Other Ambulatory Visit: Payer: Self-pay

## 2021-02-10 ENCOUNTER — Encounter (HOSPITAL_BASED_OUTPATIENT_CLINIC_OR_DEPARTMENT_OTHER): Payer: Self-pay | Admitting: Urology

## 2021-02-10 ENCOUNTER — Emergency Department (HOSPITAL_BASED_OUTPATIENT_CLINIC_OR_DEPARTMENT_OTHER)
Admission: EM | Admit: 2021-02-10 | Disposition: A | Discharge status: Home / Self Care | Payer: Medicare Other | Source: Home / Self Care | Attending: Emergency Medicine | Admitting: Emergency Medicine

## 2021-02-10 DIAGNOSIS — R1032 Left lower quadrant pain: Secondary | ICD-10-CM | POA: Diagnosis not present

## 2021-02-10 DIAGNOSIS — Z20822 Contact with and (suspected) exposure to covid-19: Secondary | ICD-10-CM | POA: Diagnosis not present

## 2021-02-10 DIAGNOSIS — K56609 Unspecified intestinal obstruction, unspecified as to partial versus complete obstruction: Secondary | ICD-10-CM | POA: Diagnosis not present

## 2021-02-10 DIAGNOSIS — M6281 Muscle weakness (generalized): Secondary | ICD-10-CM | POA: Diagnosis not present

## 2021-02-10 DIAGNOSIS — M25551 Pain in right hip: Secondary | ICD-10-CM | POA: Diagnosis not present

## 2021-02-10 DIAGNOSIS — G8929 Other chronic pain: Secondary | ICD-10-CM | POA: Diagnosis not present

## 2021-02-10 LAB — COMPREHENSIVE METABOLIC PANEL
ALT: 12 U/L (ref 0–44)
AST: 17 U/L (ref 15–41)
Albumin: 4.7 g/dL (ref 3.5–5.0)
Alkaline Phosphatase: 61 U/L (ref 38–126)
Anion gap: 9 (ref 5–15)
BUN: 27 mg/dL — ABNORMAL HIGH (ref 8–23)
CO2: 30 mmol/L (ref 22–32)
Calcium: 10.1 mg/dL (ref 8.9–10.3)
Chloride: 102 mmol/L (ref 98–111)
Creatinine, Ser: 0.84 mg/dL (ref 0.44–1.00)
GFR, Estimated: >60 mL/min (ref >60)
Glucose, Bld: 116 mg/dL — ABNORMAL HIGH (ref 70–99)
Potassium: 3.8 mmol/L (ref 3.5–5.1)
Sodium: 141 mmol/L (ref 135–145)
Total Bilirubin: 0.6 mg/dL (ref 0.3–1.2)
Total Protein: 7.2 g/dL (ref 6.5–8.1)

## 2021-02-10 LAB — CBC
HCT: 43.8 % (ref 36.0–46.0)
Hemoglobin: 14.2 g/dL (ref 12.0–15.0)
MCH: 29.7 pg (ref 26.0–34.0)
MCHC: 32.4 g/dL (ref 30.0–36.0)
MCV: 91.6 fL (ref 80.0–100.0)
Platelets: 181 10*3/uL (ref 150–400)
RBC: 4.78 MIL/uL (ref 3.87–5.11)
RDW: 13.6 % (ref 11.5–15.5)
WBC: 7.2 10*3/uL (ref 4.0–10.5)
nRBC: 0 % (ref 0.0–0.2)

## 2021-02-10 LAB — URINALYSIS, ROUTINE W REFLEX MICROSCOPIC
Bilirubin Urine: NEGATIVE
Glucose, UA: NEGATIVE mg/dL
Hgb urine dipstick: NEGATIVE
Ketones, ur: 40 mg/dL — AB
Leukocytes,Ua: NEGATIVE
Nitrite: NEGATIVE
Specific Gravity, Urine: 1.019 (ref 1.005–1.030)
pH: 6.5 (ref 5.0–8.0)

## 2021-02-10 LAB — LIPASE, BLOOD: Lipase: 20 U/L (ref 11–51)

## 2021-02-10 MED ORDER — IOHEXOL 300 MG/ML  SOLN
80.0000 mL | Freq: Once | INTRAMUSCULAR | Status: AC | PRN
  Administered 2021-02-11: 80 mL via INTRAVENOUS

## 2021-02-10 MED ORDER — FENTANYL CITRATE PF 50 MCG/ML IJ SOSY
50.0000 ug | PREFILLED_SYRINGE | INTRAMUSCULAR | Status: DC | PRN
  Administered 2021-02-11 (×2): 50 ug via INTRAVENOUS
  Filled 2021-02-10 (×2): qty 1

## 2021-02-10 MED ORDER — ONDANSETRON HCL 4 MG/2ML IJ SOLN
4.0000 mg | Freq: Once | INTRAMUSCULAR | Status: AC
Start: 2021-02-11 — End: 2021-02-11
  Administered 2021-02-11: 4 mg via INTRAVENOUS
  Filled 2021-02-10: qty 2

## 2021-02-10 MED ORDER — SODIUM CHLORIDE 0.9 % IV SOLN: Freq: Once | INTRAVENOUS | Status: AC

## 2021-02-10 NOTE — ED Triage Notes (Signed)
Concern for bowel obstruction, h/o same  Last obstruction approx 3 months ago  N/V started today, abdominal pain started at 1700 Had BM this am

## 2021-02-10 NOTE — ED Provider Notes (Signed)
[facility] Provider Note: Georgena Spurling, MD, FACEP  CSN: [id number] MRN: [id number] ARRIVAL: 02/10/21 at 2028 ROOM: [place]  Abdominal Pain   HISTORY OF PRESENT ILLNESS  02/10/21 11:37 PM Emily Wolfe is a 66 y.o. female with a history of multiple abdominal surgeries and multiple small bowel obstructions.  She is here with abdominal pain that started about 5 PM.  It has been associated with nausea and vomiting.  She rates her pain as an 8 out of 10 and describes it as more of a burning pain than the usual "rolling and cramping" pain of previous bowel obstructions.  The pain is diffuse but most prominent in the left lower quadrant.  It is somewhat worse with movement or palpation.  She did have a bowel movement this morning which was normal.    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   Mild intermittent asthma without complication 42/59/5638   Multiple sclerosis ([place]) 06/29/2017   Osteoarthritis    Palpitations    Postmenopausal 06/29/2017   Scoliosis of lumbar spine 06/29/2017   Small bowel obstruction ([place]) 06/29/2017   Swallowing difficulty    Urticaria    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   Vitamin D deficiency 06/29/2017    Past Surgical History:  Procedure Laterality Date   ADENOIDECTOMY     CHOLECYSTECTOMY  2009   HERNIA REPAIR     HERNIA REPAIR     6/10, 9/10   HYSTERECTOMY ABDOMINAL WITH SALPINGO-OOPHORECTOMY     HYSTEROTOMY  2008   LAPAROSCOPIC  LYSIS OF ADHESIONS     , With small bowel resection and ventral hernia repair with mesh 2019/[place]   MOHS SURGERY     SINOSCOPY     TONSILLECTOMY     TYMPANOSTOMY TUBE PLACEMENT     VARICOSE VEIN SURGERY      Family History  Problem Relation Age of Onset   Breast cancer Mother    Cancer Mother    Cervical cancer Maternal Grandmother     Social History   Tobacco Use   Smoking status: Never   Smokeless tobacco: Never  Vaping Use   Vaping Use: Never used  Substance Use Topics   Alcohol use: Not Currently   Drug use: Not Currently    Prior to Admission medications   Medication Sig Start Date End Date Taking? Authorizing Provider  azelastine (ASTELIN) 0.1 % nasal spray PLACE 2 SPRAYS INTO BOTH NOSTRILS 2 TIMES DAILY. Patient taking differently: Place 1 spray into both nostrils daily as needed for allergies. 12/08/19   Dara Hoyer, FNP  Cholecalciferol (VITAMIN D3) 1.25 MG (50000 UT) CAPS Take 50,000 Units by mouth once a week. On Sunday Patient not taking: Reported on 01/24/2021 12/07/17   [provider]  DULoxetine (CYMBALTA) 60 MG capsule Take 60 mg by mouth daily.    [provider]  EPINEPHrine (AUVI-Q)  0.3 mg/0.3 mL IJ SOAJ injection Inject 0.3 mg into the muscle as needed for anaphylaxis. Patient taking differently: Inject 0.3 mg into the muscle once as needed for anaphylaxis. 01/30/20   Valentina Shaggy, MD  ipratropium (ATROVENT) 0.06 % nasal spray Place 2 sprays into both nostrils 4 (four) times daily. 01/30/20   Valentina Shaggy, MD  leflunomide (ARAVA) 10 MG tablet Take 10 mg by mouth daily.    [provider]  loratadine (CLARITIN) 10 MG tablet Take 10 mg by mouth daily.    [provider]  meloxicam (MOBIC) 15 MG tablet TAKE 1 TABLET (15 MG TOTAL) BY MOUTH DAILY. 01/21/21   Vivi Barrack, MD  ondansetron (ZOFRAN ODT) 4 MG disintegrating tablet Take 1 tablet (4 mg total) by mouth every 8 (eight) hours as needed for  nausea or vomiting. 11/24/20   Horton, Barbette Hair, MD    Allergies Patient has no known allergies.   REVIEW OF SYSTEMS  Negative except as noted here or in the History of Present Illness.   PHYSICAL EXAMINATION  Initial Vital Signs Blood pressure (!) 168/74, pulse 64, temperature 97.9 F (36.6 C), temperature source Oral, resp. rate 19, height 5\' 4"  (1.626 m), weight 62.3 kg, SpO2 100 %.  Examination General: Well-developed, thin female in no acute distress; appearance consistent with age of record HENT: normocephalic; atraumatic Eyes: pupils equal, round and reactive to light; extraocular muscles intact Neck: supple Heart: regular rate and rhythm Lungs: clear to auscultation bilaterally Abdomen: soft; nondistended; diffusely tender; bowel sounds present Extremities: No deformity; full range of motion; pulses normal Neurologic: Awake, alert and oriented; motor function intact in all extremities and symmetric; no facial droop Skin: Warm and dry Psychiatric: Normal mood and affect   RESULTS  Summary of this visit's results, reviewed and interpreted by myself:   EKG Interpretation  Date/Time:    Ventricular Rate:    PR Interval:    QRS Duration:   QT Interval:    QTC Calculation:   R Axis:     Text Interpretation:         Laboratory Studies: Results for orders placed or performed during the hospital encounter of 02/10/21 (from the past 24 hour(s))  Lipase, blood     Status: None   Collection Time: 02/10/21  8:43 PM  Result Value Ref Range   Lipase 20 11 - 51 U/L  Comprehensive metabolic panel     Status: Abnormal   Collection Time: 02/10/21  8:43 PM  Result Value Ref Range   Sodium 141 135 - 145 mmol/L   Potassium 3.8 3.5 - 5.1 mmol/L   Chloride 102 98 - 111 mmol/L   CO2 30 22 - 32 mmol/L   Glucose, Bld 116 (H) 70 - 99 mg/dL   BUN 27 (H) 8 - 23 mg/dL   Creatinine, Ser 0.84 0.44 - 1.00 mg/dL   Calcium 10.1 8.9 - 10.3 mg/dL   Total Protein 7.2 6.5 - 8.1 g/dL    Albumin 4.7 3.5 - 5.0 g/dL   AST 17 15 - 41 U/L   ALT 12 0 - 44 U/L   Alkaline Phosphatase 61 38 - 126 U/L   Total Bilirubin 0.6 0.3 - 1.2 mg/dL   GFR, Estimated >60 >60 mL/min   Anion gap 9 5 - 15  CBC     Status: None   Collection Time: 02/10/21  8:43 PM  Result Value Ref Range   WBC 7.2 4.0 - 10.5 K/uL  RBC 4.78 3.87 - 5.11 MIL/uL   Hemoglobin 14.2 12.0 - 15.0 g/dL   HCT 43.8 36.0 - 46.0 %   MCV 91.6 80.0 - 100.0 fL   MCH 29.7 26.0 - 34.0 pg   MCHC 32.4 30.0 - 36.0 g/dL   RDW 13.6 11.5 - 15.5 %   Platelets 181 150 - 400 K/uL   nRBC 0.0 0.0 - 0.2 %  Urinalysis, Routine w reflex microscopic Urine, Clean Catch     Status: Abnormal   Collection Time: 02/10/21 11:25 PM  Result Value Ref Range   Color, Urine YELLOW YELLOW   APPearance CLEAR CLEAR   Specific Gravity, Urine 1.019 1.005 - 1.030   pH 6.5 5.0 - 8.0   Glucose, UA NEGATIVE NEGATIVE mg/dL   Hgb urine dipstick NEGATIVE NEGATIVE   Bilirubin Urine NEGATIVE NEGATIVE   Ketones, ur 40 (A) NEGATIVE mg/dL   Protein, ur TRACE (A) NEGATIVE mg/dL   Nitrite NEGATIVE NEGATIVE   Leukocytes,Ua NEGATIVE NEGATIVE   Imaging Studies: CT ABDOMEN PELVIS W CONTRAST  Result Date: 02/11/2021 CLINICAL DATA:  Abdominal pain.  Concern for bowel obstruction EXAM: CT ABDOMEN AND PELVIS WITH CONTRAST TECHNIQUE: Multidetector CT imaging of the abdomen and pelvis was performed using the standard protocol following bolus administration of intravenous contrast. RADIATION DOSE REDUCTION: This exam was performed according to the departmental dose-optimization program which includes automated exposure control, adjustment of the mA and/or kV according to patient size and/or use of iterative reconstruction technique. CONTRAST:  60mL OMNIPAQUE IOHEXOL 300 MG/ML  SOLN COMPARISON:  08/31/2020 FINDINGS: Lower chest: No acute abnormality Hepatobiliary: No focal liver abnormality is seen. Status post cholecystectomy. No biliary dilatation. Pancreas: No focal  abnormality or ductal dilatation. Spleen: No focal abnormality.  Normal size. Adrenals/Urinary Tract: No adrenal abnormality. No focal renal abnormality. No stones or hydronephrosis. Urinary bladder is unremarkable. Stomach/Bowel: Large stool burden throughout the colon. Dilated small bowel loops in the lower pelvis with fecalization of contents. Findings concerning for small bowel obstruction. Postoperative changes within small bowel loops in the pelvis. Vascular/Lymphatic: No evidence of aneurysm or adenopathy. Reproductive: Prior hysterectomy.  No adnexal masses. Other: No free fluid or free air. Musculoskeletal: No acute bony abnormality. Degenerative changes in the lumbar spine. IMPRESSION: Dilated, fecalized small bowel loops in the pelvis concerning for small bowel obstruction. Large stool burden throughout the colon. Electronically Signed   By: Rolm Baptise M.D.   On: 02/11/2021 00:31    ED COURSE and MDM  Nursing notes, initial and subsequent vitals signs, including pulse oximetry, reviewed and interpreted by myself.  Vitals:   02/10/21 2035 02/10/21 2245 02/10/21 2354 02/11/21 0030  BP: (!) 166/82 (!) 168/74 (!) 164/69 (!) 141/75  Pulse: 61 64 71 75  Resp: 18 19 20 16   Temp: 97.9 F (36.6 C)     TempSrc: Oral     SpO2: 100% 100% 100% 97%  Weight:      Height:       Medications  fentaNYL (SUBLIMAZE) injection 50 mcg (50 mcg Intravenous Given 02/11/21 0022)  0.9 %  sodium chloride infusion ( Intravenous New Bag/Given 02/11/21 0023)  ondansetron (ZOFRAN) injection 4 mg (4 mg Intravenous Given 02/11/21 0020)  iohexol (OMNIPAQUE) 300 MG/ML solution 80 mL (80 mLs Intravenous Contrast Given 02/11/21 0003)   CT findings are consistent with small bowel obstruction.  The patient has had multiple episodes of the same.  We will have her admitted to the medicine service with surgical consultation.  1:03 AM Hal Hope  to admit to the hospitalist service at [facility].  Dr. Armandina Gemma consulted for  general surgery.  Patient is not vomiting nor distended we will hold off on placement of NG tube at this time.   PROCEDURES  Procedures   ED DIAGNOSES     ICD-10-CM   1. Small bowel obstruction ([place])  K56.609          Yasmeen Manka, Jenny Reichmann, MD 02/11/21 0104

## 2021-02-11 ENCOUNTER — Ambulatory Visit (HOSPITAL_BASED_OUTPATIENT_CLINIC_OR_DEPARTMENT_OTHER): Payer: Medicare Other | Admitting: Radiology

## 2021-02-11 DIAGNOSIS — R109 Unspecified abdominal pain: Secondary | ICD-10-CM | POA: Diagnosis not present

## 2021-02-11 DIAGNOSIS — R112 Nausea with vomiting, unspecified: Secondary | ICD-10-CM | POA: Diagnosis not present

## 2021-02-11 LAB — RESP PANEL BY RT-PCR (FLU A&B, COVID) ARPGX2
Influenza A by PCR: NEGATIVE
Influenza B by PCR: NEGATIVE
SARS Coronavirus 2 by RT PCR: NEGATIVE

## 2021-02-11 IMAGING — DX DG ABDOMEN 1V
1 series · 1 of 1 positions shown · non-contrast
Comparison: Radiographs [DATE] and [DATE].  CT [DATE].

CLINICAL DATA: Nausea vomiting with abdominal pain. History of
bowel obstruction.

EXAM:
ABDOMEN - 1 VIEW

[abdomen kub]
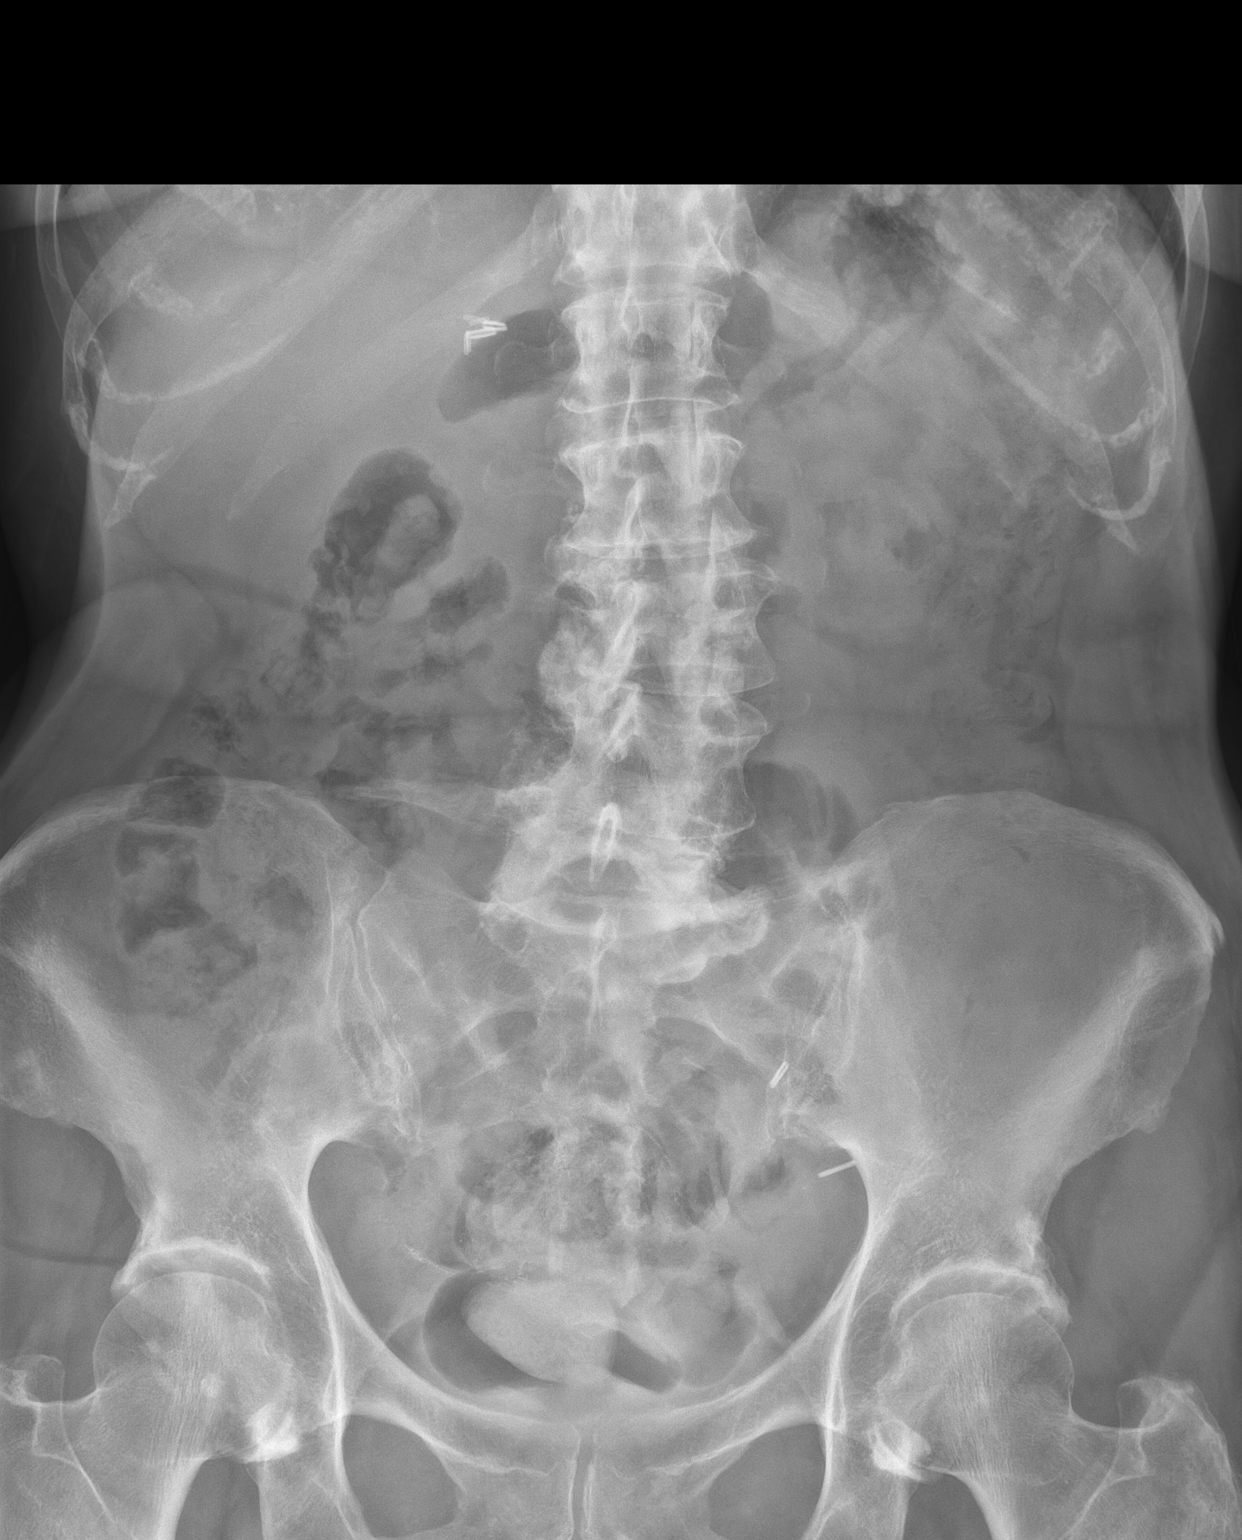

[1 of 1 positions shown; findings below may reference images not displayed]

FINDINGS: The bowel gas pattern is nonobstructive. No supine evidence of bowel
wall thickening or pneumoperitoneum. There are bowel anastomosis
clips in the pelvis as well as cholecystectomy clips. Degenerative
changes are present in the spine associated with a convex left
scoliosis.
IMPRESSION: Normal nonobstructive bowel gas pattern. The mildly dilated loops of
distal small bowel on earlier CT are not apparent.

## 2021-02-11 IMAGING — CT CT ABD-PELV W/ CM
2 of 5 series · 16 of 46 positions shown, 18 images · IV contrast (APPLIED)
Comparison: [DATE]

CLINICAL DATA: Abdominal pain.  Concern for bowel obstruction

EXAM:
CT ABDOMEN AND PELVIS WITH CONTRAST
TECHNIQUE: Multidetector CT imaging of the abdomen and pelvis was performed
using the standard protocol following bolus administration of
intravenous contrast.

[Series 2: abd pel w · axial · 0.73mm/px · z∈[-219,+171]mm · 13 of 88 slices shown, 15 images]
[im 5/88  soft-tissue]
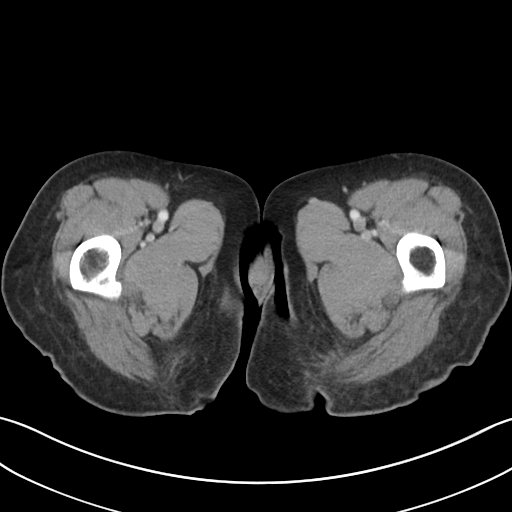
[im 5/88  bone]
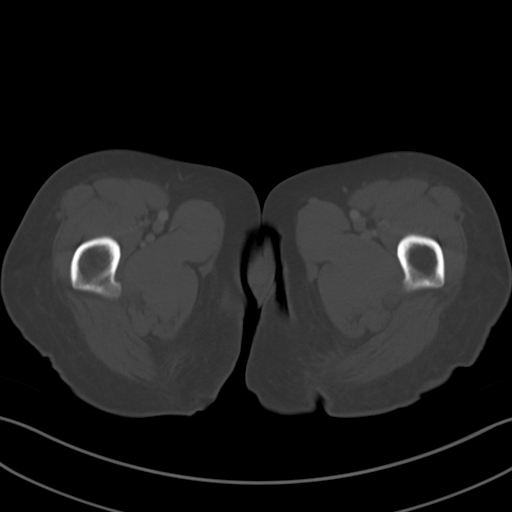
[im 14/88  soft-tissue]
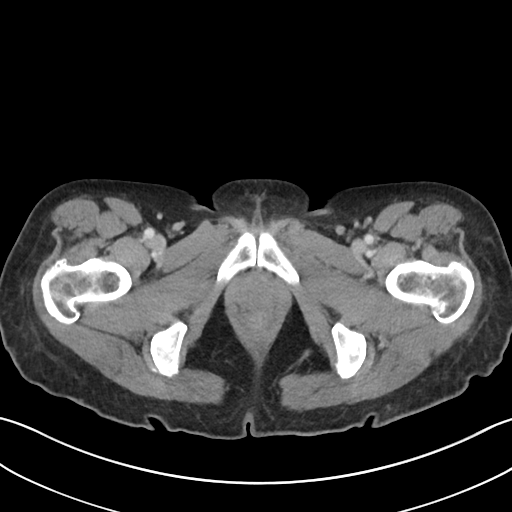
[im 19/88  soft-tissue]
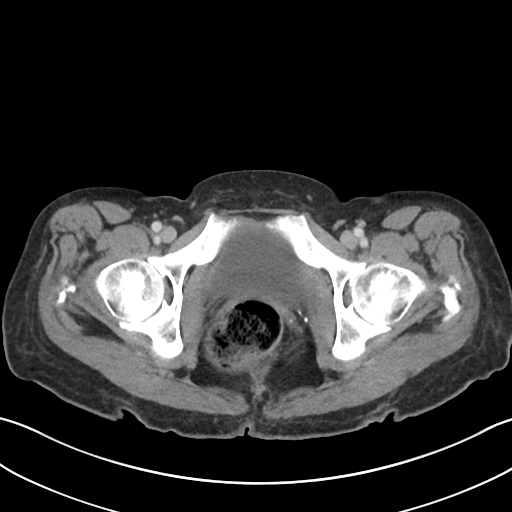
[im 23/88  soft-tissue]
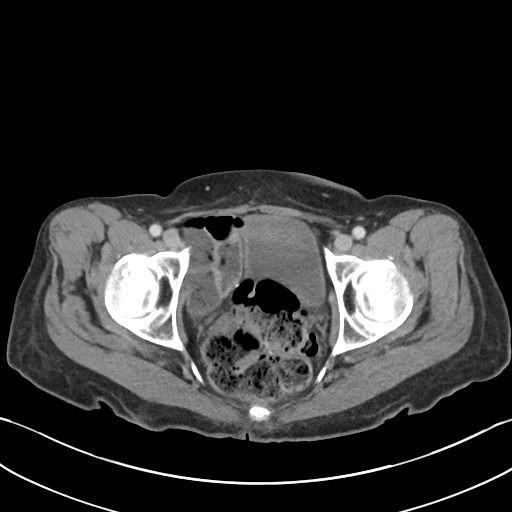
[im 33/88  soft-tissue]
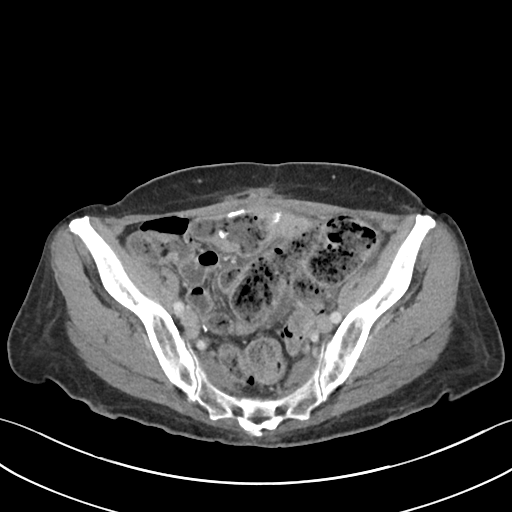
[im 37/88  soft-tissue]
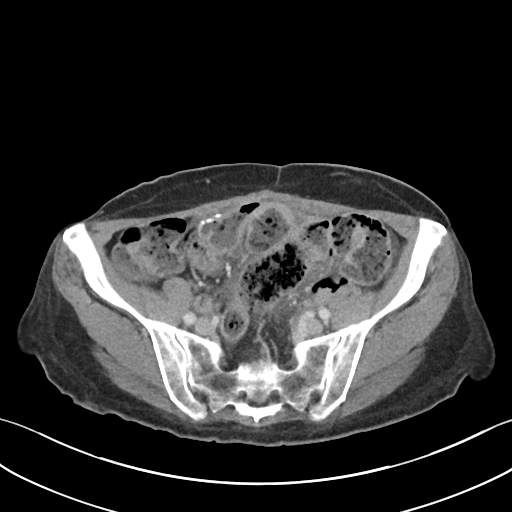
[im 46/88  soft-tissue]
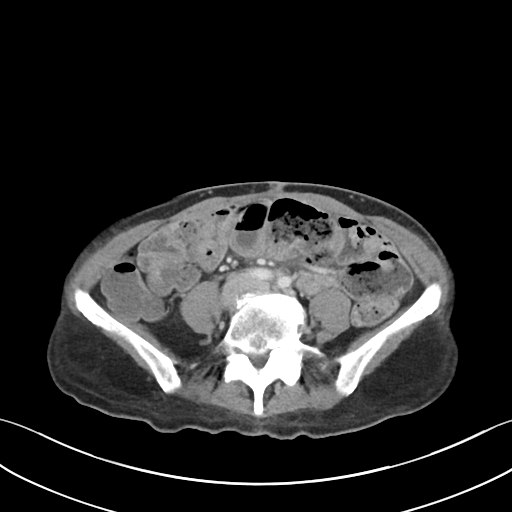
[im 51/88  soft-tissue]
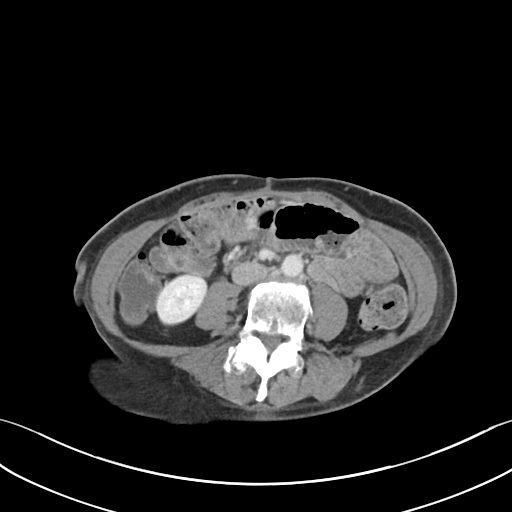
[im 55/88  soft-tissue]
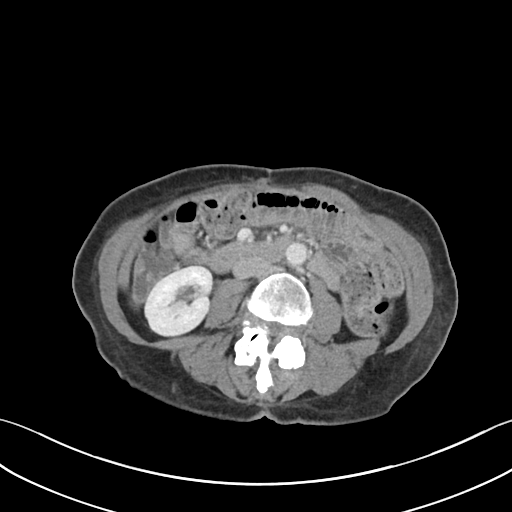
[im 55/88  bone]
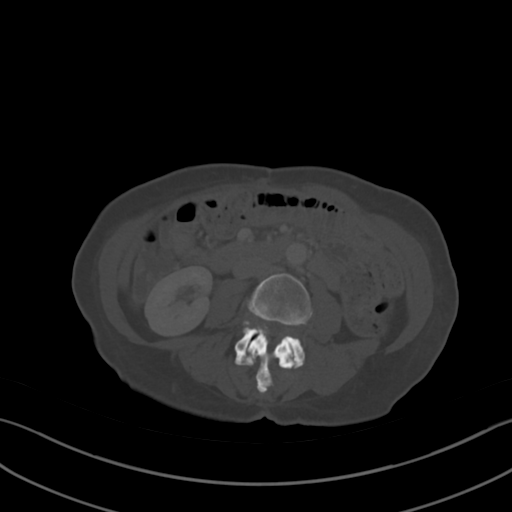
[im 65/88  soft-tissue]
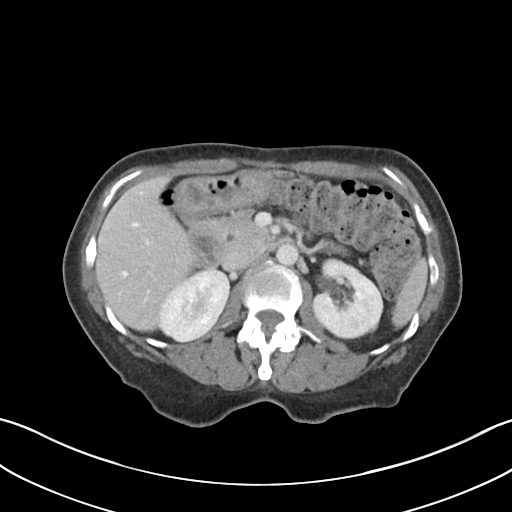
[im 69/88  soft-tissue]
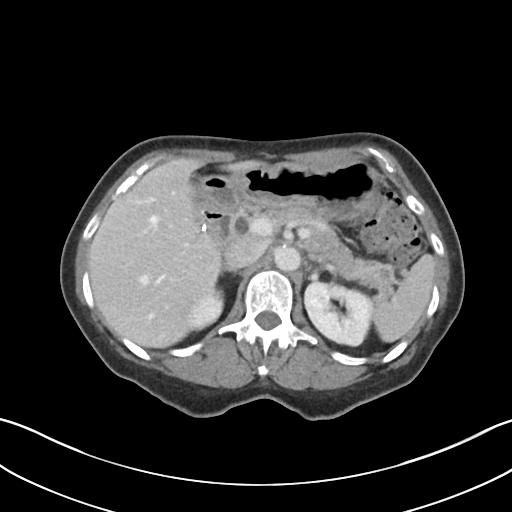
[im 74/88  soft-tissue]
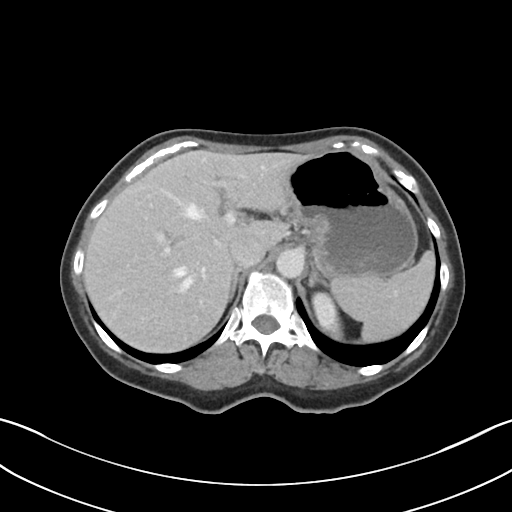
[im 83/88  soft-tissue]
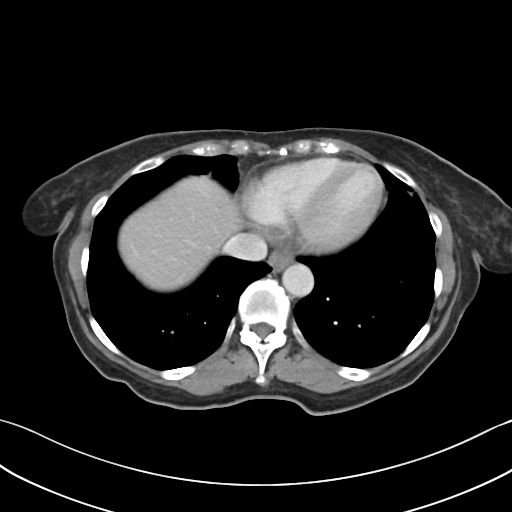

[Series 5: coronal · coronal · 0.77mm/px · 3 of 83 slices shown]
[im 28/83  soft-tissue]
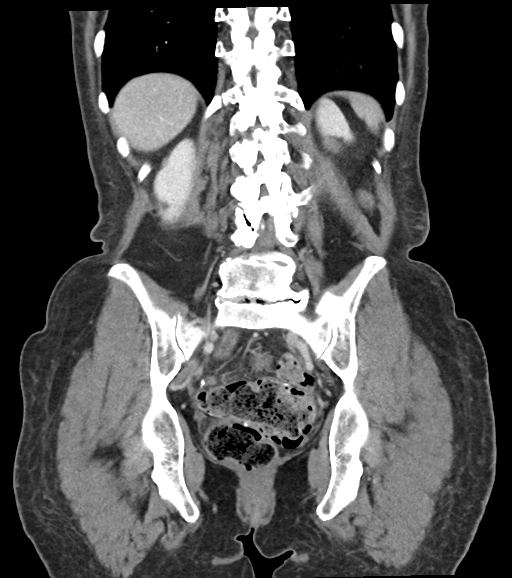
[im 37/83  soft-tissue]
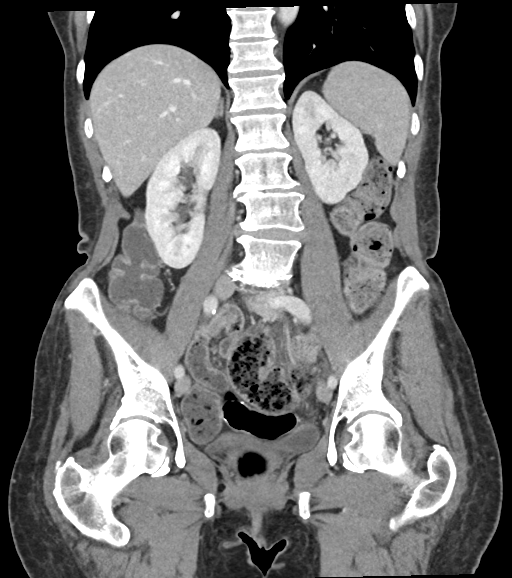
[im 46/83  soft-tissue]
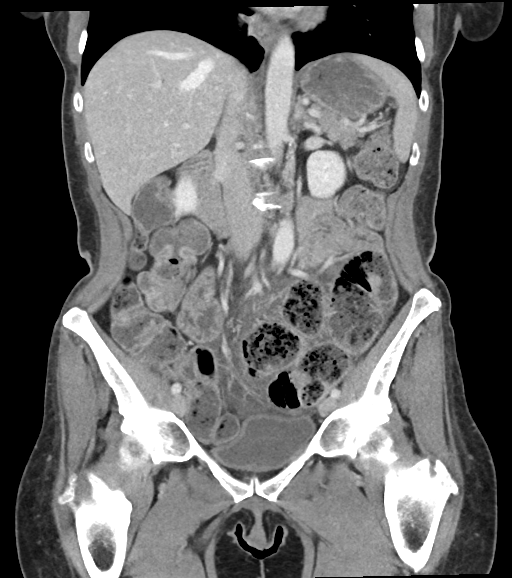

[16 of 46 positions shown; findings below may reference images not displayed]

RADIATION DOSE REDUCTION: This exam was performed according to the
departmental dose-optimization program which includes automated
exposure control, adjustment of the mA and/or kV according to
patient size and/or use of iterative reconstruction technique.

CONTRAST:  80mL OMNIPAQUE IOHEXOL 300 MG/ML  SOLN
FINDINGS: Lower chest: No acute abnormality

Hepatobiliary: No focal liver abnormality is seen. Status post
cholecystectomy. No biliary dilatation.

Pancreas: No focal abnormality or ductal dilatation.

Spleen: No focal abnormality.  Normal size.

Adrenals/Urinary Tract: No adrenal abnormality. No focal renal
abnormality. No stones or hydronephrosis. Urinary bladder is
unremarkable.

Stomach/Bowel: Large stool burden throughout the colon. Dilated
small bowel loops in the lower pelvis with fecalization of contents.
Findings concerning for small bowel obstruction. Postoperative
changes within small bowel loops in the pelvis.

Vascular/Lymphatic: No evidence of aneurysm or adenopathy.

Reproductive: Prior hysterectomy.  No adnexal masses.

Other: No free fluid or free air.

Musculoskeletal: No acute bony abnormality. Degenerative changes in
the lumbar spine.
IMPRESSION: Dilated, fecalized small bowel loops in the pelvis concerning for
small bowel obstruction.

Large stool burden throughout the colon.

## 2021-02-11 MED ORDER — BISACODYL 10 MG RE SUPP: 10.0000 mg | Freq: Every day | RECTAL | Status: DC | PRN

## 2021-02-11 MED ORDER — SORBITOL 70 % SOLN: 960.0000 mL | TOPICAL_OIL | Freq: Once | ORAL | Status: DC

## 2021-02-11 MED ORDER — POLYETHYLENE GLYCOL 3350 17 G PO PACK: 17.0000 g | PACK | Freq: Every day | ORAL | 0 refills | Status: DC

## 2021-02-11 MED ORDER — DOCUSATE SODIUM 100 MG PO CAPS: 100.0000 mg | ORAL_CAPSULE | Freq: Two times a day (BID) | ORAL | Status: DC

## 2021-02-11 MED ORDER — POLYETHYLENE GLYCOL 3350 17 G PO PACK: 17.0000 g | PACK | Freq: Every day | ORAL | Status: DC

## 2021-02-11 MED ORDER — FLEET ENEMA 7-19 GM/118ML RE ENEM: 1.0000 | ENEMA | Freq: Once | RECTAL | Status: DC

## 2021-02-11 MED ORDER — DOCUSATE SODIUM 100 MG PO CAPS: 100.0000 mg | ORAL_CAPSULE | Freq: Two times a day (BID) | ORAL | 0 refills | Status: DC

## 2021-02-11 NOTE — ED Notes (Signed)
Patient drank juice and ate crackers with not problems

## 2021-02-11 NOTE — ED Notes (Signed)
Pt up to bathroom, reports large, soft BM.

## 2021-02-11 NOTE — ED Provider Notes (Signed)
[facility].  However she has had multiple bowel movements today and feels a lot better.  She states her abdomen feels sore but it is better.  Repeat x-ray from general surgery today shows what appears to be a resolution of the small bowel dilation.  She has had this many times before per her and always seems to resolve whenever she is able to go to the bathroom.  We will treat with some MiraLAX and Colace.  However I do not think she needs to be admitted at this point and can follow-up with outpatient gastroenterology, has seen Dr. Fuller Plan in the past.   Sherwood Gambler, MD 02/11/21 1202

## 2021-02-11 NOTE — ED Notes (Signed)
Patient discharged to home.  Instructions reviewed and patient gave verbal indications understood such.  Left ambulatory in good condition.

## 2021-02-11 NOTE — Discharge Instructions (Signed)
If you develop worsening, continued, or recurrent abdominal pain, uncontrolled vomiting, fever, chest or back pain, or any other new/concerning symptoms then return to the ER for evaluation.  

## 2021-02-11 NOTE — ED Notes (Signed)
Patient transported to CT via stretcher with tech

## 2021-02-17 DIAGNOSIS — G8929 Other chronic pain: Secondary | ICD-10-CM | POA: Diagnosis not present

## 2021-02-17 DIAGNOSIS — M25551 Pain in right hip: Secondary | ICD-10-CM | POA: Diagnosis not present

## 2021-02-17 DIAGNOSIS — M6281 Muscle weakness (generalized): Secondary | ICD-10-CM | POA: Diagnosis not present

## 2021-02-18 ENCOUNTER — Other Ambulatory Visit: Payer: Self-pay

## 2021-02-18 ENCOUNTER — Ambulatory Visit (INDEPENDENT_AMBULATORY_CARE_PROVIDER_SITE_OTHER): Payer: Medicare Other | Admitting: Allergy & Immunology

## 2021-02-18 ENCOUNTER — Encounter: Payer: Self-pay | Admitting: Allergy & Immunology

## 2021-02-18 VITALS — BP 134/80 | HR 78 | Temp 98.4°F | Resp 18 | Ht 64.0 in | Wt 134.5 lb

## 2021-02-18 DIAGNOSIS — J3089 Other allergic rhinitis: Secondary | ICD-10-CM

## 2021-02-18 DIAGNOSIS — J3489 Other specified disorders of nose and nasal sinuses: Secondary | ICD-10-CM

## 2021-02-18 DIAGNOSIS — J302 Other seasonal allergic rhinitis: Secondary | ICD-10-CM

## 2021-02-18 NOTE — Progress Notes (Signed)
FOLLOW UP  Date of Service/Encounter:  02/18/21   Assessment:   Seasonal and perennial allergic rhinitis (ragweed, grasses, indoor molds and outdoor molds) - on allergen immunotherapy with maintenance reached March 2020   Left sided nasal obstruction - referring to Dr. Benjamine Mola today   Fully vaccinated against [employer], but needs bivalent booster   Complicated past medical history, including multiple sclerosis  Plan/Recommendations:   1. Seasonal and perennial allergic rhinitis (ragweed, grasses, indoor molds and outdoor molds) - Continue with allergy shots at once weekly.  - It seems that you are in a good position.  - Continue with: antihistamine daily, fluticasone one spray per nostril up to twice daily, and nasal saline rinses 1-2 times daily  - We are going to refer you to Dr. Benjamine Mola for evaluation of the left nasal obstruction. - Make an appointment for a COVID19 bivalent booster on your way out.    2. Return in about 1 year (around 02/18/2022).    Subjective:   Emily Wolfe is a 66 y.o. female presenting today for follow up of  Chief Complaint  Patient presents with   Follow-up    Emily Wolfe has a history of the following: Patient Active Problem List   Diagnosis Date Noted   Trochanteric bursitis, right hip 12/27/2020   Primary osteoarthritis of right hip 12/27/2020   Osteoarthritis 11/22/2020   Dehydration 08/31/2020   Class 1 obesity with serious comorbidity and body mass index (BMI) of 30.0 to 30.9 in adult 01/26/2020   SBO (small bowel obstruction) ([place]) 03/21/2019   Nightmares 03/07/2019   Anxiety 12/20/2018   Cervicalgia 08/18/2018   Cough variant asthma vs UACS 01/03/2018   History of total hysterectomy, due to ovarian cancer 12/26/2017   Genital warts 11/19/2017   History of ovarian cancer 11/19/2017   Seasonal and perennial allergic rhinitis, followed by Allergist, treated with saline rinses and allergy shots 11/02/2017   Mild intermittent  asthma without complication 16/01/930   Hyperlipidemia 06/29/2017   Essential hypertension 06/29/2017   MS (multiple sclerosis) ([place]) 06/29/2017   Scoliosis of lumbar spine 06/29/2017   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   Vitamin D deficiency 06/29/2017   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   Lung mass 06/29/2017   Colon abnormality 05/11/2017    History obtained from: chart review and patient.  Emily Wolfe is a 66 y.o. female presenting for a follow up visit.  We last saw her in January 2022 as a televisit.  At that time, she was doing well but seem to get worse whenever she spaced her allergy shots out further than a week.  She did endorse a lot of rhinorrhea.  We added ipratropium to see if this would provide any relief.  She has been having some GI issues. She normally has this twice per year, but now it is increasing to three times per year. This is a small bowel obstruction. This happens more and more frequently. She continues to follow with Dr. Fuller Plan. She has worked on some abdominal massages.   Taiyana is on allergen immunotherapy. She receives two injections. Immunotherapy script #1 contains  ragweed and grasses. She currently receives 0.51mL of the RED vial (1/100). Immunotherapy script #2 contains molds. She currently receives 0.14mL of the RED vial (1/100). She started shots December of 2019 and reached maintenance in March of 2020.  Allergic rhinitis has been under good control with her allergy shots. She does have some postnasal drip that she treats  with the nasal sprays. She has not needed any antibiotics. She does not use antihistamines on a regular basis.   She does report that her left side of her nose is more obstructed than the right. She has seen someone at [facility], but she is interested in seeing someone else instead. She did not feel that anyone listened to her.   She continues to follow with Neurology in [facility].   Otherwise, there have been no changes to her past medical history, surgical history, family history, or social history.    Review of Systems  Constitutional: Negative.  Negative for chills, fever, malaise/fatigue and weight loss.  HENT: Negative.  Negative for congestion, ear discharge and ear pain.   Eyes:  Negative for pain, discharge and redness.  Respiratory:  Negative for cough, sputum production, shortness of breath and wheezing.   Cardiovascular: Negative.  Negative for chest pain and palpitations.  Gastrointestinal:  Positive for abdominal pain and nausea. Negative for constipation, diarrhea, heartburn and vomiting.  Skin: Negative.  Negative for itching and rash.  Neurological:  Negative for dizziness and headaches.  Endo/Heme/Allergies:  Negative for environmental allergies. Does not bruise/bleed easily.      Objective:   Blood pressure 134/80, pulse 78, temperature 98.4 F (36.9 C), resp. rate 18, height 5\' 4"  (1.626 m), weight 134 lb 8 oz (61 kg), SpO2 99 %. Body mass index is 23.09 kg/m.    Physical Exam Vitals reviewed.  Constitutional:      Appearance: She is well-developed.  HENT:     Head: Normocephalic and atraumatic.     Right Ear: Tympanic membrane, ear canal and external ear normal.     Left Ear: Tympanic membrane, ear canal and external ear normal.     Nose: No nasal deformity, septal deviation, mucosal edema or rhinorrhea.     Left Nostril: Occlusion present.     Right Turbinates: Enlarged, swollen and pale.     Left Turbinates: Enlarged, swollen and pale.     Right Sinus: No maxillary sinus tenderness or frontal sinus tenderness.     Left Sinus: No maxillary sinus tenderness or frontal sinus tenderness.     Mouth/Throat:     Mouth: Mucous membranes are not pale and not dry.     Pharynx: Uvula midline.  Eyes:     General: Lids are normal. No allergic shiner.       Right eye: No discharge.        Left eye: No discharge.      Conjunctiva/sclera: Conjunctivae normal.     Right eye: Right conjunctiva is not injected. No chemosis.    Left eye: Left conjunctiva is not injected. No chemosis.    Pupils: Pupils are equal, round, and reactive to light.  Cardiovascular:     Rate and Rhythm: Normal rate and regular rhythm.     Heart sounds: Normal heart sounds.  Pulmonary:     Effort: Pulmonary effort is normal. No tachypnea, accessory muscle usage or respiratory distress.     Breath sounds: Normal breath sounds. No wheezing, rhonchi or rales.  Chest:     Chest wall: No tenderness.  Lymphadenopathy:     Cervical: No cervical adenopathy.  Skin:    Coloration: Skin is not pale.     Findings: No abrasion, erythema, petechiae or rash. Rash is not papular, urticarial or vesicular.  Neurological:     Mental Status: She is alert.  Psychiatric:  Behavior: Behavior is cooperative.     Diagnostic studies: none        Salvatore Marvel, MD  [facility] [place]

## 2021-02-18 NOTE — Patient Instructions (Addendum)
1. Seasonal and perennial allergic rhinitis (ragweed, grasses, indoor molds and outdoor molds) - Continue with allergy shots at once weekly.  - It seems that you are in a good position.  - Continue with: antihistamine daily, fluticasone one spray per nostril up to twice daily, and nasal saline rinses 1-2 times daily  - We are going to refer you to Dr. Benjamine Mola for evaluation of the left nasal obstruction. - Make an appointment for a COVID19 bivalent booster on your way out.    2. Return in about 1 year (around 02/18/2022).    Please inform us of any Emergency Department visits, hospitalizations, or changes in symptoms. Call us before going to the ED for breathing or allergy symptoms since we might be able to fit you in for a sick visit. Feel free to contact us anytime with any questions, problems, or concerns.  It was a pleasure to see you again today!  Websites that have reliable patient information: 1. American Academy of Asthma, Allergy, and Immunology: www.aaaai.org 2. Food Allergy Research and Education (FARE): foodallergy.org 3. Mothers of Asthmatics: http://www.asthmacommunitynetwork.org 4. American College of Allergy, Asthma, and Immunology: www.acaai.org   COVID-19 Vaccine Information can be found at: ShippingScam.co.uk For questions related to vaccine distribution or appointments, please email [email or web address] or call [phone].   We realize that you might be concerned about having an allergic reaction to the COVID19 vaccines. To help with that concern, WE ARE OFFERING THE COVID19 VACCINES IN OUR OFFICE! Ask the front desk for dates!     Like [place] on [place] and Instagram for our latest updates!      A healthy democracy works best when [employer] participate! Make sure you are registered to vote! If you have moved or changed any of your contact information, you will need to get this updated before voting!  In  some cases, you MAY be able to register to vote online: [email or web address]

## 2021-02-24 ENCOUNTER — Telehealth: Payer: Self-pay

## 2021-02-24 DIAGNOSIS — M6281 Muscle weakness (generalized): Secondary | ICD-10-CM | POA: Diagnosis not present

## 2021-02-24 DIAGNOSIS — M25551 Pain in right hip: Secondary | ICD-10-CM | POA: Diagnosis not present

## 2021-02-24 DIAGNOSIS — G8929 Other chronic pain: Secondary | ICD-10-CM | POA: Diagnosis not present

## 2021-02-24 NOTE — Telephone Encounter (Signed)
-----   Message from Valentina Shaggy, MD sent at 02/18/2021  8:38 PM EST ----- ENT referral placed.

## 2021-02-24 NOTE — Telephone Encounter (Signed)
Referral has been faxed to Dr Deeann Saint office for review and scheduling. MyChart message sent to patient to give an update.

## 2021-02-25 NOTE — Telephone Encounter (Signed)
Thank you, Dee!   Tammera Engert, MD [facility]  

## 2021-02-26 DIAGNOSIS — M25551 Pain in right hip: Secondary | ICD-10-CM | POA: Diagnosis not present

## 2021-02-26 DIAGNOSIS — M6281 Muscle weakness (generalized): Secondary | ICD-10-CM | POA: Diagnosis not present

## 2021-02-26 DIAGNOSIS — G8929 Other chronic pain: Secondary | ICD-10-CM | POA: Diagnosis not present

## 2021-03-03 DIAGNOSIS — M6281 Muscle weakness (generalized): Secondary | ICD-10-CM | POA: Diagnosis not present

## 2021-03-03 DIAGNOSIS — G8929 Other chronic pain: Secondary | ICD-10-CM | POA: Diagnosis not present

## 2021-03-03 DIAGNOSIS — M25551 Pain in right hip: Secondary | ICD-10-CM | POA: Diagnosis not present

## 2021-03-05 DIAGNOSIS — M25551 Pain in right hip: Secondary | ICD-10-CM | POA: Diagnosis not present

## 2021-03-05 DIAGNOSIS — G8929 Other chronic pain: Secondary | ICD-10-CM | POA: Diagnosis not present

## 2021-03-05 DIAGNOSIS — M6281 Muscle weakness (generalized): Secondary | ICD-10-CM | POA: Diagnosis not present

## 2021-03-06 ENCOUNTER — Ambulatory Visit (INDEPENDENT_AMBULATORY_CARE_PROVIDER_SITE_OTHER): Payer: Medicare Other | Admitting: *Deleted

## 2021-03-06 DIAGNOSIS — J309 Allergic rhinitis, unspecified: Secondary | ICD-10-CM | POA: Diagnosis not present

## 2021-03-10 DIAGNOSIS — M25551 Pain in right hip: Secondary | ICD-10-CM | POA: Diagnosis not present

## 2021-03-10 DIAGNOSIS — G8929 Other chronic pain: Secondary | ICD-10-CM | POA: Diagnosis not present

## 2021-03-10 DIAGNOSIS — M6281 Muscle weakness (generalized): Secondary | ICD-10-CM | POA: Diagnosis not present

## 2021-03-12 DIAGNOSIS — M25551 Pain in right hip: Secondary | ICD-10-CM | POA: Diagnosis not present

## 2021-03-12 DIAGNOSIS — G8929 Other chronic pain: Secondary | ICD-10-CM | POA: Diagnosis not present

## 2021-03-12 DIAGNOSIS — M6281 Muscle weakness (generalized): Secondary | ICD-10-CM | POA: Diagnosis not present

## 2021-03-13 ENCOUNTER — Ambulatory Visit (INDEPENDENT_AMBULATORY_CARE_PROVIDER_SITE_OTHER): Payer: Medicare Other | Admitting: *Deleted

## 2021-03-13 ENCOUNTER — Encounter: Payer: Self-pay | Admitting: Gastroenterology

## 2021-03-13 ENCOUNTER — Ambulatory Visit: Payer: Medicare Other | Admitting: Gastroenterology

## 2021-03-13 VITALS — BP 132/70 | HR 74 | Ht 65.0 in | Wt 136.6 lb

## 2021-03-13 DIAGNOSIS — J309 Allergic rhinitis, unspecified: Secondary | ICD-10-CM | POA: Diagnosis not present

## 2021-03-13 DIAGNOSIS — K56609 Unspecified intestinal obstruction, unspecified as to partial versus complete obstruction: Secondary | ICD-10-CM | POA: Diagnosis not present

## 2021-03-13 DIAGNOSIS — Z86010 Personal history of colonic polyps: Secondary | ICD-10-CM | POA: Diagnosis not present

## 2021-03-13 NOTE — Progress Notes (Signed)
? ? ?  History of Present Illness: This is a 66 year old female with a history of recurrent small bowel obstructions.  She has a history of ovarian cancer status post an extensive resection in 2009.  Status post ventral hernia and right inguinal hernia repairs in 2010.  She is status post cholecystectomy and another ventral hernia repair with mesh.  Refer to September 12, 2020 and April 05, 2019 GI office notes.  She was evaluated in the ED on February 6. CT AP was performed with findings consistent with a small bowel obstruction.  Plans were made to admit to the hospitalist service with general surgery consulting.  While in the ED awaiting a bed her symptoms resolved and she was discharged from the ED. she maintained a liquid diet for a week or 2 and then has gradually resumed her prior diet.  She has no abdominal pain.  She has been taking Dulcolax for mild constipation. Colonoscopy October 2017-few nonbleeding diverticuli in the sigmoid colon evidence of prior end-to-side colocolonic anastomosis in the sigmoid colon, surgical changes consistent with left hemicolectomy and widely patent anastomosis, small nonbleeding grade 1 internal hemorrhoids and a single diminutive polyp was removed from the sigmoid colon.  Path consistent with tubular adenoma. ? ? ?Current Medications, Allergies, Past Medical History, Past Surgical History, Family History and Social History were reviewed in [employer] record. ? ? ?Physical Exam: ?General: Well developed, well nourished, no acute distress ?Head: Normocephalic and atraumatic ?Eyes: Sclerae anicteric, EOMI ?Ears: Normal auditory acuity ?Mouth: Not examined, mask on during Covid-19 pandemic ?Lungs: Clear throughout to auscultation ?Heart: Regular rate and rhythm; no murmurs, rubs or bruits ?Abdomen: Soft, non tender and non distended. No masses, hepatosplenomegaly or hernias noted. Normal Bowel sounds ?Rectal: Not done ?Musculoskeletal: Symmetrical with no  gross deformities  ?Pulses:  Normal pulses noted ?Extremities: No clubbing, cyanosis, edema or deformities noted ?Neurological: Alert oriented x 4, grossly nonfocal ?Psychological:  Alert and cooperative. Normal mood and affect ? ? ?Assessment and Recommendations: ? 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 S/P status post extensive resection in 2009 for ovarian cancer that included a left hemicolectomy.  Her small bowel obstruction in early February has resolved.  She is advised to avoid fiber supplements, raw fruits and raw vegetables.  MiraLAX once or twice daily for management of mild constipation.  If obstructive symptoms recur she is advised to follow-up with General Surgery.  ?Personal history of adenomatous colon polyps.  A 7-year interval surveillance colonoscopy is recommended in November 2024. ?MS ?

## 2021-03-13 NOTE — Patient Instructions (Signed)
Follow up with your general surgeon in [place].  ? ?Follow up with our office as needed.  ? ?The [facility] providers would like to encourage you to use [facility] to communicate with providers for non-urgent requests or questions.  Due to long hold times on the telephone, sending your provider a message by [facility] may be a faster and more efficient way to get a response.  Please allow 48 business hours for a response.  Please remember that this is for non-urgent requests.  ? ?Thank you for choosing me and [facility]. ? ?Malcolm T. Dagoberto Ligas., MD., [facility] ? ?

## 2021-03-18 DIAGNOSIS — G8929 Other chronic pain: Secondary | ICD-10-CM | POA: Diagnosis not present

## 2021-03-18 DIAGNOSIS — M6281 Muscle weakness (generalized): Secondary | ICD-10-CM | POA: Diagnosis not present

## 2021-03-18 DIAGNOSIS — M25551 Pain in right hip: Secondary | ICD-10-CM | POA: Diagnosis not present

## 2021-03-20 DIAGNOSIS — M25551 Pain in right hip: Secondary | ICD-10-CM | POA: Diagnosis not present

## 2021-03-20 DIAGNOSIS — G8929 Other chronic pain: Secondary | ICD-10-CM | POA: Diagnosis not present

## 2021-03-20 DIAGNOSIS — M6281 Muscle weakness (generalized): Secondary | ICD-10-CM | POA: Diagnosis not present

## 2021-03-21 ENCOUNTER — Ambulatory Visit (INDEPENDENT_AMBULATORY_CARE_PROVIDER_SITE_OTHER): Payer: Medicare Other

## 2021-03-21 DIAGNOSIS — J309 Allergic rhinitis, unspecified: Secondary | ICD-10-CM | POA: Diagnosis not present

## 2021-03-24 DIAGNOSIS — G8929 Other chronic pain: Secondary | ICD-10-CM | POA: Diagnosis not present

## 2021-03-24 DIAGNOSIS — M6281 Muscle weakness (generalized): Secondary | ICD-10-CM | POA: Diagnosis not present

## 2021-03-24 DIAGNOSIS — M25551 Pain in right hip: Secondary | ICD-10-CM | POA: Diagnosis not present

## 2021-03-26 DIAGNOSIS — M25551 Pain in right hip: Secondary | ICD-10-CM | POA: Diagnosis not present

## 2021-03-26 DIAGNOSIS — M6281 Muscle weakness (generalized): Secondary | ICD-10-CM | POA: Diagnosis not present

## 2021-03-26 DIAGNOSIS — G8929 Other chronic pain: Secondary | ICD-10-CM | POA: Diagnosis not present

## 2021-03-26 NOTE — Progress Notes (Signed)
? ?I, Wendy Poet, LAT, ATC, am serving as scribe for Dr. [name]. ? ?Emily Wolfe is a 66 y.o. female who presents to [place] at [facility] today for f/u R hip pain thought to be due to hip abductor tendinopathy and greater trochanteric bursitis. Pt has a hx of MS and neuropathy.  She was last seen by Dr. Georgina Snell on 02/07/21 after completing PT at [facility], completing 21 visits and noted improvement in her symptoms.  Today, pt reports worsening R hip pain over this past week for no known reason. Pt c/o increased pain when standing, walking. Pt locates pain to the lateral aspect of the R hip w/ radiating pain into the anterior aspect of the R thigh. No LBP. No numbness/tingling. ? ?Dx imaging: 11/22/20 R hip XR ? ?Pertinent review of systems: No fevers or chills ? ?Relevant historical information: Multiple sclerosis ? ? ?Exam:  ?BP (!) 162/86   Pulse 61   Ht '5\' 5"'$  (1.651 m)   Wt 140 lb 6.4 oz (63.7 kg)   SpO2 100%   BMI 23.36 kg/m?  ?General: Well Developed, well nourished, and in no acute distress.  ? ?MSK: Right hip: Normal. ?Tender palpation mildly at greater trochanter. ?Pain with flexion and internal rotation. ? ? ? ?Lab and Radiology Results ? ?X-ray images right hip and L-spine visible on CT scan abdomen and pelvis from February 2023 personally independently interpreted ? ?L-spine: Multilevel degenerative changes present lumbar spine.  No acute fractures. ? ?Right hip: Mild degenerative changes present.  No acute fractures. ? ? ?Assessment and Plan: ?66 y.o. female with  ?Right anterior hip to groin pain and pain rating down the anterior thigh.  She had a setback since improving originally with physical therapy.  The pain has recurred or returned.  Differential includes pain from intra-articular hip such as worse degenerative changes than we can see on x-ray or labrum tear or femoral acetabular impingement.  Additionally differential includes L2 or L3 lumbar radiculopathy  which is a possibility based on her L-spine appearance on CT scan. ? ?Plan for MRI arthrogram of the right hip.  If this does not show clear cause of pain would recommend MRI L-spine. ? ?Refill meloxicam for limited time for pain control. ? ? ?PDMP not reviewed this encounter. ?Orders Placed This Encounter  ?Procedures  ? MR HIP RIGHT W CONTRAST  ?  Standing Status:   Future  ?  Standing Expiration Date:   03/28/2022  ?  Scheduling Instructions:  ?   Schedule w/ Dr. Darene Lamer 1 hour prior to MRI for injection.  ?  Order Specific Question:   Reason for Exam (SYMPTOM  OR DIAGNOSIS REQUIRED)  ?  Answer:   right hip pain  ?  Order Specific Question:   If indicated for the ordered procedure, I authorize the administration of contrast media per Radiology protocol  ?  Answer:   Yes  ?  Order Specific Question:   What is the patient's sedation requirement?  ?  Answer:   No Sedation  ?  Order Specific Question:   Does the patient have a pacemaker or implanted devices?  ?  Answer:   No  ?  Order Specific Question:   Preferred imaging location?  ?  Answer:   Product/process development scientist (table limit-350lbs)  ? ?Meds ordered this encounter  ?Medications  ? meloxicam (MOBIC) 15 MG tablet  ?  Sig: Take 1 tablet (15 mg total) by mouth daily.  ?  Dispense:  14 tablet  ?  Refill:  0  ? ? ? ?Discussed warning signs or symptoms. Please see discharge instructions. Patient expresses understanding. ? ? ?The above documentation has been reviewed and is accurate and complete [name], M.D. ? ? ?

## 2021-03-27 ENCOUNTER — Ambulatory Visit: Payer: Medicare Other | Admitting: Family Medicine

## 2021-03-27 ENCOUNTER — Other Ambulatory Visit: Payer: Self-pay

## 2021-03-27 VITALS — BP 162/86 | HR 61 | Ht 65.0 in | Wt 140.4 lb

## 2021-03-27 DIAGNOSIS — G8929 Other chronic pain: Secondary | ICD-10-CM | POA: Diagnosis not present

## 2021-03-27 DIAGNOSIS — M25551 Pain in right hip: Secondary | ICD-10-CM | POA: Diagnosis not present

## 2021-03-27 MED ORDER — MELOXICAM 15 MG PO TABS: 15.0000 mg | ORAL_TABLET | Freq: Every day | ORAL | 0 refills | Status: DC

## 2021-03-27 NOTE — Patient Instructions (Addendum)
Thank you for coming in today.  ? ?You should hear from MRI scheduling within 1 week. If you do not hear please let me know.   ? ?Based on the MRI arthrogram findings we will come up with the next steps ? ? ?

## 2021-03-28 ENCOUNTER — Ambulatory Visit (INDEPENDENT_AMBULATORY_CARE_PROVIDER_SITE_OTHER): Payer: Medicare Other

## 2021-03-28 DIAGNOSIS — J309 Allergic rhinitis, unspecified: Secondary | ICD-10-CM

## 2021-03-30 ENCOUNTER — Emergency Department (HOSPITAL_BASED_OUTPATIENT_CLINIC_OR_DEPARTMENT_OTHER)
Admission: EM | Admit: 2021-03-30 | Discharge: 2021-03-30 | Disposition: A | Discharge status: Home / Self Care | Payer: Medicare Other | Attending: Emergency Medicine | Admitting: Emergency Medicine

## 2021-03-30 ENCOUNTER — Encounter (HOSPITAL_BASED_OUTPATIENT_CLINIC_OR_DEPARTMENT_OTHER): Payer: Self-pay | Admitting: Obstetrics and Gynecology

## 2021-03-30 ENCOUNTER — Other Ambulatory Visit: Payer: Self-pay

## 2021-03-30 ENCOUNTER — Emergency Department (HOSPITAL_BASED_OUTPATIENT_CLINIC_OR_DEPARTMENT_OTHER): Payer: Medicare Other

## 2021-03-30 DIAGNOSIS — Z5321 Procedure and treatment not carried out due to patient leaving prior to being seen by health care provider: Secondary | ICD-10-CM | POA: Insufficient documentation

## 2021-03-30 DIAGNOSIS — M25551 Pain in right hip: Secondary | ICD-10-CM | POA: Diagnosis not present

## 2021-03-30 IMAGING — CT CT HIP*R* W/O CM
2 of 4 series · 17 of 46 positions shown, 19 images · non-contrast
Comparison: Hip radiographs, [DATE].

CLINICAL DATA: Chronic right hip pain.  No known injury.

EXAM:
CT OF THE RIGHT HIP WITHOUT CONTRAST
TECHNIQUE: Multidetector CT imaging of the right hip was performed according to
the standard protocol. Multiplanar CT image reconstructions were
also generated.
RADIATION DOSE REDUCTION: This exam was performed according to the
departmental dose-optimization program which includes automated
exposure control, adjustment of the mA and/or kV according to
patient size and/or use of iterative reconstruction technique.

[Series 4: thin soft · axial · 0.46mm/px · z∈[+875,+1062]mm · 14 of 407 slices shown, 16 images]
[im 17/407  soft-tissue]
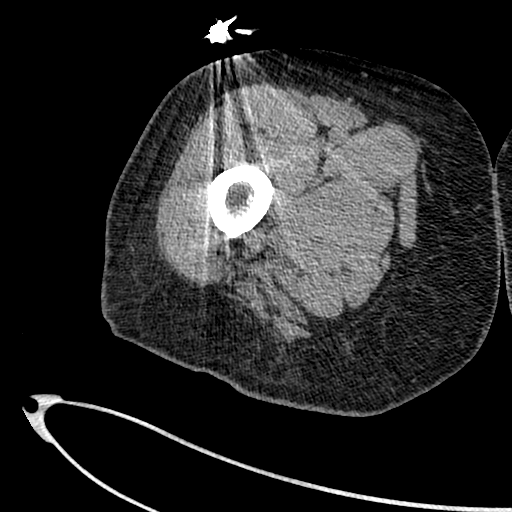
[im 17/407  bone]
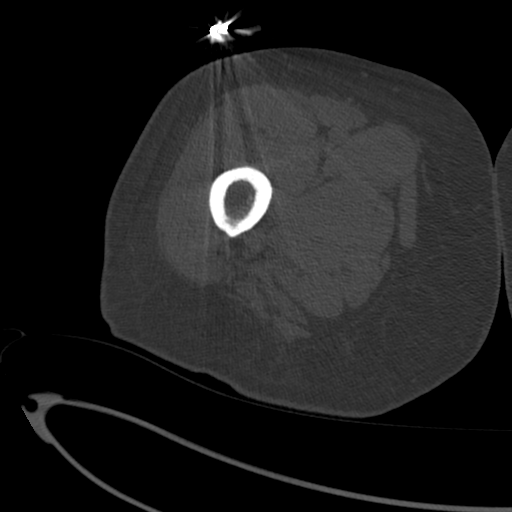
[im 49/407  soft-tissue]
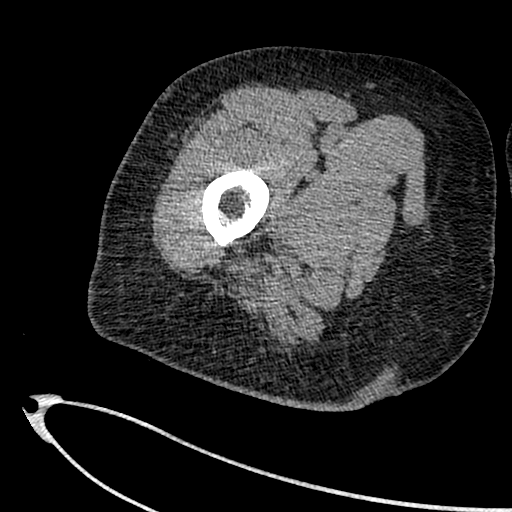
[im 82/407  soft-tissue]
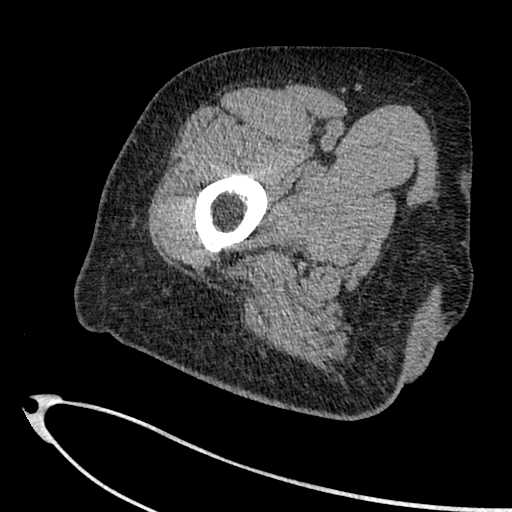
[im 114/407  soft-tissue]
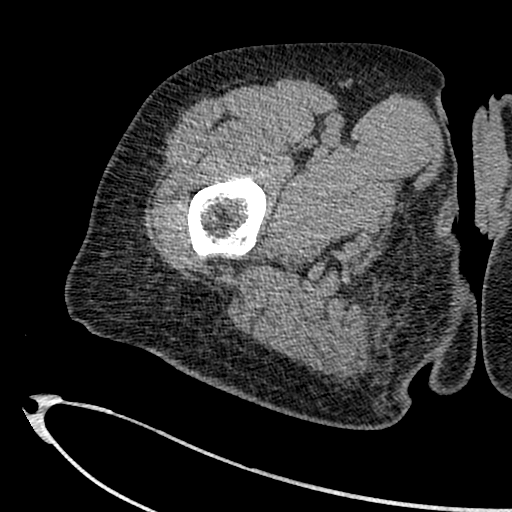
[im 130/407  soft-tissue]
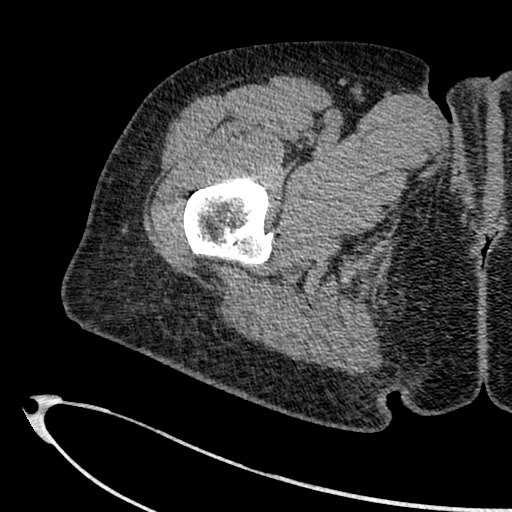
[im 163/407  soft-tissue]
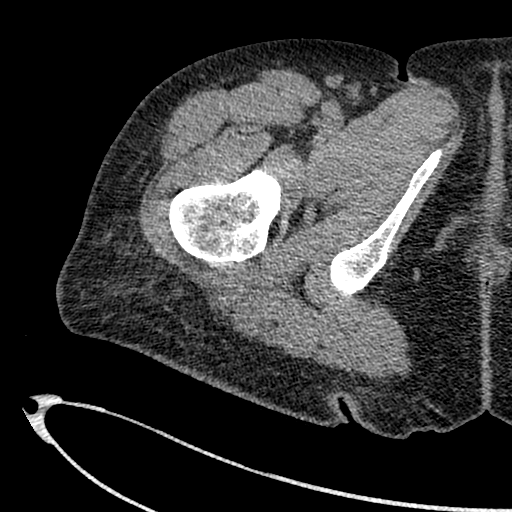
[im 195/407  soft-tissue]
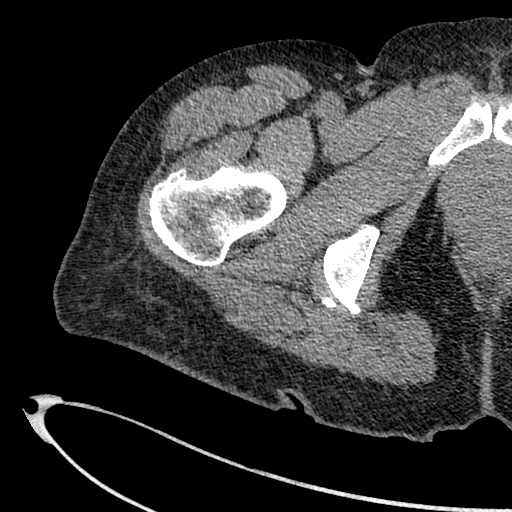
[im 212/407  soft-tissue]
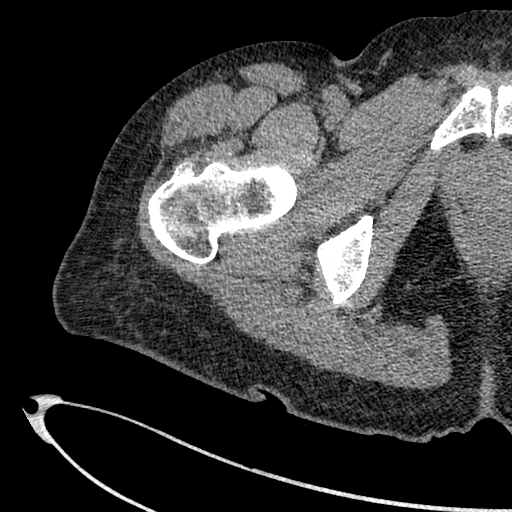
[im 244/407  soft-tissue]
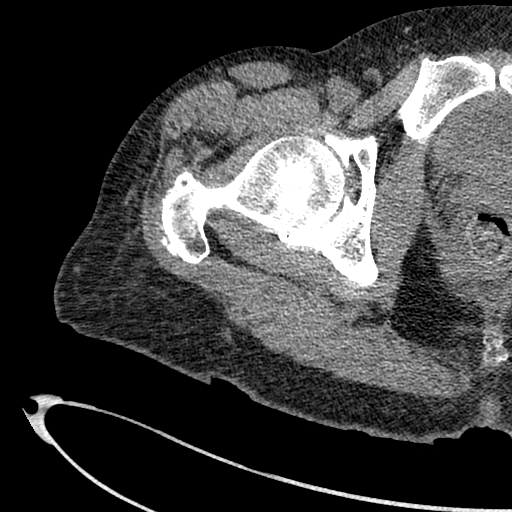
[im 244/407  bone]
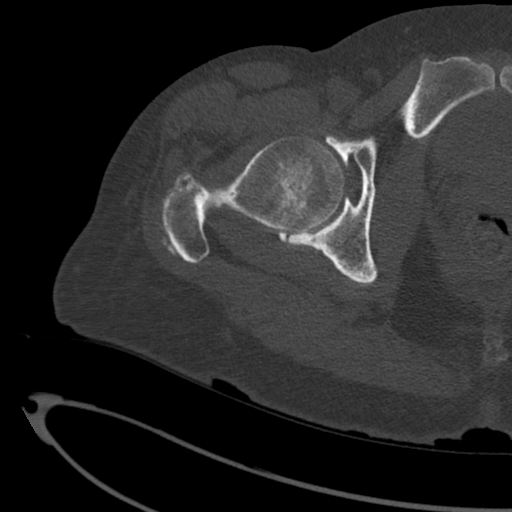
[im 277/407  soft-tissue]
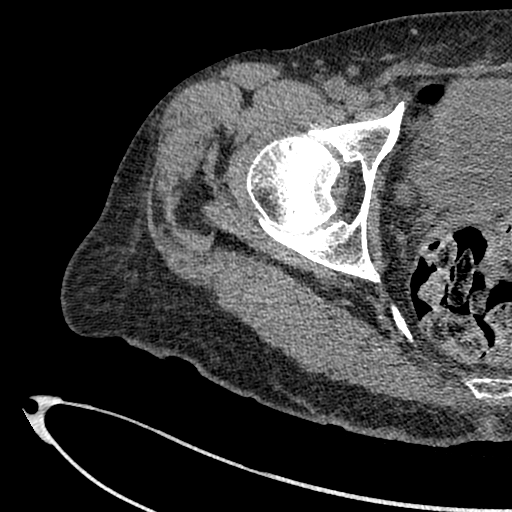
[im 309/407  soft-tissue]
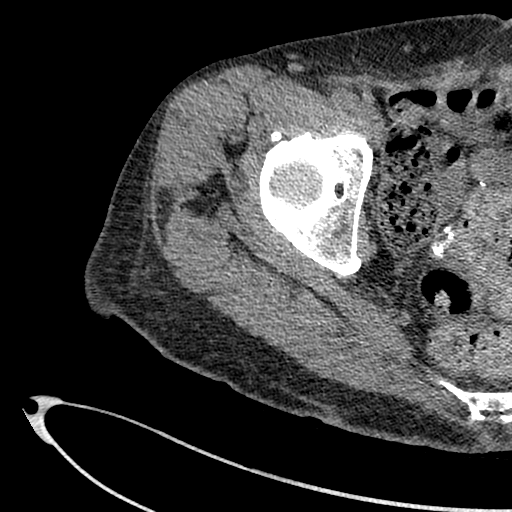
[im 325/407  soft-tissue]
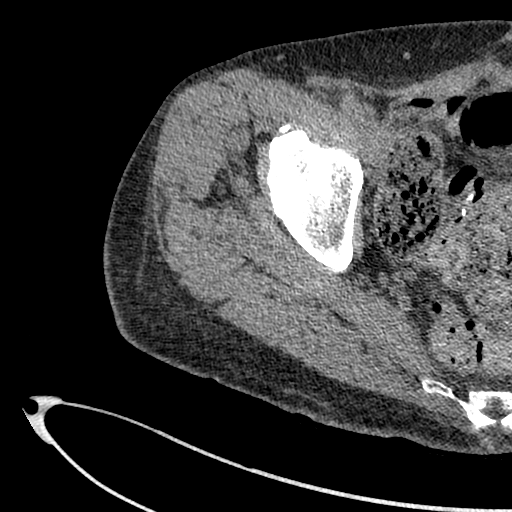
[im 358/407  soft-tissue]
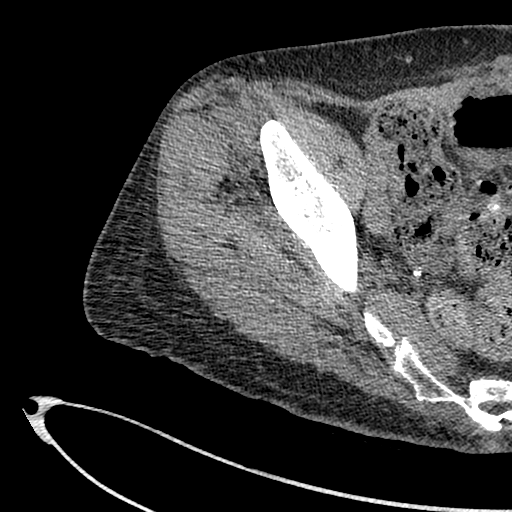
[im 390/407  soft-tissue]
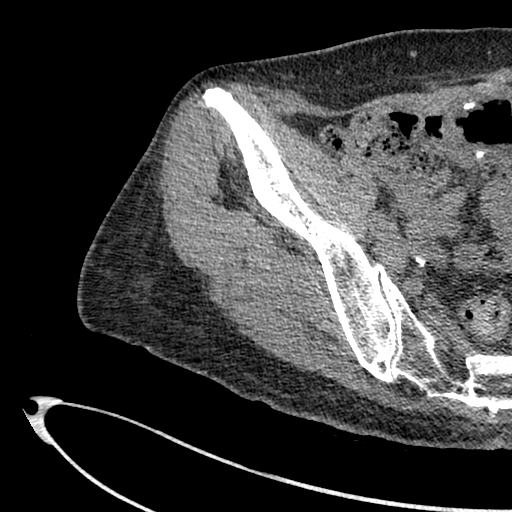

[Series 7: coronal soft · coronal · 0.39mm/px · 3 of 103 slices shown]
[im 35/103  soft-tissue]
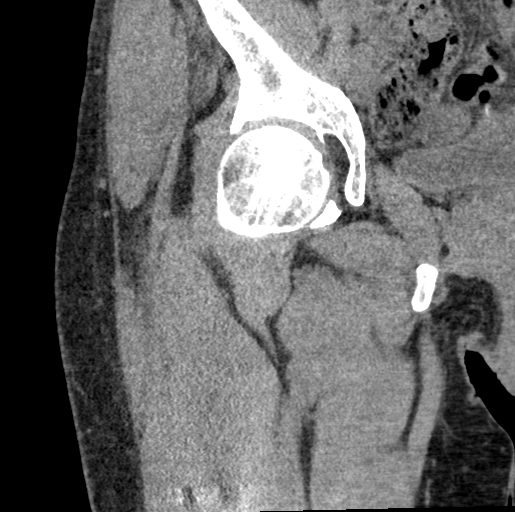
[im 46/103  soft-tissue]
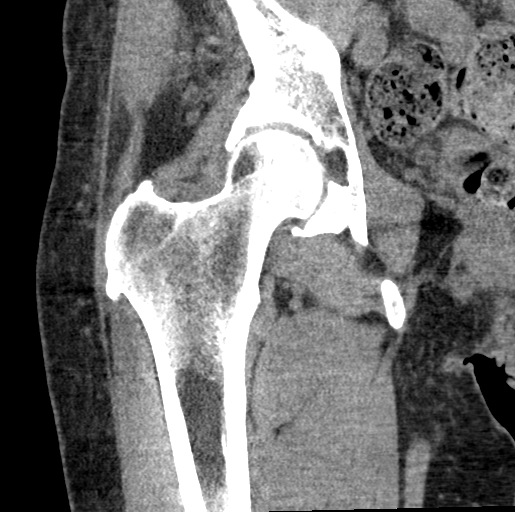
[im 57/103  soft-tissue]
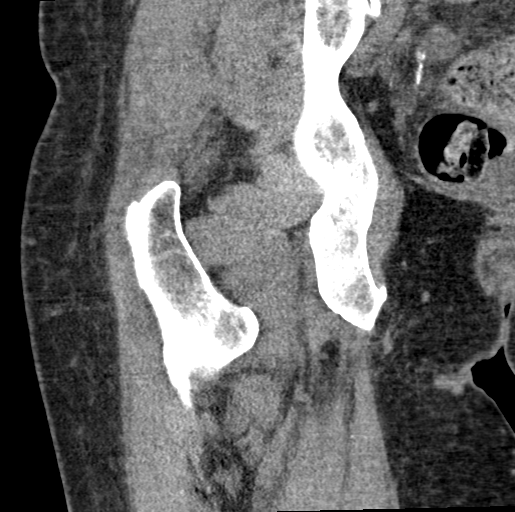

[17 of 46 positions shown; findings below may reference images not displayed]

FINDINGS: Bones/Joint/Cartilage

No fracture. 3 small foci of sclerosis in the right femoral head are
consistent with benign bone islands. No other bone lesions.

There is superolateral hip joint space narrowing with bony
prominence along the margin of the acetabulum. Minimal spurring from
the base of the femoral head. No other degenerative/arthropathic
changes.

No joint effusion.

Ligaments

Suboptimally assessed by CT.

Muscles and Tendons

Muscles and visualized tendons are unremarkable.

Soft tissues

Within the visualized pelvis, there are bowel anastomosis staples.
Mild increase stool evident in the visualized colon and rectum. No
masses. No abnormal fluid collections. No evidence of inflammation.
IMPRESSION: 1. No fracture.  No acute finding.
2. Mild right hip joint arthropathic changes consistent with
osteoarthritis.

## 2021-03-30 MED ORDER — OXYCODONE-ACETAMINOPHEN 5-325 MG PO TABS: 1.0000 | ORAL_TABLET | Freq: Four times a day (QID) | ORAL | 0 refills | Status: DC | PRN

## 2021-03-30 MED ORDER — SENNOSIDES-DOCUSATE SODIUM 8.6-50 MG PO TABS: 1.0000 | ORAL_TABLET | Freq: Every evening | ORAL | 0 refills | Status: DC | PRN

## 2021-03-30 MED ORDER — OXYCODONE-ACETAMINOPHEN 5-325 MG PO TABS
1.0000 | ORAL_TABLET | Freq: Once | ORAL | Status: AC
  Administered 2021-03-30: 1 via ORAL
  Filled 2021-03-30: qty 1

## 2021-03-30 MED ORDER — OXYCODONE-ACETAMINOPHEN 5-325 MG PO TABS
1.0000 | ORAL_TABLET | Freq: Four times a day (QID) | ORAL | 0 refills | Status: DC | PRN
Start: 2021-03-30 — End: 2021-04-22

## 2021-03-30 NOTE — Care Management (Signed)
Wheelchair ordered via [facility] to be [facility] to [facility]. Patient is active in PT awaiting CT report ?

## 2021-03-30 NOTE — Discharge Instructions (Signed)
We believe that your symptoms are caused by musculoskeletal strain.  Please read through the included information about additional care such as heating pads, over-the-counter pain medicine.  If you were provided a prescription please use it only as needed and as instructed.  Remember that early mobility and using the affected part of your body is actually better than keeping it immobile. ? ?Please follow with your sports medicine team to coordinate your outpatient MRI. ? ?Follow-up with the doctor listed as recommended or return to the emergency department with new or worsening symptoms that concern you. ? ?

## 2021-03-30 NOTE — Care Management (Signed)
?  ?  Durable Medical Equipment  ?(From admission, onward)  ?  ? ? ?  ? ?  Start     Ordered  ? 03/30/21 0724  For home use only DME standard manual wheelchair with seat cushion  Once       ?Comments: Patient suffers from right hip pain which impairs their ability to perform daily activities like bathing, dressing, grooming, and toileting in the home.  A cane, crutch, or walker will not resolve issue with performing activities of daily living. A wheelchair will allow patient to safely perform daily activities. Patient can safely propel the wheelchair in the home or has a caregiver who can provide assistance. Length of need 6 months . ?Accessories: elevating leg rests (ELRs), wheel locks, extensions and anti-tippers.  ? 03/30/21 0724  ? ?  ?  ? ?  ?  ?

## 2021-03-30 NOTE — ED Provider Notes (Signed)
? ?Emergency Department Provider Note ? ? ?I have reviewed the triage vital signs and the nursing notes. ? ? ?HISTORY ? ?Chief Complaint ?Hip Pain ? ? ?HPI ?Amela Handley is a 66 y.o. female with past medical history reviewed below presents emergency department with continued and worsening right hip pain.  Patient is followed by sports medicine and has been doing physical therapy for the hip but over the past week is noticed worsening symptoms.  She has been rolling around on a stool at home due to pain with ambulation.  No fevers.  No falls.  She is followed by sports medicine and has an order placed for MRI of the hip. Patient tried to make it through the weekend but the Mobic, which she was prescribed, has not helped her pain.  ? ? ?Past Medical History:  ?Diagnosis Date  ? Anemia   ? Angio-edema   ? Back pain   ? Bilateral swelling of feet   ? Cancer [facility])   ? Chronic pain of both upper extremities   ? Eczema   ? Gallbladder problem   ? Genital warts 11/19/2017  ? Heartburn   ? 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 History of ovarian cancer 11/19/2017  ? History of stomach ulcers   ? Hyperlipidemia 06/29/2017  ? Hypertension 06/29/2017  ? Joint pain   ? Lactose intolerance   ? Malignant neoplastic disease ([place]) 06/29/2017  ? Mild intermittent asthma without complication 13/08/6576  ? Multiple sclerosis ([place]) 06/29/2017  ? Osteoarthritis   ? Palpitations   ? Postmenopausal 06/29/2017  ? Scoliosis of lumbar spine 06/29/2017  ? Small bowel obstruction ([place]) 06/29/2017  ? Swallowing difficulty   ? Urticaria   ? 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 Vitamin D deficiency 06/29/2017  ? ? ?Review of Systems ? ?Constitutional: No fever/chills ?Cardiovascular: Denies chest pain. ?Respiratory: Denies shortness of breath. ?Gastrointestinal: No abdominal pain.  No nausea, no vomiting.  No diarrhea.  No constipation. ?Genitourinary: Negative for dysuria. ?Musculoskeletal: Negative for back  pain. Positive right hip pain.  ?Skin: Negative for rash. ?Neurological: Negative for headaches, focal weakness or numbness. ? ? ?____________________________________________ ? ? ?PHYSICAL EXAM: ? ?VITAL SIGNS: ?ED Triage Vitals [03/30/21 0710]  ?Enc Vitals Group  ?   BP (!) 160/92  ?   Pulse Rate 66  ?   Resp 17  ?   Temp 98.7 ?F (37.1 ?C)  ?   Temp Source Oral  ?   SpO2 97 %  ? ?Constitutional: Alert and oriented. Well appearing and in no acute distress. ?Eyes: Conjunctivae are normal.  ?Head: Atraumatic. ?Nose: No congestion/rhinnorhea. ?Neck: No stridor.   ?Cardiovascular: Good peripheral circulation. ?Respiratory: Normal respiratory effort.   ?Gastrointestinal:  No distention.  ?Musculoskeletal: Pain with passive ROM of the right hip. No knee or ankle tenderness.  ?Neurologic:  Normal speech and language.  ?Skin:  Skin is warm, dry and intact. No rash noted. ? ?____________________________________________ ? ? ?PROCEDURES ? ?Procedure(s) performed:  ? ?Procedures ? ?None ?____________________________________________ ? ? ?INITIAL IMPRESSION / ASSESSMENT AND PLAN / ED COURSE ? ?Pertinent labs & imaging results that were available during my care of the patient were reviewed by me and considered in my medical decision making (see chart for details). ?  ?This patient is Presenting for Evaluation of right hip pain, which does require a range of treatment options, and is a complaint that involves a high risk of morbidity and mortality. ? ?The  Differential Diagnoses include fracture, dislocation, septic joint, nerve impingement, MSK strain/tare.. ? ?Critical Interventions-  ?  ?Medications  ?oxyCODONE-acetaminophen (PERCOCET/ROXICET) 5-325 MG per tablet 1 tablet (1 tablet Oral Given 03/30/21 0818)  ? ? ?Reassessment after intervention: Pain improved slightly.  ? ? ?I decided to review pertinent External Data, and in summary patient followed by sports medicine with order in our system for outpatient MRI. ?  ? ?Radiologic  Tests Ordered, included CT right hip. I independently interpreted the images and agree with radiology interpretation.  ? ?Cardiac Monitor Tracing which shows NSR. ? ? ?Social Determinants of Health Risk patient is a non-smoker.  ? ? ?Medical Decision Making: Summary:  ?Patient presents to the emergency department for evaluation of right hip pain.  Symptoms worse with movement or standing.  No fever.  Normal pulses and sensation in the right leg.  MRI not available at this facility.  Plan for CT imaging of the right hip to rule out occult fracture.  Will coordinate for pain control here with Percocet and have ordered wheelchair DME.  ? ?Reevaluation with update and discussion with patient. CT with no acute bony abnormality. Will keep appointment for outpatient MRI.  ? ?Consult with case mgmt who have arranged for wheelchair to be delivered to patient's home.  ? ?Disposition: discharge ? ?____________________________________________ ? ?FINAL CLINICAL IMPRESSION(S) / ED DIAGNOSES ? ?Final diagnoses:  ?Right hip pain  ? ? ? ?NEW OUTPATIENT MEDICATIONS STARTED DURING THIS VISIT: ? ?Discharge Medication List as of 03/30/2021  9:07 AM  ?  ? ?START taking these medications  ? Details  ?oxyCODONE-acetaminophen (PERCOCET/ROXICET) 5-325 MG tablet Take 1 tablet by mouth every 6 (six) hours as needed for severe pain., Starting Sun 03/30/2021, Normal  ?  ?senna-docusate (SENOKOT-S) 8.6-50 MG tablet Take 1 tablet by mouth at bedtime as needed for mild constipation or moderate constipation., Starting Sun 03/30/2021, Normal  ?  ?  ? ? ?Note:  This document was prepared using Dragon voice recognition software and may include unintentional dictation errors. ? ?Nanda Quinton, MD, FACEP ?Emergency Medicine ? ?  ?Margette Fast, MD ?04/02/21 [phone] ? ?

## 2021-03-30 NOTE — ED Triage Notes (Signed)
Patient reports to the ER for right hip pain. Patient reports she cannot put weight on the hip, and states she has been doing PT for the hip. Patient reports the pain is excruciating and she is having trouble doing her ADLs ?

## 2021-04-01 ENCOUNTER — Telehealth: Payer: Self-pay | Admitting: Family Medicine

## 2021-04-01 ENCOUNTER — Ambulatory Visit (INDEPENDENT_AMBULATORY_CARE_PROVIDER_SITE_OTHER): Payer: Medicare Other | Admitting: Sports Medicine

## 2021-04-01 ENCOUNTER — Ambulatory Visit (INDEPENDENT_AMBULATORY_CARE_PROVIDER_SITE_OTHER): Payer: Medicare Other

## 2021-04-01 ENCOUNTER — Other Ambulatory Visit: Payer: Self-pay

## 2021-04-01 DIAGNOSIS — G8929 Other chronic pain: Secondary | ICD-10-CM

## 2021-04-01 DIAGNOSIS — M25551 Pain in right hip: Secondary | ICD-10-CM

## 2021-04-01 DIAGNOSIS — M1611 Unilateral primary osteoarthritis, right hip: Secondary | ICD-10-CM

## 2021-04-01 DIAGNOSIS — M7061 Trochanteric bursitis, right hip: Secondary | ICD-10-CM

## 2021-04-01 DIAGNOSIS — M25451 Effusion, right hip: Secondary | ICD-10-CM | POA: Diagnosis not present

## 2021-04-01 IMAGING — MR MR HIP*R* W/CM
6 series · 38 of 40 positions shown · IV contrast (agent unspecified)
Comparison: X-ray [DATE], CT [DATE]

CLINICAL DATA: Right hip pain for 4 months

EXAM:
MRI OF THE RIGHT HIP WITH CONTRAST (MR Arthrogram)
TECHNIQUE: Multiplanar, multisequence MR imaging of the hip was performed
immediately following contrast injection into the hip joint under
fluoroscopic guidance. No intravenous contrast was administered.

[Series 3: STIR · coronal · 4.0mm · 1.37mm/px · 6 of 36 slices shown]
[im 1/36]
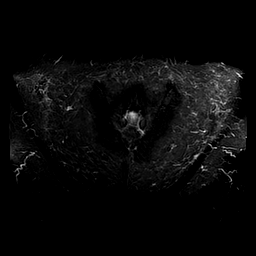
[im 6/36]
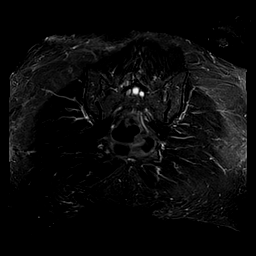
[im 11/36]
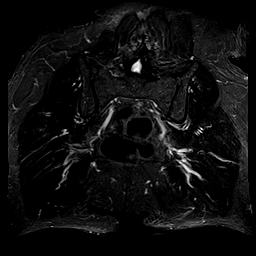
[im 16/36]
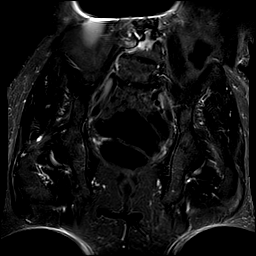
[im 21/36]
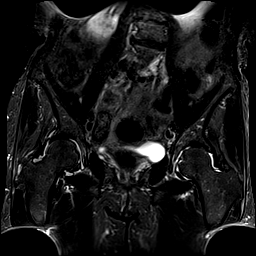
[im 26/36]
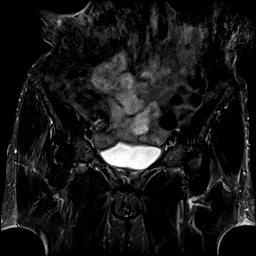

[Series 4: T1 · coronal · 4.0mm · 0.68mm/px · 8 of 36 slices shown]
[im 1/36]
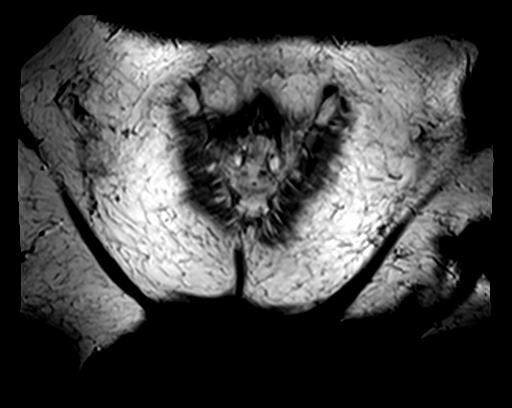
[im 6/36]
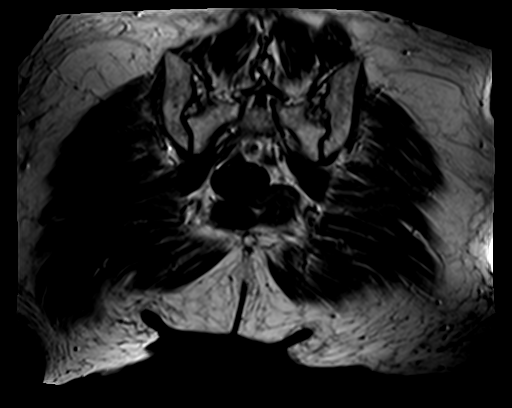
[im 11/36]
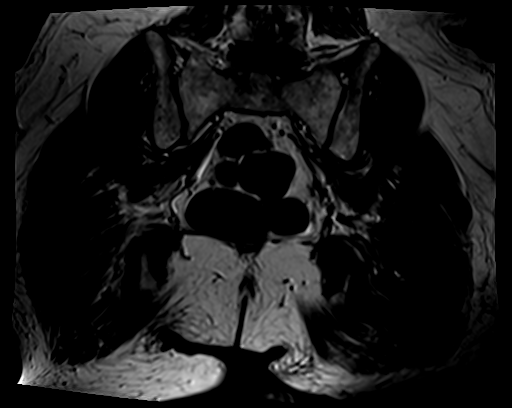
[im 16/36]
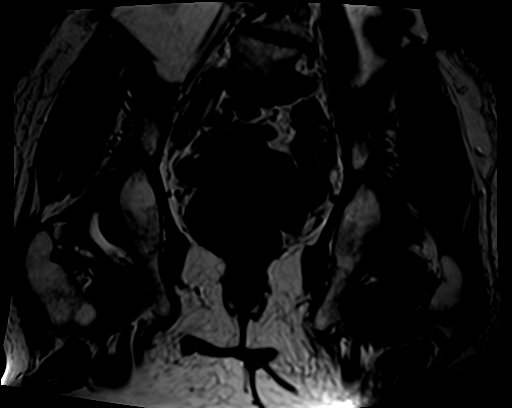
[im 21/36]
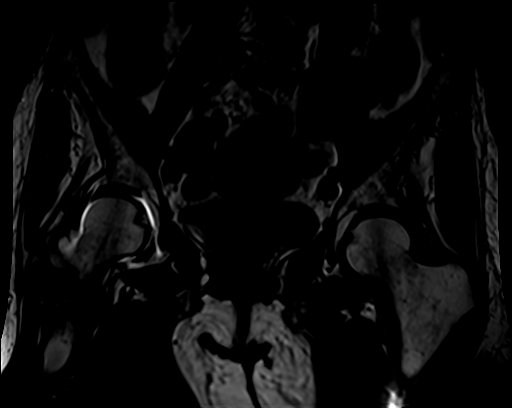
[im 26/36]
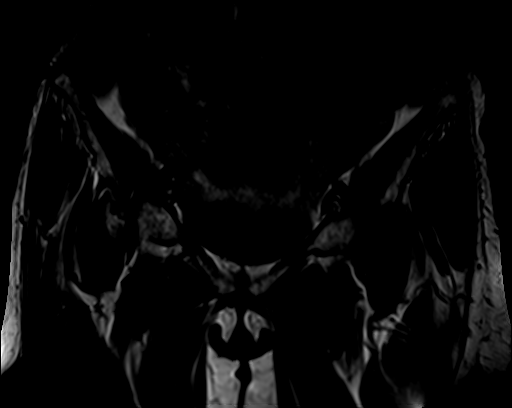
[im 31/36]
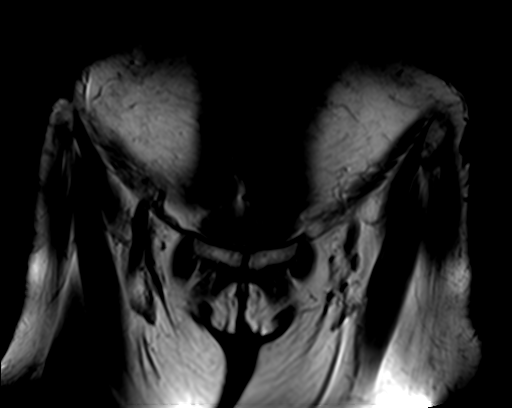
[im 36/36]
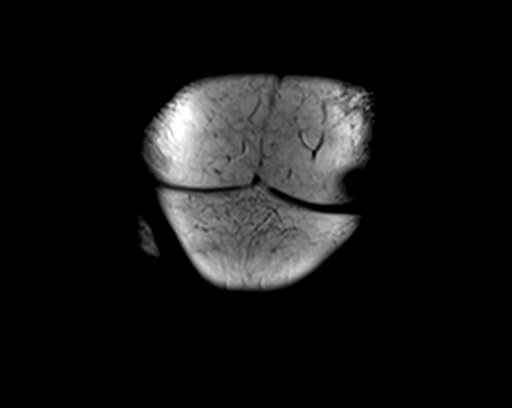

[Series 5: T2 fat-sat · axial · 3.0mm · 0.66mm/px · z∈[-54,+80]mm · 8 of 38 slices shown]
[im 1/38]
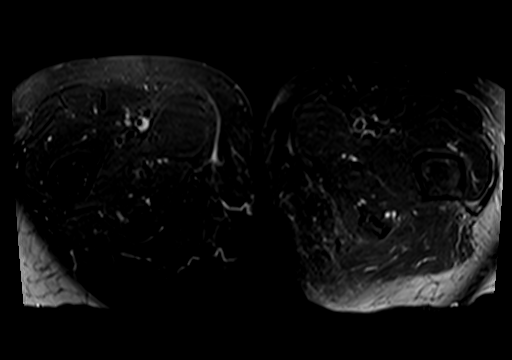
[im 6/38]
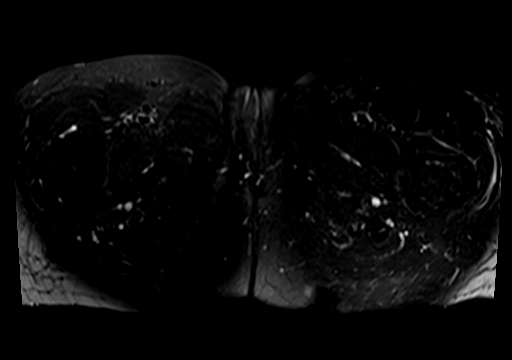
[im 11/38]
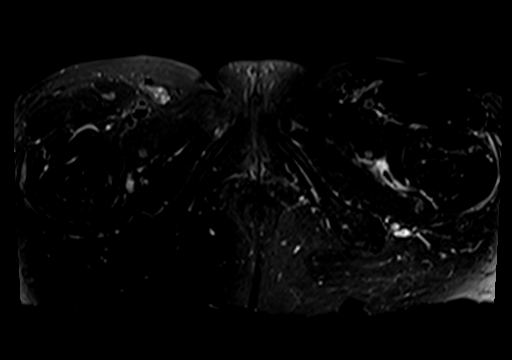
[im 16/38]
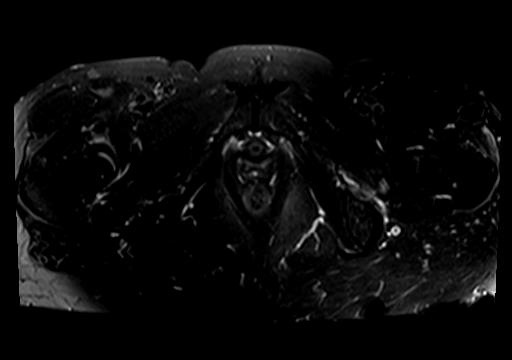
[im 22/38]
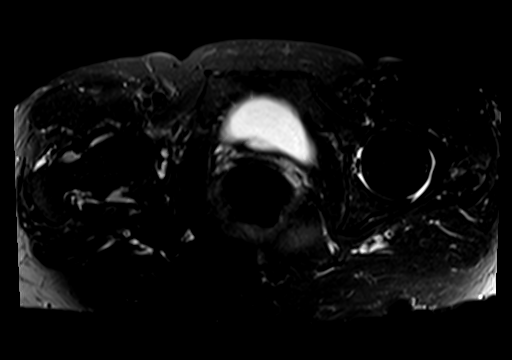
[im 27/38]
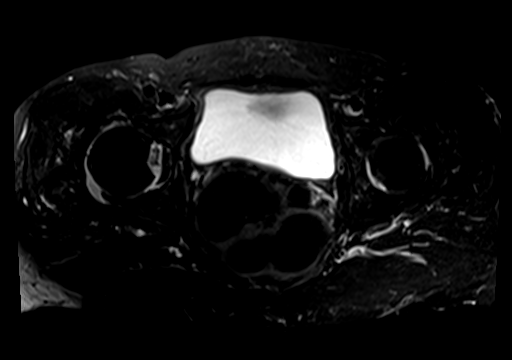
[im 32/38]
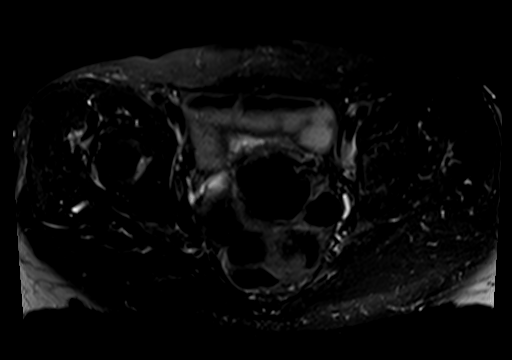
[im 38/38]
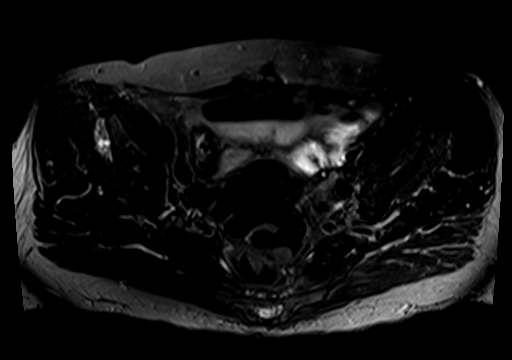

[Series 7: T1 fat-sat · axial · 4.0mm · 0.70mm/px · z∈[-81,-2]mm · 5 of 22 slices shown (1 of 3)]
[im 1/22]
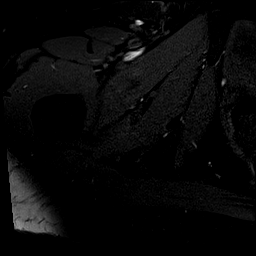
[im 6/22]
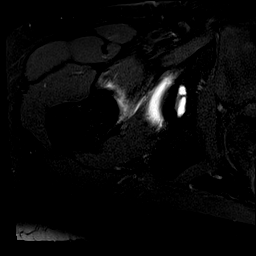
[im 11/22]
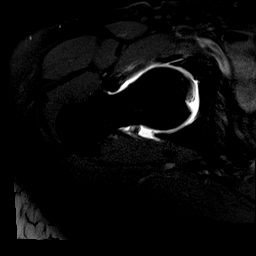
[im 16/22]
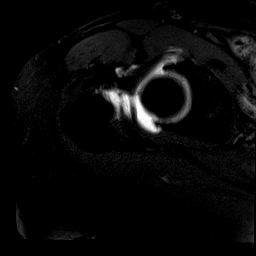
[im 22/22]
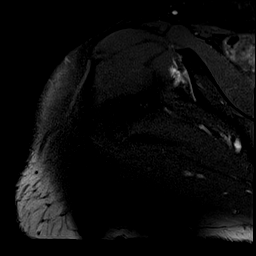

[Series 8: T1 fat-sat · coronal · 4.0mm · 0.70mm/px · 5 of 23 slices shown (2 of 3)]
[im 1/23]
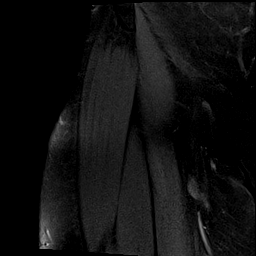
[im 6/23]
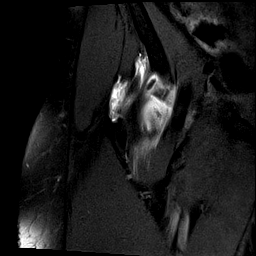
[im 12/23]
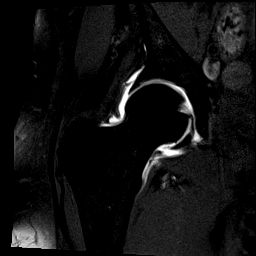
[im 17/23]
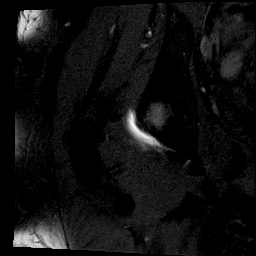
[im 23/23]
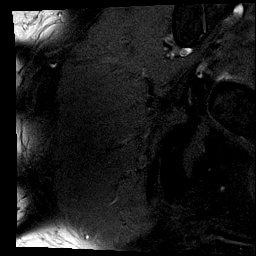

[Series 9: T1 fat-sat · sagittal · 4.0mm · 0.70mm/px · 6 of 27 slices shown (3 of 3)]
[im 1/27]
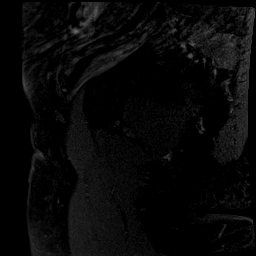
[im 6/27]
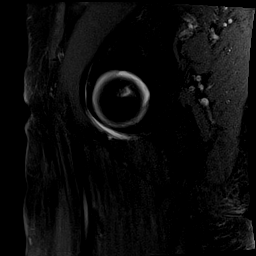
[im 11/27]
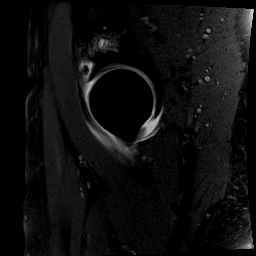
[im 16/27]
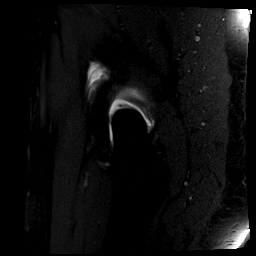
[im 21/27]
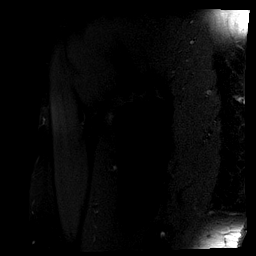
[im 27/27]
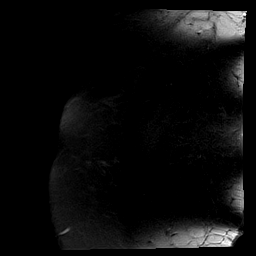

[38 of 40 positions shown; findings below may reference images not displayed]

FINDINGS: Bones: No acute fracture. No dislocation. No femoral head avascular
necrosis. Mild osteoarthritis of the left hip. Bony pelvis intact
without diastasis. Mild arthropathy of the SI joints and pubic
symphysis. Degenerative disc disease and facet arthropathy of the
included lower lumbar spine. No bone marrow edema. No marrow
replacing bone lesion.

Articular cartilage and labrum

Articular cartilage: Mild chondral thinning without focal defect. No
subchondral marrow signal changes.

Labrum:  Anterosuperior labral degeneration.

Joint or bursal effusion

Joint effusion: Joint is adequately distended with injected
contrast. 9 mm ovoid ossification along the anterosuperior margin of
the acetabulum (series 7, image 6), better seen on recent CT.

Bursae: Mild bilateral peritrochanteric bursal fluid.

Muscles and tendons

Muscles and tendons: Tendinosis of the bilateral gluteus medius and
minimus tendon with partial tearing of the right gluteus minimus
tendon. The hamstring, iliopsoas, rectus femoris, and adductor
tendons appear intact without tear or significant tendinosis. Normal
muscle bulk and signal intensity without edema, atrophy, or fatty
infiltration.

Other findings

Miscellaneous: No soft tissue edema or fluid collection. No inguinal
lymphadenopathy.
IMPRESSION: 1. Mild osteoarthritis of the bilateral hips.
2. Tendinosis of the bilateral gluteal tendons with partial tearing
of the right gluteus minimus tendon.
3. Mild bilateral peritrochanteric bursal fluid.

## 2021-04-01 MED ORDER — GADOBUTROL 1 MMOL/ML IV SOLN
1.0000 mL | Freq: Once | INTRAVENOUS | Status: AC | PRN
  Administered 2021-04-01: 1 mL via INTRAVENOUS

## 2021-04-01 NOTE — Telephone Encounter (Signed)
[facility] called to state they are needing a PA for a walker/chair for pt. PA line is [phone]. If you have questions, call [phone] ?

## 2021-04-01 NOTE — Progress Notes (Signed)
? ? ?  Procedures performed today:   ? ?Procedure: Real-time Ultrasound Guided gadolinium contrast injection of right hip joint ?Device: Samsung HS60  ?Verbal informed consent obtained.  ?Time-out conducted.  ?Noted no overlying erythema, induration, or other signs of local infection.  ?Skin prepped in a sterile fashion.  ?Local anesthesia: Topical Ethyl chloride.  ?With sterile technique and under real time ultrasound guidance: Noted arthritic hip joint.  I advanced a 22-gauge spinal needle to the femoral head/neck junction, contacted bone, I then injected 1 cc kenalog 40, 2 cc lidocaine, 2 cc bupivacaine, syringe switched and 0.1 cc gadolinium injected, syringe again switched and 10 cc sterile saline used to fully distend the joint. ?Joint visualized and capsule seen distending confirming intra-articular placement of contrast material and medication. ?Completed without difficulty  ?Advised to call if fevers/chills, erythema, induration, drainage, or persistent bleeding.  ?Images permanently stored in PACS ?Impression: Technically successful ultrasound guided gadolinium contrast injection for MR arthrography.  Please see separate MR arthrogram report. ? ?Independent interpretation of notes and tests performed by another provider:  ? ?None. ? ?Brief History, Exam, Impression, and Recommendations:   ? ?Primary osteoarthritis of right hip ?Chronic hip pain, patient referred to me for arthrogram injection for MRI, further management per primary treating provider, contrast injection performed today. ? ? ? ?___________________________________________ ?Gwen Her. Dianah Field, M.D., ABFM., CAQSM. ?Primary Care and Sports Medicine ?[place] ? ?Adjunct Instructor of Family Medicine  ?[employer] ?

## 2021-04-01 NOTE — Assessment & Plan Note (Signed)
Chronic hip pain, patient referred to me for arthrogram injection for MRI, further management per primary treating provider, contrast injection performed today. ?

## 2021-04-02 ENCOUNTER — Telehealth: Payer: Self-pay | Admitting: Physical Therapy

## 2021-04-02 ENCOUNTER — Telehealth: Payer: Self-pay | Admitting: Family Medicine

## 2021-04-02 DIAGNOSIS — G8929 Other chronic pain: Secondary | ICD-10-CM

## 2021-04-02 MED ORDER — AMBULATORY NON FORMULARY MEDICATION: 0 refills | Status: DC

## 2021-04-02 NOTE — Telephone Encounter (Signed)
Patient received a prescription for a walker from Dr [name] and already picked it up. She is no longer needing one ordered.  ?

## 2021-04-02 NOTE — Telephone Encounter (Signed)
Pt called and states that she had her R hip MRI arthrogram yesterday.  She reports that the injection helped her hip but is still having pain in her R thigh and is now complaining of R knee pain.  She notes that she's had prior knee injections.  She is unsure what her expectations should be at this point in terms of the R leg/thigh pain and if that should be improved from the hip injection.  She doesn't know if she needs to go back to PT.  Advise her that Dr. Georgina Snell will likely want to get her MRI results back first before responding.  Please advise. ?

## 2021-04-02 NOTE — Telephone Encounter (Signed)
I called her back. ?Advised scheduling with me after her MRI results are back.  Prescribed a walker. ?

## 2021-04-02 NOTE — Progress Notes (Signed)
MRI of the hip shows mild arthritis of the hip and tendinitis of the gluteus tendon with a partial tear of the right gluteus minimus tendon as well as some bursitis.   ?As we talked on the phone today recommend scheduling a follow-up appointment with me in the near future.

## 2021-04-03 ENCOUNTER — Ambulatory Visit: Payer: Medicare Other | Admitting: Family Medicine

## 2021-04-03 VITALS — BP 118/74 | HR 70 | Ht 65.0 in | Wt 141.2 lb

## 2021-04-03 DIAGNOSIS — M5416 Radiculopathy, lumbar region: Secondary | ICD-10-CM

## 2021-04-03 DIAGNOSIS — R29898 Other symptoms and signs involving the musculoskeletal system: Secondary | ICD-10-CM | POA: Diagnosis not present

## 2021-04-03 DIAGNOSIS — J302 Other seasonal allergic rhinitis: Secondary | ICD-10-CM | POA: Diagnosis not present

## 2021-04-03 DIAGNOSIS — M25561 Pain in right knee: Secondary | ICD-10-CM

## 2021-04-03 NOTE — Progress Notes (Signed)
? ?I, Wendy Poet, LAT, ATC, am serving as scribe for Dr. [name]. ? ?Emily Wolfe is a 66 y.o. 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  She was last seen by Dr. Georgina Snell on 03/27/21 c/o worsening R hip pain and was referred for an MRI arthrogram.  She also was prescribed Meloxicam.  She previously completed 21 PT sessions for these c/o at [facility] and was shown a HEP.  Today, pt reports constant pain in her R leg w/ spasms through anterior thigh. Pt is still having a hard time standing and is a good bit of pain. Pt also c/o R knee bothering her more because of her antalgic gait. ? ?Diagnostic testing: R hip MRI arthrogram- 04/01/21; R hip CT- 03/30/21; R hip XR- 11/22/20 ? ?Pertinent review of systems: No fevers or chills ? ?Relevant historical information: Hypertension.  MS. ? ? ?Exam:  ?BP 118/74   Pulse 70   Ht '5\' 5"'$  (1.651 m)   Wt 141 lb 3.2 oz (64 kg)   SpO2 98%   BMI 23.50 kg/m?  ?General: Well Developed, well nourished, and in no acute distress.  ? ?MSK: Right hip normal-appearing normal motion.  Mild tender to palpation lateral hip. ?Anterior thigh normal-appearing tender palpation mildly.  Reduced strength to hip flexion and knee extension 3+ to 4/5. ? ?Right knee mild effusion normal motion with crepitation.  Mildly tender palpation medial joint line. ? ? ? ?Lab and Radiology Results ?No results found for this or any previous visit (from the past 72 hour(s)). ?MR HIP RIGHT W CONTRAST ? ?Result Date: 04/02/2021 ?CLINICAL DATA:  Right hip pain for 4 months EXAM: MRI OF THE RIGHT HIP WITH CONTRAST (MR Arthrogram) TECHNIQUE: Multiplanar, multisequence MR imaging of the hip was performed immediately following contrast injection into the hip joint under fluoroscopic guidance. No intravenous contrast was administered. COMPARISON:  X-ray 11/22/2020, CT 03/30/2021  FINDINGS: Bones: No acute fracture. No dislocation. No femoral head avascular necrosis. Mild osteoarthritis of the left hip. Bony pelvis intact without diastasis. Mild arthropathy of the SI joints and pubic symphysis. Degenerative disc disease and facet arthropathy of the included lower lumbar spine. No bone marrow edema. No marrow replacing bone lesion. Articular cartilage and labrum Articular cartilage: Mild chondral thinning without focal defect. No subchondral marrow signal changes. Labrum:  Anterosuperior labral degeneration. Joint or bursal effusion Joint effusion: Joint is adequately distended with injected contrast. 9 mm ovoid ossification along the anterosuperior margin of the acetabulum (series 7, image 6), better seen on recent CT. Bursae: Mild bilateral peritrochanteric bursal fluid. Muscles and tendons Muscles and tendons: Tendinosis of the bilateral gluteus medius and minimus tendon with partial tearing of the right gluteus minimus tendon. The hamstring, iliopsoas, rectus femoris, and adductor tendons appear intact without tear or significant tendinosis. Normal muscle bulk and signal intensity without edema, atrophy, or fatty infiltration. Other findings Miscellaneous: No soft tissue edema or fluid collection. No inguinal lymphadenopathy. IMPRESSION: 1. Mild osteoarthritis of the bilateral hips. 2. Tendinosis of the bilateral gluteal tendons with partial tearing of the right gluteus minimus tendon. 3. Mild bilateral peritrochanteric bursal fluid. Electronically Signed   By: Davina Poke D.O.   On: 04/02/2021 11:48  ? ?[place] LIMITED JOINT SPACE STRUCTURES LOW RIGHT ? ?Result Date: 04/01/2021 ?Procedure: Real-time Ultrasound Guided gadolinium contrast injection of right hip  joint Device: Samsung HS60 Verbal informed consent obtained. Time-out conducted. Noted no overlying erythema, induration, or other signs of local infection. Skin prepped in a sterile fashion. Local anesthesia: Topical Ethyl chloride.  With sterile technique and under real time ultrasound guidance: Noted arthritic hip joint.  I advanced a 22-gauge spinal needle to the femoral head/neck junction, contacted bone, I then injected 1 cc kenalog 40, 2 cc lidocaine, 2 cc bupivacaine, syringe switched and 0.1 cc gadolinium injected, syringe again switched and 10 cc sterile saline used to fully distend the joint. Joint visualized and capsule seen distending confirming intra-articular placement of contrast material and medication. Completed without difficulty Advised to call if fevers/chills, erythema, induration, drainage, or persistent bleeding. Images permanently stored in PACS Impression: Technically successful ultrasound guided gadolinium contrast injection for MR arthrography.  Please see separate MR arthrogram report.  ? ?I, [name], personally (independently) visualized and performed the interpretation of the images attached in this note. ? ?Right anterior knee injection:  ?Consent obtained and timeout performed. ?Area medial to the patella at anterior joint line palpated and identified. ?Skin cleaned with alcohol, cold spray applied. ?A 22-gauge needle was used to access the knee joint ?'40mg'$  of Kenalog and 2 mL of Marcaine were used to inject the knee joint. ?Patient tolerated the procedure well. ? ?X-ray images L-spine visible on CT scan of abdomen and pelvis obtained on February 6 personally and independently interpreted. ?DDD L5-S1.  No acute fractures. ? ? ?Assessment and Plan: ?66 y.o. female with  ?Right anterior thigh pain.  This was thought to be related to her hip pain.  However her hip MRI does not explain sufficiently the severity of her thigh pain and degree of dysfunction and disability that she has.  She is having difficulty walking because of the weakness and pain in the anterior thigh.  The mild arthritis seen on MRI does not explain the pain that she is having.  The lateral hip abductor tendinopathy and small tear again do not  explain her pain sufficiently. ? ?Plan for MRI of the L-spine to evaluate for the possibility of L2 or 3 lumbar radiculopathy.  If this is found on the MRI we will proceed directly to epidural steroid injection. ? ?If the MRI is unrevealing we will proceed to ABI and nerve conduction study. ? ?Additionally she has some knee pain that she attributes to scooting around on a office chair due to her difficulty walking.  She has had steroid injections in the past and would like to try steroid injection today.  I think this is reasonable. ? ? ?PDMP not reviewed this encounter. ?Orders Placed This Encounter  ?Procedures  ? MR Lumbar Spine Wo Contrast  ?  Standing Status:   Future  ?  Standing Expiration Date:   04/04/2022  ?  Order Specific Question:   What is the patient's sedation requirement?  ?  Answer:   No Sedation  ?  Order Specific Question:   Does the patient have a pacemaker or implanted devices?  ?  Answer:   No  ?  Order Specific Question:   Preferred imaging location?  ?  Answer:   Product/process development scientist (table limit-350lbs)  ? ?No orders of the defined types were placed in this encounter. ? ? ? ?Discussed warning signs or symptoms. Please see discharge instructions. Patient expresses understanding. ? ? ?The above documentation has been reviewed and is accurate and complete [name], M.D. ? ? ?

## 2021-04-03 NOTE — Patient Instructions (Addendum)
Thank you for coming in today.  ? ?You received a steroid injection in your right knee today. Seek immediate medical attention if the joint becomes red, extremely painful, or is oozing fluid.  ? ?You should hear from MRI scheduling within 1 week. If you do not hear please let me know.   ? ?We will decide on a treatment plan once we get the results of the MRI ? ?

## 2021-04-03 NOTE — Progress Notes (Signed)
VIALS EXP 04-04-22 ?

## 2021-04-04 DIAGNOSIS — J301 Allergic rhinitis due to pollen: Secondary | ICD-10-CM | POA: Diagnosis not present

## 2021-04-07 ENCOUNTER — Encounter: Payer: Self-pay | Admitting: Family Medicine

## 2021-04-08 ENCOUNTER — Ambulatory Visit (INDEPENDENT_AMBULATORY_CARE_PROVIDER_SITE_OTHER): Payer: Medicare Other

## 2021-04-08 DIAGNOSIS — M5416 Radiculopathy, lumbar region: Secondary | ICD-10-CM

## 2021-04-08 DIAGNOSIS — M48061 Spinal stenosis, lumbar region without neurogenic claudication: Secondary | ICD-10-CM | POA: Diagnosis not present

## 2021-04-08 DIAGNOSIS — M5117 Intervertebral disc disorders with radiculopathy, lumbosacral region: Secondary | ICD-10-CM | POA: Diagnosis not present

## 2021-04-08 IMAGING — MR MR LUMBAR SPINE W/O CM
4 of 5 series · 25 of 48 positions shown · non-contrast
Comparison: None.

CLINICAL DATA: Lumbar radiculopathy [AJ] ([AJ]-CM).

EXAM:
MRI LUMBAR SPINE WITHOUT CONTRAST
TECHNIQUE: Multiplanar, multisequence MR imaging of the lumbar spine was
performed. No intravenous contrast was administered.

[Series 3: T2 · sagittal · 4.0mm · 0.81mm/px · 6 of 15 slices shown (1 of 2)]
[im 1/15]
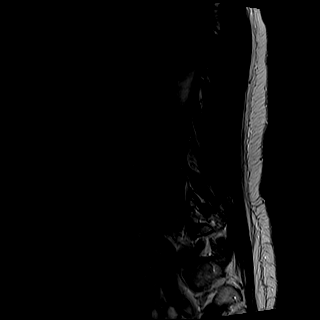
[im 3/15]
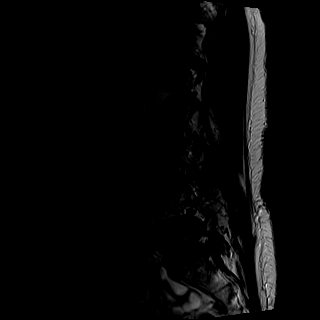
[im 6/15]
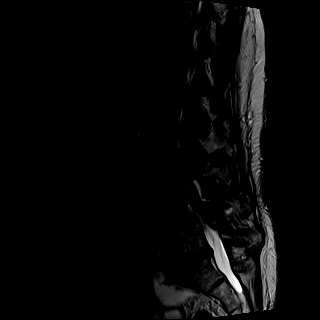
[im 9/15]
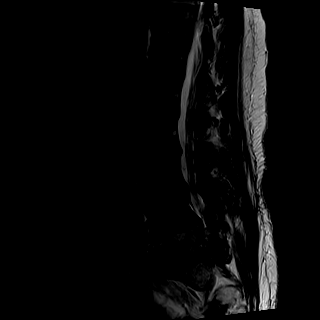
[im 12/15]
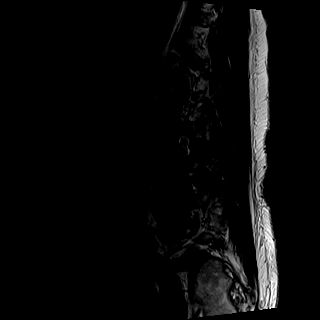
[im 15/15]
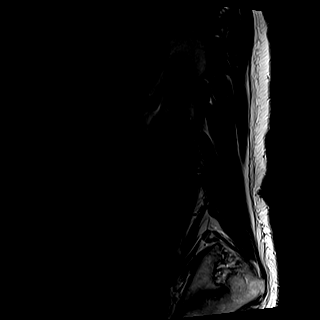

[Series 4: T1 · sagittal · 4.0mm · 0.41mm/px · 6 of 15 slices shown (1 of 2)]
[im 1/15]
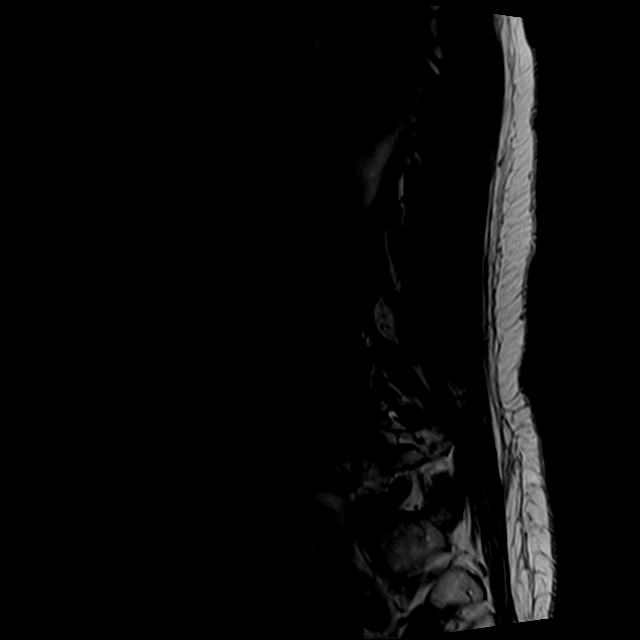
[im 3/15]
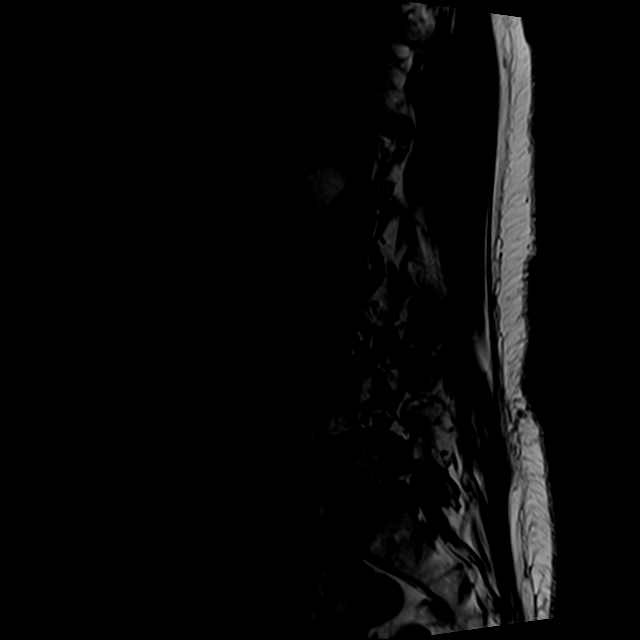
[im 6/15]
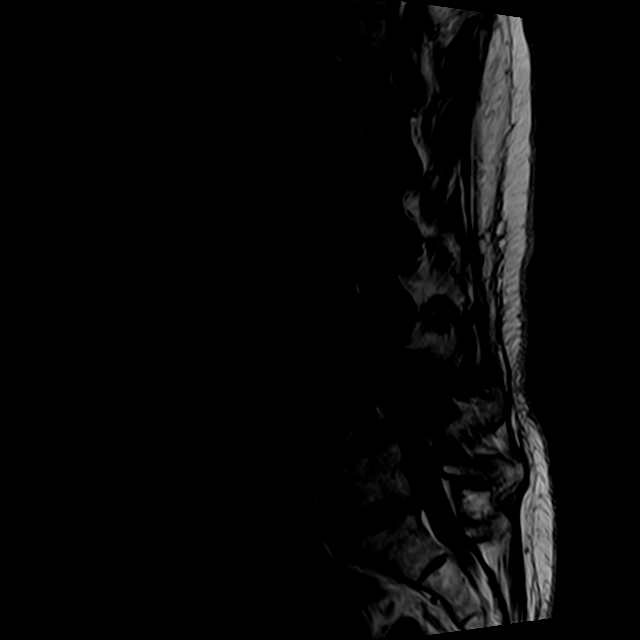
[im 9/15]
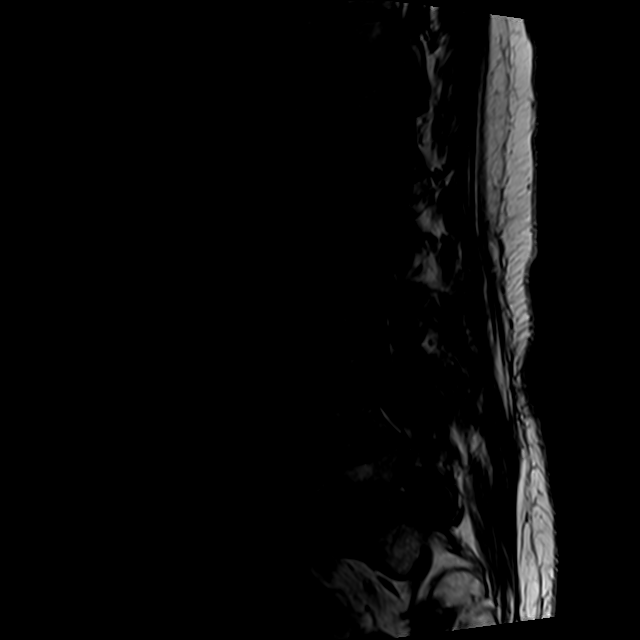
[im 12/15]
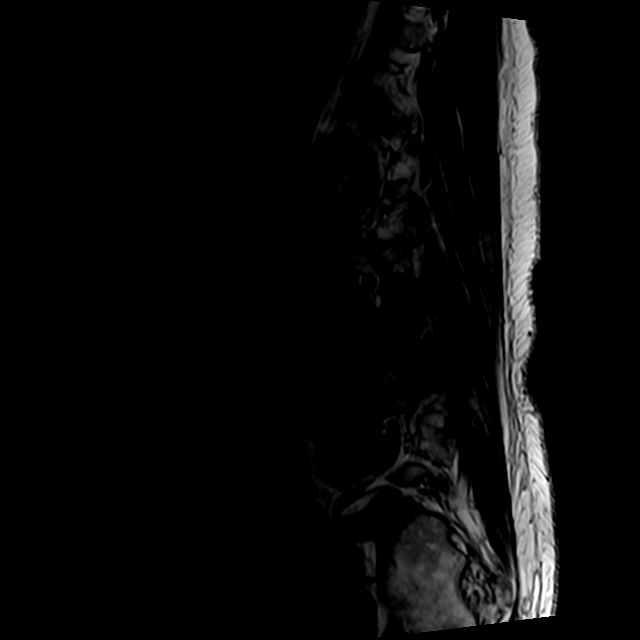
[im 15/15]
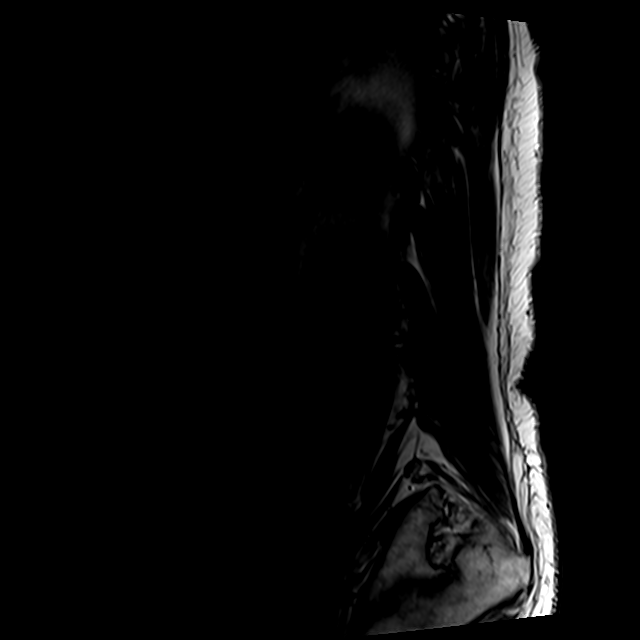

[Series 6: T2 · axial · 4.0mm · 0.78mm/px · z∈[-123,+70]mm · 9 of 38 slices shown (2 of 2)]
[im 1/38]
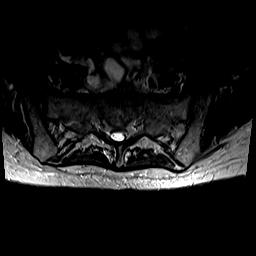
[im 6/38]
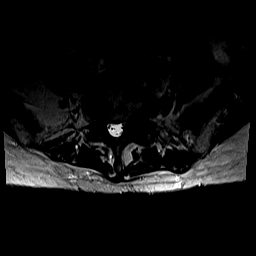
[im 11/38]
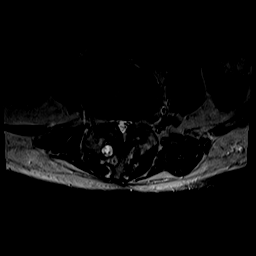
[im 16/38]
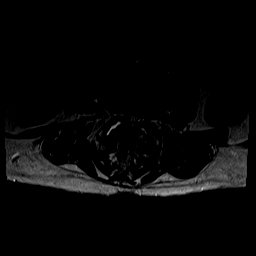
[im 19/38]
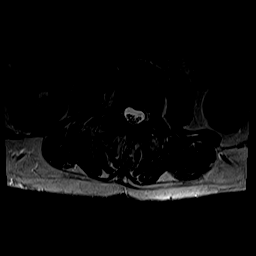
[im 22/38]
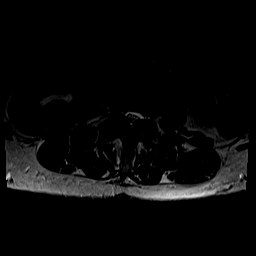
[im 27/38]
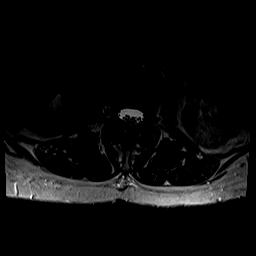
[im 32/38]
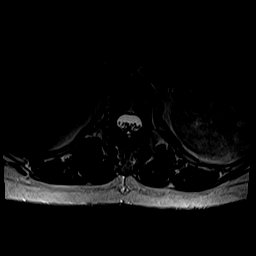
[im 38/38]
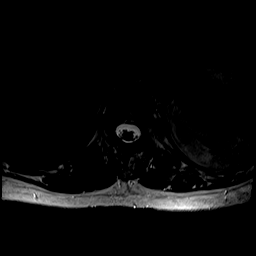

[Series 7: T1 · axial · 4.0mm · 0.39mm/px · z∈[-123,+40]mm · 4 of 38 slices shown (2 of 2)]
[im 1/38]
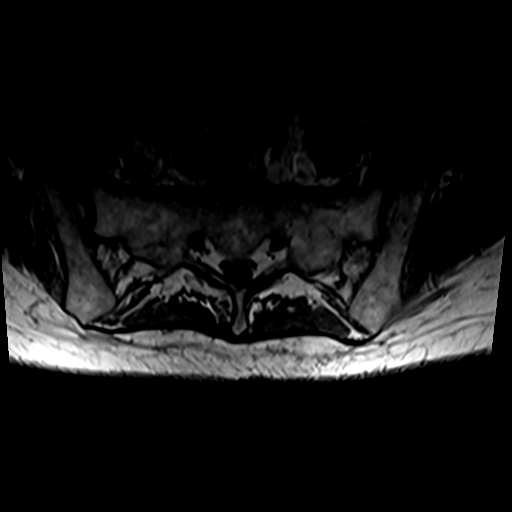
[im 6/38]
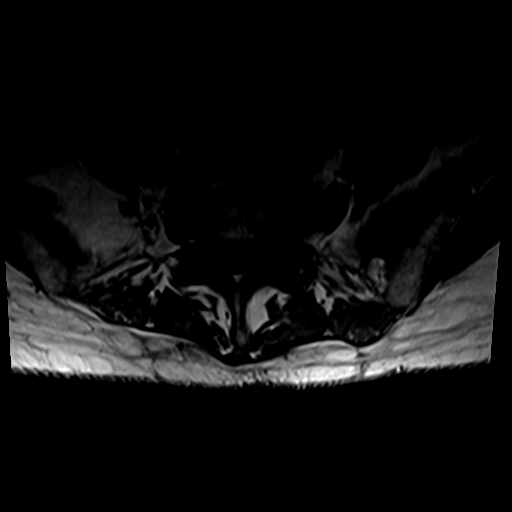
[im 19/38]
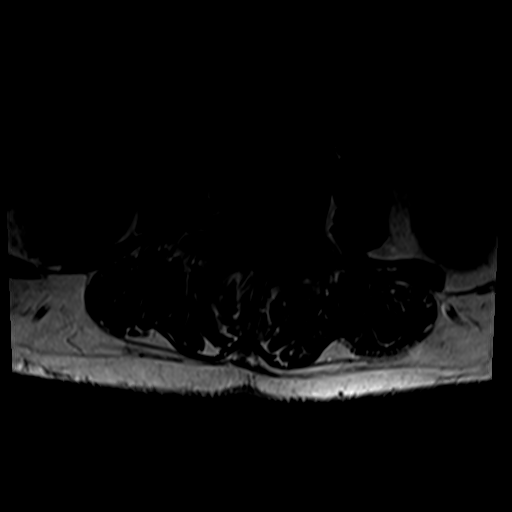
[im 32/38]
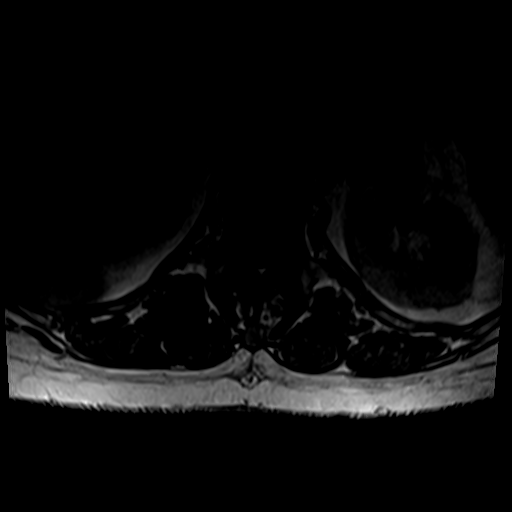

[25 of 48 positions shown; findings below may reference images not displayed]

FINDINGS: Segmentation:  Standard.

Alignment: Left convex scoliosis. Small anterolisthesis of L3 over
L4 and L4 over L5.

Vertebrae:  No fracture, evidence of discitis, or bone lesion.

Conus medullaris and cauda equina: Conus extends to the L1 level.
Conus and cauda equina appear normal.

Paraspinal and other soft tissues: Negative.

Disc levels:

T12-L1: No spinal canal or neural foraminal stenosis.

L1-2: Shallow disc bulge and moderate facet degenerative changes
ligamentum flavum redundancy without significant spinal canal or
neural foraminal stenosis.

L2-3: Loss of disc height, disc bulge, moderate facet degenerative
changes and ligamentum flavum redundancy resulting in mild spinal
canal stenosis with narrowing of the bilateral subarticular zones,
right greater than left, severe right and mild left neural foraminal
narrowing.

L3-4: Disc bulge, prominent hypertrophic facet degenerative changes
with mild bilateral joint effusion and ligamentum flavum redundancy
resulting in severe spinal canal stenosis, severe right and moderate
left neural foraminal narrowing.

L4-5: Partial endplate fusion, disc bulge and prominent hypertrophic
facet degenerative changes with facet fusion. Findings result in
moderate spinal canal stenosis with narrowing of the bilateral
subarticular zones and moderate bilateral neural foraminal
narrowing.

L5-S1: Disc bulge with prominent left foraminal/far lateral disc
osteophyte complex and moderate hypertrophic facet degenerative
changes. Findings result in narrowing of the bilateral subarticular
zones, moderate right and severe left neural foraminal narrowing.
IMPRESSION: 1. Severe spinal canal stenosis at L3-4 with severe right and
moderate left neural foraminal narrowing.
2. Moderate spinal canal stenosis at L4-5 with moderate bilateral
neural foraminal narrowing.
3. Narrowing of the bilateral subarticular zones at L5-S1 with
moderate right and severe left neural foraminal narrowing.
4. Severe right neural foraminal narrowing at L2-3.

## 2021-04-09 ENCOUNTER — Encounter: Payer: Self-pay | Admitting: Family Medicine

## 2021-04-09 ENCOUNTER — Ambulatory Visit (INDEPENDENT_AMBULATORY_CARE_PROVIDER_SITE_OTHER): Payer: Medicare Other

## 2021-04-09 ENCOUNTER — Telehealth: Payer: Self-pay | Admitting: Family Medicine

## 2021-04-09 DIAGNOSIS — J309 Allergic rhinitis, unspecified: Secondary | ICD-10-CM | POA: Diagnosis not present

## 2021-04-09 DIAGNOSIS — M5416 Radiculopathy, lumbar region: Secondary | ICD-10-CM

## 2021-04-09 DIAGNOSIS — R29898 Other symptoms and signs involving the musculoskeletal system: Secondary | ICD-10-CM

## 2021-04-09 NOTE — Telephone Encounter (Signed)
Please see note and advise  

## 2021-04-09 NOTE — Telephone Encounter (Signed)
Epidural steroid injection ordered 

## 2021-04-09 NOTE — Progress Notes (Signed)
MRI shows severe spinal stenosis and pinched nerves at several levels.  You do have a pinched nerve that absolutely could cause the pain and weakness that you are experiencing in your right thigh. ?I have ordered an epidural steroid injection.  Please contact [facility] at [phone] or [phone] to schedule this.  I recommend that you follow-up with me after your injection to go over the results in full detail and to talk about how you are feeling.  If your weakness is progressing and you are getting worse I would recommend that we refer you directly to a neurosurgeon to talk about back surgery.

## 2021-04-10 MED ORDER — GABAPENTIN 300 MG PO CAPS: 300.0000 mg | ORAL_CAPSULE | Freq: Three times a day (TID) | ORAL | 3 refills | Status: DC | PRN

## 2021-04-10 NOTE — Telephone Encounter (Signed)
I appreciate the update. She is good hands with Dr Georgina Snell. ? ?Algis Greenhouse. Jerline Pain, MD ?04/10/2021 9:08 AM  ? ?

## 2021-04-16 ENCOUNTER — Ambulatory Visit
Admission: RE | Admit: 2021-04-16 | Discharge: 2021-04-16 | Disposition: A | Discharge status: Home / Self Care | Payer: Medicare Other | Source: Ambulatory Visit | Attending: Family Medicine | Admitting: Family Medicine

## 2021-04-16 ENCOUNTER — Other Ambulatory Visit: Payer: Self-pay | Admitting: Family Medicine

## 2021-04-16 DIAGNOSIS — M5416 Radiculopathy, lumbar region: Secondary | ICD-10-CM

## 2021-04-16 DIAGNOSIS — R29898 Other symptoms and signs involving the musculoskeletal system: Secondary | ICD-10-CM

## 2021-04-16 IMAGING — XA DG EPIDURAL NERVE ROOT
2 series · 2 of 2 positions shown · non-contrast
Comparison: MRI lumbar spine [DATE]

CLINICAL DATA: 65-year-old woman with pain predominantly in the
anterior right thigh extending to the level of the right shin. She
denies any significant back pain or right foot symptoms.

EXAM:
EPIDURAL/NERVE ROOT
FLUOROSCOPY:
Radiation Exposure Index (as provided by the fluoroscopic device):
8.7 mGy Kerma
TECHNIQUE: The overlying skin was prepped with Betadine and draped in sterile
fashion. Skin anesthesia was carried [DATE]% Lidocaine. A
curved 22 gauge spinal needle was directed into the superior ventral
neural foramen on the right at L4.

[Series 1: ortho standard · 1 of 1 slices shown (1 of 2)]
[im 1/1]
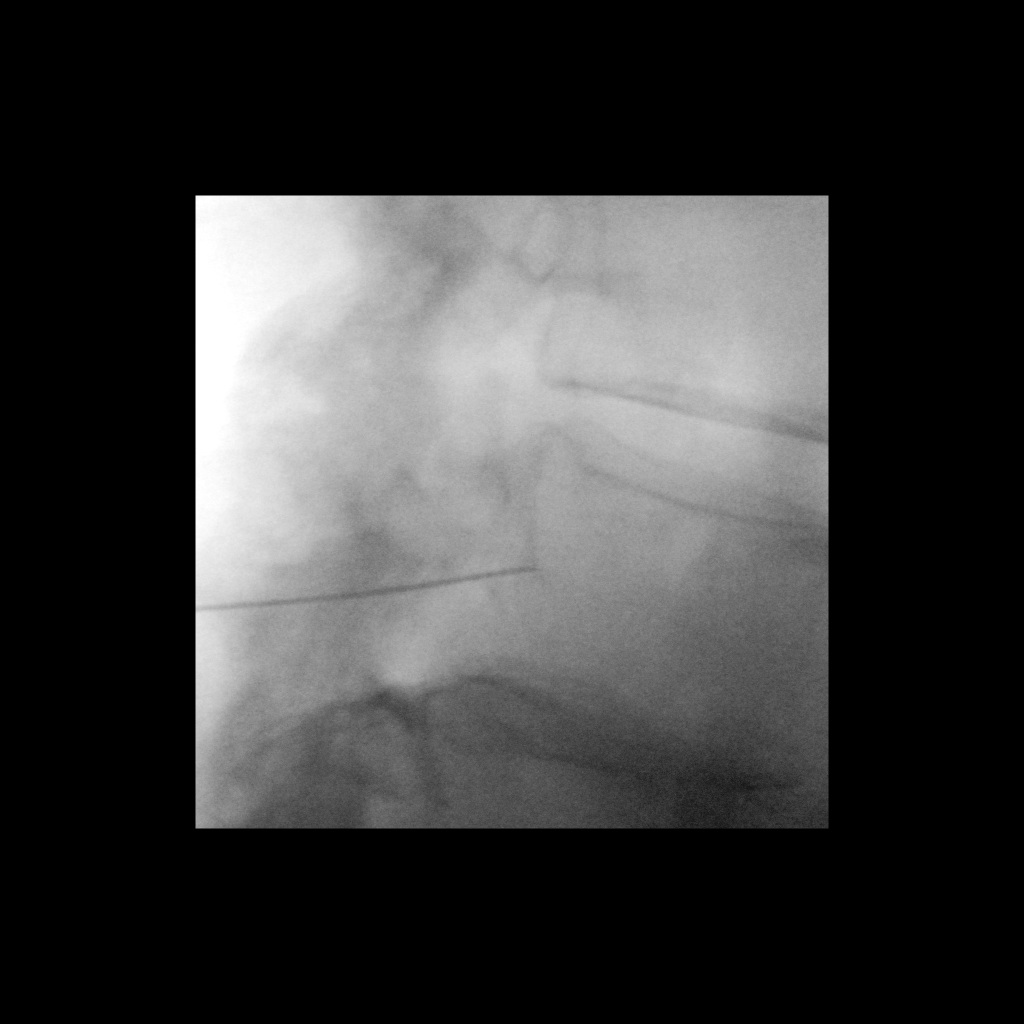

[Series 2: ortho standard · 1 of 1 slices shown (2 of 2)]
[im 1/1]
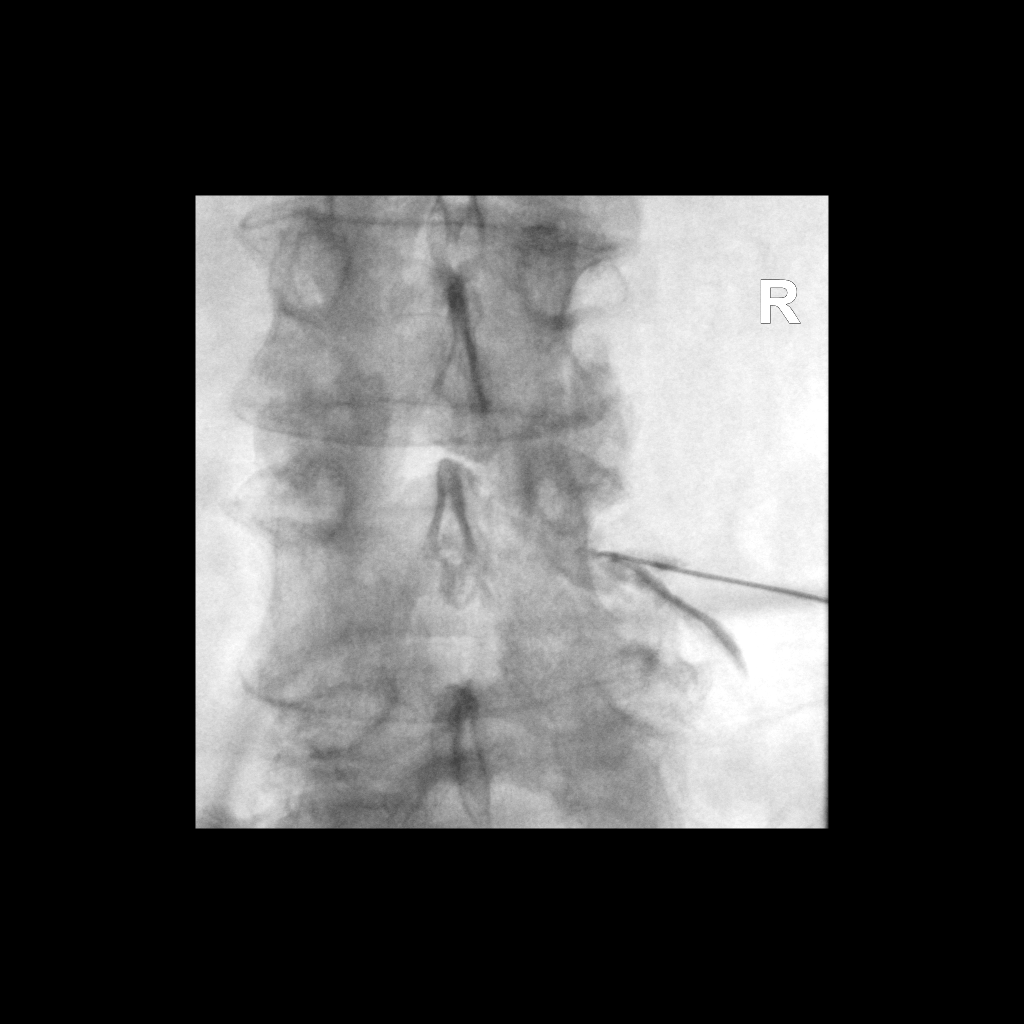

[2 of 2 positions shown; findings below may reference images not displayed]

Injection of a small amount of Isovue 200 demonstrates spread
outlining the right L4 nerve root. No intravascular extension. 80 mg
Depo-Medrol and 2 mL of 1% Lidocaine were subsequently administered.
The patient tolerated the procedure without difficulty and was
transferred to recovery in excellent condition.
IMPRESSION: Technically successful right L4 selective nerve root block and
transforaminal epidural steroid injection.

## 2021-04-16 MED ORDER — METHYLPREDNISOLONE ACETATE 40 MG/ML INJ SUSP (RADIOLOG
80.0000 mg | Freq: Once | INTRAMUSCULAR | Status: AC
  Administered 2021-04-16: 80 mg via EPIDURAL

## 2021-04-16 MED ORDER — IOPAMIDOL (ISOVUE-M 200) INJECTION 41%
1.0000 mL | Freq: Once | INTRAMUSCULAR | Status: AC
  Administered 2021-04-16: 1 mL via EPIDURAL

## 2021-04-16 NOTE — Discharge Instructions (Signed)

## 2021-04-21 ENCOUNTER — Encounter: Payer: Self-pay | Admitting: Family Medicine

## 2021-04-21 DIAGNOSIS — R29898 Other symptoms and signs involving the musculoskeletal system: Secondary | ICD-10-CM

## 2021-04-21 DIAGNOSIS — M5416 Radiculopathy, lumbar region: Secondary | ICD-10-CM

## 2021-04-22 MED ORDER — OXYCODONE-ACETAMINOPHEN 5-325 MG PO TABS: 1.0000 | ORAL_TABLET | Freq: Four times a day (QID) | ORAL | 0 refills | Status: DC | PRN

## 2021-04-23 MED ORDER — OXYCODONE-ACETAMINOPHEN 10-325 MG PO TABS: 1.0000 | ORAL_TABLET | Freq: Three times a day (TID) | ORAL | 0 refills | Status: DC | PRN

## 2021-04-23 NOTE — Addendum Note (Signed)
Addended by: Gregor Hams on: 04/23/2021 03:54 PM ? ? Modules accepted: Orders ? ?

## 2021-04-23 NOTE — Addendum Note (Signed)
Addended by: Gregor Hams on: 04/23/2021 11:49 AM ? ? Modules accepted: Orders ? ?

## 2021-04-25 ENCOUNTER — Telehealth: Payer: Self-pay | Admitting: *Deleted

## 2021-04-25 MED ORDER — OXYCODONE-ACETAMINOPHEN 10-325 MG PO TABS: 1.0000 | ORAL_TABLET | Freq: Three times a day (TID) | ORAL | 0 refills | Status: DC | PRN

## 2021-04-25 NOTE — Addendum Note (Signed)
Addended by: Gregor Hams on: 04/25/2021 07:58 AM ? ? Modules accepted: Orders ? ?

## 2021-04-25 NOTE — Telephone Encounter (Signed)
Pt called & left msg stating that she went to pick up her oxycodone yesterday at [facility] but they do not have any in stock until today at 4pm. Pt states she cannot wait that long & would like a new rx sent to [facility] [address]. She has already called & they do have it in stock.  ?

## 2021-04-25 NOTE — Telephone Encounter (Signed)
Pt also sent message through MyChart and Dr. Georgina Snell has placed new rx to [facility] [place]. ?

## 2021-04-28 ENCOUNTER — Encounter: Payer: Self-pay | Admitting: Family Medicine

## 2021-04-28 NOTE — Telephone Encounter (Signed)
See note

## 2021-04-29 NOTE — Telephone Encounter (Signed)
I am sorry to hear that her pain is worsening.   ? ?Can we clarify what it is that we can do to help?  Is she requesting an order for a wheelchair? I am happy to write an order if that is what she wants I just want to make sure we are doing what she is requesting. ? ?Algis Greenhouse. Jerline Pain, MD ?04/29/2021 9:33 AM  ? ?

## 2021-04-30 ENCOUNTER — Encounter: Payer: Self-pay | Admitting: Family Medicine

## 2021-04-30 DIAGNOSIS — G35 Multiple sclerosis: Secondary | ICD-10-CM | POA: Diagnosis not present

## 2021-04-30 DIAGNOSIS — M48061 Spinal stenosis, lumbar region without neurogenic claudication: Secondary | ICD-10-CM | POA: Diagnosis not present

## 2021-04-30 NOTE — Discharge Instructions (Signed)

## 2021-05-01 ENCOUNTER — Other Ambulatory Visit: Payer: Self-pay | Admitting: Family Medicine

## 2021-05-01 ENCOUNTER — Ambulatory Visit
Admission: RE | Admit: 2021-05-01 | Discharge: 2021-05-01 | Disposition: A | Discharge status: Home / Self Care | Payer: Medicare Other | Source: Ambulatory Visit | Attending: Family Medicine | Admitting: Family Medicine

## 2021-05-01 DIAGNOSIS — M5416 Radiculopathy, lumbar region: Secondary | ICD-10-CM | POA: Diagnosis not present

## 2021-05-01 DIAGNOSIS — M4316 Spondylolisthesis, lumbar region: Secondary | ICD-10-CM | POA: Diagnosis not present

## 2021-05-01 DIAGNOSIS — M4727 Other spondylosis with radiculopathy, lumbosacral region: Secondary | ICD-10-CM | POA: Diagnosis not present

## 2021-05-01 DIAGNOSIS — R29898 Other symptoms and signs involving the musculoskeletal system: Secondary | ICD-10-CM

## 2021-05-01 IMAGING — XA DG EPIDURAL NERVE ROOT
2 series · 2 of 2 positions shown · non-contrast
Comparison: none

CLINICAL DATA: 65-year-old female with lumbosacral spondylosis
without myelopathy. She has a severe right L4 radiculopathy. Prior
right L4 nerve root block performed [DATE] resulted in only 10%
symptom relief. She presents today for repeat injection.

[Series 1: ortho standard · 1 of 1 slices shown (1 of 2)]
[im 1/1]
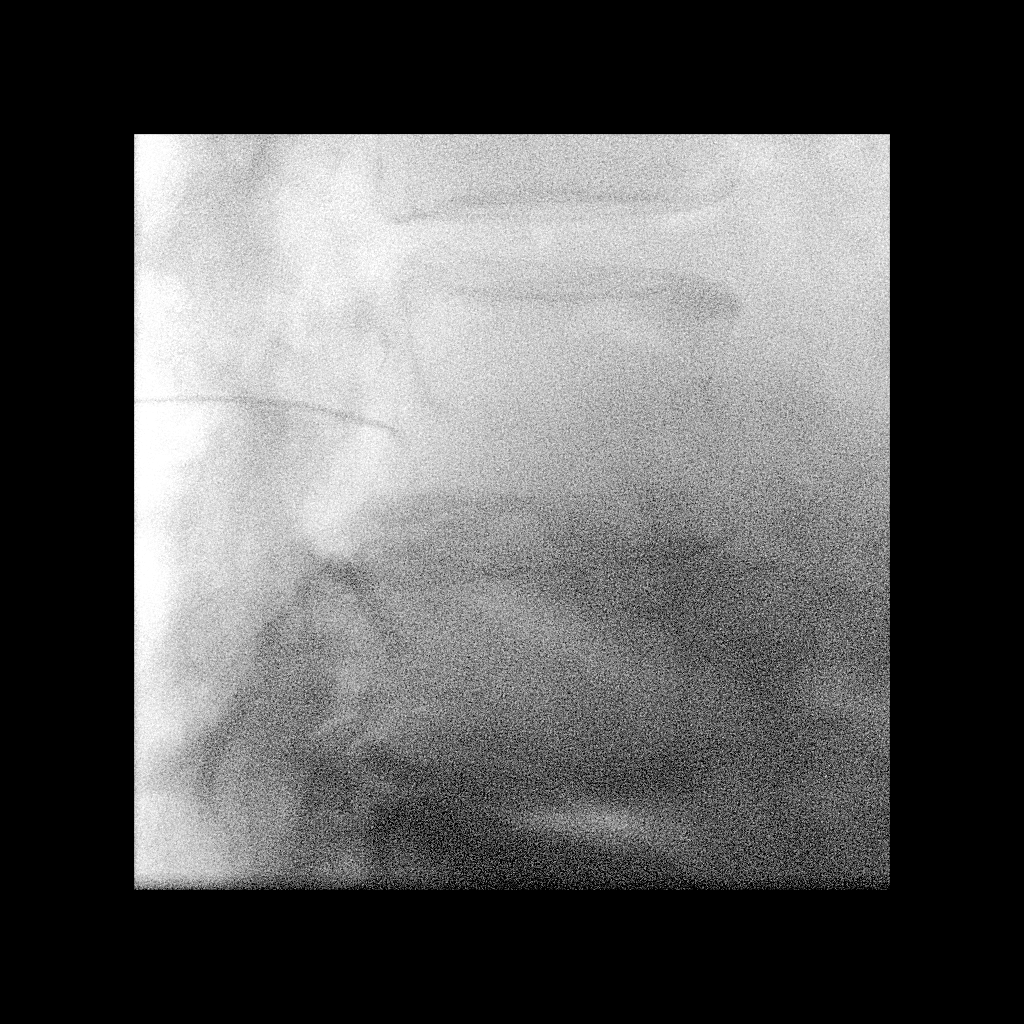

[Series 2: ortho standard · 1 of 1 slices shown (2 of 2)]
[im 1/1]
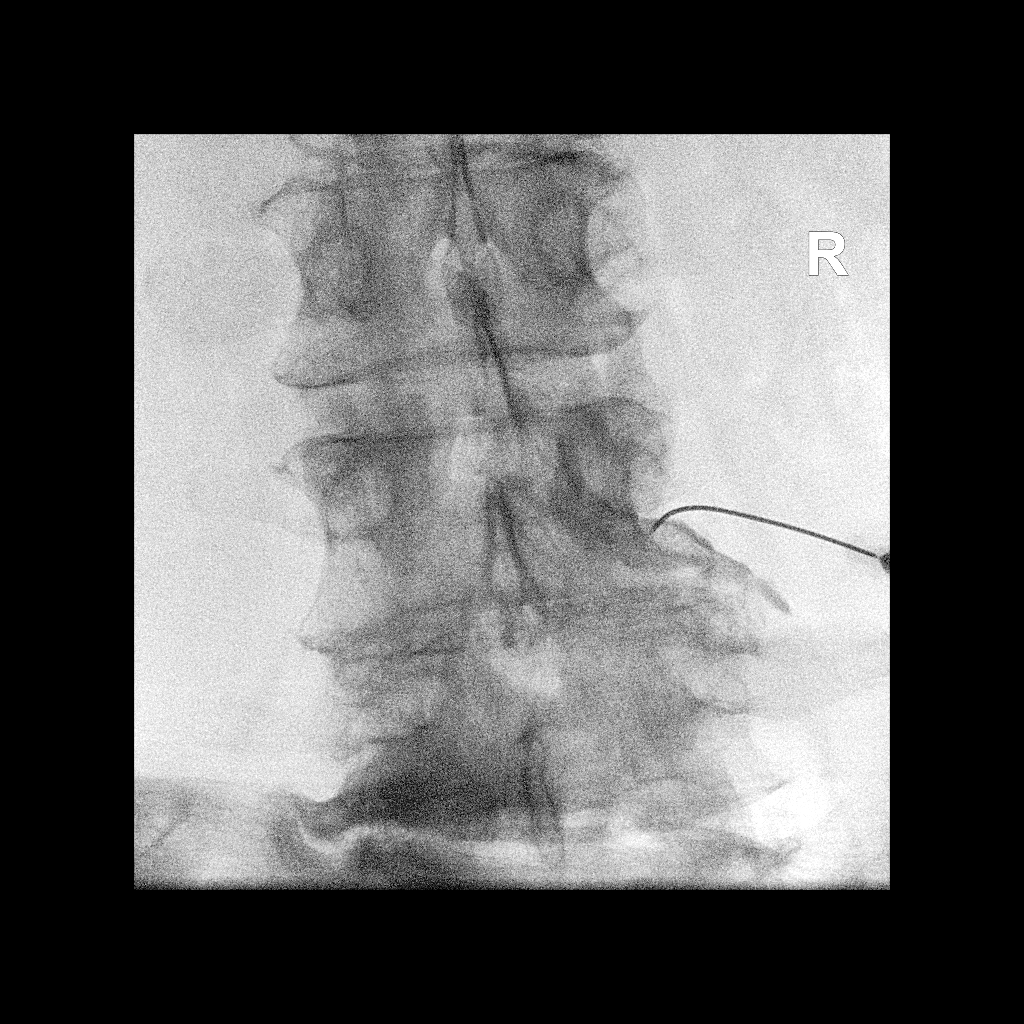

[2 of 2 positions shown; findings below may reference images not displayed]

EXAM:
EPIDURAL/NERVE ROOT

FLUOROSCOPY:
Radiation exposure index: 3.3 mGy reference air kerma

PROCEDURE:
The procedure, risks, benefits, and alternatives were explained to
the patient. Questions regarding the procedure were encouraged and
answered. The patient understands and consents to the procedure.

RIGHT L4 NERVE ROOT BLOCK AND TRANSFORAMINAL EPIDURAL: A posterior
oblique approach was taken to the intervertebral foramen on the
right at L4 using a curved 22 gauge spinal needle. Injection of
Omnipaque 180 outlined the L4 nerve root and showed good epidural
spread. No vascular opacification is seen. 80 mg of Depo-Medrol
mixed with 2 mL 1% lidocaine were instilled. The procedure was
well-tolerated, and the patient was discharged thirty minutes
following the injection in good condition.

COMPLICATIONS:
None
IMPRESSION: Technically successful injection consisting of a right L4 nerve root
block and transforaminal epidural.

## 2021-05-01 MED ORDER — IOPAMIDOL (ISOVUE-M 200) INJECTION 41%
1.0000 mL | Freq: Once | INTRAMUSCULAR | Status: AC
  Administered 2021-05-01: 1 mL via EPIDURAL

## 2021-05-01 MED ORDER — METHYLPREDNISOLONE ACETATE 40 MG/ML INJ SUSP (RADIOLOG
80.0000 mg | Freq: Once | INTRAMUSCULAR | Status: AC
  Administered 2021-05-01: 80 mg via EPIDURAL

## 2021-05-02 NOTE — Telephone Encounter (Signed)
See note

## 2021-05-02 NOTE — Telephone Encounter (Signed)
Ok to write order for wheelchair if needed. ? ?Emily Wolfe. Jerline Pain, MD ?05/02/2021 9:54 AM  ? ?

## 2021-05-06 ENCOUNTER — Other Ambulatory Visit: Payer: Self-pay

## 2021-05-06 DIAGNOSIS — M1990 Unspecified osteoarthritis, unspecified site: Secondary | ICD-10-CM

## 2021-05-06 DIAGNOSIS — M85852 Other specified disorders of bone density and structure, left thigh: Secondary | ICD-10-CM | POA: Diagnosis not present

## 2021-05-06 DIAGNOSIS — G35 Multiple sclerosis: Secondary | ICD-10-CM

## 2021-05-06 DIAGNOSIS — Z780 Asymptomatic menopausal state: Secondary | ICD-10-CM | POA: Diagnosis not present

## 2021-05-06 DIAGNOSIS — R42 Dizziness and giddiness: Secondary | ICD-10-CM

## 2021-05-06 LAB — HM DEXA SCAN

## 2021-05-07 ENCOUNTER — Telehealth: Payer: Self-pay | Admitting: Family Medicine

## 2021-05-07 NOTE — Telephone Encounter (Signed)
Rep from [facility] is requesting updated information for the wheelchair order. They are asking for a call back to [phone]. She also stated pt is requesting a call also. ?

## 2021-05-09 ENCOUNTER — Other Ambulatory Visit: Payer: Self-pay | Admitting: Neurological Surgery

## 2021-05-09 NOTE — Telephone Encounter (Signed)
Error

## 2021-05-13 ENCOUNTER — Encounter: Payer: Self-pay | Admitting: Family Medicine

## 2021-05-14 DIAGNOSIS — M4316 Spondylolisthesis, lumbar region: Secondary | ICD-10-CM | POA: Diagnosis not present

## 2021-05-15 ENCOUNTER — Encounter: Payer: Self-pay | Admitting: Family Medicine

## 2021-05-15 ENCOUNTER — Telehealth: Payer: Self-pay | Admitting: Family Medicine

## 2021-05-15 NOTE — Telephone Encounter (Signed)
Faxed Rx, Order, clinical notes and demographics to [facility], received confirmation  ?

## 2021-05-15 NOTE — Pre-Procedure Instructions (Signed)
Surgical Instructions ? ? ? Your procedure is scheduled on Monday, May 15th. ? Report to [facility] Main Entrance "A" at 5:30 A.M., then check in with the Admitting office. ? Call this number if you have problems the morning of surgery: ? [phone] ? ? If you have any questions prior to your surgery date call [phone]: Open Monday-Friday 8am-4pm ? ? ? Remember: ? Do not eat or drink after midnight the night before your surgery ?  ? Take these medicines the morning of surgery with A SIP OF WATER  ?DULoxetine (CYMBALTA)  ?gabapentin (NEURONTIN)  ? ?As needed: ?acetaminophen (TYLENOL)  ?Nasal spray ?loratadine (CLARITIN)  ?ondansetron (ZOFRAN-ODT)  ?Oxycodone HCl  ?oxyCODONE-acetaminophen (PERCOCET)  ? ?As of today, STOP taking any Aspirin (unless otherwise instructed by your surgeon) Aleve, Naproxen, Ibuprofen, Motrin, Advil, Goody's, BC's, all herbal medications, fish oil, and all vitamins. ?         ?           ?Do NOT Smoke (Tobacco/Vaping) for 24 hours prior to your procedure. ? ?If you use a CPAP at night, you may bring your mask/headgear for your overnight stay. ?  ?Contacts, glasses, piercing's, hearing aid's, dentures or partials may not be worn into surgery, please bring cases for these belongings.  ?  ?For patients admitted to the hospital, discharge time will be determined by your treatment team. ?  ?Patients discharged the day of surgery will not be allowed to drive home, and someone needs to stay with them for 24 hours. ? ?SURGICAL WAITING ROOM VISITATION ?Patients having surgery or a procedure may have two support people in the waiting room. These visitors may be switched out with other visitors if needed. ?Children under the age of 74 must have an adult accompany them who is not the patient. ?If the patient needs to stay at the hospital during part of their recovery, the visitor guidelines for inpatient rooms apply. ? ?Please refer to the [facility] website for the visitor guidelines for  Inpatients (after your surgery is over and you are in a regular room).  ? ? ?Special instructions:   ?[facility]- Preparing For Surgery ? ?Before surgery, you can play an important role. Because skin is not sterile, your skin needs to be as free of germs as possible. You can reduce the number of germs on your skin by washing with CHG (chlorahexidine gluconate) Soap before surgery.  CHG is an antiseptic cleaner which kills germs and bonds with the skin to continue killing germs even after washing.   ? ?Oral Hygiene is also important to reduce your risk of infection.  Remember - BRUSH YOUR TEETH THE MORNING OF SURGERY WITH YOUR REGULAR TOOTHPASTE ? ?Please do not use if you have an allergy to CHG or antibacterial soaps. If your skin becomes reddened/irritated stop using the CHG.  ?Do not shave (including legs and underarms) for at least 48 hours prior to first CHG shower. It is OK to shave your face. ? ?Please follow these instructions carefully. ?  ?Shower the NIGHT BEFORE SURGERY and the MORNING OF SURGERY ? ?If you chose to wash your hair, wash your hair first as usual with your normal shampoo. ? ?After you shampoo, rinse your hair and body thoroughly to remove the shampoo. ? ?Use CHG Soap as you would any other liquid soap. You can apply CHG directly to the skin and wash gently with a scrungie or a clean washcloth.  ? ?Apply the CHG Soap to your body  ONLY FROM THE NECK DOWN.  Do not use on open wounds or open sores. Avoid contact with your eyes, ears, mouth and genitals (private parts). Wash Face and genitals (private parts)  with your normal soap.  ? ?Wash thoroughly, paying special attention to the area where your surgery will be performed. ? ?Thoroughly rinse your body with warm water from the neck down. ? ?DO NOT shower/wash with your normal soap after using and rinsing off the CHG Soap. ? ?Pat yourself dry with a CLEAN TOWEL. ? ?Wear CLEAN PAJAMAS to bed the night before surgery ? ?Place CLEAN SHEETS on your  bed the night before your surgery ? ?DO NOT SLEEP WITH PETS. ? ? ?Day of Surgery: ?Take a shower with CHG soap. ?Do not wear jewelry or makeup ?Do not wear lotions, powders, perfumes, or deodorant. ?Do not shave 48 hours prior to surgery.  ?Do not bring valuables to the hospital.  ?[facility] is not responsible for any belongings or valuables. ?Do not wear nail polish, gel polish, artificial nails, or any other type of covering on natural nails (fingers and toes) ?If you have artificial nails or gel coating that need to be removed by a nail salon, please have this removed prior to surgery. Artificial nails or gel coating may interfere with anesthesia's ability to adequately monitor your vital signs. ?Wear Clean/Comfortable clothing the morning of surgery ?Remember to brush your teeth WITH YOUR REGULAR TOOTHPASTE. ?  ?Please read over the following fact sheets that you were given. ? ? ? ?If you received a COVID test during your pre-op visit  it is requested that you wear a mask when out in public, stay away from anyone that may not be feeling well and notify your surgeon if you develop symptoms. If you have been in contact with anyone that has tested positive in the last 10 days please notify you surgeon. ? ?

## 2021-05-15 NOTE — Telephone Encounter (Signed)
Called pt and verified DOB, advised faxed Rx, Order, Demographics, and clinical notes to [facility] this morning. Received confirmation it was received. Pt verbalized understanding ?

## 2021-05-15 NOTE — Telephone Encounter (Signed)
[facility] ?Fax number [phone] ? ?[employer] states the above needs Rx, clinicals, and demographics. Specifically needs a wheelchair. ? ?Insurance Company states surgery is on Monday (05/15), so needed ASAP. ?

## 2021-05-16 ENCOUNTER — Encounter (HOSPITAL_COMMUNITY): Payer: Self-pay

## 2021-05-16 ENCOUNTER — Other Ambulatory Visit: Payer: Self-pay

## 2021-05-16 ENCOUNTER — Encounter (HOSPITAL_COMMUNITY)
Admission: RE | Admit: 2021-05-16 | Discharge: 2021-05-16 | Disposition: A | Discharge status: Home / Self Care | Payer: Medicare Other | Source: Ambulatory Visit | Attending: Neurological Surgery | Admitting: Neurological Surgery

## 2021-05-16 VITALS — BP 160/82 | HR 84 | Temp 98.6°F | Resp 17 | Ht 64.0 in | Wt 143.5 lb

## 2021-05-16 DIAGNOSIS — G8929 Other chronic pain: Secondary | ICD-10-CM | POA: Diagnosis not present

## 2021-05-16 DIAGNOSIS — Z90722 Acquired absence of ovaries, bilateral: Secondary | ICD-10-CM | POA: Insufficient documentation

## 2021-05-16 DIAGNOSIS — G35 Multiple sclerosis: Secondary | ICD-10-CM | POA: Insufficient documentation

## 2021-05-16 DIAGNOSIS — Z01818 Encounter for other preprocedural examination: Secondary | ICD-10-CM | POA: Insufficient documentation

## 2021-05-16 DIAGNOSIS — M48061 Spinal stenosis, lumbar region without neurogenic claudication: Secondary | ICD-10-CM | POA: Insufficient documentation

## 2021-05-16 DIAGNOSIS — K219 Gastro-esophageal reflux disease without esophagitis: Secondary | ICD-10-CM | POA: Insufficient documentation

## 2021-05-16 DIAGNOSIS — K279 Peptic ulcer, site unspecified, unspecified as acute or chronic, without hemorrhage or perforation: Secondary | ICD-10-CM | POA: Insufficient documentation

## 2021-05-16 DIAGNOSIS — Z8543 Personal history of malignant neoplasm of ovary: Secondary | ICD-10-CM | POA: Diagnosis not present

## 2021-05-16 DIAGNOSIS — E785 Hyperlipidemia, unspecified: Secondary | ICD-10-CM | POA: Insufficient documentation

## 2021-05-16 DIAGNOSIS — I10 Essential (primary) hypertension: Secondary | ICD-10-CM | POA: Diagnosis not present

## 2021-05-16 DIAGNOSIS — R002 Palpitations: Secondary | ICD-10-CM | POA: Diagnosis not present

## 2021-05-16 DIAGNOSIS — Z79899 Other long term (current) drug therapy: Secondary | ICD-10-CM | POA: Insufficient documentation

## 2021-05-16 DIAGNOSIS — J4520 Mild intermittent asthma, uncomplicated: Secondary | ICD-10-CM | POA: Insufficient documentation

## 2021-05-16 DIAGNOSIS — M419 Scoliosis, unspecified: Secondary | ICD-10-CM | POA: Insufficient documentation

## 2021-05-16 DIAGNOSIS — M4310 Spondylolisthesis, site unspecified: Secondary | ICD-10-CM | POA: Diagnosis not present

## 2021-05-16 DIAGNOSIS — M549 Dorsalgia, unspecified: Secondary | ICD-10-CM | POA: Insufficient documentation

## 2021-05-16 HISTORY — DX: Umbilical hernia without obstruction or gangrene: K42.9

## 2021-05-16 HISTORY — DX: Gastro-esophageal reflux disease without esophagitis: K21.9

## 2021-05-16 HISTORY — DX: Myoneural disorder, unspecified: G70.9

## 2021-05-16 HISTORY — DX: Unilateral inguinal hernia, without obstruction or gangrene, not specified as recurrent: K40.90

## 2021-05-16 LAB — CBC
HCT: 47.3 % — ABNORMAL HIGH (ref 36.0–46.0)
Hemoglobin: 14.9 g/dL (ref 12.0–15.0)
MCH: 29.9 pg (ref 26.0–34.0)
MCHC: 31.5 g/dL (ref 30.0–36.0)
MCV: 95 fL (ref 80.0–100.0)
Platelets: 205 10*3/uL (ref 150–400)
RBC: 4.98 MIL/uL (ref 3.87–5.11)
RDW: 14.5 % (ref 11.5–15.5)
WBC: 7.5 10*3/uL (ref 4.0–10.5)
nRBC: 0 % (ref 0.0–0.2)

## 2021-05-16 LAB — BASIC METABOLIC PANEL
Anion gap: 9 (ref 5–15)
BUN: 22 mg/dL (ref 8–23)
CO2: 32 mmol/L (ref 22–32)
Calcium: 9.7 mg/dL (ref 8.9–10.3)
Chloride: 100 mmol/L (ref 98–111)
Creatinine, Ser: 0.89 mg/dL (ref 0.44–1.00)
GFR, Estimated: >60 mL/min (ref >60)
Glucose, Bld: 87 mg/dL (ref 70–99)
Potassium: 3.9 mmol/L (ref 3.5–5.1)
Sodium: 141 mmol/L (ref 135–145)

## 2021-05-16 LAB — TYPE AND SCREEN
ABO/RH(D): A NEG
Antibody Screen: NEGATIVE

## 2021-05-16 LAB — PROTIME-INR
INR: 0.9 (ref 0.8–1.2)
Prothrombin Time: 12.1 seconds (ref 11.4–15.2)

## 2021-05-16 LAB — SURGICAL PCR SCREEN
MRSA, PCR: NEGATIVE
Staphylococcus aureus: NEGATIVE

## 2021-05-16 NOTE — Progress Notes (Signed)
Anesthesia Chart Review: ? Case: [id number] Date/Time: 05/19/21 0715  ? Procedure: PLIF - L3-L4 (Back)  ? Anesthesia type: General  ? Pre-op diagnosis: Spondylolisthesis  ? Location: [facility] ROOM 20 / [facility]  ? Surgeons: Eustace Moore, MD  ? ?  ? ? ?DISCUSSION: Patient is a 66 year old female scheduled for the above procedure.  ? ?History includes never smoker, HTN, HLD, palpitations, asthma, angioedema, varicose veins (s/p surgery), multiple sclerosis (diagnosed 11/1991, last relapse 2018), scoliosis, ovarian cancer (s/p BSO 09/08/07, then THA, radical pelvic radical pelvic  ?peritonectomy, rectosigmoid resection with side-to-side stapled  ?colocolostomy, appendectomy, sigmoidoscopy, infracolic omentectomy, pelvic LN dissection 09/22/07, chemotherapy delayed due to postoperative infection; s/p LOA, small bowel resection, VHR recurrent SBO 05/11/17), PUD, GERD.  ? ?Recent evaluation by her neurologist (see below PROVIDERS).  ? ?Anesthesia team to evaluate on the day of surgery. ? ? ?VS: BP (!) 160/82   Pulse 84   Temp 37 ?C (Oral)   Resp 17   Ht '5\' 4"'$  (1.626 m)   Wt 65.1 kg   SpO2 98%   BMI 24.63 kg/m?  ? ? ?PROVIDERS: ?Vivi Barrack, MD is PCP  ?- Deneise Lever, MD is neurologist ([place]). Last visit 05/01/22.  He notes her back pain has been progressing and not responding to physical therapy or local blocks and had pending neurosurgery evaluation for significant lumbar stenosis.  He notes "Will trial Neurontin qhs as adjunctive therapy,but clearly needs surgery". ?- Salvatore Marvel, MD is allergies ?Lucio Edward, MD is GI ?- Previous cardiology evaluation in [facility]) ~ 4 years ago. There was not reported CAD.  ? ? ?LABS: Labs reviewed: Acceptable for surgery. ?(all labs ordered are listed, but only abnormal results are displayed) ? ?Labs Reviewed  ?CBC - Abnormal; Notable for the following components:  ?    Result Value  ? HCT 47.3 (*)   ? All other  components within normal limits  ?SURGICAL PCR SCREEN  ?PROTIME-INR  ?BASIC METABOLIC PANEL  ?TYPE AND SCREEN  ? ? ?Spirometry 01/03/18: FVC 2.4 (68%). FEV1 1.8 (65%). Mild airway obstruction Low vital capacity, perhaps due to restriction of lung volumes.  ? ? ?IMAGES: ?MRI L-spine 04/08/21: ?IMPRESSION: ?1. Severe spinal canal stenosis at L3-4 with severe right and ?moderate left neural foraminal narrowing. ?2. Moderate spinal canal stenosis at L4-5 with moderate bilateral ?neural foraminal narrowing. ?3. Narrowing of the bilateral subarticular zones at L5-S1 with ?moderate right and severe left neural foraminal narrowing. ?4. Severe right neural foraminal narrowing at L2-3. ? ? ?EKG: 05/16/21: Normal sinus rhythm with sinus arrhythmia ? ? ?CV: Reported prior stress and echo > 3 years ago.  ? ?Past Medical History:  ?Diagnosis Date  ? Anemia   ? Angio-edema   ? Back pain   ? Bilateral swelling of feet   ? Cancer [facility])   ? Chronic pain of both upper extremities   ? Eczema   ? Gallbladder problem   ? Genital warts 11/19/2017  ? GERD (gastroesophageal reflux disease)   ? Heartburn   ? 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 History of ovarian cancer 11/19/2017  ? History of stomach ulcers   ? Hyperlipidemia 06/29/2017  ? Hypertension 06/29/2017  ? Infection 10/2007  ? From hysterectomy sutures rupturing  ? Inguinal hernia   ? Joint pain   ? Lactose intolerance   ? Malignant neoplastic disease ([place]) 06/29/2017  ? Mild intermittent  asthma without complication 58/09/9831  ? Multiple sclerosis ([place]) 06/29/2017  ? Neuromuscular disorder ([place])   ? Osteoarthritis   ? Palpitations   ? Postmenopausal 06/29/2017  ? Scoliosis of lumbar spine 06/29/2017  ? Small bowel obstruction ([place]) 06/29/2017  ? Swallowing difficulty   ? Umbilical hernia   ? Urticaria   ? 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 Vitamin D deficiency 06/29/2017  ? ? ?Past Surgical History:  ?Procedure Laterality Date  ?  ADENOIDECTOMY    ? CHOLECYSTECTOMY  2009  ? HERNIA REPAIR    ? HERNIA REPAIR    ? 6/10, 9/10  ? HYSTERECTOMY ABDOMINAL WITH SALPINGO-OOPHORECTOMY    ? HYSTEROTOMY  2008  ? LAPAROSCOPIC LYSIS OF ADHESIONS    ? , With small bowel resection and ventral hernia repair with mesh 2019/[place]  ? MOHS SURGERY    ? SINOSCOPY    ? VARICOSE VEIN SURGERY    ? ? ?MEDICATIONS: ? acetaminophen (TYLENOL) 500 MG tablet  ? AMBULATORY NON FORMULARY MEDICATION  ? azelastine (ASTELIN) 0.1 % nasal spray  ? DULoxetine (CYMBALTA) 60 MG capsule  ? EPINEPHrine (AUVI-Q) 0.3 mg/0.3 mL IJ SOAJ injection  ? gabapentin (NEURONTIN) 300 MG capsule  ? leflunomide (ARAVA) 10 MG tablet  ? loratadine (CLARITIN) 10 MG tablet  ? meloxicam (MOBIC) 15 MG tablet  ? ondansetron (ZOFRAN-ODT) 8 MG disintegrating tablet  ? Oxycodone HCl 10 MG TABS  ? oxyCODONE-acetaminophen (PERCOCET) 10-325 MG tablet  ? senna-docusate (SENOKOT-S) 8.6-50 MG tablet  ? ?No current facility-administered medications for this encounter.  ? ? ?Myra Gianotti, PA-C ?Surgical Short Stay/Anesthesiology ?[facility] Phone [phone] ?[facility] Phone [phone] ?05/16/2021 3:40 PM ? ? ? ? ? ? ?

## 2021-05-16 NOTE — Progress Notes (Addendum)
PCP - Dimas Chyle MD ?Cardiologist - Hassan Rowan May-Depaola, DO. With [facility].  ?Saw one in [place] four years ago. "It seems that the issue resolved itself" Never saw one here. Lost 80 lbs.  ? ?PPM/ICD - Denies ? ?Chest x-ray - Not indicated ?EKG - 05/16/21 ?Stress Test - Yes I have had that before ?ECHO - Yes ?Cardiac Cath - Denies ? ?Sleep Study - Denies ? ?DM - Denies ? ?Anesthesia review: Yes cardiac history ? ?Patient denies shortness of breath, fever, cough and chest pain at PAT appointment ? ? ?All instructions explained to the patient, with a verbal understanding of the material. Patient agrees to go over the instructions while at home for a better understanding. The opportunity to ask questions was provided. ? ? ?

## 2021-05-16 NOTE — Telephone Encounter (Signed)
error 

## 2021-05-16 NOTE — Anesthesia Preprocedure Evaluation (Addendum)
Anesthesia Evaluation  ?Patient identified by MRN, date of birth, ID band ?Patient awake ? ? ? ?Reviewed: ?Allergy & Precautions, NPO status , Patient's Chart, lab work & pertinent test results ? ?History of Anesthesia Complications ?Negative for: history of anesthetic complications ? ?Airway ?Mallampati: I ? ?TM Distance: >3 FB ?Neck ROM: Full ? ? ? Dental ? ?(+) Dental Advisory Given, Implants ?  ?Pulmonary ?asthma ,  ?  ?Pulmonary exam normal ? ? ? ? ? ? ? Cardiovascular ?hypertension (no longer on meds), Normal cardiovascular exam ? ? ?  ?Neuro/Psych ?PSYCHIATRIC DISORDERS Anxiety  Neuromuscular disease (MS)   ? GI/Hepatic ?Neg liver ROS, GERD  Controlled,  ?Endo/Other  ?negative endocrine ROS ? Renal/GU ?negative Renal ROS  ? ?  ?Musculoskeletal ? ?(+) Arthritis ,  ?Scoliosis ?  ? Abdominal ?  ?Peds ? Hematology ?negative hematology ROS ?(+)   ?Anesthesia Other Findings ? ? Reproductive/Obstetrics ? ?Ovarian cancer ? ? ?  ? ? ? ? ? ? ? ? ? ? ? ? ? ?  ?  ? ? ? ? ? ? ?Anesthesia Physical ?Anesthesia Plan ? ?ASA: 3 ? ?Anesthesia Plan: General  ? ?Post-op Pain Management: Tylenol PO (pre-op)* and Celebrex PO (pre-op)*  ? ?Induction: Intravenous ? ?PONV Risk Score and Plan: 3 and Treatment may vary due to age or medical condition, Ondansetron, Dexamethasone and Midazolam ? ?Airway Management Planned: Oral ETT ? ?Additional Equipment: None ? ?Intra-op Plan:  ? ?Post-operative Plan: Extubation in OR ? ?Informed Consent: I have reviewed the patients History and Physical, chart, labs and discussed the procedure including the risks, benefits and alternatives for the proposed anesthesia with the patient or authorized representative who has indicated his/her understanding and acceptance.  ? ? ? ?Dental advisory given ? ?Plan Discussed with: CRNA and Anesthesiologist ? ?Anesthesia Plan Comments:   ? ? ? ? ?Anesthesia Quick Evaluation ? ?

## 2021-05-19 ENCOUNTER — Observation Stay (HOSPITAL_COMMUNITY)
Admission: RE | Admit: 2021-05-19 | Discharge: 2021-05-21 | Disposition: A | Discharge status: Home / Self Care | Payer: Medicare Other | Attending: Neurological Surgery | Admitting: Neurological Surgery

## 2021-05-19 ENCOUNTER — Ambulatory Visit (HOSPITAL_COMMUNITY): Payer: Medicare Other

## 2021-05-19 ENCOUNTER — Encounter (HOSPITAL_COMMUNITY): Payer: Self-pay | Admitting: Neurological Surgery

## 2021-05-19 ENCOUNTER — Other Ambulatory Visit: Payer: Self-pay

## 2021-05-19 ENCOUNTER — Ambulatory Visit (HOSPITAL_COMMUNITY)
Admission: RE | Disposition: A | Discharge status: Home / Self Care | Payer: Self-pay | Source: Home / Self Care | Attending: Neurological Surgery

## 2021-05-19 ENCOUNTER — Ambulatory Visit (HOSPITAL_COMMUNITY): Payer: Medicare Other | Admitting: Vascular Surgery

## 2021-05-19 ENCOUNTER — Ambulatory Visit (HOSPITAL_BASED_OUTPATIENT_CLINIC_OR_DEPARTMENT_OTHER): Payer: Medicare Other | Admitting: Certified Registered"

## 2021-05-19 DIAGNOSIS — I10 Essential (primary) hypertension: Secondary | ICD-10-CM | POA: Insufficient documentation

## 2021-05-19 DIAGNOSIS — Z981 Arthrodesis status: Secondary | ICD-10-CM | POA: Diagnosis not present

## 2021-05-19 DIAGNOSIS — J45909 Unspecified asthma, uncomplicated: Secondary | ICD-10-CM | POA: Diagnosis not present

## 2021-05-19 DIAGNOSIS — Z8543 Personal history of malignant neoplasm of ovary: Secondary | ICD-10-CM | POA: Insufficient documentation

## 2021-05-19 DIAGNOSIS — M48061 Spinal stenosis, lumbar region without neurogenic claudication: Secondary | ICD-10-CM | POA: Diagnosis not present

## 2021-05-19 DIAGNOSIS — M4316 Spondylolisthesis, lumbar region: Secondary | ICD-10-CM

## 2021-05-19 DIAGNOSIS — M4326 Fusion of spine, lumbar region: Secondary | ICD-10-CM | POA: Diagnosis not present

## 2021-05-19 DIAGNOSIS — M5416 Radiculopathy, lumbar region: Secondary | ICD-10-CM | POA: Diagnosis not present

## 2021-05-19 DIAGNOSIS — M5136 Other intervertebral disc degeneration, lumbar region: Secondary | ICD-10-CM | POA: Diagnosis not present

## 2021-05-19 DIAGNOSIS — S32040A Wedge compression fracture of fourth lumbar vertebra, initial encounter for closed fracture: Secondary | ICD-10-CM | POA: Diagnosis not present

## 2021-05-19 LAB — ABO/RH: ABO/RH(D): A NEG

## 2021-05-19 IMAGING — RF DG LUMBAR SPINE 2-3V
1 series · 2 of 2 positions shown · non-contrast
Comparison: [DATE] and MR lumbar spine [DATE].

CLINICAL DATA: L-spine fusion.

EXAM:
LUMBAR SPINE - 2-3 VIEW

[Series 1: dg x-ray · 0.14mm/px · 2 of 2 slices shown]
[im 1/2]
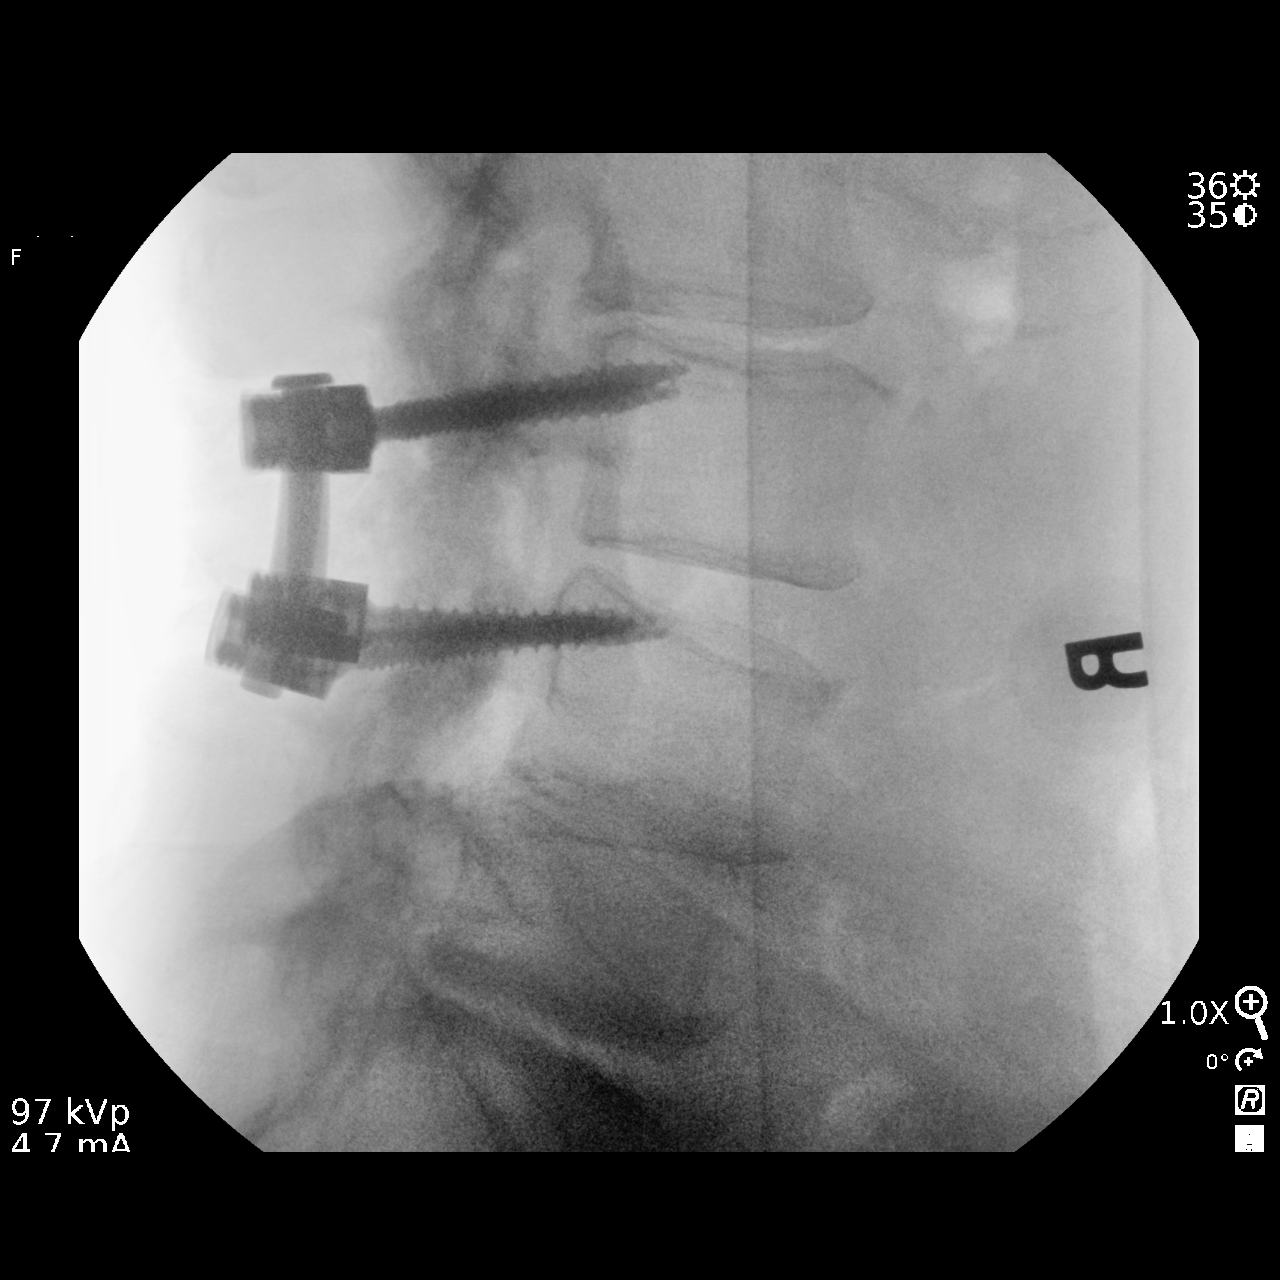
[im 2/2]
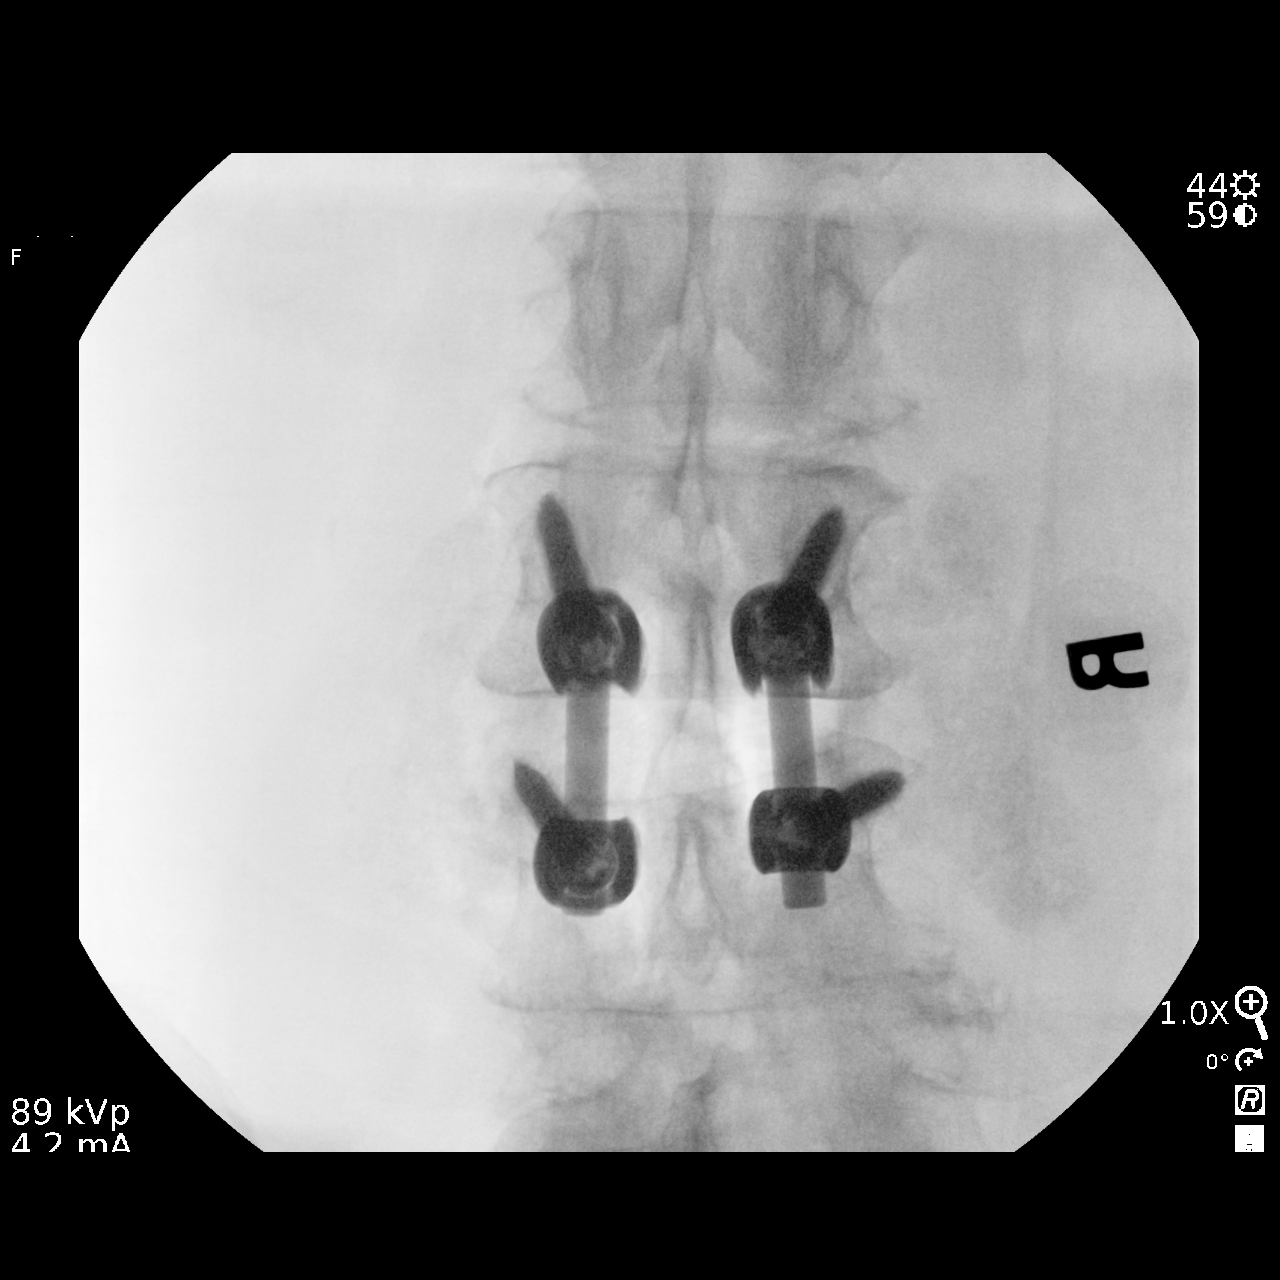

[2 of 2 positions shown; findings below may reference images not displayed]

FINDINGS: 2 intraoperative fluoroscopic spot views of the lumbar spine are
provided in the AP and lateral projections. Numbering system
utilized on [DATE] is preserved. L3-4 posterior lumbar interbody
fusion. Degenerative disc disease. Osseous detail is degraded by
technique.
IMPRESSION: Intraoperative visualization for L3-4 posterior lumbar interbody
fusion.

## 2021-05-19 SURGERY — POSTERIOR LUMBAR FUSION 1 LEVEL
Anesthesia: General | Site: Back

## 2021-05-19 MED ORDER — GABAPENTIN 300 MG PO CAPS: 300.0000 mg | ORAL_CAPSULE | ORAL | Status: AC

## 2021-05-19 MED ORDER — MIDAZOLAM HCL 2 MG/2ML IJ SOLN
INTRAMUSCULAR | Status: DC | PRN
  Administered 2021-05-19: 2 mg via INTRAVENOUS

## 2021-05-19 MED ORDER — ONDANSETRON HCL 4 MG PO TABS
4.0000 mg | ORAL_TABLET | Freq: Four times a day (QID) | ORAL | Status: DC | PRN
  Administered 2021-05-20: 4 mg via ORAL
  Filled 2021-05-19: qty 1

## 2021-05-19 MED ORDER — SUFENTANIL CITRATE 50 MCG/ML IV SOLN
INTRAVENOUS | Status: AC
  Filled 2021-05-19: qty 1

## 2021-05-19 MED ORDER — ACETAMINOPHEN 500 MG PO TABS
ORAL_TABLET | ORAL | Status: AC
  Administered 2021-05-19: 1000 mg via ORAL
  Filled 2021-05-19: qty 2

## 2021-05-19 MED ORDER — BUPIVACAINE HCL (PF) 0.25 % IJ SOLN
INTRAMUSCULAR | Status: DC | PRN
  Administered 2021-05-19: 5 mL

## 2021-05-19 MED ORDER — LEFLUNOMIDE 20 MG PO TABS
10.0000 mg | ORAL_TABLET | Freq: Every evening | ORAL | Status: DC
  Filled 2021-05-19 (×3): qty 0.5

## 2021-05-19 MED ORDER — GLYCOPYRROLATE 0.2 MG/ML IJ SOLN
INTRAMUSCULAR | Status: DC | PRN
  Administered 2021-05-19: .2 mg via INTRAVENOUS

## 2021-05-19 MED ORDER — CHLORHEXIDINE GLUCONATE CLOTH 2 % EX PADS
6.0000 | MEDICATED_PAD | Freq: Once | CUTANEOUS | Status: DC
Start: 2021-05-19 — End: 2021-05-19

## 2021-05-19 MED ORDER — DEXAMETHASONE SODIUM PHOSPHATE 4 MG/ML IJ SOLN: 4.0000 mg | Freq: Four times a day (QID) | INTRAMUSCULAR | Status: DC

## 2021-05-19 MED ORDER — ONDANSETRON HCL 4 MG/2ML IJ SOLN
INTRAMUSCULAR | Status: DC | PRN
  Administered 2021-05-19: 4 mg via INTRAVENOUS

## 2021-05-19 MED ORDER — THROMBIN (RECOMBINANT) 5000 UNITS EX SOLR
CUTANEOUS | Status: AC
  Filled 2021-05-19: qty 5000

## 2021-05-19 MED ORDER — CEFAZOLIN SODIUM-DEXTROSE 2-4 GM/100ML-% IV SOLN
2.0000 g | Freq: Three times a day (TID) | INTRAVENOUS | Status: AC
  Administered 2021-05-19 (×2): 2 g via INTRAVENOUS
  Filled 2021-05-19 (×2): qty 100

## 2021-05-19 MED ORDER — PROPOFOL 10 MG/ML IV BOLUS
INTRAVENOUS | Status: DC | PRN
  Administered 2021-05-19: 140 mg via INTRAVENOUS

## 2021-05-19 MED ORDER — CELECOXIB 200 MG PO CAPS
ORAL_CAPSULE | ORAL | Status: AC
  Administered 2021-05-19: 200 mg via ORAL
  Filled 2021-05-19: qty 1

## 2021-05-19 MED ORDER — PHENYLEPHRINE HCL (PRESSORS) 10 MG/ML IV SOLN
INTRAVENOUS | Status: DC | PRN
Start: 2021-05-19 — End: 2021-05-19
  Administered 2021-05-19 (×2): 80 ug via INTRAVENOUS
  Administered 2021-05-19: 160 ug via INTRAVENOUS

## 2021-05-19 MED ORDER — DEXAMETHASONE SODIUM PHOSPHATE 10 MG/ML IJ SOLN
INTRAMUSCULAR | Status: DC | PRN
  Administered 2021-05-19: 10 mg via INTRAVENOUS

## 2021-05-19 MED ORDER — SODIUM CHLORIDE 0.9 % IV SOLN
250.0000 mL | INTRAVENOUS | Status: DC
  Administered 2021-05-19: 250 mL via INTRAVENOUS

## 2021-05-19 MED ORDER — PHENYLEPHRINE HCL-NACL 20-0.9 MG/250ML-% IV SOLN
INTRAVENOUS | Status: DC | PRN
Start: 2021-05-19 — End: 2021-05-19
  Administered 2021-05-19: 50 ug/min via INTRAVENOUS

## 2021-05-19 MED ORDER — CEFAZOLIN SODIUM-DEXTROSE 2-4 GM/100ML-% IV SOLN
INTRAVENOUS | Status: AC
  Filled 2021-05-19: qty 100

## 2021-05-19 MED ORDER — LIDOCAINE HCL (CARDIAC) PF 100 MG/5ML IV SOSY
PREFILLED_SYRINGE | INTRAVENOUS | Status: DC | PRN
  Administered 2021-05-19: 50 mg via INTRATRACHEAL

## 2021-05-19 MED ORDER — OXYCODONE HCL 5 MG/5ML PO SOLN: 5.0000 mg | Freq: Once | ORAL | Status: DC | PRN

## 2021-05-19 MED ORDER — SUGAMMADEX SODIUM 200 MG/2ML IV SOLN
INTRAVENOUS | Status: DC | PRN
  Administered 2021-05-19: 200 mg via INTRAVENOUS

## 2021-05-19 MED ORDER — CEFAZOLIN SODIUM-DEXTROSE 2-4 GM/100ML-% IV SOLN
2.0000 g | INTRAVENOUS | Status: AC
  Administered 2021-05-19: 2 g via INTRAVENOUS

## 2021-05-19 MED ORDER — CHLORHEXIDINE GLUCONATE 0.12 % MT SOLN: 15.0000 mL | Freq: Once | OROMUCOSAL | Status: AC

## 2021-05-19 MED ORDER — 0.9 % SODIUM CHLORIDE (POUR BTL) OPTIME
TOPICAL | Status: DC | PRN
  Administered 2021-05-19: 1000 mL

## 2021-05-19 MED ORDER — ONDANSETRON HCL 4 MG/2ML IJ SOLN: 4.0000 mg | Freq: Four times a day (QID) | INTRAMUSCULAR | Status: DC | PRN

## 2021-05-19 MED ORDER — SODIUM CHLORIDE 0.9% FLUSH
3.0000 mL | Freq: Two times a day (BID) | INTRAVENOUS | Status: DC
  Administered 2021-05-19 (×2): 3 mL via INTRAVENOUS

## 2021-05-19 MED ORDER — POTASSIUM CHLORIDE IN NACL 20-0.9 MEQ/L-% IV SOLN: INTRAVENOUS | Status: DC

## 2021-05-19 MED ORDER — CELECOXIB 200 MG PO CAPS: 200.0000 mg | ORAL_CAPSULE | Freq: Once | ORAL | Status: AC

## 2021-05-19 MED ORDER — ACETAMINOPHEN 500 MG PO TABS: 1000.0000 mg | ORAL_TABLET | Freq: Once | ORAL | Status: AC

## 2021-05-19 MED ORDER — THROMBIN 20000 UNITS EX SOLR: CUTANEOUS | Status: DC | PRN

## 2021-05-19 MED ORDER — ACETAMINOPHEN 500 MG PO TABS
1000.0000 mg | ORAL_TABLET | Freq: Four times a day (QID) | ORAL | Status: AC
  Administered 2021-05-19 – 2021-05-20 (×4): 1000 mg via ORAL
  Filled 2021-05-19 (×4): qty 2

## 2021-05-19 MED ORDER — FENTANYL CITRATE (PF) 100 MCG/2ML IJ SOLN
25.0000 ug | INTRAMUSCULAR | Status: DC | PRN
  Administered 2021-05-19 (×2): 50 ug via INTRAVENOUS
  Administered 2021-05-19: 25 ug via INTRAVENOUS

## 2021-05-19 MED ORDER — THROMBIN 5000 UNITS EX SOLR: OROMUCOSAL | Status: DC | PRN

## 2021-05-19 MED ORDER — ONDANSETRON HCL 4 MG/2ML IJ SOLN: 4.0000 mg | Freq: Once | INTRAMUSCULAR | Status: DC | PRN

## 2021-05-19 MED ORDER — THROMBIN (RECOMBINANT) 20000 UNITS EX SOLR
CUTANEOUS | Status: AC
  Filled 2021-05-19: qty 20000

## 2021-05-19 MED ORDER — SENNA 8.6 MG PO TABS
1.0000 | ORAL_TABLET | Freq: Two times a day (BID) | ORAL | Status: DC
  Administered 2021-05-19 – 2021-05-21 (×5): 8.6 mg via ORAL
  Filled 2021-05-19 (×5): qty 1

## 2021-05-19 MED ORDER — GABAPENTIN 300 MG PO CAPS
300.0000 mg | ORAL_CAPSULE | Freq: Three times a day (TID) | ORAL | Status: DC
  Administered 2021-05-19 – 2021-05-21 (×7): 300 mg via ORAL
  Filled 2021-05-19 (×7): qty 1

## 2021-05-19 MED ORDER — PROPOFOL 10 MG/ML IV BOLUS
INTRAVENOUS | Status: AC
  Filled 2021-05-19: qty 20

## 2021-05-19 MED ORDER — DEXAMETHASONE 4 MG PO TABS
4.0000 mg | ORAL_TABLET | Freq: Four times a day (QID) | ORAL | Status: DC
  Administered 2021-05-19 – 2021-05-21 (×9): 4 mg via ORAL
  Filled 2021-05-19 (×9): qty 1

## 2021-05-19 MED ORDER — BUPIVACAINE HCL (PF) 0.25 % IJ SOLN
INTRAMUSCULAR | Status: AC
  Filled 2021-05-19: qty 30

## 2021-05-19 MED ORDER — METHOCARBAMOL 1000 MG/10ML IJ SOLN: 500.0000 mg | Freq: Four times a day (QID) | INTRAVENOUS | Status: DC | PRN

## 2021-05-19 MED ORDER — FENTANYL CITRATE (PF) 100 MCG/2ML IJ SOLN
INTRAMUSCULAR | Status: AC
  Filled 2021-05-19: qty 2

## 2021-05-19 MED ORDER — SUFENTANIL CITRATE 50 MCG/ML IV SOLN
INTRAVENOUS | Status: DC | PRN
  Administered 2021-05-19 (×3): 10 ug via INTRAVENOUS

## 2021-05-19 MED ORDER — PHENOL 1.4 % MT LIQD: 1.0000 | OROMUCOSAL | Status: DC | PRN

## 2021-05-19 MED ORDER — CELECOXIB 200 MG PO CAPS
200.0000 mg | ORAL_CAPSULE | Freq: Two times a day (BID) | ORAL | Status: DC
  Administered 2021-05-19 – 2021-05-21 (×4): 200 mg via ORAL
  Filled 2021-05-19 (×4): qty 1

## 2021-05-19 MED ORDER — ROCURONIUM 10MG/ML (10ML) SYRINGE FOR MEDFUSION PUMP - OPTIME
INTRAVENOUS | Status: DC | PRN
  Administered 2021-05-19: 100 mg via INTRAVENOUS

## 2021-05-19 MED ORDER — DULOXETINE HCL 30 MG PO CPEP
60.0000 mg | ORAL_CAPSULE | Freq: Every morning | ORAL | Status: DC
  Administered 2021-05-21: 60 mg via ORAL
  Filled 2021-05-19: qty 2

## 2021-05-19 MED ORDER — ORAL CARE MOUTH RINSE: 15.0000 mL | Freq: Once | OROMUCOSAL | Status: AC

## 2021-05-19 MED ORDER — MIDAZOLAM HCL 2 MG/2ML IJ SOLN
INTRAMUSCULAR | Status: AC
  Filled 2021-05-19: qty 2

## 2021-05-19 MED ORDER — LACTATED RINGERS IV SOLN: INTRAVENOUS | Status: DC | PRN

## 2021-05-19 MED ORDER — CHLORHEXIDINE GLUCONATE 0.12 % MT SOLN
OROMUCOSAL | Status: AC
  Administered 2021-05-19: 15 mL via OROMUCOSAL
  Filled 2021-05-19: qty 15

## 2021-05-19 MED ORDER — MORPHINE SULFATE (PF) 2 MG/ML IV SOLN: 2.0000 mg | INTRAVENOUS | Status: DC | PRN

## 2021-05-19 MED ORDER — OXYCODONE HCL 5 MG PO TABS: 5.0000 mg | ORAL_TABLET | Freq: Once | ORAL | Status: DC | PRN

## 2021-05-19 MED ORDER — LACTATED RINGERS IV SOLN: INTRAVENOUS | Status: DC

## 2021-05-19 MED ORDER — CHLORHEXIDINE GLUCONATE CLOTH 2 % EX PADS: 6.0000 | MEDICATED_PAD | Freq: Once | CUTANEOUS | Status: DC

## 2021-05-19 MED ORDER — SODIUM CHLORIDE 0.9% FLUSH: 3.0000 mL | INTRAVENOUS | Status: DC | PRN

## 2021-05-19 MED ORDER — OXYCODONE HCL 5 MG PO TABS
10.0000 mg | ORAL_TABLET | ORAL | Status: DC | PRN
  Administered 2021-05-19 – 2021-05-21 (×9): 10 mg via ORAL
  Filled 2021-05-19 (×9): qty 2

## 2021-05-19 MED ORDER — MENTHOL 3 MG MT LOZG: 1.0000 | LOZENGE | OROMUCOSAL | Status: DC | PRN

## 2021-05-19 MED ORDER — METHOCARBAMOL 500 MG PO TABS
500.0000 mg | ORAL_TABLET | Freq: Four times a day (QID) | ORAL | Status: DC | PRN
  Administered 2021-05-19 – 2021-05-21 (×3): 500 mg via ORAL
  Filled 2021-05-19 (×4): qty 1

## 2021-05-19 MED ORDER — GABAPENTIN 300 MG PO CAPS
ORAL_CAPSULE | ORAL | Status: AC
  Administered 2021-05-19: 300 mg via ORAL
  Filled 2021-05-19: qty 1

## 2021-05-19 MED ORDER — AZELASTINE HCL 0.1 % NA SOLN: 1.0000 | Freq: Every day | NASAL | Status: DC | PRN

## 2021-05-19 SURGICAL SUPPLY — 63 items
BAG COUNTER SPONGE SURGICOUNT (BAG) ×2 IMPLANT
BASKET BONE COLLECTION (BASKET) ×2 IMPLANT
BENZOIN TINCTURE PRP APPL 2/3 (GAUZE/BANDAGES/DRESSINGS) ×2 IMPLANT
BLADE BONE MILL MEDIUM (MISCELLANEOUS) ×1 IMPLANT
BLADE CLIPPER SURG (BLADE) IMPLANT
BONE CANC CHIPS 20CC PCAN1/4 (Bone Implant) ×2 IMPLANT
BUR CARBIDE MATCH 3.0 (BURR) ×2 IMPLANT
CANISTER SUCT 3000ML PPV (MISCELLANEOUS) ×2 IMPLANT
CHIPS CANC BONE 20CC PCAN1/4 (Bone Implant) ×1 IMPLANT
CNTNR URN SCR LID CUP LEK RST (MISCELLANEOUS) ×1 IMPLANT
CONT SPEC 4OZ STRL OR WHT (MISCELLANEOUS) ×1
COVER BACK TABLE 60X90IN (DRAPES) ×2 IMPLANT
DERMABOND ADVANCED (GAUZE/BANDAGES/DRESSINGS) ×1
DERMABOND ADVANCED .7 DNX12 (GAUZE/BANDAGES/DRESSINGS) ×1 IMPLANT
DRAPE C-ARM 42X72 X-RAY (DRAPES) ×4 IMPLANT
DRAPE LAPAROTOMY 100X72X124 (DRAPES) ×2 IMPLANT
DRAPE SURG 17X23 STRL (DRAPES) ×2 IMPLANT
DRSG OPSITE POSTOP 4X6 (GAUZE/BANDAGES/DRESSINGS) ×1 IMPLANT
DURAPREP 26ML APPLICATOR (WOUND CARE) ×2 IMPLANT
ELECT REM PT RETURN 9FT ADLT (ELECTROSURGICAL) ×2
ELECTRODE REM PT RTRN 9FT ADLT (ELECTROSURGICAL) ×1 IMPLANT
EVACUATOR 1/8 PVC DRAIN (DRAIN) ×1 IMPLANT
GAUZE 4X4 16PLY ~~LOC~~+RFID DBL (SPONGE) IMPLANT
GLOVE BIO SURGEON STRL SZ7 (GLOVE) ×1 IMPLANT
GLOVE BIO SURGEON STRL SZ8 (GLOVE) ×4 IMPLANT
GLOVE BIOGEL PI IND STRL 7.0 (GLOVE) IMPLANT
GLOVE BIOGEL PI INDICATOR 7.0 (GLOVE) ×1
GOWN STRL REUS W/ TWL LRG LVL3 (GOWN DISPOSABLE) IMPLANT
GOWN STRL REUS W/ TWL XL LVL3 (GOWN DISPOSABLE) ×2 IMPLANT
GOWN STRL REUS W/TWL 2XL LVL3 (GOWN DISPOSABLE) IMPLANT
GOWN STRL REUS W/TWL LRG LVL3 (GOWN DISPOSABLE)
GOWN STRL REUS W/TWL XL LVL3 (GOWN DISPOSABLE) ×3
GRAFT BNE CANC CHIPS 1-8 20CC (Bone Implant) IMPLANT
GRAFT BONE PROTEIOS LRG 5CC (Orthopedic Implant) ×2 IMPLANT
HEMOSTAT POWDER KIT SURGIFOAM (HEMOSTASIS) ×2 IMPLANT
KIT BASIN OR (CUSTOM PROCEDURE TRAY) ×2 IMPLANT
KIT GRAFTMAG DEL NEURO DISP (NEUROSURGERY SUPPLIES) IMPLANT
KIT TURNOVER KIT B (KITS) ×2 IMPLANT
MATRIX SPINE STRIP NEOCORE 5CC (Putty) IMPLANT
MILL BONE PREP (MISCELLANEOUS) ×2 IMPLANT
NDL HYPO 25X1 1.5 SAFETY (NEEDLE) ×1 IMPLANT
NEEDLE HYPO 25X1 1.5 SAFETY (NEEDLE) ×2 IMPLANT
NS IRRIG 1000ML POUR BTL (IV SOLUTION) ×2 IMPLANT
PACK LAMINECTOMY NEURO (CUSTOM PROCEDURE TRAY) ×2 IMPLANT
PAD ARMBOARD 7.5X6 YLW CONV (MISCELLANEOUS) ×7 IMPLANT
ROD LORD LIPPED TI 5.5X40 (Rod) ×2 IMPLANT
SCREW ILIAC PA 5.5X40 (Screw) ×1 IMPLANT
SCREW POLYAXIAL TULIP (Screw) ×3 IMPLANT
SCREW SHANK MOD 5.5X40 (Screw) ×3 IMPLANT
SET SCREW (Screw) ×4 IMPLANT
SET SCREW SPNE (Screw) IMPLANT
SPONGE SURGIFOAM ABS GEL 100 (HEMOSTASIS) ×1 IMPLANT
SPONGE T-LAP 4X18 ~~LOC~~+RFID (SPONGE) IMPLANT
STRIP CLOSURE SKIN 1/2X4 (GAUZE/BANDAGES/DRESSINGS) ×3 IMPLANT
STRIP MATRIX NEOCORE 5CC (Putty) ×2 IMPLANT
SUT VIC AB 0 CT1 18XCR BRD8 (SUTURE) ×1 IMPLANT
SUT VIC AB 0 CT1 8-18 (SUTURE) ×1
SUT VIC AB 2-0 CP2 18 (SUTURE) ×2 IMPLANT
SUT VIC AB 3-0 SH 8-18 (SUTURE) ×4 IMPLANT
TOWEL GREEN STERILE (TOWEL DISPOSABLE) ×2 IMPLANT
TOWEL GREEN STERILE FF (TOWEL DISPOSABLE) ×2 IMPLANT
TRAY FOLEY MTR SLVR 16FR STAT (SET/KITS/TRAYS/PACK) ×2 IMPLANT
WATER STERILE IRR 1000ML POUR (IV SOLUTION) ×2 IMPLANT

## 2021-05-19 NOTE — H&P (Signed)
Subjective: ?Patient is a 66 y.o. female admitted for radiculopathy. Onset of symptoms was several months ago, gradually worsening since that time.  The pain is rated severe, and is located at the across the lower back and radiates to RLE. The pain is described as aching and occurs all day. The symptoms have been progressive. Symptoms are exacerbated by exercise, standing, and walking for more than a few minutes. 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 ?Past Medical History:  ?Diagnosis Date  ? Anemia   ? Angio-edema   ? Back pain   ? Bilateral swelling of feet   ? Cancer [facility])   ? Chronic pain of both upper extremities   ? Eczema   ? Gallbladder problem   ? Genital warts 11/19/2017  ? GERD (gastroesophageal reflux disease)   ? Heartburn   ? 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 History of ovarian cancer 11/19/2017  ? History of stomach ulcers   ? Hyperlipidemia 06/29/2017  ? Hypertension 06/29/2017  ? Infection 10/2007  ? From hysterectomy sutures rupturing  ? Inguinal hernia   ? Joint pain   ? Lactose intolerance   ? Malignant neoplastic disease ([place]) 06/29/2017  ? Mild intermittent asthma without complication 16/10/9602  ? Multiple sclerosis ([place]) 06/29/2017  ? Neuromuscular disorder ([place])   ? Osteoarthritis   ? Palpitations   ? Postmenopausal 06/29/2017  ? Scoliosis of lumbar spine 06/29/2017  ? Small bowel obstruction ([place]) 06/29/2017  ? Swallowing difficulty   ? Umbilical hernia   ? Urticaria   ? 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 Vitamin D deficiency 06/29/2017  ?  ?Past Surgical History:  ?Procedure Laterality Date  ? ADENOIDECTOMY    ? CHOLECYSTECTOMY  2009  ? HERNIA REPAIR    ? HERNIA REPAIR    ? 6/10, 9/10  ? HYSTERECTOMY ABDOMINAL WITH SALPINGO-OOPHORECTOMY    ? HYSTEROTOMY  2008  ? LAPAROSCOPIC LYSIS OF ADHESIONS    ? , With small bowel resection and ventral hernia repair with mesh 2019/[place]  ? MOHS SURGERY    ? SINOSCOPY    ?  VARICOSE VEIN SURGERY    ?  ?Prior to Admission medications   ?Medication Sig Start Date End Date Taking? Authorizing Provider  ?acetaminophen (TYLENOL) 500 MG tablet Take 500 mg by mouth every 6 (six) hours as needed (for pain.).   Yes [provider]  ?DULoxetine (CYMBALTA) 60 MG capsule Take 60 mg by mouth in the morning.   Yes [provider]  ?gabapentin (NEURONTIN) 300 MG capsule Take 1 capsule (300 mg total) by mouth 3 (three) times daily as needed. 04/10/21  Yes Gregor Hams, MD  ?leflunomide (ARAVA) 10 MG tablet Take 10 mg by mouth every evening.   Yes [provider]  ?loratadine (CLARITIN) 10 MG tablet Take 10 mg by mouth daily as needed for allergies.   Yes [provider]  ?meloxicam (MOBIC) 15 MG tablet Take 1 tablet (15 mg total) by mouth daily. 03/27/21  Yes Gregor Hams, MD  ?ondansetron (ZOFRAN-ODT) 8 MG disintegrating tablet Take 8 mg by mouth every 8 (eight) hours as needed. 05/08/21  Yes [provider]  ?Oxycodone HCl 10 MG TABS Take 10 mg by mouth every 6 (six) hours as needed for pain. 05/09/21  Yes [provider]  ?oxyCODONE-acetaminophen (PERCOCET) 10-325 MG tablet Take 1 tablet by mouth every 8 (eight) hours as needed for pain. 04/25/21  Yes Georgina Snell,  Rebekah Chesterfield, MD  ?senna-docusate (SENOKOT-S) 8.6-50 MG tablet Take 1 tablet by mouth at bedtime as needed for mild constipation or moderate constipation. ?Patient taking differently: Take 1 tablet by mouth at bedtime. 03/30/21  Yes Long, Wonda Olds, MD  ?AMBULATORY NON FORMULARY MEDICATION Walker.  ?Use as needed.  ?Disp 1 ?M25.551 04/02/21   Gregor Hams, MD  ?azelastine (ASTELIN) 0.1 % nasal spray PLACE 2 SPRAYS INTO BOTH NOSTRILS 2 TIMES DAILY. ?Patient taking differently: Place 1 spray into both nostrils daily as needed for allergies. 12/08/19   Dara Hoyer, FNP  ?EPINEPHrine (AUVI-Q) 0.3 mg/0.3 mL IJ SOAJ injection Inject 0.3 mg into the muscle as needed for anaphylaxis. ?Patient taking differently:  Inject 0.3 mg into the muscle once as needed for anaphylaxis. 01/30/20   Valentina Shaggy, MD  ? ?No Known Allergies  ?Social History  ? ?Tobacco Use  ? Smoking status: Never  ? Smokeless tobacco: Never  ?Substance Use Topics  ? Alcohol use: Not Currently  ?  ?Family History  ?Problem Relation Age of Onset  ? Breast cancer Mother   ? Cancer Mother   ? Cervical cancer Maternal Grandmother   ? ?  ?Review of Systems ? ?Positive ROS: neg ? ?All other systems have been reviewed and were otherwise negative with the exception of those mentioned in the HPI and as above. ? ?Objective: ?Vital signs in last 24 hours: ?Temp:  [97.9 ?F (36.6 ?C)] 97.9 ?F (36.6 ?C) (05/15 5176) ?Pulse Rate:  [79] 79 (05/15 1607) ?Resp:  [18] 18 (05/15 3710) ?BP: (185)/(98) 185/98 (05/15 6269) ?SpO2:  [99 %] 99 % (05/15 0619) ?Weight:  [64.9 kg] 64.9 kg (05/15 0619) ? ?General Appearance: Alert, cooperative, no distress, appears stated age ?Head: Normocephalic, without obvious abnormality, atraumatic ?Eyes: PERRL, conjunctiva/corneas clear, EOM's intact    ?Neck: Supple, symmetrical, trachea midline ?Back: Symmetric, no curvature, ROM normal, no CVA tenderness ?Lungs:  respirations unlabored ?Heart: Regular rate and rhythm ?Abdomen: Soft, non-tender ?Extremities: Extremities normal, atraumatic, no cyanosis or edema ?Pulses: 2+ and symmetric all extremities ?Skin: Skin color, texture, turgor normal, no rashes or lesions ? ?NEUROLOGIC:  ? ?Mental status: Alert and oriented x4,  no aphasia, good attention span, fund of knowledge, and memory ?Motor Exam - grossly normal ?Sensory Exam - grossly normal ?Reflexes: 1+ ?Coordination - grossly normal ?Gait - grossly normal ?Balance - grossly normal ?Cranial Nerves: ?I: smell Not tested  ?II: visual acuity  OS: nl    OD: nl  ?II: visual fields Full to confrontation  ?II: pupils Equal, round, reactive to light  ?III,VII: ptosis None  ?III,IV,VI: extraocular muscles  Full ROM  ?V: mastication Normal  ?V:  facial light touch sensation  Normal  ?V,VII: corneal reflex  Present  ?VII: facial muscle function - upper  Normal  ?VII: facial muscle function - lower Normal  ?VIII: hearing Not tested  ?IX: soft palate elevation  Normal  ?IX,X: gag reflex Present  ?XI: trapezius strength  5/5  ?XI: sternocleidomastoid strength 5/5  ?XI: neck flexion strength  5/5  ?XII: tongue strength  Normal  ? ? ?Data Review ?Lab Results  ?Component Value Date  ? WBC 7.5 05/16/2021  ? HGB 14.9 05/16/2021  ? HCT 47.3 (H) 05/16/2021  ? MCV 95.0 05/16/2021  ? PLT 205 05/16/2021  ? ?Lab Results  ?Component Value Date  ? NA 141 05/16/2021  ? K 3.9 05/16/2021  ? CL 100 05/16/2021  ? CO2 32 05/16/2021  ? BUN 22 05/16/2021  ?  CREATININE 0.89 05/16/2021  ? GLUCOSE 87 05/16/2021  ? ?Lab Results  ?Component Value Date  ? INR 0.9 05/16/2021  ? ? ?Assessment/Plan: ? ?Estimated body mass index is 24.55 kg/m? as calculated from the following: ?  Height as of this encounter: '5\' 4"'$  (1.626 m). ?  Weight as of this encounter: 64.9 kg. ?Patient admitted for PLIF L3-4. Patient has failed a reasonable attempt at conservative therapy. ? ?I explained the condition and procedure to the patient and answered any questions.  Patient wishes to proceed with procedure as planned. Understands risks/ benefits and typical outcomes of procedure. ? ? ?Eustace Moore ?05/19/2021 7:19 AM ? ?

## 2021-05-19 NOTE — Anesthesia Procedure Notes (Signed)
Procedure Name: Intubation ?Date/Time: 05/19/2021 7:42 AM ?Performed by: Claris Che, CRNA ?Pre-anesthesia Checklist: Patient identified, Emergency Drugs available, Suction available, Patient being monitored and Timeout performed ?Patient Re-evaluated:Patient Re-evaluated prior to induction ?Oxygen Delivery Method: Circle system utilized ?Preoxygenation: Pre-oxygenation with 100% oxygen ?Induction Type: IV induction ?Ventilation: Mask ventilation without difficulty ?Laryngoscope Size: Mac and 4 ?Grade View: Grade I ?Tube type: Oral ?Tube size: 7.5 mm ?Number of attempts: 1 ?Airway Equipment and Method: Stylet ?Placement Confirmation: ETT inserted through vocal cords under direct vision, positive ETCO2 and breath sounds checked- equal and bilateral ?Secured at: 22 cm ?Tube secured with: Tape ?Dental Injury: Teeth and Oropharynx as per pre-operative assessment  ? ? ? ? ?

## 2021-05-19 NOTE — Op Note (Signed)
05/19/2021 ? ?10:23 AM ? ?PATIENT:  Emily Wolfe  66 y.o. female ? 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 ?POST-OPERATIVE DIAGNOSIS:  same, with superior endplate compression fracture L4 on the right ? ?PROCEDURE:   ?1. Decompressive lumbar laminectomy, hemi facetectomy and foraminotomy L3-4 on the right to fully decompress the L3 and L4 nerve roots ?2. Posterior fixation L3-4 bilaterally using Alphatec cortical pedicle screws.  ?4. Intertransverse arthrodesis L3-4 bilaterally using morcellized autograft and allograft. ? ?SURGEON:  Sherley Bounds, MD ? ?ASSISTANTS: Glenford Peers FNP ? ?ANESTHESIA:  General ? ?EBL: 100 ml ? ?Total I/O ?In: 1000 [I.V.:1000] ?Out: 750 [Urine:650; Blood:100] ? ?BLOOD ADMINISTERED:none ? ?DRAINS: none  ? ?INDICATION FOR PROCEDURE: This patient presented with right leg pain in an L3 and L4 distribution. Imaging revealed fundal listhesis L3-4 with spinal stenosis. The patient tried a reasonable attempt at conservative medical measures without relief. I recommended decompression and instrumented fusion to address the stenosis as well as the segmental  instability.  Patient understood the risks, benefits, and alternatives and potential outcomes and wished to proceed.  While doing her preoperative imaging to plan our incision we found that she had a superior endplate fracture of L4 of unknown age, and there was scalloping of the vertebral body and therefore we did not feel interbody fusion was feasible or appropriate. ? ?PROCEDURE DETAILS:  ?The patient was brought to the operating room. After induction of generalized endotracheal anesthesia the patient was rolled into the prone position on chest rolls and all pressure points were padded. The patient's lumbar region was cleaned with Betadine scrub and then prepped with DuraPrep and draped in the usual sterile fashion. Anesthesia was injected and then a dorsal midline incision  was made and carried down to the lumbosacral fascia. The fascia was opened and the paraspinous musculature was taken down in a subperiosteal fashion to expose L3-4. A self-retaining retractor was placed. Intraoperative fluoroscopy confirmed my level, and I started with placement of the L3 cortical pedicle screws. The pedicle screw entry zones were identified utilizing surface landmarks and  AP and lateral fluoroscopy. I scored the cortex with the high-speed drill and then used the hand drill to drill an upward and outward direction into the pedicle. I then tapped line to line. I then placed a 5.5 x 40 mm cortical pedicle screw into the pedicles of L3 bilaterally.  Again, we found her to have scalloping of the superior endplate of L4 and decided not to perform interbody fusion because of this. ? ? I then turned my attention to the decompression and a right lumbar laminectomy, hemi- facetectomy, and foraminotomy was performed at L3-4.  My nurse practitioner was directly involved in the decompression and exposure of the neural elements. the patient had significant spinal stenosis. The yellow ligament was removed to expose the underlying dura and nerve roots, and generous foraminotomy was performed to adequately decompress the neural elements. Both the exiting and traversing nerve roots were decompressed until a coronary dilator passed easily along the nerve roots.  We saved all bone during the decompression for later arthrodesis. ? ?We then turned our attention to the placement of the lower pedicle screws. The pedicle screw entry zones were identified utilizing surface landmarks and fluoroscopy. I drilled into each pedicle utilizing the hand drill, and tapped each pedicle with the appropriate tap. We palpated with a ball probe to assure no break in the cortex. We then placed 5.5 x 40 mm pedicle screws into  the pedicles bilaterally at L4.  My nurse practitioner assisted in placement of the pedicle screws.  We then  decorticated the transverse processes of L3 and L4 bilaterally and laid a mixture of morcellized autograft and allograft out over these to perform intertransverse arthrodesis at L3-4 bilaterally. We then placed lordotic rods into the multiaxial screw heads of the pedicle screws and locked these in position with the locking caps and anti-torque device. We then checked our construct with AP and lateral fluoroscopy. Irrigated with copious amounts of saline solution. Inspected the nerve roots once again to assure adequate decompression, lined to the dura with Gelfoam,  and then we closed the muscle and the fascia with 0 Vicryl. Closed the subcutaneous tissues with 2-0 Vicryl and subcuticular tissues with 3-0 Vicryl. The skin was closed with benzoin and Steri-Strips. Dressing was then applied, the patient was awakened from general anesthesia and transported to the recovery room in stable condition. At the end of the procedure all sponge, needle and instrument counts were correct.  ? ?PLAN OF CARE: admit to inpatient ? ?PATIENT DISPOSITION:  PACU - hemodynamically stable. ?  ?Delay start of Pharmacological VTE agent (>24hrs) due to surgical blood loss or risk of bleeding:  yes ? ? ?

## 2021-05-19 NOTE — Anesthesia Postprocedure Evaluation (Signed)
Anesthesia Post Note ? ?Patient: Emily Wolfe ? ?Procedure(s) Performed: Posterior Lumbar Interbody Fusion - Lumbar three-Lumbar four (Back) ? ?  ? ?Patient location during evaluation: PACU ?Anesthesia Type: General ?Level of consciousness: awake and alert ?Pain management: pain level controlled ?Vital Signs Assessment: post-procedure vital signs reviewed and stable ?Respiratory status: spontaneous breathing, nonlabored ventilation and respiratory function stable ?Cardiovascular status: stable and blood pressure returned to baseline ?Anesthetic complications: no ? ? ?No notable events documented. ? ?Last Vitals:  ?Vitals:  ? 05/19/21 1130 05/19/21 1202  ?BP: (!) 155/91 (!) 171/92  ?Pulse: 79 67  ?Resp: (!) 9 18  ?Temp:  36.6 ?C  ?SpO2: 96% 100%  ?  ?Last Pain:  ?Vitals:  ? 05/19/21 1030  ?TempSrc:   ?PainSc: 5   ? ? ?  ?  ?  ?  ?  ?  ? ?Audry Pili ? ? ? ? ?

## 2021-05-19 NOTE — Transfer of Care (Signed)
Immediate Anesthesia Transfer of Care Note ? ?Patient: Emily Wolfe ? ?Procedure(s) Performed: Posterior Lumbar Interbody Fusion - Lumbar three-Lumbar four (Back) ? ?Patient Location: PACU ? ?Anesthesia Type:General ? ?Level of Consciousness: awake, oriented, drowsy, patient cooperative and responds to stimulation ? ?Airway & Oxygen Therapy: Patient Spontanous Breathing and Patient connected to nasal cannula oxygen ? ?Post-op Assessment: Report given to RN, Post -op Vital signs reviewed and stable and Patient moving all extremities X 4 ? ?Post vital signs: Reviewed and stable ? ?Last Vitals:  ?Vitals Value Taken Time  ?BP 162/96 05/19/21 1031  ?Temp 36.4 ?C 05/19/21 1030  ?Pulse 85 05/19/21 1035  ?Resp 17 05/19/21 1035  ?SpO2 98 % 05/19/21 1035  ?Vitals shown include unvalidated device data. ? ?Last Pain:  ?Vitals:  ? 05/19/21 1030  ?TempSrc:   ?PainSc: 5   ?   ? ?Patients Stated Pain Goal: 4 (05/19/21 7209) ? ?Complications: No notable events documented. ?

## 2021-05-20 DIAGNOSIS — I10 Essential (primary) hypertension: Secondary | ICD-10-CM | POA: Diagnosis not present

## 2021-05-20 DIAGNOSIS — J45909 Unspecified asthma, uncomplicated: Secondary | ICD-10-CM | POA: Diagnosis not present

## 2021-05-20 DIAGNOSIS — M48061 Spinal stenosis, lumbar region without neurogenic claudication: Secondary | ICD-10-CM | POA: Diagnosis not present

## 2021-05-20 DIAGNOSIS — M4316 Spondylolisthesis, lumbar region: Secondary | ICD-10-CM | POA: Diagnosis not present

## 2021-05-20 MED ORDER — SENNOSIDES-DOCUSATE SODIUM 8.6-50 MG PO TABS: 1.0000 | ORAL_TABLET | Freq: Once | ORAL | Status: AC

## 2021-05-20 MED ORDER — SENNOSIDES-DOCUSATE SODIUM 8.6-50 MG PO TABS
1.0000 | ORAL_TABLET | Freq: Once | ORAL | Status: AC
  Administered 2021-05-20: 1 via ORAL
  Filled 2021-05-20: qty 1

## 2021-05-20 MED ORDER — OXYCODONE-ACETAMINOPHEN 5-325 MG PO TABS: 1.0000 | ORAL_TABLET | ORAL | 0 refills | Status: DC | PRN

## 2021-05-20 MED ORDER — METHOCARBAMOL 500 MG PO TABS: 500.0000 mg | ORAL_TABLET | Freq: Four times a day (QID) | ORAL | 0 refills | Status: DC | PRN

## 2021-05-20 MED ORDER — POLYETHYLENE GLYCOL 3350 17 G PO PACK
17.0000 g | PACK | Freq: Every day | ORAL | Status: DC
  Administered 2021-05-20 – 2021-05-21 (×2): 17 g via ORAL
  Filled 2021-05-20 (×2): qty 1

## 2021-05-20 MED FILL — Thrombin (Recombinant) For Soln 5000 Unit: CUTANEOUS | Qty: 5000 | Status: AC

## 2021-05-20 MED FILL — Thrombin (Recombinant) For Soln 20000 Unit: CUTANEOUS | Qty: 1 | Status: AC

## 2021-05-20 NOTE — Discharge Summary (Addendum)
Physician Discharge Summary  ?Patient ID: ?Emily Wolfe ?MRN: [id number] ?DOB/AGE: 03/13/1955 66 y.o. ? ?Admit date: 05/19/2021 ?Discharge date: 05/21/2021 ? 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   ? ? ?Discharge Diagnoses: same ? ? ?Discharged Condition: good ? ?Hospital Course: The patient was admitted on 05/19/2021 and taken to the operating room where the patient underwent PLIF L3-4. The patient tolerated the procedure well and was taken to the recovery room and then to the floor in stable condition. The hospital course was routine. There were no complications. The wound remained clean dry and intact. Pt had appropriate back soreness. No complaints of leg pain or new N/T/W. The patient remained afebrile with stable vital signs, and tolerated a regular diet. The patient continued to increase activities, and pain was well controlled with oral pain medications.  ? ?Consults: None ? ?Significant Diagnostic Studies:  ?Results for orders placed or performed during the hospital encounter of 05/19/21  ?ABO/Rh  ?Result Value Ref Range  ? ABO/RH(D)    ?  A NEG ?Performed at [facility], [place] [address], [place], [place] [address] ?  ? ? ?DG Lumbar Spine 2-3 Views ? ?Result Date: 05/19/2021 ?CLINICAL DATA:  L-spine fusion. EXAM: LUMBAR SPINE - 2-3 VIEW COMPARISON:  05/01/2021 and MR lumbar spine 04/08/2021. FINDINGS: 2 intraoperative fluoroscopic spot views of the lumbar spine are provided in the AP and lateral projections. Numbering system utilized on 04/08/2021 is preserved. L3-4 posterior lumbar interbody fusion. Degenerative disc disease. Osseous detail is degraded by technique. IMPRESSION: Intraoperative visualization for L3-4 posterior lumbar interbody fusion. Electronically Signed   By: Lorin Picket M.D.   On: 05/19/2021 10:19  ? ?DG Epidural/Nerve Root ? ?Result Date: 05/01/2021 ?CLINICAL DATA:  66 year old female with lumbosacral spondylosis  without myelopathy. She has a severe right L4 radiculopathy. Prior right L4 nerve root block performed 04/08/2021 resulted in only 10% symptom relief. She presents today for repeat injection. EXAM: EPIDURAL/NERVE ROOT FLUOROSCOPY: Radiation exposure index: 3.3 mGy reference air kerma PROCEDURE: The procedure, risks, benefits, and alternatives were explained to the patient. Questions regarding the procedure were encouraged and answered. The patient understands and consents to the procedure. RIGHT L4 NERVE ROOT BLOCK AND TRANSFORAMINAL EPIDURAL: A posterior oblique approach was taken to the intervertebral foramen on the right at L4 using a curved 22 gauge spinal needle. Injection of Omnipaque 180 outlined the L4 nerve root and showed good epidural spread. No vascular opacification is seen. 80 mg of Depo-Medrol mixed with 2 mL 1% lidocaine were instilled. The procedure was well-tolerated, and the patient was discharged thirty minutes following the injection in good condition. COMPLICATIONS: None IMPRESSION: Technically successful injection consisting of a right L4 nerve root block and transforaminal epidural. Electronically Signed   By: [name] Cadet M.D.   On: 05/01/2021 10:44  ? ?DG C-Arm 1-60 Min-No Report ? ?Result Date: 05/19/2021 ?Fluoroscopy was utilized by the requesting physician.  No radiographic interpretation.  ? ?DG C-Arm 1-60 Min-No Report ? ?Result Date: 05/19/2021 ?Fluoroscopy was utilized by the requesting physician.  No radiographic interpretation.   ? ?Antibiotics:  ?Anti-infectives (From admission, onward)  ? ? Start     Dose/Rate Route Frequency Ordered Stop  ? 05/19/21 1600  ceFAZolin (ANCEF) IVPB 2g/100 mL premix       ? 2 g ?200 mL/hr over 30 Minutes Intravenous Every 8 hours 05/19/21 1137 05/19/21 2354  ? 05/19/21 0648  ceFAZolin (ANCEF) 2-4 GM/100ML-% IVPB       ?  Note to Pharmacy: Jovita Kussmaul R: cabinet override  ?    05/19/21 0648 05/19/21 0747  ? 05/19/21 0645  ceFAZolin (ANCEF) IVPB  2g/100 mL premix       ? 2 g ?200 mL/hr over 30 Minutes Intravenous On call to O.R. 05/19/21 6712 05/19/21 0757  ? ?  ? ? ?Discharge Exam: ?Blood pressure 121/76, pulse 94, temperature 98.2 ?F (36.8 ?C), temperature source Oral, resp. rate 16, height '5\' 4"'$  (1.626 m), weight 64.9 kg, SpO2 97 %. ?Neurologic: Grossly normal ?Ambulating and voiding well incision cdi  ? ?Discharge Medications:   ?Allergies as of 05/20/2021   ?No Known Allergies ?  ? ?  ?Medication List  ?  ? ?STOP taking these medications   ? ?meloxicam 15 MG tablet ?Commonly known as: MOBIC ?  ?Oxycodone HCl 10 MG Tabs ?  ?oxyCODONE-acetaminophen 10-325 MG tablet ?Commonly known as: Percocet ?Replaced by: oxyCODONE-acetaminophen 5-325 MG tablet ?  ? ?  ? ?TAKE these medications   ? ?acetaminophen 500 MG tablet ?Commonly known as: TYLENOL ?Take 500 mg by mouth every 6 (six) hours as needed (for pain.). ?  ?AMBULATORY NON FORMULARY MEDICATION ?Walker.  ?Use as needed.  ?Disp 1 ?M25.551 ?  ?azelastine 0.1 % nasal spray ?Commonly known as: ASTELIN ?PLACE 2 SPRAYS INTO BOTH NOSTRILS 2 TIMES DAILY. ?What changed: See the new instructions. ?  ?DULoxetine 60 MG capsule ?Commonly known as: CYMBALTA ?Take 60 mg by mouth in the morning. ?  ?EPINEPHrine 0.3 mg/0.3 mL Soaj injection ?Commonly known as: Auvi-Q ?Inject 0.3 mg into the muscle as needed for anaphylaxis. ?What changed: when to take this ?  ?gabapentin 300 MG capsule ?Commonly known as: NEURONTIN ?Take 1 capsule (300 mg total) by mouth 3 (three) times daily as needed. ?  ?leflunomide 10 MG tablet ?Commonly known as: ARAVA ?Take 10 mg by mouth every evening. ?  ?loratadine 10 MG tablet ?Commonly known as: CLARITIN ?Take 10 mg by mouth daily as needed for allergies. ?  ?methocarbamol 500 MG tablet ?Commonly known as: ROBAXIN ?Take 1 tablet (500 mg total) by mouth every 6 (six) hours as needed for muscle spasms. ?  ?ondansetron 8 MG disintegrating tablet ?Commonly known as: ZOFRAN-ODT ?Take 8 mg by mouth  every 8 (eight) hours as needed. ?  ?oxyCODONE-acetaminophen 5-325 MG tablet ?Commonly known as: Percocet ?Take 1 tablet by mouth every 4 (four) hours as needed for severe pain. ?Replaces: oxyCODONE-acetaminophen 10-325 MG tablet ?  ?senna-docusate 8.6-50 MG tablet ?Commonly known as: Senokot-S ?Take 1 tablet by mouth at bedtime as needed for mild constipation or moderate constipation. ?What changed: when to take this ?  ? ?  ? ?  ?  ? ? ?  ?Durable Medical Equipment  ?(From admission, onward)  ?  ? ? ?  ? ?  Start     Ordered  ? 05/19/21 1154  DME Walker rolling  Once       ?Question:  Patient needs a walker to treat with the following condition  Answer:  S/P lumbar fusion  ? 05/19/21 1153  ? 05/19/21 1154  DME 3 n 1  Once       ? 05/19/21 1153  ? ?  ?  ? ?  ? ? ?Disposition: home  ? ?Final Dx: PLIF L3-4 ? ? ? ? ? ? ?Signed: ?Ocie Cornfield Romelle Muldoon ?05/20/2021, 8:05 AM ?  ?

## 2021-05-20 NOTE — Progress Notes (Signed)
PT Cancellation Note and Discharge ? ?Patient Details ?Name: Emily Wolfe ?MRN: [id number] ?DOB: 02/13/1955 ? ? ?Cancelled Treatment:    Reason Eval/Treat Not Completed: PT screened, no needs identified, will sign off. Discussed pt case with OT who reports pt is mobilizing well and does not require a formal PT evaluation at this time. Pt does not have to negotiate stairs at home. Will sign off, if needs change please reconsult.  ? ? ?Thelma Comp ?05/20/2021, 9:08 AM ? ?Rolinda Roan, PT, DPT ?Acute Rehabilitation Services ?Secure Chat Preferred ?Office: [phone] ? ?

## 2021-05-20 NOTE — Evaluation (Signed)
Occupational Therapy Evaluation and Discharge ?Patient Details ?Name: Emily Wolfe ?MRN: [id number] ?DOB: August 04, 1955 ?Today's Date: 05/20/2021 ? ? ?History of Present Illness Pt is a 66 yo female who is now s/p decompressive lumbar laminectomy, hemi facetectomy and foraminotomy L3-4 on the right to fully decompress the L3 and L4 nerve roots and posterior fixation L3-4 bilaterally. PHMx: MS  ? ?Clinical Impression ?  ?This 66 yo female admitted with above presents to acute OT with all education completed and handout provided. No PT needs identified--made them aware. OT will sign off.  ?   ? ?Recommendations for follow up therapy are one component of a multi-disciplinary discharge planning process, led by the attending physician.  Recommendations may be updated based on patient status, additional functional criteria and insurance authorization.  ? ?Follow Up Recommendations ? No OT follow up  ?  ?Assistance Recommended at Discharge PRN  ?Patient can return home with the following Assistance with cooking/housework ? ?  ?Functional Status Assessment ? Patient has had a recent decline in their functional status and demonstrates the ability to make significant improvements in function in a reasonable and predictable amount of time. (no further skilled OT needs identified, all education completed)  ?Equipment Recommendations ? None recommended by OT  ?  ?   ?Precautions / Restrictions Precautions ?Precautions: Back ?Precaution Booklet Issued: Yes (comment) ?Precaution Comments: VCs for no twisting required during session ?Required Braces or Orthoses: Spinal Brace ?Spinal Brace: Lumbar corset;Applied in sitting position ?Restrictions ?Weight Bearing Restrictions: No  ? ?  ? ?Mobility Bed Mobility ?Overal bed mobility: Modified Independent ?  ?  ?  ?  ?  ?  ?  ?  ? ?Transfers ?Overall transfer level: Independent ?  ?  ?  ?  ?  ?  ?  ?  ?General transfer comment: Pt ambulatd in hallway with me without AD and no LOB, reports  she has been getting up to bathroom by herself without issues ?  ? ?  ?Balance Overall balance assessment: Independent ?  ?  ?  ?  ?  ?  ?  ?  ?  ?  ?  ?  ?  ?  ?  ?  ?  ?  ?   ? ?ADL either performed or assessed with clinical judgement  ? ?ADL Overall ADL's : Modified independent ?  ?  ?  ?  ?  ?  ?  ?  ?  ?  ?  ?  ?  ?  ?  ?  ?  ?  ?  ?General ADL Comments: Educated on crossing legs to get to feet for socks and shoes, using wet wipes for back peri care, using 2 cups for brushing teeth to avoid bending over sink, not sitting for more than 20-30 minutes at a time building up to an hour. Pt able to donn and doff her own brace.  ? ? ? ?Vision Patient Visual Report: No change from baseline ?   ?   ?   ?   ? ?Pertinent Vitals/Pain Pain Assessment ?Pain Assessment: 0-10 ?Pain Score: 2  ?Pain Location: incisional ?Pain Descriptors / Indicators: Sore ?Pain Intervention(s): Limited activity within patient's tolerance, Monitored during session, Repositioned  ? ? ? ?Hand Dominance Right ?  ?Extremity/Trunk Assessment Upper Extremity Assessment ?Upper Extremity Assessment: Overall WFL for tasks assessed ?  ?  ?  ?  ?  ?Communication Communication ?Communication: No difficulties ?  ?Cognition Arousal/Alertness: Awake/alert ?Behavior During Therapy: [facility] for tasks  assessed/performed ?Overall Cognitive Status: Within Functional Limits for tasks assessed ?  ?  ?  ?  ?  ?  ?  ?  ?  ?  ?  ?  ?  ?  ?  ?  ?  ?  ?  ?   ?   ?   ? ? ?Home Living Family/patient expects to be discharged to:: Private residence ?Living Arrangements: Alone ?Available Help at Discharge: Friend(s);Available PRN/intermittently ?Type of Home: House ?Home Access: Level entry ?  ?  ?Home Layout: One level ?  ?  ?Bathroom Shower/Tub: Walk-in shower ?  ?Bathroom Toilet: Standard ?  ?  ?Home Equipment: BSC/3in1;Adaptive equipment ?Adaptive Equipment: [place] ?  ?  ? ?  ?Prior Functioning/Environment Prior Level of Function : Independent/Modified Independent ?  ?  ?  ?   ?  ?  ?  ?  ?  ? ?  ?  ?OT Problem List: Decreased range of motion;Pain ?  ?   ?   ?OT Goals(Current goals can be found in the care plan section) Acute Rehab OT Goals ?Patient Stated Goal: maybe home today as long as I have a bowel movement  ?   ? ?   ?AM-PAC OT "6 Clicks" Daily Activity     ?Outcome Measure Help from another person eating meals?: None ?Help from another person taking care of personal grooming?: None ?Help from another person toileting, which includes using toliet, bedpan, or urinal?: None ?Help from another person bathing (including washing, rinsing, drying)?: None ?Help from another person to put on and taking off regular upper body clothing?: None ?Help from another person to put on and taking off regular lower body clothing?: None ?6 Click Score: 24 ?  ?End of Session Nurse Communication:  (no further OT needs, no PT needs identified) ? ?Activity Tolerance: Patient tolerated treatment well ?Patient left: in bed;with call bell/phone within reach ? ?OT Visit Diagnosis: Pain ?Pain - part of body:  (incisional)  ?              ?Time: 0737-1062 ?OT Time Calculation (min): 30 min ?Charges:  OT General Charges ?$OT Visit: 1 Visit ?OT Evaluation ?$OT Eval Moderate Complexity: 1 Mod ?OT Treatments ?$Self Care/Home Management : 8-22 mins ? ?Emily Wolfe, OTR/L ?Acute Rehab Services ?Pager [phone] ?Office [phone] ? ? ? ?Almon Register ?05/20/2021, 9:13 AM ?

## 2021-05-21 DIAGNOSIS — J45909 Unspecified asthma, uncomplicated: Secondary | ICD-10-CM | POA: Diagnosis not present

## 2021-05-21 DIAGNOSIS — M4316 Spondylolisthesis, lumbar region: Secondary | ICD-10-CM | POA: Diagnosis not present

## 2021-05-21 DIAGNOSIS — M48061 Spinal stenosis, lumbar region without neurogenic claudication: Secondary | ICD-10-CM | POA: Diagnosis not present

## 2021-05-21 DIAGNOSIS — I10 Essential (primary) hypertension: Secondary | ICD-10-CM | POA: Diagnosis not present

## 2021-05-21 NOTE — Progress Notes (Signed)
Patient alert and oriented, mae's well, voiding adequate amount of urine, swallowing without difficulty, no c/o pain at time of discharge. Patient discharged home with family. Script and discharged instructions given to patient. Patient and family stated understanding of instructions given. Patient has an appointment with Dr. Jones in 2 weeks ?

## 2021-05-22 ENCOUNTER — Encounter (HOSPITAL_COMMUNITY): Payer: Self-pay | Admitting: Neurological Surgery

## 2021-05-27 NOTE — Progress Notes (Signed)
Anesthesia APP Update: Received records from [facility] in [place], [place].  She seen Dr. Robyn Haber, DO, last 44/19 for follow-up history of nonrheumatic valve prolapse and right bundle branch block.  Patient was doing well and had recently bought a home in [place].  2018 echo showed no evidence of MVP or mitral regurgitation.  Echo 02/23/16: Summary: The study was technically adequate. Normal LVEF with normal wall motion.  EF 63.4%. Diastolic pattern normal for age. No evidence of MVP. Trace TR.  She had a normal exercise stress echo in 11/2011.  Myra Gianotti, PA-C Surgical Short Stay/Anesthesiology [facility] Phone [phone] [facility] Phone [phone] 05/27/2021 2:08 PM

## 2021-05-27 NOTE — Addendum Note (Signed)
Addendum  created 05/27/21 1410 by Jacinta Shoe, PA-C   Clinical Note Signed

## 2021-06-06 DIAGNOSIS — G35 Multiple sclerosis: Secondary | ICD-10-CM | POA: Diagnosis not present

## 2021-06-18 DIAGNOSIS — J343 Hypertrophy of nasal turbinates: Secondary | ICD-10-CM | POA: Diagnosis not present

## 2021-06-18 DIAGNOSIS — J342 Deviated nasal septum: Secondary | ICD-10-CM | POA: Diagnosis not present

## 2021-06-18 DIAGNOSIS — R0982 Postnasal drip: Secondary | ICD-10-CM | POA: Diagnosis not present

## 2021-06-18 DIAGNOSIS — J310 Chronic rhinitis: Secondary | ICD-10-CM | POA: Diagnosis not present

## 2021-06-19 ENCOUNTER — Ambulatory Visit (INDEPENDENT_AMBULATORY_CARE_PROVIDER_SITE_OTHER): Payer: Medicare Other

## 2021-06-19 DIAGNOSIS — J309 Allergic rhinitis, unspecified: Secondary | ICD-10-CM

## 2021-07-04 ENCOUNTER — Ambulatory Visit (INDEPENDENT_AMBULATORY_CARE_PROVIDER_SITE_OTHER): Payer: Medicare Other

## 2021-07-04 DIAGNOSIS — J309 Allergic rhinitis, unspecified: Secondary | ICD-10-CM | POA: Diagnosis not present

## 2021-07-06 DIAGNOSIS — G35 Multiple sclerosis: Secondary | ICD-10-CM | POA: Diagnosis not present

## 2021-07-10 ENCOUNTER — Ambulatory Visit (INDEPENDENT_AMBULATORY_CARE_PROVIDER_SITE_OTHER): Payer: Medicare Other

## 2021-07-10 DIAGNOSIS — J309 Allergic rhinitis, unspecified: Secondary | ICD-10-CM

## 2021-07-10 DIAGNOSIS — M5416 Radiculopathy, lumbar region: Secondary | ICD-10-CM | POA: Diagnosis not present

## 2021-07-25 ENCOUNTER — Ambulatory Visit (INDEPENDENT_AMBULATORY_CARE_PROVIDER_SITE_OTHER): Payer: Medicare Other

## 2021-07-25 DIAGNOSIS — J309 Allergic rhinitis, unspecified: Secondary | ICD-10-CM | POA: Diagnosis not present

## 2021-08-06 DIAGNOSIS — G35 Multiple sclerosis: Secondary | ICD-10-CM | POA: Diagnosis not present

## 2021-08-07 ENCOUNTER — Ambulatory Visit (INDEPENDENT_AMBULATORY_CARE_PROVIDER_SITE_OTHER): Payer: Medicare Other

## 2021-08-07 DIAGNOSIS — J309 Allergic rhinitis, unspecified: Secondary | ICD-10-CM

## 2021-08-07 DIAGNOSIS — M5416 Radiculopathy, lumbar region: Secondary | ICD-10-CM | POA: Diagnosis not present

## 2021-08-13 ENCOUNTER — Encounter (INDEPENDENT_AMBULATORY_CARE_PROVIDER_SITE_OTHER): Payer: Self-pay

## 2021-08-15 ENCOUNTER — Ambulatory Visit (INDEPENDENT_AMBULATORY_CARE_PROVIDER_SITE_OTHER): Payer: Medicare Other

## 2021-08-15 DIAGNOSIS — Z0000 Encounter for general adult medical examination without abnormal findings: Secondary | ICD-10-CM | POA: Diagnosis not present

## 2021-08-15 NOTE — Progress Notes (Signed)
Virtual Visit via Telephone Note  I connected with  Emily Wolfe on 08/15/21 at  8:00 AM EDT by telephone and verified that I am speaking with the correct person using two identifiers.  Medicare Annual Wellness visit completed telephonically due to Covid-19 pandemic.   Persons participating in this call: This Health Coach and this patient.   Location: Patient: home Provider: office   I discussed the limitations, risks, security and privacy concerns of performing an evaluation and management service by telephone and the availability of in person appointments. The patient expressed understanding and agreed to proceed.  Unable to perform video visit due to video visit attempted and failed and/or patient does not have video capability.   Some vital signs may be absent or patient reported.   Willette Brace, LPN   Subjective:   Emily Wolfe is a 66 y.o. female who presents for an Initial Medicare Annual Wellness Visit.  Review of Systems     Cardiac Risk Factors include: advanced age (>60mn, >>83women);hypertension;dyslipidemia     Objective:    There were no vitals filed for this visit. There is no height or weight on file to calculate BMI.     08/15/2021    8:05 AM 05/16/2021    1:33 PM 03/30/2021    7:11 AM 02/10/2021    8:34 PM 11/24/2020    1:42 AM 08/31/2020    9:38 AM 08/31/2020    1:59 AM  Advanced Directives  Does Patient Have a Medical Advance Directive? Yes Yes No No No Yes Yes  Type of AParamed[place]of [place] will Living will;Healthcare Power of [place] will [place] Harbor[place] will  Does patient want to make changes to medical advance directive?  No - Patient declined    No - Patient declined   Copy of [place] Chart? No - copy requested No - copy requested    No - copy requested No - copy requested  Would patient like information on creating a medical  advance directive?   No - Patient declined   No - Patient declined No - Patient declined    Current Medications (verified) Outpatient Encounter Medications as of 08/15/2021  Medication Sig   azelastine (ASTELIN) 0.1 % nasal spray PLACE 2 SPRAYS INTO BOTH NOSTRILS 2 TIMES DAILY. (Patient taking differently: Place 1 spray into both nostrils daily as needed for allergies.)   Cholecalciferol (VITAMIN D3) 1.25 MG (50000 UT) CAPS Take 1 capsule by mouth once a week.   DULoxetine (CYMBALTA) 60 MG capsule Take 60 mg by mouth in the morning.   EPINEPHrine (AUVI-Q) 0.3 mg/0.3 mL IJ SOAJ injection Inject 0.3 mg into the muscle as needed for anaphylaxis. (Patient taking differently: Inject 0.3 mg into the muscle once as needed for anaphylaxis.)   leflunomide (ARAVA) 10 MG tablet Take 10 mg by mouth every evening.   [DISCONTINUED] acetaminophen (TYLENOL) 500 MG tablet Take 500 mg by mouth every 6 (six) hours as needed (for pain.).   [DISCONTINUED] AMBULATORY NON FORMULARY MEDICATION [place].  Use as needed.  Disp 1 M25.551   [DISCONTINUED] gabapentin (NEURONTIN) 300 MG capsule Take 1 capsule (300 mg total) by mouth 3 (three) times daily as needed.   [DISCONTINUED] loratadine (CLARITIN) 10 MG tablet Take 10 mg by mouth daily as needed for allergies.   [DISCONTINUED] methocarbamol (ROBAXIN) 500 MG tablet Take 1 tablet (500 mg total) by mouth every 6 (six) hours as needed  for muscle spasms.   [DISCONTINUED] ondansetron (ZOFRAN-ODT) 8 MG disintegrating tablet Take 8 mg by mouth every 8 (eight) hours as needed.   [DISCONTINUED] oxyCODONE-acetaminophen (PERCOCET) 5-325 MG tablet Take 1 tablet by mouth every 4 (four) hours as needed for severe pain.   [DISCONTINUED] senna-docusate (SENOKOT-S) 8.6-50 MG tablet Take 1 tablet by mouth at bedtime as needed for mild constipation or moderate constipation. (Patient taking differently: Take 1 tablet by mouth at bedtime.)   No facility-administered encounter medications on  file as of 08/15/2021.    Allergies (verified) Patient has no known allergies.   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   Mild intermittent asthma without complication 48/18/5631   Multiple sclerosis ([place]) 06/29/2017   Neuromuscular disorder ([place])    Osteoarthritis    Palpitations    Postmenopausal 06/29/2017   Scoliosis of lumbar spine 06/29/2017   Small bowel obstruction ([place]) 06/29/2017   Swallowing difficulty    Umbilical hernia    Urticaria    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   Vitamin D deficiency 06/29/2017   Past Surgical History:  Procedure Laterality Date   ADENOIDECTOMY     CHOLECYSTECTOMY  2009   HERNIA REPAIR     HERNIA REPAIR     6/10, 9/10   HYSTERECTOMY ABDOMINAL WITH SALPINGO-OOPHORECTOMY     HYSTEROTOMY  2008   LAPAROSCOPIC LYSIS OF ADHESIONS     , With small bowel resection and ventral hernia repair with mesh 2019/[place]   MOHS SURGERY     SINOSCOPY     VARICOSE VEIN SURGERY     Family History  Problem Relation Age of Onset   Breast cancer Mother    Cancer Mother    Cervical cancer Maternal Grandmother    Social History   Socioeconomic History   Marital status: Divorced    Spouse name: Not on file   Number of children: Not on file   Years of education: Not on file   Highest education level: Not on file  Occupational  History   Not on file  Tobacco Use   Smoking status: Never   Smokeless tobacco: Never  Vaping Use   Vaping Use: Never used  Substance and Sexual Activity   Alcohol use: Not Currently   Drug use: Not Currently   Sexual activity: Not Currently  Other Topics Concern   Not on file  Social History Narrative   The patient does wear seatbelts. The patient does use sunscreen or protective clothing regularly. She does participate in regular exercise. Her exercise is: elliptical, approximately one time per week. She does not have a secured firearm in the home. She denies a history of intimate partner violence.   Social Determinants of Health   Financial Resource Strain: Low Risk  (08/15/2021)  Overall Financial Resource Strain (CARDIA)    Difficulty of Paying Living Expenses: Not hard at all  Food Insecurity: No Food Insecurity (08/15/2021)   Hunger Vital Sign    Worried About Running Out of Food in the Last Year: Never true    Ran Out of Food in the Last Year: Never true  Transportation Needs: No Transportation Needs (08/15/2021)   PRAPARE - Hydrologist (Medical): No    Lack of Transportation (Non-Medical): No  Physical Activity: Inactive (08/15/2021)   Exercise Vital Sign    Days of Exercise per Week: 0 days    Minutes of Exercise per Session: 0 min  Stress: No Stress Concern Present (08/15/2021)   [place]    Feeling of Stress : Not at all  Social Connections: Socially Isolated (08/15/2021)   Social Connection and Isolation Panel [NHANES]    Frequency of Communication with Friends and Family: More than three times a week    Frequency of Social Gatherings with Friends and Family: More than three times a week    Attends Religious Services: Never    Marine scientist or Organizations: No    Attends Music therapist: Never    Marital Status: Divorced    Tobacco  Counseling Counseling given: Not Answered   Clinical Intake:  Pre-visit preparation completed: Yes  Pain : No/denies pain     BMI - recorded: 24.62 Nutritional Status: BMI of 19-24  Normal Nutritional Risks: None Diabetes: No  How often do you need to have someone help you when you read instructions, pamphlets, or other written materials from your doctor or pharmacy?: 1 - Never  Diabetic?no  Interpreter Needed?: No  Information entered by :: Charlott Rakes, LPN   Activities of Daily Living    08/15/2021    8:08 AM 05/16/2021    1:37 PM  In your present state of health, do you have any difficulty performing the following activities:  Hearing? 0   Vision? 0   Difficulty concentrating or making decisions? 0   Walking or climbing stairs? 0   Dressing or bathing? 0   Doing errands, shopping? 0 0  Preparing Food and eating ? N   Using the Toilet? N   In the past six months, have you accidently leaked urine? N   Do you have problems with loss of bowel control? N   Managing your Medications? N   Managing your Finances? N     Patient Care Team: Vivi Barrack, MD as PCP - General (Family Medicine) Dorien Chihuahua, MD as Referring Physician (Obstetrics and Gynecology) Valentina Shaggy, MD as Consulting Physician (Allergy and Immunology) Janece Canterbury, MD as Referring Physician (Gynecology) Melina Schools, MD as Consulting Physician (Orthopedic Surgery) Deneise Lever, MD as Consulting Physician (Neurology)  Indicate any recent Medical Services you may have received from other than [facility] providers in the past year (date may be approximate).     Assessment:   This is a routine wellness examination for [place].  Hearing/Vision screen Hearing Screening - Comments:: Pt denies any hearing issues  Vision Screening - Comments:: Pt encouraged to follow up with provider   Dietary issues and exercise activities discussed: Current Exercise Habits: The patient does  not participate in regular exercise at present   Goals Addressed             This Visit's Progress    Patient Stated  Stay healthy       Depression Screen    08/15/2021    8:02 AM 11/22/2020   11:41 AM 09/25/2019    9:07 AM 06/13/2018    1:40 PM 12/15/2017    1:21 PM  PHQ 2/9 Scores  PHQ - 2 Score 0 0 2 0 1  PHQ- 9 Score   '9 3 8    '$ Fall Risk    08/15/2021    8:06 AM 11/22/2020   11:41 AM 06/13/2018    1:39 PM 12/15/2017    1:20 PM  Fall Risk   Falls in the past year? 1 0 0 1  Number falls in past yr: 1 0 0 1  Injury with Fall? 0  0 1  Risk for fall due to : Impaired vision   History of fall(s);Other (Comment)  Risk for fall due to: Comment    History of MS   Follow up Falls prevention discussed       Diagonal:  Any stairs in or around the home? No  If so, are there any without handrails? No  Home free of loose throw rugs in walkways, pet beds, electrical cords, etc? Yes  Adequate lighting in your home to reduce risk of falls? Yes   ASSISTIVE DEVICES UTILIZED TO PREVENT FALLS:  Life alert? No  Use of a cane, walker or w/c? No  Grab bars in the bathroom? Yes  Shower chair or bench in shower? No  Elevated toilet seat or a handicapped toilet? No   TIMED UP AND GO:  Was the test performed? No .   Cognitive Function:        08/15/2021    8:09 AM  6CIT Screen  What Year? 0 points  What month? 0 points  What time? 0 points  Count back from 20 0 points  Months in reverse 0 points  Repeat phrase 0 points  Total Score 0 points    Immunizations Immunization History  Administered Date(s) Administered   DTaP 05/20/1973   Fluad Quad(high Dose 65+) 11/22/2020   IPV 05/20/1973   PFIZER(Purple Top)SARS-COV-2 Vaccination 03/31/2019, 04/25/2019, 03/26/2021   Pneumococcal-Unspecified 01/07/2021   Td 02/06/2007   Tdap 06/11/2020   Zoster Recombinat (Shingrix) 12/26/2019, 04/24/2020    TDAP status: Up to date  Flu  Vaccine status: Up to date  Pneumococcal vaccine status: Up to date   Covid-19 vaccine status: Completed vaccines  Qualifies for Shingles Vaccine? Yes   Zostavax completed No   Shingrix completed   Screening Tests Health Maintenance  Topic Date Due   Pneumonia Vaccine 75+ Years old (1 - PCV) 08/16/2020   PAP SMEAR-Modifier  11/19/2020   MAMMOGRAM  02/14/2021   COVID-19 Vaccine (4 - Pfizer risk series) 05/21/2021   INFLUENZA VACCINE  08/05/2021   COLONOSCOPY (Pts 45-60yr Insurance coverage will need to be confirmed)  12/24/2027   TETANUS/TDAP  06/12/2030   DEXA SCAN  Completed   Hepatitis C Screening  Completed   HIV Screening  Completed   Zoster Vaccines- Shingrix  Completed   HPV VACCINES  Aged Out    Health Maintenance  Health Maintenance Due  Topic Date Due   Pneumonia Vaccine 66 Years old (1 - PCV) 08/16/2020   PAP SMEAR-Modifier  11/19/2020   MAMMOGRAM  02/14/2021   COVID-19 Vaccine (4 - Pfizer risk series) 05/21/2021   INFLUENZA VACCINE  08/05/2021    Colorectal cancer screening: Type of screening: Colonoscopy. Completed 12/23/17. Repeat every 10  years  Mammogram scheduled 08/30/21  Bone Density status: Completed 05/06/21. Results reflect: Bone density results: OSTEOPENIA. Repeat every 2 years.   Additional Screening:  Hepatitis C Screening: Completed 12/23/20  Vision Screening: Recommended annual ophthalmology exams for early detection of glaucoma and other disorders of the eye. Is the patient up to date with their annual eye exam?  Yes  Who is the provider or what is the name of the office in which the patient attends annual eye exams? [facility] opthalmology  If pt is not established with a provider, would they like to be referred to a provider to establish care? No .   Dental Screening: Recommended annual dental exams for proper oral hygiene  Community Resource Referral / Chronic Care Management: CRR required this visit?  No   CCM required this  visit?  No      Plan:     I have personally reviewed and noted the following in the patient's chart:   Medical and social history Use of alcohol, tobacco or illicit drugs  Current medications and supplements including opioid prescriptions. Patient is not currently taking opioid prescriptions. Functional ability and status Nutritional status Physical activity Advanced directives List of other physicians Hospitalizations, surgeries, and ER visits in previous 12 months Vitals Screenings to include cognitive, depression, and falls Referrals and appointments  In addition, I have reviewed and discussed with patient certain preventive protocols, quality metrics, and best practice recommendations. A written personalized care plan for preventive services as well as general preventive health recommendations were provided to patient.     Willette Brace, LPN   [id number]/[id number]   Nurse Notes: none

## 2021-08-15 NOTE — Patient Instructions (Signed)
[name] , Thank you for taking time to come for your Medicare Wellness Visit. I appreciate your ongoing commitment to your health goals. Please review the following plan we discussed and let me know if I can assist you in the future.   Screening recommendations/referrals: Colonoscopy: Done 12/23/17 repeat every 10 years  Mammogram: scheduled 08/30/21 Bone Density: 05/06/21 repeat every 2 years  Recommended yearly ophthalmology/optometry visit for glaucoma screening and checkup Recommended yearly dental visit for hygiene and checkup  Vaccinations: Influenza vaccine: done 11/22/20 repeat every year  Pneumococcal vaccine: done 01/07/21  Tdap vaccine: done 06/11/20 repeat every 10 years Shingles vaccine: completed 12/26/19, 04/24/20   Covid-19:completed 3/26, 04/25/19   Advanced directives: Please bring a copy of your health care power of attorney and living will to the office at your convenience.  Conditions/risks identified: stay healthy   Next appointment: Follow up in one year for your annual wellness visit    Preventive Care 65 Years and Older, Female Preventive care refers to lifestyle choices and visits with your health care provider that can promote health and wellness. What does preventive care include? A yearly physical exam. This is also called an annual well check. Dental exams once or twice a year. Routine eye exams. Ask your health care provider how often you should have your eyes checked. Personal lifestyle choices, including: Daily care of your teeth and gums. Regular physical activity. Eating a healthy diet. Avoiding tobacco and drug use. Limiting alcohol use. Practicing safe sex. Taking low-dose aspirin every day. Taking vitamin and mineral supplements as recommended by your health care provider. What happens during an annual well check? The services and screenings done by your health care provider during your annual well check will depend on your age, overall health,  lifestyle risk factors, and family history of disease. Counseling  Your health care provider may ask you questions about your: Alcohol use. Tobacco use. Drug use. Emotional well-being. Home and relationship well-being. Sexual activity. Eating habits. History of falls. Memory and ability to understand (cognition). Work and work Statistician. Reproductive health. Screening  You may have the following tests or measurements: Height, weight, and BMI. Blood pressure. Lipid and cholesterol levels. These may be checked every 5 years, or more frequently if you are over 11 years old. Skin check. Lung cancer screening. You may have this screening every year starting at age 95 if you have a 30-pack-year history of smoking and currently smoke or have quit within the past 15 years. Fecal occult blood test (FOBT) of the stool. You may have this test every year starting at age 91. Flexible sigmoidoscopy or colonoscopy. You may have a sigmoidoscopy every 5 years or a colonoscopy every 10 years starting at age 94. Hepatitis C blood test. Hepatitis B blood test. Sexually transmitted disease (STD) testing. Diabetes screening. This is done by checking your blood sugar (glucose) after you have not eaten for a while (fasting). You may have this done every 1-3 years. Bone density scan. This is done to screen for osteoporosis. You may have this done starting at age 78. Mammogram. This may be done every 1-2 years. Talk to your health care provider about how often you should have regular mammograms. Talk with your health care provider about your test results, treatment options, and if necessary, the need for more tests. Vaccines  Your health care provider may recommend certain vaccines, such as: Influenza vaccine. This is recommended every year. Tetanus, diphtheria, and acellular pertussis (Tdap, Td) vaccine. You may need  a Td booster every 10 years. Zoster vaccine. You may need this after age  60. Pneumococcal 13-valent conjugate (PCV13) vaccine. One dose is recommended after age 72. Pneumococcal polysaccharide (PPSV23) vaccine. One dose is recommended after age 78. Talk to your health care provider about which screenings and vaccines you need and how often you need them. This information is not intended to replace advice given to you by your health care provider. Make sure you discuss any questions you have with your health care provider. Document Released: 01/18/2015 Document Revised: 09/11/2015 Document Reviewed: 10/23/2014 Elsevier Interactive Patient Education  2017 [place] Prevention in the Home Falls can cause injuries. They can happen to people of all ages. There are many things you can do to make your home safe and to help prevent falls. What can I do on the outside of my home? Regularly fix the edges of walkways and driveways and fix any cracks. Remove anything that might make you trip as you walk through a door, such as a raised step or threshold. Trim any bushes or trees on the path to your home. Use bright outdoor lighting. Clear any walking paths of anything that might make someone trip, such as rocks or tools. Regularly check to see if handrails are loose or broken. Make sure that both sides of any steps have handrails. Any raised decks and porches should have guardrails on the edges. Have any leaves, snow, or ice cleared regularly. Use sand or salt on walking paths during winter. Clean up any spills in your garage right away. This includes oil or grease spills. What can I do in the bathroom? Use night lights. Install grab bars by the toilet and in the tub and shower. Do not use towel bars as grab bars. Use non-skid mats or decals in the tub or shower. If you need to sit down in the shower, use a plastic, non-slip stool. Keep the floor dry. Clean up any water that spills on the floor as soon as it happens. Remove soap buildup in the tub or shower  regularly. Attach bath mats securely with double-sided non-slip rug tape. Do not have throw rugs and other things on the floor that can make you trip. What can I do in the bedroom? Use night lights. Make sure that you have a light by your bed that is easy to reach. Do not use any sheets or blankets that are too big for your bed. They should not hang down onto the floor. Have a firm chair that has side arms. You can use this for support while you get dressed. Do not have throw rugs and other things on the floor that can make you trip. What can I do in the kitchen? Clean up any spills right away. Avoid walking on wet floors. Keep items that you use a lot in easy-to-reach places. If you need to reach something above you, use a strong step stool that has a grab bar. Keep electrical cords out of the way. Do not use floor polish or wax that makes floors slippery. If you must use wax, use non-skid floor wax. Do not have throw rugs and other things on the floor that can make you trip. What can I do with my stairs? Do not leave any items on the stairs. Make sure that there are handrails on both sides of the stairs and use them. Fix handrails that are broken or loose. Make sure that handrails are as long as the stairways. Check  any carpeting to make sure that it is firmly attached to the stairs. Fix any carpet that is loose or worn. Avoid having throw rugs at the top or bottom of the stairs. If you do have throw rugs, attach them to the floor with carpet tape. Make sure that you have a light switch at the top of the stairs and the bottom of the stairs. If you do not have them, ask someone to add them for you. What else can I do to help prevent falls? Wear shoes that: Do not have high heels. Have rubber bottoms. Are comfortable and fit you well. Are closed at the toe. Do not wear sandals. If you use a stepladder: Make sure that it is fully opened. Do not climb a closed stepladder. Make sure that  both sides of the stepladder are locked into place. Ask someone to hold it for you, if possible. Clearly mark and make sure that you can see: Any grab bars or handrails. First and last steps. Where the edge of each step is. Use tools that help you move around (mobility aids) if they are needed. These include: Canes. Walkers. Scooters. Crutches. Turn on the lights when you go into a dark area. Replace any light bulbs as soon as they burn out. Set up your furniture so you have a clear path. Avoid moving your furniture around. If any of your floors are uneven, fix them. If there are any pets around you, be aware of where they are. Review your medicines with your doctor. Some medicines can make you feel dizzy. This can increase your chance of falling. Ask your doctor what other things that you can do to help prevent falls. This information is not intended to replace advice given to you by your health care provider. Make sure you discuss any questions you have with your health care provider. Document Released: 10/18/2008 Document Revised: 05/30/2015 Document Reviewed: 01/26/2014 Elsevier Interactive Patient Education  2017 [employer].

## 2021-08-22 ENCOUNTER — Ambulatory Visit (INDEPENDENT_AMBULATORY_CARE_PROVIDER_SITE_OTHER): Payer: Medicare Other | Admitting: *Deleted

## 2021-08-22 DIAGNOSIS — J309 Allergic rhinitis, unspecified: Secondary | ICD-10-CM | POA: Diagnosis not present

## 2021-08-29 ENCOUNTER — Ambulatory Visit (INDEPENDENT_AMBULATORY_CARE_PROVIDER_SITE_OTHER): Payer: Medicare Other | Admitting: *Deleted

## 2021-08-29 DIAGNOSIS — J309 Allergic rhinitis, unspecified: Secondary | ICD-10-CM

## 2021-08-30 DIAGNOSIS — Z803 Family history of malignant neoplasm of breast: Secondary | ICD-10-CM | POA: Diagnosis not present

## 2021-08-30 DIAGNOSIS — Z1231 Encounter for screening mammogram for malignant neoplasm of breast: Secondary | ICD-10-CM | POA: Diagnosis not present

## 2021-08-30 LAB — HM MAMMOGRAPHY

## 2021-09-05 ENCOUNTER — Ambulatory Visit (INDEPENDENT_AMBULATORY_CARE_PROVIDER_SITE_OTHER): Payer: Medicare Other | Admitting: *Deleted

## 2021-09-05 DIAGNOSIS — J309 Allergic rhinitis, unspecified: Secondary | ICD-10-CM | POA: Diagnosis not present

## 2021-09-06 DIAGNOSIS — G35 Multiple sclerosis: Secondary | ICD-10-CM | POA: Diagnosis not present

## 2021-09-12 DIAGNOSIS — J342 Deviated nasal septum: Secondary | ICD-10-CM | POA: Diagnosis not present

## 2021-09-12 DIAGNOSIS — J310 Chronic rhinitis: Secondary | ICD-10-CM | POA: Diagnosis not present

## 2021-09-12 DIAGNOSIS — J343 Hypertrophy of nasal turbinates: Secondary | ICD-10-CM | POA: Diagnosis not present

## 2021-09-24 DIAGNOSIS — G35 Multiple sclerosis: Secondary | ICD-10-CM | POA: Diagnosis not present

## 2021-09-29 ENCOUNTER — Encounter: Payer: Self-pay | Admitting: *Deleted

## 2021-10-02 ENCOUNTER — Telehealth: Payer: Self-pay | Admitting: Family Medicine

## 2021-10-02 NOTE — Telephone Encounter (Addendum)
Caller States: -PVD results from [facility]: -- Left leg was 0.60 -- Right leg was 0.74 --- Both show mild peripheral vascular disease. -Further testing can happen. -PT already on a statin.

## 2021-10-03 ENCOUNTER — Other Ambulatory Visit: Payer: Self-pay

## 2021-10-03 ENCOUNTER — Telehealth: Payer: Self-pay | Admitting: Family Medicine

## 2021-10-03 DIAGNOSIS — G35 Multiple sclerosis: Secondary | ICD-10-CM

## 2021-10-03 NOTE — Telephone Encounter (Signed)
Patient states her current Neurologist is retiring and Patient tried to schedule an appointment with Neurologist listed below but they require a Referral.  Patient requests a Referral be sent to:  Dr. Ala Bent at [facility], Ph# [phone], Fax# [phone]  Patient declined office visit.

## 2021-10-03 NOTE — Telephone Encounter (Signed)
Referral placed with dx. MS

## 2021-10-06 DIAGNOSIS — G35 Multiple sclerosis: Secondary | ICD-10-CM | POA: Diagnosis not present

## 2021-10-07 ENCOUNTER — Ambulatory Visit (INDEPENDENT_AMBULATORY_CARE_PROVIDER_SITE_OTHER): Payer: Medicare Other

## 2021-10-07 DIAGNOSIS — J309 Allergic rhinitis, unspecified: Secondary | ICD-10-CM | POA: Diagnosis not present

## 2021-10-09 DIAGNOSIS — M5416 Radiculopathy, lumbar region: Secondary | ICD-10-CM | POA: Diagnosis not present

## 2021-10-09 DIAGNOSIS — M4030 Flatback syndrome, site unspecified: Secondary | ICD-10-CM | POA: Diagnosis not present

## 2021-10-30 ENCOUNTER — Encounter: Payer: Self-pay | Admitting: Family Medicine

## 2021-10-30 ENCOUNTER — Ambulatory Visit (INDEPENDENT_AMBULATORY_CARE_PROVIDER_SITE_OTHER): Payer: Medicare Other | Admitting: Family Medicine

## 2021-10-30 VITALS — BP 139/81 | HR 69 | Temp 98.4°F | Ht 64.0 in | Wt 159.4 lb

## 2021-10-30 DIAGNOSIS — I10 Essential (primary) hypertension: Secondary | ICD-10-CM

## 2021-10-30 DIAGNOSIS — G35 Multiple sclerosis: Secondary | ICD-10-CM

## 2021-10-30 DIAGNOSIS — M1990 Unspecified osteoarthritis, unspecified site: Secondary | ICD-10-CM

## 2021-10-30 MED ORDER — CELECOXIB 200 MG PO CAPS: 200.0000 mg | ORAL_CAPSULE | Freq: Two times a day (BID) | ORAL | 5 refills | Status: DC

## 2021-10-30 NOTE — Assessment & Plan Note (Signed)
Symptoms are overall stable.  Her neurologist has retired and she is looking for a new neurologist.

## 2021-10-30 NOTE — Patient Instructions (Addendum)
It was very nice to see you today!  You have osteoarthritis. Please try taking the celebrex.   Please let us know if not improving and we can have you see the sports medicine doctor.   Take care, Dr Jerline Pain  PLEASE NOTE:  If you had any lab tests please let us know if you have not heard back within a few days. You may see your results on mychart before we have a chance to review them but we will give you a call once they are reviewed by [place]. If we ordered any referrals today, please let us know if you have not heard from their office within the next week.   Please try these tips to maintain a healthy lifestyle:  Eat at least 3 REAL meals and 1-2 snacks per day.  Aim for no more than 5 hours between eating.  If you eat breakfast, please do so within one hour of getting up.   Each meal should contain half fruits/vegetables, one quarter protein, and one quarter carbs (no bigger than a computer mouse)  Cut down on sweet beverages. This includes juice, soda, and sweet tea.   Drink at least 1 glass of water with each meal and aim for at least 8 glasses per day  Exercise at least 150 minutes every week.

## 2021-10-30 NOTE — Progress Notes (Signed)
   Emily Wolfe is a 66 y.o. female who presents today for an office visit.  Assessment/Plan:  Chronic Problems Addressed Today: Osteoarthritis Patient's hand and shoulder pain likely secondary to osteoarthritis.  She may have some underlying rotator cuff tendinopathy in her shoulders as well.  We will start Celebrex 200 mg twice daily as needed.  She will let me know if not improving in a few weeks and we can refer to sports medicine.  Essential hypertension At goal today off meds.  MS (multiple sclerosis) (HCC) Symptoms are overall stable.  Her neurologist has retired and she is looking for a new neurologist.     Subjective:  HPI:  See A/p for status of chronic conditions.   Her main concern today is joint pain.  Predominately located in bilateral shoulders and hands.  Shoulder pain is worse in right shoulder than left.  This has been going on for several years but seems to be worsening recently.  Feels like a constant ache.  No obvious injuries or precipitating events.  She is taking over-the-counter Tylenol ibuprofen and Aleve with modest improvement.  She has noticed some difficulty with closing her fist fully.  This does cause pain.  She has some stiffness.  Occasionally some popping in all of her joints.        Objective:  Physical Exam: BP 139/81   Pulse 69   Temp 98.4 F (36.9 C) (Temporal)   Ht '5\' 4"'$  (1.626 m)   Wt 159 lb 6.4 oz (72.3 kg)   SpO2 97%   BMI 27.36 kg/m   Gen: No acute distress, resting comfortably MSK: - Hands: Bilateral hands with joint hypertrophy.  Normal grip strength though does have decreased range of motion with flexion of digits. - Shoulders, no deformities.  Full range of motion throughout.  Normal supraspinatus testing.  Slightly decreased range of motion. Neer and Hawkins test negative. Neuro: Grossly normal, moves all extremities Psych: Normal affect and thought content      Keishla Oyer M. Jerline Pain, MD 10/30/2021 1:56 PM

## 2021-10-30 NOTE — Assessment & Plan Note (Signed)
At goal today off meds. 

## 2021-10-30 NOTE — Assessment & Plan Note (Signed)
Patient's hand and shoulder pain likely secondary to osteoarthritis.  She may have some underlying rotator cuff tendinopathy in her shoulders as well.  We will start Celebrex 200 mg twice daily as needed.  She will let me know if not improving in a few weeks and we can refer to sports medicine.

## 2021-10-31 ENCOUNTER — Ambulatory Visit (INDEPENDENT_AMBULATORY_CARE_PROVIDER_SITE_OTHER): Payer: Medicare Other

## 2021-10-31 DIAGNOSIS — J309 Allergic rhinitis, unspecified: Secondary | ICD-10-CM | POA: Diagnosis not present

## 2021-11-05 DIAGNOSIS — J302 Other seasonal allergic rhinitis: Secondary | ICD-10-CM | POA: Diagnosis not present

## 2021-11-05 NOTE — Progress Notes (Signed)
VIALS EXP 11-06-22

## 2021-11-06 DIAGNOSIS — G35 Multiple sclerosis: Secondary | ICD-10-CM | POA: Diagnosis not present

## 2021-11-06 DIAGNOSIS — J302 Other seasonal allergic rhinitis: Secondary | ICD-10-CM | POA: Diagnosis not present

## 2021-11-13 DIAGNOSIS — J309 Allergic rhinitis, unspecified: Secondary | ICD-10-CM | POA: Diagnosis not present

## 2021-11-13 DIAGNOSIS — J3489 Other specified disorders of nose and nasal sinuses: Secondary | ICD-10-CM | POA: Diagnosis not present

## 2021-11-13 DIAGNOSIS — M26629 Arthralgia of temporomandibular joint, unspecified side: Secondary | ICD-10-CM | POA: Diagnosis not present

## 2021-11-14 NOTE — Progress Notes (Signed)
I, Philbert Riser, LAT, ATC acting as a scribe for Clementeen Graham, MD.  Emily Wolfe is a 66 y.o. female who presents to [facility] today for bilateral shoulder pain. Pt has a hx of MS. Pt was previously by Dr. 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 Today, pt presents w/ bilat shoulder pain ongoing since Jan 2023, but has been ignoring it. Pt locates pain to all over both shoulder joints.   She also has chronic low back pain.  She had back surgery to help her right lumbar radiculopathy.  That symptom improved but her back is still painful.  She never did have core strengthening physical therapy following her back surgery.  She is doing Pilates which should help her core strength but she still has back pain.  Radiates: no Mechanical symptoms: yes UE Numbness/tingling: no UE Weakness: no Aggravates: use Treatments tried: Celebrex,   Pertinent review of systems: No fevers or chills  Relevant historical information: MS   Exam:  BP (!) 152/92   Pulse 63   Ht 5\' 4"  (1.626 m)   Wt 162 lb (73.5 kg)   SpO2 98%   BMI 27.81 kg/m  General: Well Developed, well nourished, and in no acute distress.   MSK: Right shoulder: Normal-appearing Normal motion pain with abduction beyond 110 degrees.. Strength 4/5 abduction 5/5 external and internal rotation. Positive Neer's test.  Negative Hawkins and empty can test. Negative Yergason's and speeds test.  Left shoulder: Normal-appearing. Normal motion pain with abduction beyond 110 degrees. Strength 4/5 duction.  5/5 external and internal rotation. Positive Neer's test.  Negative Hawkins and empty can test. Negative Yergason's and speeds test.  L-spine: Decreased lumbar motion.  Pain with extension.  Lab and Radiology Results  Diagnostic Limited MSK Ultrasound of: Right shoulder Biceps tendon is intact.  Hypoechoic fluid tracks near the biceps  tendon. Subscapularis tendon is intact with some hypoechoic fluid near her superficial to the subscapularis tendon. Supraspinatus tendon is intact appearing Mild subacromial bursitis is present. Infraspinatus tendon is intact. AC joint is degenerative appearing with effusion. Impression: Subdeltoid bursitis and AC DJD.  Mild subacromial bursitis.   Diagnostic Limited MSK Ultrasound of: Left shoulder: Biceps tendon is intact.  Hypoechoic fluid surrounds tendon inside tendon sheath. Subscapularis tendon is intact with some hypoechoic fluid tracking superficial to subscapularis tendon. Supraspinatus tendon is intact with mild subacromial bursitis present. Infraspinatus tendon is intact. AC joint degenerative with effusion. Impression: Subdeltoid and subacromial bursitis.  AC DJD.  X-ray images bilateral shoulders obtained today personally and independently interpreted  Right shoulder: Mild glenohumeral DJD.  Moderate AC DJD.  No acute fractures are visible.  Left shoulder: Mild glenohumeral and moderate AC DJD.  No acute fractures are visible.  Await formal radiology review  Assessment and Plan: 66 y.o. female with  Bilateral shoulder pain thought to be related to impingement and bursitis.  She may have a component of tendinitis as well of the rotator cuff tendons. Plan for trial of physical therapy.  We will check back in 6 weeks.  If not improving consider injection and/or MRI.  Additionally she has some chronic remaining low back pain following her lumbar surgery earlier this year.  Core strengthening trial of physical therapy would be reasonable as well.  She is already doing Pilates so I think more focused on home exercise program and for this would be helpful.   PDMP not reviewed this encounter. Orders  Placed This Encounter  Procedures   [place] LIMITED JOINT SPACE STRUCTURES UP BILAT(NO LINKED CHARGES)    Order Specific Question:   Reason for Exam (SYMPTOM  OR DIAGNOSIS REQUIRED)     Answer:   bilateral shoulder pain    Order Specific Question:   Preferred imaging location?    Answer:   [facility] Shoulder Left    Standing Status:   Future    Number of Occurrences:   1    Standing Expiration Date:   12/17/2021    Order Specific Question:   Reason for Exam (SYMPTOM  OR DIAGNOSIS REQUIRED)    Answer:   bilateral shoulder pain    Order Specific Question:   Preferred imaging location?    Answer:   [facility] Shoulder Right    Standing Status:   Future    Number of Occurrences:   1    Standing Expiration Date:   11/18/2022    Order Specific Question:   Reason for Exam (SYMPTOM  OR DIAGNOSIS REQUIRED)    Answer:   bilateral shoulder pain    Order Specific Question:   Preferred imaging location?    Answer:   [facility]   Ambulatory referral to Physical Therapy    Referral Priority:   Routine    Referral Type:   Physical Medicine    Referral Reason:   Specialty Services Required    Requested Specialty:   Physical Therapy    Number of Visits Requested:   1   No orders of the defined types were placed in this encounter.    Discussed warning signs or symptoms. Please see discharge instructions. Patient expresses understanding.   The above documentation has been reviewed and is accurate and complete Clementeen Graham, M.D.

## 2021-11-17 ENCOUNTER — Ambulatory Visit: Payer: Medicare Other | Admitting: Family Medicine

## 2021-11-17 ENCOUNTER — Ambulatory Visit (INDEPENDENT_AMBULATORY_CARE_PROVIDER_SITE_OTHER): Payer: Medicare Other

## 2021-11-17 ENCOUNTER — Ambulatory Visit: Payer: Self-pay

## 2021-11-17 VITALS — BP 152/92 | HR 63 | Ht 64.0 in | Wt 162.0 lb

## 2021-11-17 DIAGNOSIS — M25511 Pain in right shoulder: Secondary | ICD-10-CM

## 2021-11-17 DIAGNOSIS — M19012 Primary osteoarthritis, left shoulder: Secondary | ICD-10-CM | POA: Diagnosis not present

## 2021-11-17 DIAGNOSIS — M25512 Pain in left shoulder: Secondary | ICD-10-CM

## 2021-11-17 DIAGNOSIS — G8929 Other chronic pain: Secondary | ICD-10-CM | POA: Diagnosis not present

## 2021-11-17 DIAGNOSIS — M545 Low back pain, unspecified: Secondary | ICD-10-CM

## 2021-11-17 DIAGNOSIS — M19011 Primary osteoarthritis, right shoulder: Secondary | ICD-10-CM | POA: Diagnosis not present

## 2021-11-17 NOTE — Patient Instructions (Addendum)
Thank you for coming in today.   Please get an Xray today before you leave   I've referred you to Physical Therapy.  Let us know if you don't hear from them in one week.   Recheck in about 6 weeks or so.   Let me know sooner if you have a problem.

## 2021-11-18 DIAGNOSIS — J309 Allergic rhinitis, unspecified: Secondary | ICD-10-CM | POA: Diagnosis not present

## 2021-11-18 DIAGNOSIS — M26629 Arthralgia of temporomandibular joint, unspecified side: Secondary | ICD-10-CM | POA: Diagnosis not present

## 2021-11-18 DIAGNOSIS — J329 Chronic sinusitis, unspecified: Secondary | ICD-10-CM | POA: Diagnosis not present

## 2021-11-18 DIAGNOSIS — J3489 Other specified disorders of nose and nasal sinuses: Secondary | ICD-10-CM | POA: Diagnosis not present

## 2021-11-18 NOTE — Progress Notes (Signed)
Right shoulder x-ray shows arthritis changes.  There is some evidence of calcific tendinitis as well.

## 2021-11-18 NOTE — Progress Notes (Signed)
Left shoulder x-ray shows arthritis changes in the shoulder.

## 2021-12-01 ENCOUNTER — Encounter: Payer: Self-pay | Admitting: Physical Therapy

## 2021-12-01 ENCOUNTER — Ambulatory Visit: Payer: Medicare Other | Admitting: Physical Therapy

## 2021-12-01 DIAGNOSIS — M25511 Pain in right shoulder: Secondary | ICD-10-CM

## 2021-12-01 DIAGNOSIS — G8929 Other chronic pain: Secondary | ICD-10-CM

## 2021-12-01 DIAGNOSIS — M25512 Pain in left shoulder: Secondary | ICD-10-CM | POA: Diagnosis not present

## 2021-12-01 DIAGNOSIS — M6281 Muscle weakness (generalized): Secondary | ICD-10-CM | POA: Diagnosis not present

## 2021-12-01 DIAGNOSIS — M5459 Other low back pain: Secondary | ICD-10-CM | POA: Diagnosis not present

## 2021-12-01 NOTE — Therapy (Unsigned)
OUTPATIENT PHYSICAL THERAPY LBP and SHOULDER EVALUATION   Patient Name: Emily Wolfe MRN: [id number] DOB:21-Sep-1955, 66 y.o., female Today's Date: 12/02/2021  END OF SESSION:  PT End of Session - 12/01/21 1348     Visit Number 1    Number of Visits 16    Date for PT Re-Evaluation 01/26/22    Authorization Type [employer]- VL med necc    PT Start Time 2440    PT Stop Time 1027    PT Time Calculation (min) 40 min             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   History of ovarian cancer 11/19/2017   History of stomach ulcers    Hyperlipidemia 06/29/2017   Hypertension 06/29/2017   Infection 10/2007   From hysterectomy sutures rupturing   Inguinal hernia    Joint pain    Lactose intolerance    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   Mild intermittent asthma without complication 25/36/6440   Multiple sclerosis ([place]) 06/29/2017   Neuromuscular disorder ([place])    Osteoarthritis    Palpitations    Postmenopausal 06/29/2017   Scoliosis of lumbar spine 06/29/2017   Small bowel obstruction ([place]) 06/29/2017   Swallowing difficulty    Umbilical hernia    Urticaria    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   Vitamin D deficiency 06/29/2017   Past Surgical History:  Procedure Laterality Date   ADENOIDECTOMY     CHOLECYSTECTOMY  2009   HERNIA REPAIR     HERNIA REPAIR     6/10, 9/10   HYSTERECTOMY ABDOMINAL WITH SALPINGO-OOPHORECTOMY     HYSTEROTOMY  2008   LAPAROSCOPIC LYSIS OF ADHESIONS     , With small bowel resection and ventral hernia repair with mesh 2019/[place]   MOHS SURGERY     SINOSCOPY     VARICOSE VEIN SURGERY     Patient  Active Problem List   Diagnosis Date Noted   S/P lumbar fusion 05/19/2021   Primary osteoarthritis of right hip 12/27/2020   Osteoarthritis 11/22/2020   Dehydration 08/31/2020   Class 1 obesity with serious comorbidity and body mass index (BMI) of 30.0 to 30.9 in adult 01/26/2020   SBO (small bowel obstruction) ([place]) 03/21/2019   Nightmares 03/07/2019   Anxiety 12/20/2018   Cervicalgia 08/18/2018   Cough variant asthma vs UACS 01/03/2018   History of total hysterectomy, due to ovarian cancer 12/26/2017   Genital warts 11/19/2017   History of ovarian cancer 11/19/2017   Seasonal and perennial allergic rhinitis, followed by Allergist, treated with saline rinses and allergy shots 11/02/2017   Mild intermittent asthma without complication 34/74/2595   Hyperlipidemia 06/29/2017   Essential hypertension 06/29/2017   MS (multiple sclerosis) ([place]) 06/29/2017   Scoliosis of lumbar spine 06/29/2017   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   Vitamin D deficiency 06/29/2017   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   Lung mass 06/29/2017   Colon abnormality 05/11/2017    PCP: Vivi Barrack, MD  REFERRING PROVIDER: Gregor Hams, MD  REFERRING  DIAG: M25.511,G89.29,M25.512 (ICD-10-CM) - Chronic pain of both shoulders M54.50,G89.29 (ICD-10-CM) - Chronic bilateral low back pain without sciatica  THERAPY DIAG:  Chronic pain of both shoulders - Plan: PT plan of care cert/re-cert  Other low back pain - Plan: PT plan of care cert/re-cert  Muscle weakness (generalized) - Plan: PT plan of care cert/re-cert  Rationale for Evaluation and Treatment: Rehabilitation  ONSET DATE: earlier this year for back pain, and 2 years ago for shoulders.   SUBJECTIVE:                                                                                                                                                                                      SUBJECTIVE  STATEMENT: States that she has had pain in her back for a while and was having right leg pain and then decided to have lumbar fusion L3/4. States that she had rehab for her right hip and that set in motion for her back pain. States she hasn't done much for her back since the surgery but she does a pilates class 1x/week and end of this month she will do 2x/week at [place]. States she has gained 30 pounds since the surgery. States that she has difficulties standing upright after her hip rehab earlier this year.   Reports she has baseline arm pain due to her MS but this shoulder pain is different, this pain is in the front and top of her shoulders when reaching up overhead  PERTINENT HISTORY: MS, lumbar spinal fusion L3/4 05/19/21, hx of ovarian cancer and 8 gut surgeries. Hernias, foot drop on right (when fatigued), fainting spells  PAIN:  Are you having pain? Yes: NPRS scale: 1/10 Pain location: LBP and B shoulder Pain description: lower back burn and shoulders ache Aggravating factors: rolling on shoulders Relieving factors: nothing  PRECAUTIONS: None  WEIGHT BEARING RESTRICTIONS: No  FALLS:  Has patient fallen in last 6 months? 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   NEXT MD VISIT:   OBJECTIVE:   DIAGNOSTIC FINDINGS:  right shoulder xray IMPRESSION: Diffuse osteopenia. Acromioclavicular and glenohumeral degenerative change. Subacromial spurring. A calcific density is noted over the proximal right humerus, this could be related to calcific tendinosis or a loose body. No acute bony abnormality identified.  Left shoulder xray IMPRESSION: Diffuse osteopenia. Acromioclavicular and glenohumeral degenerative change. Mild subacromial spurring. No acute abnormality identified.   COGNITION: Overall cognitive  status: Within functional limits for tasks assessed  POSTURE: Slumped forward postured, limited hip/lumbar extension with walking. Forward head, rounded shoulders, sacral sitting  ROM Neck motion limited in all directions but no pain with motion    UE Measurements Upper Extremity Right 12/02/2021 Left 12/02/2021   A/PROM MMT A/PROM MMT  Shoulder Flexion 160** 4- 145 3+  Shoulder Extension      Shoulder Abduction WFL - scaption  WFL - Scaption   Shoulder Adduction      Shoulder Internal Rotation Reaches to L3  SP* 3+  Reaches to L1 SP* 3+  Shoulder External Rotation Reaches to C7 SP 3+ Reaches to C7 SP 3+  Elbow Flexion      Elbow Extension      Wrist Flexion      Wrist Extension      Wrist Supination      Wrist Pronation      Wrist Ulnar Deviation      Wrist Radial Deviation      Grip Strength NA  NA     (Blank rows = not tested)   * pain/tightness **  difficult to perform movement Pt is right handed  Transitional Movements: STS - Heavy uses of B UE to transition and labored effort   PALPATION:  Tenderness to palpation along anterior shoulder, biceps and deltoid R>L   TODAY'S TREATMENT:                                                                                                                                         DATE:  12/01/21 Therapeutic Exercise:  Aerobic: Supine: Prone:  Seated:  Standing: Neuromuscular Re-education:posture and elevating seat Manual Therapy: Therapeutic Activity: Self Care: Trigger Point Dry Needling:  Modalities:    PATIENT EDUCATION:  Education details: on current presentation, on HEP, on clinical outcomes score and POC Person educated: Patient Education method: Consulting civil engineer, Demonstration, and Handouts Education comprehension: verbalized understanding   HOME EXERCISE PROGRAM: None at this time - focus on posture and improving seated postures  ASSESSMENT:  CLINICAL IMPRESSION: Patient presents to physical  therapy with complaints of bilateral shoulder pain and low back pain. Patient with history of lumbar surgery earlier this year which relieved her leg pain but not her back pain. Since then her shoulders have really started to bother her. Patient with history of 10+ abdominal surgeries that is likely contributing to current condition and posture. Patient would greatly benefit from skilled PT to improve overall function and QOL.   OBJECTIVE IMPAIRMENTS: decreased activity tolerance, decreased mobility, difficulty walking, decreased ROM, decreased strength, impaired UE functional use, improper body mechanics, postural dysfunction, and pain.   ACTIVITY LIMITATIONS: carrying, lifting, sitting, standing, transfers, reach over head, and locomotion level  PARTICIPATION LIMITATIONS: community activity, occupation, and yard work  PERSONAL FACTORS: Age, Fitness, Past/current experiences, and 3+ comorbidities: multiple abdominal surgeries, lumbar fusion L3/4  are also affecting patient's functional outcome.   REHAB POTENTIAL: Good  CLINICAL DECISION MAKING: Evolving/moderate complexity  EVALUATION COMPLEXITY: Moderate   GOALS: Goals reviewed with patient? yes  SHORT TERM GOALS: Target date: 12/30/2021  Patient will be independent in self management strategies to improve quality of life and functional outcomes. Baseline: New Program Goal status: INITIAL  2.  Patient will report at least 25% improvement in overall symptoms and/or function to demonstrate improved functional mobility Baseline: 0% better Goal status: INITIAL  3.  Patient will reporting modifying heights of regularly used chairs at home with cushions to improve posture while sitting at home  Baseline:  Goal status: INITIAL      LONG TERM GOALS: Target date: 01/26/2022    Patient will report at least 50% improvement in overall symptoms and/or function to demonstrate improved functional mobility Baseline: 0% better Goal status:  INITIAL  2.  Patient will be able to demonstrate at least 160 degrees of B shoulder ROM to improve overhead reaching Baseline:  Goal status: INITIAL  3.  Patient will be able to sit in active sitting posture for a least 2 minutes to demonstrate improved postural strength. Baseline:  Goal status: INITIAL   PLAN:  PT FREQUENCY: 2x/week  PT DURATION: 8 weeks  PLANNED INTERVENTIONS: Therapeutic exercises, Therapeutic activity, Neuromuscular re-education, Balance training, Gait training, Patient/Family education, Self Care, Joint mobilization, Joint manipulation, DME instructions, Dry Needling, Electrical stimulation, Cryotherapy, Moist heat, Ionotophoresis '4mg'$ /ml Dexamethasone, Manual therapy, and Re-evaluation  PLAN FOR NEXT SESSION:    posture, pelvic tilts, ROM, prone lying with pillows   10:02 AM, 12/02/21 Jerene Pitch, DPT Physical Therapy with [facility]

## 2021-12-06 DIAGNOSIS — G35 Multiple sclerosis: Secondary | ICD-10-CM | POA: Diagnosis not present

## 2021-12-08 ENCOUNTER — Encounter: Payer: Self-pay | Admitting: Physical Therapy

## 2021-12-08 ENCOUNTER — Ambulatory Visit: Payer: Medicare Other | Admitting: Physical Therapy

## 2021-12-08 DIAGNOSIS — M6281 Muscle weakness (generalized): Secondary | ICD-10-CM | POA: Diagnosis not present

## 2021-12-08 DIAGNOSIS — M25511 Pain in right shoulder: Secondary | ICD-10-CM

## 2021-12-08 DIAGNOSIS — G8929 Other chronic pain: Secondary | ICD-10-CM | POA: Diagnosis not present

## 2021-12-08 DIAGNOSIS — M25512 Pain in left shoulder: Secondary | ICD-10-CM

## 2021-12-08 DIAGNOSIS — M5459 Other low back pain: Secondary | ICD-10-CM | POA: Diagnosis not present

## 2021-12-08 NOTE — Therapy (Signed)
OUTPATIENT PHYSICAL THERAPY TREATMENT NOTE   Patient Name: Emily Wolfe MRN: [id number] DOB:Mar 01, 1955, 66 y.o., female Today's Date: 12/08/2021   PCP: Vivi Barrack, MD   REFERRING PROVIDER: Gregor Hams, MD  END OF SESSION:   PT End of Session - 12/08/21 1308     Visit Number 2    Number of Visits 16    Date for PT Re-Evaluation 01/26/22    Authorization Type U[employer]- VL med necc    PT Start Time 43    PT Stop Time 3710    PT Time Calculation (min) 40 min             Past Medical History:  Diagnosis Date   Anemia    Angio-edema    Back pain    Bilateral swelling of feet    Cancer ([place])    Chronic pain of both upper extremities    Eczema    Gallbladder problem    Genital warts 11/19/2017   GERD (gastroesophageal reflux disease)    Heartburn    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   History of ovarian cancer 11/19/2017   History of stomach ulcers    Hyperlipidemia 06/29/2017   Hypertension 06/29/2017   Infection 10/2007   From hysterectomy sutures rupturing   Inguinal hernia    Joint pain    Lactose intolerance    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   Mild intermittent asthma without complication 62/69/4854   Multiple sclerosis ([place]) 06/29/2017   Neuromuscular disorder ([place])    Osteoarthritis    Palpitations    Postmenopausal 06/29/2017   Scoliosis of lumbar spine 06/29/2017   Small bowel obstruction ([place]) 06/29/2017   Swallowing difficulty    Umbilical hernia    Urticaria    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   Vitamin D deficiency 06/29/2017   Past Surgical History:  Procedure Laterality Date   ADENOIDECTOMY     CHOLECYSTECTOMY  2009   HERNIA REPAIR     HERNIA REPAIR     6/10, 9/10   HYSTERECTOMY ABDOMINAL WITH SALPINGO-OOPHORECTOMY     HYSTEROTOMY  2008   LAPAROSCOPIC LYSIS OF ADHESIONS     , With small bowel resection and ventral hernia repair with mesh 2019/[place]   MOHS  SURGERY     SINOSCOPY     VARICOSE VEIN SURGERY     Patient Active Problem List   Diagnosis Date Noted   S/P lumbar fusion 05/19/2021   Primary osteoarthritis of right hip 12/27/2020   Osteoarthritis 11/22/2020   Dehydration 08/31/2020   Class 1 obesity with serious comorbidity and body mass index (BMI) of 30.0 to 30.9 in adult 01/26/2020   SBO (small bowel obstruction) ([place]) 03/21/2019   Nightmares 03/07/2019   Anxiety 12/20/2018   Cervicalgia 08/18/2018   Cough variant asthma vs UACS 01/03/2018   History of total hysterectomy, due to ovarian cancer 12/26/2017   Genital warts 11/19/2017   History of ovarian cancer 11/19/2017   Seasonal and perennial allergic rhinitis, followed by Allergist, treated with saline rinses and allergy shots 11/02/2017   Mild intermittent asthma without complication 62/70/3500   Hyperlipidemia 06/29/2017   Essential hypertension 06/29/2017   MS (multiple sclerosis) ([place]) 06/29/2017   Scoliosis of lumbar spine 06/29/2017   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   Vitamin D deficiency 06/29/2017   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   Lung mass 06/29/2017   Colon abnormality 05/11/2017  THERAPY DIAG:  Chronic pain of both shoulders  Other low back pain  Muscle weakness (generalized)    REFERRING DIAG: M25.511,G89.29,M25.512 (ICD-10-CM) - Chronic pain of both shoulders M54.50,G89.29 (ICD-10-CM) - Chronic bilateral low back pain without sciatica     Rationale for Evaluation and Treatment: Rehabilitation   ONSET DATE: earlier this year for back pain, and 2 years ago for shoulders.    SUBJECTIVE:                                                                                                                                                                                       SUBJECTIVE STATEMENT: 12/08/2021  States that she has been busy trying  to clean and it has been a lot. States that she is really  achy today especially in her back. States she got her cushion last night and put it in the chair she uses the most.   Eval: States that she has had pain in her back for a while and was having right leg pain and then decided to have lumbar fusion L3/4. States that she had rehab for her right hip and that set in motion for her back pain. States she hasn't done much for her back since the surgery but she does a pilates class 1x/week and end of this month she will do 2x/week at [place]. States she has gained 30 pounds since the surgery. States that she has difficulties standing upright after her hip rehab earlier this year.    Reports she has baseline arm pain due to her MS but this shoulder pain is different, this pain is in the front and top of her shoulders when reaching up overhead   PERTINENT HISTORY: MS, lumbar spinal fusion L3/4 05/19/21, hx of ovarian cancer and 8 gut surgeries. Hernias, foot drop on right (when fatigued), fainting spells   PAIN:  Are you having pain? Yes: NPRS scale: 2/10 Pain location: LBP and B shoulder Pain description: lower back burn and shoulders ache Aggravating factors: rolling on shoulders Relieving factors: nothing   PRECAUTIONS: None   WEIGHT BEARING RESTRICTIONS: No   FALLS:  Has patient fallen in last 6 months? 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    NEXT MD VISIT:    OBJECTIVE:    DIAGNOSTIC FINDINGS:  right shoulder xray IMPRESSION: Diffuse osteopenia. Acromioclavicular and glenohumeral degenerative change. Subacromial spurring. A calcific density is noted over the proximal right humerus,  this could be related to calcific tendinosis or a loose body. No acute bony abnormality identified.   Left shoulder xray IMPRESSION: Diffuse osteopenia. Acromioclavicular and  glenohumeral degenerative change. Mild subacromial spurring. No acute abnormality identified.     COGNITION: Overall cognitive status: Within functional limits for tasks assessed                                      POSTURE: Slumped forward postured, limited hip/lumbar extension with walking. Forward head, rounded shoulders, sacral sitting   ROM Neck motion limited in all directions but no pain with motion                        UE Measurements       Upper Extremity Right 12/02/2021 Left 12/02/2021    A/PROM MMT A/PROM MMT  Shoulder Flexion 160** 4- 145 3+  Shoulder Extension          Shoulder Abduction WFL - scaption   WFL - Scaption    Shoulder Adduction          Shoulder Internal Rotation Reaches to L3  SP* 3+  Reaches to L1 SP* 3+  Shoulder External Rotation Reaches to C7 SP 3+ Reaches to C7 SP 3+  Elbow Flexion          Elbow Extension          Wrist Flexion          Wrist Extension          Wrist Supination          Wrist Pronation          Wrist Ulnar Deviation          Wrist Radial Deviation          Grip Strength NA   NA                          (Blank rows = not tested)                       * pain/tightness **  difficult to perform movement Pt is right handed   Transitional Movements: STS - Heavy uses of B UE to transition and labored effort     PALPATION:  Tenderness to palpation along anterior shoulder, biceps and deltoid R>L             TODAY'S TREATMENT:                                                                                                                                         DATE:  12/08/2021  Therapeutic Exercise:    Aerobic: Supine: serratus plus 5x5 5" hlds B, heel slides x15 B Prone:  Seated: shoulder ER x30 B,     Standing: Neuromuscular Re-education:belly breathing - 15 minutes - tactile and verbal support Manual Therapy: percussion gun - IASTM - to left shoulder - with padding to left shoulder 15 minutes  Therapeutic  Activity: Self Care: Trigger Point Dry Needling:  Modalities:      PATIENT EDUCATION:  Education details: on HEP, rationale for current interventions, posture Person educated: Patient Education method: Consulting civil engineer, Demonstration, and Handouts Education comprehension: verbalized understanding     HOME EXERCISE PROGRAM: None at this time - focus on posture and improving seated postures   ASSESSMENT:   CLINICAL IMPRESSION: 12/08/2021 Focused on continued assessment and education. Scar tissue noted throughout abdomen limiting ability to lay flight. Educated patient in current presentation and importance of gradual supine laying. Tolerated exercises moderately well. Will continue with current POC as tolerated.  Eval: Patient presents to physical therapy with complaints of bilateral shoulder pain and low back pain. Patient with history of lumbar surgery earlier this year which relieved her leg pain but not her back pain. Since then her shoulders have really started to bother her. Patient with history of 10+ abdominal surgeries that is likely contributing to current condition and posture. Patient would greatly benefit from skilled PT to improve overall function and QOL.    OBJECTIVE IMPAIRMENTS: decreased activity tolerance, decreased mobility, difficulty walking, decreased ROM, decreased strength, impaired UE functional use, improper body mechanics, postural dysfunction, and pain.    ACTIVITY LIMITATIONS: carrying, lifting, sitting, standing, transfers, reach over head, and locomotion level   PARTICIPATION LIMITATIONS: community activity, occupation, and yard work   PERSONAL FACTORS: Age, Fitness, Past/current experiences, and 3+ comorbidities: multiple abdominal surgeries, lumbar fusion L3/4  are also affecting patient's functional outcome.    REHAB POTENTIAL: Good   CLINICAL DECISION MAKING: Evolving/moderate complexity   EVALUATION COMPLEXITY: Moderate     GOALS: Goals reviewed  with patient? yes   SHORT TERM GOALS: Target date: 12/30/2021  Patient will be independent in self management strategies to improve quality of life and functional outcomes. Baseline: New Program Goal status: INITIAL   2.  Patient will report at least 25% improvement in overall symptoms and/or function to demonstrate improved functional mobility Baseline: 0% better Goal status: INITIAL   3.  Patient will reporting modifying heights of regularly used chairs at home with cushions to improve posture while sitting at home  Baseline:  Goal status: INITIAL           LONG TERM GOALS: Target date: 01/26/2022     Patient will report at least 50% improvement in overall symptoms and/or function to demonstrate improved functional mobility Baseline: 0% better Goal status: INITIAL   2.  Patient will be able to demonstrate at least 160 degrees of B shoulder ROM to improve overhead reaching Baseline:  Goal status: INITIAL   3.  Patient will be able to sit in active sitting posture for a least 2 minutes to demonstrate improved postural strength. Baseline:  Goal status: INITIAL     PLAN:   PT FREQUENCY: 2x/week   PT DURATION: 8 weeks   PLANNED INTERVENTIONS: Therapeutic exercises, Therapeutic activity, Neuromuscular re-education, Balance training, Gait training, Patient/Family education, Self Care, Joint mobilization, Joint manipulation, DME instructions, Dry Needling, Electrical stimulation, Cryotherapy, Moist heat, Ionotophoresis '4mg'$ /ml Dexamethasone, Manual therapy, and Re-evaluation   PLAN FOR NEXT SESSION:    posture, pelvic tilts, ROM, prone lying with pillows   2:11 PM, 12/08/21 Jerene Pitch, DPT Physical Therapy with [facility]

## 2021-12-12 ENCOUNTER — Ambulatory Visit (INDEPENDENT_AMBULATORY_CARE_PROVIDER_SITE_OTHER): Payer: Medicare Other | Admitting: *Deleted

## 2021-12-12 DIAGNOSIS — J309 Allergic rhinitis, unspecified: Secondary | ICD-10-CM | POA: Diagnosis not present

## 2021-12-16 ENCOUNTER — Encounter: Payer: Self-pay | Admitting: Physical Therapy

## 2021-12-16 ENCOUNTER — Ambulatory Visit: Payer: Medicare Other | Admitting: Physical Therapy

## 2021-12-16 DIAGNOSIS — M25511 Pain in right shoulder: Secondary | ICD-10-CM

## 2021-12-16 DIAGNOSIS — M25512 Pain in left shoulder: Secondary | ICD-10-CM | POA: Diagnosis not present

## 2021-12-16 DIAGNOSIS — M6281 Muscle weakness (generalized): Secondary | ICD-10-CM

## 2021-12-16 DIAGNOSIS — G8929 Other chronic pain: Secondary | ICD-10-CM | POA: Diagnosis not present

## 2021-12-16 DIAGNOSIS — M5459 Other low back pain: Secondary | ICD-10-CM | POA: Diagnosis not present

## 2021-12-16 NOTE — Therapy (Signed)
OUTPATIENT PHYSICAL THERAPY TREATMENT NOTE   Patient Name: Emily Wolfe MRN: [id number] DOB:11-23-1955, 66 y.o., female Today's Date: 12/16/2021   PCP: Vivi Barrack, MD   REFERRING PROVIDER: Gregor Hams, MD  END OF SESSION:   PT End of Session - 12/16/21 1302     Visit Number 3    Number of Visits 16    Date for PT Re-Evaluation 01/26/22    Authorization Type [employer]- VL med necc    PT Start Time 1287    PT Stop Time 1344    PT Time Calculation (min) 39 min             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   History of ovarian cancer 11/19/2017   History of stomach ulcers    Hyperlipidemia 06/29/2017   Hypertension 06/29/2017   Infection 10/2007   From hysterectomy sutures rupturing   Inguinal hernia    Joint pain    Lactose intolerance    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   Mild intermittent asthma without complication 86/76/7209   Multiple sclerosis ([place]) 06/29/2017   Neuromuscular disorder ([place])    Osteoarthritis    Palpitations    Postmenopausal 06/29/2017   Scoliosis of lumbar spine 06/29/2017   Small bowel obstruction ([place]) 06/29/2017   Swallowing difficulty    Umbilical hernia    Urticaria    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   Vitamin D deficiency 06/29/2017   Past Surgical History:  Procedure Laterality Date   ADENOIDECTOMY     CHOLECYSTECTOMY  2009   HERNIA REPAIR     HERNIA REPAIR     6/10, 9/10   HYSTERECTOMY ABDOMINAL WITH SALPINGO-OOPHORECTOMY     HYSTEROTOMY  2008   LAPAROSCOPIC LYSIS OF ADHESIONS     , With small bowel resection and ventral hernia repair with mesh 2019/[place]   MOHS  SURGERY     SINOSCOPY     VARICOSE VEIN SURGERY     Patient Active Problem List   Diagnosis Date Noted   S/P lumbar fusion 05/19/2021   Primary osteoarthritis of right hip 12/27/2020   Osteoarthritis 11/22/2020   Dehydration 08/31/2020   Class 1 obesity with serious comorbidity and body mass index (BMI) of 30.0 to 30.9 in adult 01/26/2020   SBO (small bowel obstruction) ([place]) 03/21/2019   Nightmares 03/07/2019   Anxiety 12/20/2018   Cervicalgia 08/18/2018   Cough variant asthma vs UACS 01/03/2018   History of total hysterectomy, due to ovarian cancer 12/26/2017   Genital warts 11/19/2017   History of ovarian cancer 11/19/2017   Seasonal and perennial allergic rhinitis, followed by Allergist, treated with saline rinses and allergy shots 11/02/2017   Mild intermittent asthma without complication 47/09/6281   Hyperlipidemia 06/29/2017   Essential hypertension 06/29/2017   MS (multiple sclerosis) ([place]) 06/29/2017   Scoliosis of lumbar spine 06/29/2017   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   Vitamin D deficiency 06/29/2017   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   Lung mass 06/29/2017   Colon abnormality 05/11/2017  THERAPY DIAG:  Chronic pain of both shoulders  Other low back pain  Muscle weakness (generalized)    REFERRING DIAG: M25.511,G89.29,M25.512 (ICD-10-CM) - Chronic pain of both shoulders M54.50,G89.29 (ICD-10-CM) - Chronic bilateral low back pain without sciatica     Rationale for Evaluation and Treatment: Rehabilitation   ONSET DATE: earlier this year for back pain, and 2 years ago for shoulders.    SUBJECTIVE:                                                                                                                                                                                       SUBJECTIVE STATEMENT: 12/16/2021 States she did pilates yesterday and it was difficult. States she feels stiff today in her lower  back but no pain otherwise.  Eval: States that she has had pain in her back for a while and was having right leg pain and then decided to have lumbar fusion L3/4. States that she had rehab for her right hip and that set in motion for her back pain. States she hasn't done much for her back since the surgery but she does a pilates class 1x/week and end of this month she will do 2x/week at [facility]. States she has gained 30 pounds since the surgery. States that she has difficulties standing upright after her hip rehab earlier this year.    Reports she has baseline arm pain due to her MS but this shoulder pain is different, this pain is in the front and top of her shoulders when reaching up overhead   PERTINENT HISTORY: MS, lumbar spinal fusion L3/4 05/19/21, hx of ovarian cancer and 8 gut surgeries. Hernias, foot drop on right (when fatigued), fainting spells   PAIN:  Are you having pain? Yes: NPRS scale: 2/10 Pain location: LBP  Pain description:stiffness      PRECAUTIONS: None   WEIGHT BEARING RESTRICTIONS: No   FALLS:  Has patient fallen in last 6 months? 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    NEXT MD VISIT:    OBJECTIVE:    DIAGNOSTIC FINDINGS:  right shoulder xray IMPRESSION: Diffuse osteopenia. Acromioclavicular and glenohumeral degenerative change. Subacromial spurring. A calcific density is noted over the proximal right humerus, this could be related to calcific tendinosis or a loose body. No acute bony abnormality identified.   Left shoulder xray IMPRESSION: Diffuse osteopenia. Acromioclavicular and glenohumeral degenerative change. Mild subacromial spurring. No acute abnormality identified.  COGNITION: Overall cognitive status: Within functional limits for tasks assessed                                       POSTURE: Slumped forward postured, limited hip/lumbar extension with walking. Forward head, rounded shoulders, sacral sitting   ROM Neck motion limited in all directions but no pain with motion                        UE Measurements       Upper Extremity Right 12/02/2021 Left 12/02/2021    A/PROM MMT A/PROM MMT  Shoulder Flexion 160** 4- 145 3+  Shoulder Extension          Shoulder Abduction WFL - scaption   WFL - Scaption    Shoulder Adduction          Shoulder Internal Rotation Reaches to L3  SP* 3+  Reaches to L1 SP* 3+  Shoulder External Rotation Reaches to C7 SP 3+ Reaches to C7 SP 3+  Elbow Flexion          Elbow Extension          Wrist Flexion          Wrist Extension          Wrist Supination          Wrist Pronation          Wrist Ulnar Deviation          Wrist Radial Deviation          Grip Strength NA   NA                          (Blank rows = not tested)                       * pain/tightness **  difficult to perform movement Pt is right handed   Transitional Movements: STS - Heavy uses of B UE to transition and labored effort     PALPATION:  Tenderness to palpation along anterior shoulder, biceps and deltoid R>L             TODAY'S TREATMENT:                                                                                                                                         DATE:  12/16/2021  Therapeutic Exercise:    Aerobic: Supine:  Prone:    Seated:     Standing: shoulder slides up wall with pelvis towards the wall 2x5 minutes minutes, facing wall hands straight marching 4 minutes, shoulder flexion with back up against the wall and pillow case to activate shoulder ER 2x20  B  Neuromuscular Re-education: Manual Therapy:  Therapeutic Activity: Self Care: Trigger Point Dry Needling:  Modalities:      PATIENT EDUCATION:  Education details: on HEP, on stepper vs walking, on importance of moving, how to move. On  current presentation, scar tissue and importance of focusing on herself instead of others. Person educated: Patient Education method: Explanation, Demonstration, and Handouts Education comprehension: verbalized understanding     HOME EXERCISE PROGRAM: [id number]  ASSESSMENT:   CLINICAL IMPRESSION: 12/16/2021 Session focused on standing exercises to try to improve HEP adherence. Focused on posture and anterior stretching and posterior strengthening. Discussed importance of daily stretching secondary to prolonged positioning and scar tissue. No pain noted during or after session. Instructed patient to perform standing posture exercise for 15 minutes a day. Will continue with current POC as tolerated.   Eval: Patient presents to physical therapy with complaints of bilateral shoulder pain and low back pain. Patient with history of lumbar surgery earlier this year which relieved her leg pain but not her back pain. Since then her shoulders have really started to bother her. Patient with history of 10+ abdominal surgeries that is likely contributing to current condition and posture. Patient would greatly benefit from skilled PT to improve overall function and QOL.    OBJECTIVE IMPAIRMENTS: decreased activity tolerance, decreased mobility, difficulty walking, decreased ROM, decreased strength, impaired UE functional use, improper body mechanics, postural dysfunction, and pain.    ACTIVITY LIMITATIONS: carrying, lifting, sitting, standing, transfers, reach over head, and locomotion level   PARTICIPATION LIMITATIONS: community activity, occupation, and yard work   PERSONAL FACTORS: Age, Fitness, Past/current experiences, and 3+ comorbidities: multiple abdominal surgeries, lumbar fusion L3/4  are also affecting patient's functional outcome.    REHAB POTENTIAL: Good   CLINICAL DECISION MAKING: Evolving/moderate complexity   EVALUATION COMPLEXITY: Moderate     GOALS: Goals reviewed with patient?  yes   SHORT TERM GOALS: Target date: 12/30/2021  Patient will be independent in self management strategies to improve quality of life and functional outcomes. Baseline: New Program Goal status: INITIAL   2.  Patient will report at least 25% improvement in overall symptoms and/or function to demonstrate improved functional mobility Baseline: 0% better Goal status: INITIAL   3.  Patient will reporting modifying heights of regularly used chairs at home with cushions to improve posture while sitting at home  Baseline:  Goal status: INITIAL           LONG TERM GOALS: Target date: 01/26/2022     Patient will report at least 50% improvement in overall symptoms and/or function to demonstrate improved functional mobility Baseline: 0% better Goal status: INITIAL   2.  Patient will be able to demonstrate at least 160 degrees of B shoulder ROM to improve overhead reaching Baseline:  Goal status: INITIAL   3.  Patient will be able to sit in active sitting posture for a least 2 minutes to demonstrate improved postural strength. Baseline:  Goal status: INITIAL     PLAN:   PT FREQUENCY: 2x/week   PT DURATION: 8 weeks   PLANNED INTERVENTIONS: Therapeutic exercises, Therapeutic activity, Neuromuscular re-education, Balance training, Gait training, Patient/Family education, Self Care, Joint mobilization, Joint manipulation, DME instructions, Dry Needling, Electrical stimulation, Cryotherapy, Moist heat, Ionotophoresis '4mg'$ /ml Dexamethasone, Manual therapy, and Re-evaluation   PLAN FOR NEXT SESSION:    posture, pelvic tilts, ROM, prone lying with pillows   1:54 PM, 12/16/21 Jerene Pitch, DPT Physical Therapy with [facility]

## 2021-12-17 DIAGNOSIS — Z9889 Other specified postprocedural states: Secondary | ICD-10-CM | POA: Diagnosis not present

## 2021-12-17 DIAGNOSIS — Z79899 Other long term (current) drug therapy: Secondary | ICD-10-CM | POA: Diagnosis not present

## 2021-12-17 DIAGNOSIS — G35 Multiple sclerosis: Secondary | ICD-10-CM | POA: Diagnosis not present

## 2021-12-17 DIAGNOSIS — Z8719 Personal history of other diseases of the digestive system: Secondary | ICD-10-CM | POA: Diagnosis not present

## 2021-12-17 DIAGNOSIS — Z79631 Long term (current) use of antimetabolite agent: Secondary | ICD-10-CM | POA: Diagnosis not present

## 2021-12-18 ENCOUNTER — Encounter: Payer: Self-pay | Admitting: Physical Therapy

## 2021-12-18 ENCOUNTER — Ambulatory Visit (INDEPENDENT_AMBULATORY_CARE_PROVIDER_SITE_OTHER): Payer: Medicare Other | Admitting: Physical Therapy

## 2021-12-18 DIAGNOSIS — M6281 Muscle weakness (generalized): Secondary | ICD-10-CM

## 2021-12-18 DIAGNOSIS — M5459 Other low back pain: Secondary | ICD-10-CM | POA: Diagnosis not present

## 2021-12-18 DIAGNOSIS — M25511 Pain in right shoulder: Secondary | ICD-10-CM | POA: Diagnosis not present

## 2021-12-18 DIAGNOSIS — M25512 Pain in left shoulder: Secondary | ICD-10-CM | POA: Diagnosis not present

## 2021-12-18 DIAGNOSIS — G8929 Other chronic pain: Secondary | ICD-10-CM | POA: Diagnosis not present

## 2021-12-18 NOTE — Therapy (Signed)
OUTPATIENT PHYSICAL THERAPY TREATMENT NOTE   Patient Name: Emily Wolfe MRN: [id number] DOB:Jul 01, 1955, 66 y.o., female Today's Date: 12/18/2021   PCP: Vivi Barrack, MD   REFERRING PROVIDER: Gregor Hams, MD  END OF SESSION:   PT End of Session - 12/18/21 1302     Visit Number 4    Number of Visits 16    Date for PT Re-Evaluation 01/26/22    Authorization Type UHC- VL med necc    PT Start Time 4967    PT Stop Time 5916    PT Time Calculation (min) 38 min             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   History of ovarian cancer 11/19/2017   History of stomach ulcers    Hyperlipidemia 06/29/2017   Hypertension 06/29/2017   Infection 10/2007   From hysterectomy sutures rupturing   Inguinal hernia    Joint pain    Lactose intolerance    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   Mild intermittent asthma without complication 38/46/6599   Multiple sclerosis ([place]) 06/29/2017   Neuromuscular disorder ([place])    Osteoarthritis    Palpitations    Postmenopausal 06/29/2017   Scoliosis of lumbar spine 06/29/2017   Small bowel obstruction ([place]) 06/29/2017   Swallowing difficulty    Umbilical hernia    Urticaria    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   Vitamin D deficiency 06/29/2017   Past Surgical History:  Procedure Laterality Date   ADENOIDECTOMY     CHOLECYSTECTOMY  2009   HERNIA REPAIR     HERNIA REPAIR     6/10, 9/10   HYSTERECTOMY ABDOMINAL WITH SALPINGO-OOPHORECTOMY     HYSTEROTOMY  2008   LAPAROSCOPIC LYSIS OF ADHESIONS     , With small bowel resection and ventral hernia repair with mesh 2019/[place]   MOHS  SURGERY     SINOSCOPY     VARICOSE VEIN SURGERY     Patient Active Problem List   Diagnosis Date Noted   S/P lumbar fusion 05/19/2021   Primary osteoarthritis of right hip 12/27/2020   Osteoarthritis 11/22/2020   Dehydration 08/31/2020   Class 1 obesity with serious comorbidity and body mass index (BMI) of 30.0 to 30.9 in adult 01/26/2020   SBO (small bowel obstruction) ([place]) 03/21/2019   Nightmares 03/07/2019   Anxiety 12/20/2018   Cervicalgia 08/18/2018   Cough variant asthma vs UACS 01/03/2018   History of total hysterectomy, due to ovarian cancer 12/26/2017   Genital warts 11/19/2017   History of ovarian cancer 11/19/2017   Seasonal and perennial allergic rhinitis, followed by Allergist, treated with saline rinses and allergy shots 11/02/2017   Mild intermittent asthma without complication 35/70/1779   Hyperlipidemia 06/29/2017   Essential hypertension 06/29/2017   MS (multiple sclerosis) ([place]) 06/29/2017   Scoliosis of lumbar spine 06/29/2017   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   Vitamin D deficiency 06/29/2017   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   Lung mass 06/29/2017   Colon abnormality 05/11/2017  THERAPY DIAG:  Chronic pain of both shoulders  Other low back pain  Muscle weakness (generalized)    REFERRING DIAG: M25.511,G89.29,M25.512 (ICD-10-CM) - Chronic pain of both shoulders M54.50,G89.29 (ICD-10-CM) - Chronic bilateral low back pain without sciatica     Rationale for Evaluation and Treatment: Rehabilitation   ONSET DATE: earlier this year for back pain, and 2 years ago for shoulders.    SUBJECTIVE:                                                                                                                                                                                       SUBJECTIVE STATEMENT: 12/18/2021 States her back is bothering her today, she thinks she spent too much time in the car yesterday.  States her pain is better now that she took something. States it felt seized up this morning.   Eval: States that she has had pain in her back for a while and was having right leg pain and then decided to have lumbar fusion L3/4. States that she had rehab for her right hip and that set in motion for her back pain. States she hasn't done much for her back since the surgery but she does a pilates class 1x/week and end of this month she will do 2x/week at [facility]. States she has gained 30 pounds since the surgery. States that she has difficulties standing upright after her hip rehab earlier this year.    Reports she has baseline arm pain due to her MS but this shoulder pain is different, this pain is in the front and top of her shoulders when reaching up overhead   PERTINENT HISTORY: MS, lumbar spinal fusion L3/4 05/19/21, hx of ovarian cancer and 8 gut surgeries. Hernias, foot drop on right (when fatigued), fainting spells   PAIN:  Are you having pain? Yes: NPRS scale: 2/10 Pain location: LBP  Pain description:stiffness      PRECAUTIONS: None   WEIGHT BEARING RESTRICTIONS: No   FALLS:  Has patient fallen in last 6 months? 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    NEXT MD VISIT:    OBJECTIVE:    DIAGNOSTIC FINDINGS:  right shoulder xray IMPRESSION: Diffuse osteopenia. Acromioclavicular and glenohumeral degenerative change. Subacromial spurring. A calcific density is noted over the proximal right humerus, this could be related to calcific tendinosis or a loose body. No acute bony abnormality identified.   Left shoulder xray IMPRESSION: Diffuse  osteopenia. Acromioclavicular and glenohumeral degenerative change. Mild subacromial spurring. No acute abnormality identified.     COGNITION: Overall  cognitive status: Within functional limits for tasks assessed                                      POSTURE: Slumped forward postured, limited hip/lumbar extension with walking. Forward head, rounded shoulders, sacral sitting   ROM Neck motion limited in all directions but no pain with motion                        UE Measurements       Upper Extremity Right 12/02/2021 Left 12/02/2021    A/PROM MMT A/PROM MMT  Shoulder Flexion 160** 4- 145 3+  Shoulder Extension          Shoulder Abduction WFL - scaption   WFL - Scaption    Shoulder Adduction          Shoulder Internal Rotation Reaches to L3  SP* 3+  Reaches to L1 SP* 3+  Shoulder External Rotation Reaches to C7 SP 3+ Reaches to C7 SP 3+  Elbow Flexion          Elbow Extension          Wrist Flexion          Wrist Extension          Wrist Supination          Wrist Pronation          Wrist Ulnar Deviation          Wrist Radial Deviation          Grip Strength NA   NA                          (Blank rows = not tested)                       * pain/tightness **  difficult to perform movement Pt is right handed   Transitional Movements: STS - Heavy uses of B UE to transition and labored effort     PALPATION:  Tenderness to palpation along anterior shoulder, biceps and deltoid R>L             TODAY'S TREATMENT:                                                                                                                                         DATE:  12/18/2021  Therapeutic Exercise:    Aerobic: Supine:  Prone:    Seated: shoulder circles x30 CW and CCW x20 B and each, neck rotation x15 5" holds B, scap retraction x25 5" holds    Standing: shoulder flexion with dowel and  towel down t-spine at wall - 3x12 B slow and controled, shoulder ER 3x15 B, corner pec stretch 2x5 10-15 seconds holds, shoulder flexion up wall  2x20  B  Neuromuscular Re-education: Manual Therapy: Therapeutic Activity: Self Care: Trigger Point Dry  Needling:  Modalities: thermotherapy while performing seated exercises to neck and back      PATIENT EDUCATION:  Education details: on HEP Person educated: Patient Education method: Consulting civil engineer, Media planner, and Handouts Education comprehension: verbalized understanding     HOME EXERCISE PROGRAM: [id number]  ASSESSMENT:   CLINICAL IMPRESSION: 12/18/2021 Continued to focus on posture and shoulder mobility, this was tolerated mildly well. Reminded patient to reduce holds as she has a tendency to hold exercises for too long. Slight increased shoulder soreness noted end of session, transitioned to seated exercises with heat and this was tolerated better. Will continue with current POC as tolerated.   Eval: Patient presents to physical therapy with complaints of bilateral shoulder pain and low back pain. Patient with history of lumbar surgery earlier this year which relieved her leg pain but not her back pain. Since then her shoulders have really started to bother her. Patient with history of 10+ abdominal surgeries that is likely contributing to current condition and posture. Patient would greatly benefit from skilled PT to improve overall function and QOL.    OBJECTIVE IMPAIRMENTS: decreased activity tolerance, decreased mobility, difficulty walking, decreased ROM, decreased strength, impaired UE functional use, improper body mechanics, postural dysfunction, and pain.    ACTIVITY LIMITATIONS: carrying, lifting, sitting, standing, transfers, reach over head, and locomotion level   PARTICIPATION LIMITATIONS: community activity, occupation, and yard work   PERSONAL FACTORS: Age, Fitness, Past/current experiences, and 3+ comorbidities: multiple abdominal surgeries, lumbar fusion L3/4  are also affecting patient's functional outcome.    REHAB POTENTIAL: Good   CLINICAL DECISION MAKING: Evolving/moderate complexity   EVALUATION COMPLEXITY: Moderate     GOALS: Goals reviewed with patient?  yes   SHORT TERM GOALS: Target date: 12/30/2021  Patient will be independent in self management strategies to improve quality of life and functional outcomes. Baseline: New Program Goal status: INITIAL   2.  Patient will report at least 25% improvement in overall symptoms and/or function to demonstrate improved functional mobility Baseline: 0% better Goal status: INITIAL   3.  Patient will reporting modifying heights of regularly used chairs at home with cushions to improve posture while sitting at home  Baseline:  Goal status: INITIAL           LONG TERM GOALS: Target date: 01/26/2022     Patient will report at least 50% improvement in overall symptoms and/or function to demonstrate improved functional mobility Baseline: 0% better Goal status: INITIAL   2.  Patient will be able to demonstrate at least 160 degrees of B shoulder ROM to improve overhead reaching Baseline:  Goal status: INITIAL   3.  Patient will be able to sit in active sitting posture for a least 2 minutes to demonstrate improved postural strength. Baseline:  Goal status: INITIAL     PLAN:   PT FREQUENCY: 2x/week   PT DURATION: 8 weeks   PLANNED INTERVENTIONS: Therapeutic exercises, Therapeutic activity, Neuromuscular re-education, Balance training, Gait training, Patient/Family education, Self Care, Joint mobilization, Joint manipulation, DME instructions, Dry Needling, Electrical stimulation, Cryotherapy, Moist heat, Ionotophoresis '4mg'$ /ml Dexamethasone, Manual therapy, and Re-evaluation   PLAN FOR NEXT SESSION:    posture, pelvic tilts, ROM, prone lying with pillows   1:46 PM, 12/18/21 Jerene Pitch, DPT Physical Therapy  with [place]

## 2021-12-23 ENCOUNTER — Ambulatory Visit: Payer: Medicare Other | Admitting: Physical Therapy

## 2021-12-23 ENCOUNTER — Encounter: Payer: Self-pay | Admitting: Physical Therapy

## 2021-12-23 DIAGNOSIS — M6281 Muscle weakness (generalized): Secondary | ICD-10-CM

## 2021-12-23 DIAGNOSIS — M25511 Pain in right shoulder: Secondary | ICD-10-CM | POA: Diagnosis not present

## 2021-12-23 DIAGNOSIS — M5459 Other low back pain: Secondary | ICD-10-CM

## 2021-12-23 DIAGNOSIS — G8929 Other chronic pain: Secondary | ICD-10-CM

## 2021-12-23 DIAGNOSIS — M25512 Pain in left shoulder: Secondary | ICD-10-CM | POA: Diagnosis not present

## 2021-12-23 NOTE — Therapy (Signed)
OUTPATIENT PHYSICAL THERAPY TREATMENT NOTE   Patient Name: Emily Wolfe MRN: [id number] DOB:1956-01-03, 66 y.o., female Today's Date: 12/23/2021   PCP: Vivi Barrack, MD   REFERRING PROVIDER: Gregor Hams, MD  END OF SESSION:   PT End of Session - 12/23/21 1303     Visit Number 5    Number of Visits 16    Date for PT Re-Evaluation 01/26/22    Authorization Type U[employer]- VL med necc    PT Start Time 3810    PT Stop Time 1343    PT Time Calculation (min) 38 min             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   History of ovarian cancer 11/19/2017   History of stomach ulcers    Hyperlipidemia 06/29/2017   Hypertension 06/29/2017   Infection 10/2007   From hysterectomy sutures rupturing   Inguinal hernia    Joint pain    Lactose intolerance    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   Mild intermittent asthma without complication 17/51/0258   Multiple sclerosis ([place]) 06/29/2017   Neuromuscular disorder ([place])    Osteoarthritis    Palpitations    Postmenopausal 06/29/2017   Scoliosis of lumbar spine 06/29/2017   Small bowel obstruction ([place]) 06/29/2017   Swallowing difficulty    Umbilical hernia    Urticaria    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   Vitamin D deficiency 06/29/2017   Past Surgical History:  Procedure Laterality Date   ADENOIDECTOMY     CHOLECYSTECTOMY  2009   HERNIA REPAIR     HERNIA REPAIR     6/10, 9/10   HYSTERECTOMY ABDOMINAL WITH SALPINGO-OOPHORECTOMY     HYSTEROTOMY  2008   LAPAROSCOPIC LYSIS OF ADHESIONS     , With small bowel resection and ventral hernia repair with mesh 2019/[place]   MOHS  SURGERY     SINOSCOPY     VARICOSE VEIN SURGERY     Patient Active Problem List   Diagnosis Date Noted   S/P lumbar fusion 05/19/2021   Primary osteoarthritis of right hip 12/27/2020   Osteoarthritis 11/22/2020   Dehydration 08/31/2020   Class 1 obesity with serious comorbidity and body mass index (BMI) of 30.0 to 30.9 in adult 01/26/2020   SBO (small bowel obstruction) ([place]) 03/21/2019   Nightmares 03/07/2019   Anxiety 12/20/2018   Cervicalgia 08/18/2018   Cough variant asthma vs UACS 01/03/2018   History of total hysterectomy, due to ovarian cancer 12/26/2017   Genital warts 11/19/2017   History of ovarian cancer 11/19/2017   Seasonal and perennial allergic rhinitis, followed by Allergist, treated with saline rinses and allergy shots 11/02/2017   Mild intermittent asthma without complication 52/77/8242   Hyperlipidemia 06/29/2017   Essential hypertension 06/29/2017   MS (multiple sclerosis) ([place]) 06/29/2017   Scoliosis of lumbar spine 06/29/2017   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   Vitamin D deficiency 06/29/2017   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   Lung mass 06/29/2017   Colon abnormality 05/11/2017  THERAPY DIAG:  Chronic pain of both shoulders  Other low back pain  Muscle weakness (generalized)    REFERRING DIAG: M25.511,G89.29,M25.512 (ICD-10-CM) - Chronic pain of both shoulders M54.50,G89.29 (ICD-10-CM) - Chronic bilateral low back pain without sciatica     Rationale for Evaluation and Treatment: Rehabilitation   ONSET DATE: earlier this year for back pain, and 2 years ago for shoulders.    SUBJECTIVE:                                                                                                                                                                                       SUBJECTIVE STATEMENT: 12/23/2021 States her pain is not much better but she is no longer taking the pain meds. States she has been  out doing a lot which is probably causing her current symptoms. Reports overall she feels about 10% in regards to her shoulders since the start of PT. States back pain has really been her focus recently.    PERTINENT HISTORY: MS, lumbar spinal fusion L3/4 05/19/21, hx of ovarian cancer and 8 gut surgeries. Hernias, foot drop on right (when fatigued), fainting spells   PAIN:  Are you having pain? Yes: NPRS scale: 2/10 Pain location: LBP, shoulders are 1/10 Pain description:stiffness, achiness     PRECAUTIONS: None   WEIGHT BEARING RESTRICTIONS: No   FALLS:  Has patient fallen in last 6 months? 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    NEXT MD VISIT:    OBJECTIVE:    DIAGNOSTIC FINDINGS:  right shoulder xray IMPRESSION: Diffuse osteopenia. Acromioclavicular and glenohumeral degenerative change. Subacromial spurring. A calcific density is noted over the proximal right humerus, this could be related to calcific tendinosis or a loose body. No acute bony abnormality identified.   Left shoulder xray IMPRESSION: Diffuse osteopenia. Acromioclavicular and glenohumeral degenerative change. Mild subacromial spurring. No acute abnormality identified.        POSTURE: Slumped forward postured, limited hip/lumbar extension with walking. Forward head, rounded shoulders, sacral sitting   ROM Neck motion limited in all directions but no pain with motion                        UE Measurements       Upper Extremity Right 12/23/2021 Left 12/23/2021    A/PROM MMT A/PROM MMT  Shoulder Flexion 160** 4-* 150** 4-*  Shoulder Extension  Shoulder Abduction WFL - scaption**   WFL - Scaption**    Shoulder Adduction          Shoulder Internal Rotation Reaches to L2  SP* 3+  Reaches to L1 SP* 3+  Shoulder  External Rotation Reaches to C7 SP 4 Reaches to C7 SP 4  Elbow Flexion          Elbow Extension          Wrist Flexion          Wrist Extension          Wrist Supination          Wrist Pronation          Wrist Ulnar Deviation          Wrist Radial Deviation          Grip Strength NA   NA                          (Blank rows = not tested)                       * pain/tightness **  difficult to perform movement Pt is right handed   Transitional Movements: STS - Heavy uses of B UE to transition and labored effort  TODAY'S TREATMENT:                                                                                                                                         DATE:  12/23/2021  Therapeutic Exercise:    Aerobic: Supine:  Prone:    Seated:  shoulder ER 2x15 5" holds B    Standing: shoulder flexion up wall 2 minutes, shoulder flexion back at wall x3 1 minute bouts, shoulder extension iso into wall with back against the wall x2 1 minute bouts with 5" holds    Neuromuscular Re-education: Manual Therapy: Therapeutic Activity: Self Care: Trigger Point Dry Needling:  Modalities:      PATIENT EDUCATION:  Education details: on HEP, on different types of braces per patient inquiry. Person educated: Patient Education method: Explanation, Demonstration, and Handouts Education comprehension: verbalized understanding     HOME EXERCISE PROGRAM: [id number]  ASSESSMENT:   CLINICAL IMPRESSION: 12/23/2021 Continued to review exercises and answer all questions. Patient inquired about different types of braces that may be helpful. Discussed possible benefits but not relying on the brace full time secondary to concerns of increased weakness. Overall limited session secondary to patient fixation on current presentation and limitations. Encouraged patient to continue with current HEP and posture Will continue with current POC as tolerated.     OBJECTIVE IMPAIRMENTS: decreased activity  tolerance, decreased mobility, difficulty walking, decreased ROM, decreased strength, impaired UE functional use, improper body mechanics, postural dysfunction, and pain.    ACTIVITY LIMITATIONS: carrying, lifting, sitting, standing, transfers, reach over  head, and locomotion level   PARTICIPATION LIMITATIONS: community activity, occupation, and yard work   PERSONAL FACTORS: Age, Fitness, Past/current experiences, and 3+ comorbidities: multiple abdominal surgeries, lumbar fusion L3/4  are also affecting patient's functional outcome.    REHAB POTENTIAL: Good   CLINICAL DECISION MAKING: Evolving/moderate complexity   EVALUATION COMPLEXITY: Moderate     GOALS: Goals reviewed with patient? yes   SHORT TERM GOALS: Target date: 12/30/2021  Patient will be independent in self management strategies to improve quality of life and functional outcomes. Baseline: New Program Goal status: PROGRESSING   2.  Patient will report at least 25% improvement in overall symptoms and/or function to demonstrate improved functional mobility Baseline: 0% better Goal status: PROGRESSING   3.  Patient will reporting modifying heights of regularly used chairs at home with cushions to improve posture while sitting at home  Baseline:  Goal status: MET           LONG TERM GOALS: Target date: 01/26/2022     Patient will report at least 50% improvement in overall symptoms and/or function to demonstrate improved functional mobility Baseline: 0% better Goal status: PROGRESSING   2.  Patient will be able to demonstrate at least 160 degrees of B shoulder ROM to improve overhead reaching Baseline:  Goal status: PROGRESSING   3.  Patient will be able to sit in active sitting posture for a least 2 minutes to demonstrate improved postural strength. Baseline:  Goal status: MET     PLAN:   PT FREQUENCY: 2x/week   PT DURATION: 8 weeks   PLANNED INTERVENTIONS: Therapeutic exercises, Therapeutic activity,  Neuromuscular re-education, Balance training, Gait training, Patient/Family education, Self Care, Joint mobilization, Joint manipulation, DME instructions, Dry Needling, Electrical stimulation, Cryotherapy, Moist heat, Ionotophoresis 60m/ml Dexamethasone, Manual therapy, and Re-evaluation   PLAN FOR NEXT SESSION:    posture, pelvic tilts, ROM, prone lying with pillows   1:43 PM, 12/23/21 MJerene Pitch DPT Physical Therapy with CRoyston Sinner

## 2021-12-25 NOTE — Progress Notes (Signed)
   I, Emily Wolfe, LAT, ATC acting as a scribe for Emily Leader, MD.  Emily Wolfe is a 66 y.o. female who presents to [place] at [facility] today for f/u bilateral shoulder pain and LBP. Pt has a hx of MS. Pt was last seen by Dr. Georgina Snell on 11/17/2021 and was referred to PT, completing 5 visits. Today, pt reports she has been doing PT and pilates. Pt has been distracted by by her nasal surgery that she had Friday.  She still has the packing in her sinuses from a nasal turbinate surgery.  She has the packing scheduled to be removed later today.  Dx imaging: 11/17/21 R & L shoulder XR  Pertinent review of systems: No fevers or chills.  Positive for facial discomfort and nasal congestion and runny nose.  Relevant historical information: Hypertension.  MS. Lumbar scoliosis  Exam:  BP (!) 172/92   Pulse 78   Ht '5\' 4"'$  (1.626 m)   Wt 164 lb (74.4 kg)   SpO2 98%   BMI 28.15 kg/m  General: Well Developed, well nourished, and in no acute distress.   MSK: Shoulders bilaterally normal motion. L-spine decreased lumbar motion.     Assessment and Plan: 66 y.o. female with chronic bilateral shoulder pain and low back pain. She has had marginal to moderate improvement with physical therapy for her shoulders and back.  She is in the process of transitioning away from physical therapy more to home exercise program which I think is been generally successful.  She is focusing on Pilates which I think will be helpful.  For now her symptoms are not bothersome enough to proceed to more aggressive treatment such as injections or MRIs.  Plan to continue home exercise program and check back with me as needed.  For her shoulders I can proceed to steroid injections or even MRIs if needed.  Total encounter time 20 minutes including face-to-face time with the patient and, reviewing past medical record, and charting on the date of service.     Discussed warning signs or symptoms.  Please see discharge instructions. Patient expresses understanding.   The above documentation has been reviewed and is accurate and complete Emily Wolfe, M.D.

## 2021-12-26 DIAGNOSIS — J3489 Other specified disorders of nose and nasal sinuses: Secondary | ICD-10-CM | POA: Diagnosis not present

## 2021-12-26 DIAGNOSIS — J343 Hypertrophy of nasal turbinates: Secondary | ICD-10-CM | POA: Diagnosis not present

## 2021-12-26 DIAGNOSIS — J309 Allergic rhinitis, unspecified: Secondary | ICD-10-CM | POA: Diagnosis not present

## 2021-12-30 ENCOUNTER — Ambulatory Visit: Payer: Medicare Other | Admitting: Family Medicine

## 2021-12-30 ENCOUNTER — Encounter: Payer: Medicare Other | Admitting: Physical Therapy

## 2021-12-30 VITALS — BP 172/92 | HR 78 | Ht 64.0 in | Wt 164.0 lb

## 2021-12-30 DIAGNOSIS — M545 Low back pain, unspecified: Secondary | ICD-10-CM | POA: Diagnosis not present

## 2021-12-30 DIAGNOSIS — M25512 Pain in left shoulder: Secondary | ICD-10-CM | POA: Diagnosis not present

## 2021-12-30 DIAGNOSIS — G8929 Other chronic pain: Secondary | ICD-10-CM

## 2021-12-30 DIAGNOSIS — M25511 Pain in right shoulder: Secondary | ICD-10-CM

## 2021-12-30 NOTE — Patient Instructions (Addendum)
Thank you for coming in today.   Ok to give the shoulder some time.   We can do an injection whenever you want.   Let me know.   Recheck as needed.

## 2022-01-01 ENCOUNTER — Encounter: Payer: Self-pay | Admitting: Physical Therapy

## 2022-01-01 ENCOUNTER — Ambulatory Visit: Payer: Medicare Other | Admitting: Physical Therapy

## 2022-01-01 DIAGNOSIS — G8929 Other chronic pain: Secondary | ICD-10-CM

## 2022-01-01 DIAGNOSIS — M5459 Other low back pain: Secondary | ICD-10-CM

## 2022-01-01 DIAGNOSIS — M6281 Muscle weakness (generalized): Secondary | ICD-10-CM

## 2022-01-01 DIAGNOSIS — M25512 Pain in left shoulder: Secondary | ICD-10-CM

## 2022-01-01 DIAGNOSIS — M25511 Pain in right shoulder: Secondary | ICD-10-CM | POA: Diagnosis not present

## 2022-01-01 NOTE — Therapy (Signed)
OUTPATIENT PHYSICAL THERAPY TREATMENT NOTE   Patient Name: Emily Wolfe MRN: [id number] DOB:February 02, 1955, 66 y.o., female Today's Date: 01/01/2022   PCP: Vivi Barrack, MD   REFERRING PROVIDER: Gregor Hams, MD  END OF SESSION:   PT End of Session - 01/01/22 1357     Visit Number 6    Number of Visits 16    Date for PT Re-Evaluation 01/26/22    Authorization Type UHC- VL med necc    PT Start Time 6433   late to apt   PT Stop Time 1436    PT Time Calculation (min) 38 min             Past Medical History:  Diagnosis Date   Anemia    Angio-edema    Back pain    Bilateral swelling of feet    Cancer ([place])    Chronic pain of both upper extremities    Eczema    Gallbladder problem    Genital warts 11/19/2017   GERD (gastroesophageal reflux disease)    Heartburn    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   History of ovarian cancer 11/19/2017   History of stomach ulcers    Hyperlipidemia 06/29/2017   Hypertension 06/29/2017   Infection 10/2007   From hysterectomy sutures rupturing   Inguinal hernia    Joint pain    Lactose intolerance    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   Mild intermittent asthma without complication 29/51/8841   Multiple sclerosis ([place]) 06/29/2017   Neuromuscular disorder ([place])    Osteoarthritis    Palpitations    Postmenopausal 06/29/2017   Scoliosis of lumbar spine 06/29/2017   Small bowel obstruction ([place]) 06/29/2017   Swallowing difficulty    Umbilical hernia    Urticaria    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   Vitamin D deficiency 06/29/2017   Past Surgical History:  Procedure Laterality Date   ADENOIDECTOMY     CHOLECYSTECTOMY  2009   HERNIA REPAIR     HERNIA REPAIR     6/10, 9/10   HYSTERECTOMY ABDOMINAL WITH SALPINGO-OOPHORECTOMY     HYSTEROTOMY  2008   LAPAROSCOPIC LYSIS OF ADHESIONS     , With small bowel resection and ventral hernia repair with mesh  2019/[place]   MOHS SURGERY     SINOSCOPY     VARICOSE VEIN SURGERY     Patient Active Problem List   Diagnosis Date Noted   S/P lumbar fusion 05/19/2021   Primary osteoarthritis of right hip 12/27/2020   Osteoarthritis 11/22/2020   Dehydration 08/31/2020   Class 1 obesity with serious comorbidity and body mass index (BMI) of 30.0 to 30.9 in adult 01/26/2020   SBO (small bowel obstruction) ([place]) 03/21/2019   Nightmares 03/07/2019   Anxiety 12/20/2018   Cervicalgia 08/18/2018   Cough variant asthma vs UACS 01/03/2018   History of total hysterectomy, due to ovarian cancer 12/26/2017   Genital warts 11/19/2017   History of ovarian cancer 11/19/2017   Seasonal and perennial allergic rhinitis, followed by Allergist, treated with saline rinses and allergy shots 11/02/2017   Mild intermittent asthma without complication 66/06/3014   Hyperlipidemia 06/29/2017   Essential hypertension 06/29/2017   MS (multiple sclerosis) ([place]) 06/29/2017   Scoliosis of lumbar spine 06/29/2017   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   Vitamin D deficiency 06/29/2017   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   Lung mass 06/29/2017   Colon abnormality  05/11/2017    THERAPY DIAG:  Chronic pain of both shoulders  Other low back pain  Muscle weakness (generalized)    REFERRING DIAG: M25.511,G89.29,M25.512 (ICD-10-CM) - Chronic pain of both shoulders M54.50,G89.29 (ICD-10-CM) - Chronic bilateral low back pain without sciatica     Rationale for Evaluation and Treatment: Rehabilitation   ONSET DATE: earlier this year for back pain, and 2 years ago for shoulders.    SUBJECTIVE:                                                                                                                                                                                       SUBJECTIVE STATEMENT: 01/01/2022 States she had her nasal surgery last week and she didn't do any  exercises because when she moved she would start bleeding. States she got the packing removed on Tuesday. States that she went to the MD on Tuesday and did not recommend the brace. States that if the pain becomes cumbersome he might suggest an injection.     PERTINENT HISTORY: MS, lumbar spinal fusion L3/4 05/19/21, hx of ovarian cancer and 8 gut surgeries. Hernias, foot drop on right (when fatigued), fainting spells   PAIN:  Are you having pain? Yes: NPRS scale: 1-2/10 Pain location: LBP, shoulders are 1/10 Pain description:stiffness, achiness     PRECAUTIONS: None   WEIGHT BEARING RESTRICTIONS: No   FALLS:  Has patient fallen in last 6 months? 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    NEXT MD VISIT:    OBJECTIVE:    DIAGNOSTIC FINDINGS:  right shoulder xray IMPRESSION: Diffuse osteopenia. Acromioclavicular and glenohumeral degenerative change. Subacromial spurring. A calcific density is noted over the proximal right humerus, this could be related to calcific tendinosis or a loose body. No acute bony abnormality identified.   Left shoulder xray IMPRESSION: Diffuse osteopenia. Acromioclavicular and glenohumeral degenerative change. Mild subacromial spurring. No acute abnormality identified.        POSTURE: Slumped forward postured, limited hip/lumbar extension with walking. Forward head, rounded shoulders, sacral sitting   ROM Neck motion limited in all directions but no pain with motion                        UE Measurements       Upper Extremity Right 12/23/2021 Left 12/23/2021    A/PROM MMT A/PROM MMT  Shoulder Flexion 160** 4-* 150** 4-*  Shoulder Extension  Shoulder Abduction WFL - scaption**   WFL - Scaption**    Shoulder Adduction          Shoulder Internal Rotation  Reaches to L2  SP* 3+  Reaches to L1 SP* 3+  Shoulder External Rotation Reaches to C7 SP 4 Reaches to C7 SP 4  Elbow Flexion          Elbow Extension          Wrist Flexion          Wrist Extension          Wrist Supination          Wrist Pronation          Wrist Ulnar Deviation          Wrist Radial Deviation          Grip Strength NA   NA                          (Blank rows = not tested)                       * pain/tightness **  difficult to perform movement Pt is right handed   Transitional Movements: STS - Heavy uses of B UE to transition and labored effort  TODAY'S TREATMENT:                                                                                                                                         DATE:  01/01/2022  Therapeutic Exercise:    Aerobic: Supine:  Prone:    Seated:  shoulder ER 2x15 5" holds B    Standing: shoulder flexion up wall 1 minutes x3, shoulder flexion back at wall x3 1 minute bouts with towel roll between shoulder blades,     Neuromuscular Re-education: Manual Therapy: Therapeutic Activity: Self Care: Trigger Point Dry Needling:  Modalities:      PATIENT EDUCATION:  Education details: on HEP, on rationale for PT, on importance of Adherence, on POC and typical progress/reassessment Person educated: Patient Education method: Explanation, Demonstration, and Handouts Education comprehension: verbalized understanding     HOME EXERCISE PROGRAM: [id number]  ASSESSMENT:   CLINICAL IMPRESSION: 01/01/2022 Session limited secondary to late arrival and with focus on education and answering all questions. Redirected session as able to add in exercises for shoulder mobility and posture. Discussed importance of adherence to HEP at home. Will continue with current POC as tolerated.     OBJECTIVE IMPAIRMENTS: decreased activity tolerance, decreased mobility, difficulty walking, decreased ROM, decreased strength, impaired UE functional use,  improper body mechanics, postural dysfunction, and pain.    ACTIVITY LIMITATIONS: carrying, lifting, sitting, standing, transfers, reach over head, and locomotion level   PARTICIPATION LIMITATIONS: community activity, occupation, and yard work   PERSONAL FACTORS: Age, Fitness, Past/current experiences, and 3+ comorbidities:  multiple abdominal surgeries, lumbar fusion L3/4  are also affecting patient's functional outcome.    REHAB POTENTIAL: Good   CLINICAL DECISION MAKING: Evolving/moderate complexity   EVALUATION COMPLEXITY: Moderate     GOALS: Goals reviewed with patient? yes   SHORT TERM GOALS: Target date: 12/30/2021  Patient will be independent in self management strategies to improve quality of life and functional outcomes. Baseline: New Program Goal status: PROGRESSING   2.  Patient will report at least 25% improvement in overall symptoms and/or function to demonstrate improved functional mobility Baseline: 0% better Goal status: PROGRESSING   3.  Patient will reporting modifying heights of regularly used chairs at home with cushions to improve posture while sitting at home  Baseline:  Goal status: MET           LONG TERM GOALS: Target date: 01/26/2022     Patient will report at least 50% improvement in overall symptoms and/or function to demonstrate improved functional mobility Baseline: 0% better Goal status: PROGRESSING   2.  Patient will be able to demonstrate at least 160 degrees of B shoulder ROM to improve overhead reaching Baseline:  Goal status: PROGRESSING   3.  Patient will be able to sit in active sitting posture for a least 2 minutes to demonstrate improved postural strength. Baseline:  Goal status: MET     PLAN:   PT FREQUENCY: 2x/week   PT DURATION: 8 weeks   PLANNED INTERVENTIONS: Therapeutic exercises, Therapeutic activity, Neuromuscular re-education, Balance training, Gait training, Patient/Family education, Self Care, Joint  mobilization, Joint manipulation, DME instructions, Dry Needling, Electrical stimulation, Cryotherapy, Moist heat, Ionotophoresis 43m/ml Dexamethasone, Manual therapy, and Re-evaluation   PLAN FOR NEXT SESSION:    posture, pelvic tilts, ROM, prone lying with pillows   2:36 PM, 01/01/22 MJerene Pitch DPT Physical Therapy with CRoyston Sinner

## 2022-01-06 ENCOUNTER — Ambulatory Visit: Payer: Medicare Other | Admitting: Physical Therapy

## 2022-01-06 ENCOUNTER — Encounter: Payer: Self-pay | Admitting: Physical Therapy

## 2022-01-06 DIAGNOSIS — M6281 Muscle weakness (generalized): Secondary | ICD-10-CM | POA: Diagnosis not present

## 2022-01-06 DIAGNOSIS — M5459 Other low back pain: Secondary | ICD-10-CM | POA: Diagnosis not present

## 2022-01-06 DIAGNOSIS — M25511 Pain in right shoulder: Secondary | ICD-10-CM

## 2022-01-06 DIAGNOSIS — G8929 Other chronic pain: Secondary | ICD-10-CM | POA: Diagnosis not present

## 2022-01-06 DIAGNOSIS — M25512 Pain in left shoulder: Secondary | ICD-10-CM

## 2022-01-06 NOTE — Therapy (Signed)
OUTPATIENT PHYSICAL THERAPY TREATMENT NOTE   Patient Name: Emily Wolfe MRN: [id number] DOB:11-04-1955, 67 y.o., female Today's Date: 01/06/2022   PCP: Vivi Barrack, MD   REFERRING PROVIDER: Gregor Hams, MD  END OF SESSION:   PT End of Session - 01/06/22 1305     Visit Number 7    Number of Visits 16    Date for PT Re-Evaluation 01/26/22    Authorization Type [employer]- VL med necc    PT Start Time 2409    PT Stop Time 1343    PT Time Calculation (min) 38 min             Past Medical History:  Diagnosis Date   Anemia    Angio-edema    Back pain    Bilateral swelling of feet    Cancer ([place])    Chronic pain of both upper extremities    Eczema    Gallbladder problem    Genital warts 11/19/2017   GERD (gastroesophageal reflux disease)    Heartburn    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   History of ovarian cancer 11/19/2017   History of stomach ulcers    Hyperlipidemia 06/29/2017   Hypertension 06/29/2017   Infection 10/2007   From hysterectomy sutures rupturing   Inguinal hernia    Joint pain    Lactose intolerance    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   Mild intermittent asthma without complication 73/53/2992   Multiple sclerosis ([place]) 06/29/2017   Neuromuscular disorder ([place])    Osteoarthritis    Palpitations    Postmenopausal 06/29/2017   Scoliosis of lumbar spine 06/29/2017   Small bowel obstruction ([place]) 06/29/2017   Swallowing difficulty    Umbilical hernia    Urticaria    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   Vitamin D deficiency 06/29/2017   Past Surgical History:  Procedure Laterality Date   ADENOIDECTOMY     CHOLECYSTECTOMY  2009   HERNIA REPAIR     HERNIA REPAIR     6/10, 9/10   HYSTERECTOMY ABDOMINAL WITH SALPINGO-OOPHORECTOMY     HYSTEROTOMY  2008   LAPAROSCOPIC LYSIS OF ADHESIONS     , With small bowel resection and ventral hernia repair with mesh 2019/[place]   MOHS  SURGERY     SINOSCOPY     VARICOSE VEIN SURGERY     Patient Active Problem List   Diagnosis Date Noted   S/P lumbar fusion 05/19/2021   Primary osteoarthritis of right hip 12/27/2020   Osteoarthritis 11/22/2020   Dehydration 08/31/2020   Class 1 obesity with serious comorbidity and body mass index (BMI) of 30.0 to 30.9 in adult 01/26/2020   SBO (small bowel obstruction) ([place]) 03/21/2019   Nightmares 03/07/2019   Anxiety 12/20/2018   Cervicalgia 08/18/2018   Cough variant asthma vs UACS 01/03/2018   History of total hysterectomy, due to ovarian cancer 12/26/2017   Genital warts 11/19/2017   History of ovarian cancer 11/19/2017   Seasonal and perennial allergic rhinitis, followed by Allergist, treated with saline rinses and allergy shots 11/02/2017   Mild intermittent asthma without complication 42/68/3419   Hyperlipidemia 06/29/2017   Essential hypertension 06/29/2017   MS (multiple sclerosis) ([place]) 06/29/2017   Scoliosis of lumbar spine 06/29/2017   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   Vitamin D deficiency 06/29/2017   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   Lung mass 06/29/2017   Colon abnormality 05/11/2017  THERAPY DIAG:  Chronic pain of both shoulders  Muscle weakness (generalized)  Other low back pain    REFERRING DIAG: M25.511,G89.29,M25.512 (ICD-10-CM) - Chronic pain of both shoulders M54.50,G89.29 (ICD-10-CM) - Chronic bilateral low back pain without sciatica     Rationale for Evaluation and Treatment: Rehabilitation   ONSET DATE: earlier this year for back pain, and 2 years ago for shoulders.    SUBJECTIVE:                                                                                                                                                                                       SUBJECTIVE STATEMENT: 01/06/2022 States she has had a busy day and drivign a lot so her back is tight and hurting.     PERTINENT  HISTORY: MS, lumbar spinal fusion L3/4 05/19/21, hx of ovarian cancer and 8 gut surgeries. Hernias, foot drop on right (when fatigued), fainting spells   PAIN:  Are you having pain? Yes: NPRS scale: 1-2/10 Pain location: LBP, shoulders are 1/10 Pain description:stiffness, achiness     PRECAUTIONS: None   WEIGHT BEARING RESTRICTIONS: No   FALLS:  Has patient fallen in last 6 months? 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    NEXT MD VISIT:    OBJECTIVE:    DIAGNOSTIC FINDINGS:  right shoulder xray IMPRESSION: Diffuse osteopenia. Acromioclavicular and glenohumeral degenerative change. Subacromial spurring. A calcific density is noted over the proximal right humerus, this could be related to calcific tendinosis or a loose body. No acute bony abnormality identified.   Left shoulder xray IMPRESSION: Diffuse osteopenia. Acromioclavicular and glenohumeral degenerative change. Mild subacromial spurring. No acute abnormality identified.        POSTURE: Slumped forward postured, limited hip/lumbar extension with walking. Forward head, rounded shoulders, sacral sitting   ROM Neck motion limited in all directions but no pain with motion                        UE Measurements       Upper Extremity Right 12/23/2021 Left 12/23/2021    A/PROM MMT A/PROM MMT  Shoulder Flexion 160** 4-* 150** 4-*  Shoulder Extension          Shoulder Abduction WFL - scaption**   WFL - Scaption**    Shoulder Adduction          Shoulder Internal Rotation Reaches to L2  SP* 3+  Reaches to L1 SP* 3+  Shoulder External Rotation Reaches to C7 SP 4 Reaches to C7 SP 4  Elbow Flexion          Elbow Extension          Wrist Flexion          Wrist Extension          Wrist Supination          Wrist Pronation          Wrist  Ulnar Deviation          Wrist Radial Deviation          Grip Strength NA   NA                          (Blank rows = not tested)                       * pain/tightness **  difficult to perform movement Pt is right handed   Transitional Movements: STS - Heavy uses of B UE to transition and labored effort  TODAY'S TREATMENT:                                                                                                                                         DATE:  01/06/2022  Therapeutic Exercise:    Aerobic: Supine:  Prone:    Seated:  pulleys scaption 2 minutes, abd 2 minutes, shoulder ER RTB x4 of 30 second bouts B      Standing: shoulder flexion ball up wall 2 minutes x1, shoulder flexion back at wall x3 1 minute bouts with towel roll between shoulder blades,  shoulder extension with RTB x3 1 minute bouts, shoulder row RTB x2 1 minute bouts    Neuromuscular Re-education: back up against the wall - head level not up towards ceiling- x3 1.5 minutes - then walking away from the wall - educated and practiced seated posture - 5 minutes  Manual Therapy: Therapeutic Activity: Self Care: Trigger Point Dry Needling:  Modalities:      PATIENT EDUCATION:  Education details: on HEP Person educated: Patient Education method: Consulting civil engineer, Media planner, and Handouts Education comprehension: verbalized understanding     HOME EXERCISE PROGRAM: [id number]  ASSESSMENT:   CLINICAL IMPRESSION: 01/06/2022 Session focused on redirection to work on shoulder motion and posture. Focused on changing head position with standing posture secondary to patient's head resting in cervical extension to look straight ahead when seated and standing. Educated patient on this. Fatigue in shoulders noted end of session.    OBJECTIVE IMPAIRMENTS: decreased activity tolerance, decreased mobility, difficulty walking, decreased ROM, decreased strength, impaired UE functional use, improper body mechanics,  postural dysfunction, and pain.    ACTIVITY LIMITATIONS: carrying, lifting, sitting, standing, transfers, reach over head, and locomotion level   PARTICIPATION LIMITATIONS: community activity,  occupation, and yard work   PERSONAL FACTORS: Age, Fitness, Past/current experiences, and 3+ comorbidities: multiple abdominal surgeries, lumbar fusion L3/4  are also affecting patient's functional outcome.    REHAB POTENTIAL: Good   CLINICAL DECISION MAKING: Evolving/moderate complexity   EVALUATION COMPLEXITY: Moderate     GOALS: Goals reviewed with patient? yes   SHORT TERM GOALS: Target date: 12/30/2021  Patient will be independent in self management strategies to improve quality of life and functional outcomes. Baseline: New Program Goal status: PROGRESSING   2.  Patient will report at least 25% improvement in overall symptoms and/or function to demonstrate improved functional mobility Baseline: 0% better Goal status: PROGRESSING   3.  Patient will reporting modifying heights of regularly used chairs at home with cushions to improve posture while sitting at home  Baseline:  Goal status: MET           LONG TERM GOALS: Target date: 01/26/2022     Patient will report at least 50% improvement in overall symptoms and/or function to demonstrate improved functional mobility Baseline: 0% better Goal status: PROGRESSING   2.  Patient will be able to demonstrate at least 160 degrees of B shoulder ROM to improve overhead reaching Baseline:  Goal status: PROGRESSING   3.  Patient will be able to sit in active sitting posture for a least 2 minutes to demonstrate improved postural strength. Baseline:  Goal status: MET     PLAN:   PT FREQUENCY: 2x/week   PT DURATION: 8 weeks   PLANNED INTERVENTIONS: Therapeutic exercises, Therapeutic activity, Neuromuscular re-education, Balance training, Gait training, Patient/Family education, Self Care, Joint mobilization, Joint manipulation, DME  instructions, Dry Needling, Electrical stimulation, Cryotherapy, Moist heat, Ionotophoresis 22m/ml Dexamethasone, Manual therapy, and Re-evaluation   PLAN FOR NEXT SESSION:    posture, pelvic tilts, ROM, prone lying with pillows   1:49 PM, 01/06/22 MJerene Pitch DPT Physical Therapy with [facility] Sinner

## 2022-01-08 ENCOUNTER — Encounter: Payer: Self-pay | Admitting: Physical Therapy

## 2022-01-08 ENCOUNTER — Ambulatory Visit: Payer: Medicare Other | Admitting: Physical Therapy

## 2022-01-08 DIAGNOSIS — M5459 Other low back pain: Secondary | ICD-10-CM | POA: Diagnosis not present

## 2022-01-08 DIAGNOSIS — G8929 Other chronic pain: Secondary | ICD-10-CM | POA: Diagnosis not present

## 2022-01-08 DIAGNOSIS — M6281 Muscle weakness (generalized): Secondary | ICD-10-CM

## 2022-01-08 DIAGNOSIS — M25511 Pain in right shoulder: Secondary | ICD-10-CM | POA: Diagnosis not present

## 2022-01-08 DIAGNOSIS — M25512 Pain in left shoulder: Secondary | ICD-10-CM

## 2022-01-08 NOTE — Therapy (Signed)
OUTPATIENT PHYSICAL THERAPY TREATMENT NOTE   Patient Name: Emily Wolfe MRN: [id number] DOB:Mar 21, 1955, 67 y.o., female Today's Date: 01/08/2022   PCP: Vivi Barrack, MD   REFERRING PROVIDER: Gregor Hams, MD  END OF SESSION:   PT End of Session - 01/08/22 1304     Visit Number 8    Number of Visits 16    Date for PT Re-Evaluation 01/26/22    Authorization Type [employer]- VL med necc    PT Start Time 4174    PT Stop Time 1342    PT Time Calculation (min) 38 min             Past Medical History:  Diagnosis Date   Anemia    Angio-edema    Back pain    Bilateral swelling of feet    Cancer ([place])    Chronic pain of both upper extremities    Eczema    Gallbladder problem    Genital warts 11/19/2017   GERD (gastroesophageal reflux disease)    Heartburn    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   History of ovarian cancer 11/19/2017   History of stomach ulcers    Hyperlipidemia 06/29/2017   Hypertension 06/29/2017   Infection 10/2007   From hysterectomy sutures rupturing   Inguinal hernia    Joint pain    Lactose intolerance    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   Mild intermittent asthma without complication 08/18/4816   Multiple sclerosis ([place]) 06/29/2017   Neuromuscular disorder ([place])    Osteoarthritis    Palpitations    Postmenopausal 06/29/2017   Scoliosis of lumbar spine 06/29/2017   Small bowel obstruction ([place]) 06/29/2017   Swallowing difficulty    Umbilical hernia    Urticaria    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   Vitamin D deficiency 06/29/2017   Past Surgical History:  Procedure Laterality Date   ADENOIDECTOMY     CHOLECYSTECTOMY  2009   HERNIA REPAIR     HERNIA REPAIR     6/10, 9/10   HYSTERECTOMY ABDOMINAL WITH SALPINGO-OOPHORECTOMY     HYSTEROTOMY  2008   LAPAROSCOPIC LYSIS OF ADHESIONS     , With small bowel resection and ventral hernia repair with mesh 2019/[place]   MOHS  SURGERY     SINOSCOPY     VARICOSE VEIN SURGERY     Patient Active Problem List   Diagnosis Date Noted   S/P lumbar fusion 05/19/2021   Primary osteoarthritis of right hip 12/27/2020   Osteoarthritis 11/22/2020   Dehydration 08/31/2020   Class 1 obesity with serious comorbidity and body mass index (BMI) of 30.0 to 30.9 in adult 01/26/2020   SBO (small bowel obstruction) ([place]) 03/21/2019   Nightmares 03/07/2019   Anxiety 12/20/2018   Cervicalgia 08/18/2018   Cough variant asthma vs UACS 01/03/2018   History of total hysterectomy, due to ovarian cancer 12/26/2017   Genital warts 11/19/2017   History of ovarian cancer 11/19/2017   Seasonal and perennial allergic rhinitis, followed by Allergist, treated with saline rinses and allergy shots 11/02/2017   Mild intermittent asthma without complication 56/31/4970   Hyperlipidemia 06/29/2017   Essential hypertension 06/29/2017   MS (multiple sclerosis) ([place]) 06/29/2017   Scoliosis of lumbar spine 06/29/2017   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   Vitamin D deficiency 06/29/2017   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   Lung mass 06/29/2017   Colon abnormality 05/11/2017  THERAPY DIAG:  Chronic pain of both shoulders  Muscle weakness (generalized)  Other low back pain    REFERRING DIAG: M25.511,G89.29,M25.512 (ICD-10-CM) - Chronic pain of both shoulders M54.50,G89.29 (ICD-10-CM) - Chronic bilateral low back pain without sciatica     Rationale for Evaluation and Treatment: Rehabilitation   ONSET DATE: earlier this year for back pain, and 2 years ago for shoulders.    SUBJECTIVE:                                                                                                                                                                                       SUBJECTIVE STATEMENT: 01/08/2022 States she did piliates this morning and it was really good/challenging. States she is about the  same. States she has been doing some of her exercises.     PERTINENT HISTORY: MS, lumbar spinal fusion L3/4 05/19/21, hx of ovarian cancer and 8 gut surgeries. Hernias, foot drop on right (when fatigued), fainting spells   PAIN:  Are you having pain? Yes: NPRS scale: 2/10 Pain location: LBP, shoulders are 1/10 Pain description:stiffness, achiness     PRECAUTIONS: None   WEIGHT BEARING RESTRICTIONS: No   FALLS:  Has patient fallen in last 6 months? 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    NEXT MD VISIT:    OBJECTIVE:    DIAGNOSTIC FINDINGS:  right shoulder xray IMPRESSION: Diffuse osteopenia. Acromioclavicular and glenohumeral degenerative change. Subacromial spurring. A calcific density is noted over the proximal right humerus, this could be related to calcific tendinosis or a loose body. No acute bony abnormality identified.   Left shoulder xray IMPRESSION: Diffuse osteopenia. Acromioclavicular and glenohumeral degenerative change. Mild subacromial spurring. No acute abnormality identified.        POSTURE: Slumped forward postured, limited hip/lumbar extension with walking. Forward head, rounded shoulders, sacral sitting   ROM Neck motion limited in all directions but no pain with motion                        UE Measurements       Upper Extremity Right 12/23/2021 Left 12/23/2021    A/PROM MMT A/PROM MMT  Shoulder Flexion 160** 4-* 150** 4-*  Shoulder Extension          Shoulder Abduction WFL - scaption**   WFL - Scaption**    Shoulder Adduction          Shoulder Internal  Rotation Reaches to L2  SP* 3+  Reaches to L1 SP* 3+  Shoulder External Rotation Reaches to C7 SP 4 Reaches to C7 SP 4  Elbow Flexion          Elbow Extension          Wrist Flexion          Wrist Extension           Wrist Supination          Wrist Pronation          Wrist Ulnar Deviation          Wrist Radial Deviation          Grip Strength NA   NA                          (Blank rows = not tested)                       * pain/tightness **  difficult to perform movement Pt is right handed   Transitional Movements: STS - Heavy uses of B UE to transition and labored effort  TODAY'S TREATMENT:                                                                                                                                         DATE:  01/08/2022 Therapeutic Exercise:    Aerobic: Supine: bridges with UE x30 5"holds Prone:lying over 2 pillows-1 pillow - no pillows. Hamstring curls 6 minutes, heel press x15 5" holds, hip ext - difficult - stopped    Seated:  pulleys scaption 3 minutes, abd 3 minutes   Neuromuscular Re-education:   Manual Therapy: Therapeutic Activity: Self Care: Trigger Point Dry Needling:  Modalities:      PATIENT EDUCATION:  Education details: on HEP, on rationale for interventions Person educated: Patient Education method: Explanation, Demonstration, and Handouts Education comprehension: verbalized understanding     HOME EXERCISE PROGRAM: [id number]  ASSESSMENT:   CLINICAL IMPRESSION: 01/08/2022 Session focused on progression of exercises. Discussed transitioning back to table exercises and patient agreeable. Educated patient in importance of performing supine and prone exercises to tolerance. Tolerated exercises well with no increase in pain during or after exercises.    OBJECTIVE IMPAIRMENTS: decreased activity tolerance, decreased mobility, difficulty walking, decreased ROM, decreased strength, impaired UE functional use, improper body mechanics, postural dysfunction, and pain.    ACTIVITY LIMITATIONS: carrying, lifting, sitting, standing, transfers, reach over head, and locomotion level   PARTICIPATION LIMITATIONS: community activity, occupation, and yard  work   PERSONAL FACTORS: Age, Fitness, Past/current experiences, and 3+ comorbidities: multiple abdominal surgeries, lumbar fusion L3/4  are also affecting patient's functional outcome.    REHAB POTENTIAL: Good   CLINICAL DECISION MAKING: Evolving/moderate complexity   EVALUATION COMPLEXITY: Moderate     GOALS: Goals reviewed with patient?  yes   SHORT TERM GOALS: Target date: 12/30/2021  Patient will be independent in self management strategies to improve quality of life and functional outcomes. Baseline: New Program Goal status: PROGRESSING   2.  Patient will report at least 25% improvement in overall symptoms and/or function to demonstrate improved functional mobility Baseline: 0% better Goal status: PROGRESSING   3.  Patient will reporting modifying heights of regularly used chairs at home with cushions to improve posture while sitting at home  Baseline:  Goal status: MET           LONG TERM GOALS: Target date: 01/26/2022     Patient will report at least 50% improvement in overall symptoms and/or function to demonstrate improved functional mobility Baseline: 0% better Goal status: PROGRESSING   2.  Patient will be able to demonstrate at least 160 degrees of B shoulder ROM to improve overhead reaching Baseline:  Goal status: PROGRESSING   3.  Patient will be able to sit in active sitting posture for a least 2 minutes to demonstrate improved postural strength. Baseline:  Goal status: MET     PLAN:   PT FREQUENCY: 2x/week   PT DURATION: 8 weeks   PLANNED INTERVENTIONS: Therapeutic exercises, Therapeutic activity, Neuromuscular re-education, Balance training, Gait training, Patient/Family education, Self Care, Joint mobilization, Joint manipulation, DME instructions, Dry Needling, Electrical stimulation, Cryotherapy, Moist heat, Ionotophoresis 78m/ml Dexamethasone, Manual therapy, and Re-evaluation   PLAN FOR NEXT SESSION:    posture, pelvic tilts, ROM, prone lying  with pillows   1:42 PM, 01/08/22 MJerene Pitch DPT Physical Therapy with [facility] Sinner

## 2022-01-13 ENCOUNTER — Ambulatory Visit: Payer: Medicare Other | Admitting: Physical Therapy

## 2022-01-13 ENCOUNTER — Encounter: Payer: Self-pay | Admitting: Physical Therapy

## 2022-01-13 DIAGNOSIS — G8929 Other chronic pain: Secondary | ICD-10-CM | POA: Diagnosis not present

## 2022-01-13 DIAGNOSIS — M5459 Other low back pain: Secondary | ICD-10-CM

## 2022-01-13 DIAGNOSIS — M25512 Pain in left shoulder: Secondary | ICD-10-CM

## 2022-01-13 DIAGNOSIS — M6281 Muscle weakness (generalized): Secondary | ICD-10-CM | POA: Diagnosis not present

## 2022-01-13 DIAGNOSIS — M25511 Pain in right shoulder: Secondary | ICD-10-CM

## 2022-01-13 NOTE — Therapy (Signed)
OUTPATIENT PHYSICAL THERAPY TREATMENT NOTE   Patient Name: Emily Wolfe MRN: [id number] DOB:1955/08/19, 67 y.o., female Today's Date: 01/13/2022   PCP: Vivi Barrack, MD   REFERRING PROVIDER: Gregor Hams, MD  END OF SESSION:   PT End of Session - 01/13/22 1100     Visit Number 9    Number of Visits 16    Date for PT Re-Evaluation 01/26/22    Authorization Type [employer]- VL med necc    PT Start Time 1104    PT Stop Time 1142    PT Time Calculation (min) 38 min             Past Medical History:  Diagnosis Date   Anemia    Angio-edema    Back pain    Bilateral swelling of feet    Cancer ([place])    Chronic pain of both upper extremities    Eczema    Gallbladder problem    Genital warts 11/19/2017   GERD (gastroesophageal reflux disease)    Heartburn    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   History of ovarian cancer 11/19/2017   History of stomach ulcers    Hyperlipidemia 06/29/2017   Hypertension 06/29/2017   Infection 10/2007   From hysterectomy sutures rupturing   Inguinal hernia    Joint pain    Lactose intolerance    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   Mild intermittent asthma without complication 95/18/8416   Multiple sclerosis ([place]) 06/29/2017   Neuromuscular disorder ([place])    Osteoarthritis    Palpitations    Postmenopausal 06/29/2017   Scoliosis of lumbar spine 06/29/2017   Small bowel obstruction ([place]) 06/29/2017   Swallowing difficulty    Umbilical hernia    Urticaria    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   Vitamin D deficiency 06/29/2017   Past Surgical History:  Procedure Laterality Date   ADENOIDECTOMY     CHOLECYSTECTOMY  2009   HERNIA REPAIR     HERNIA REPAIR     6/10, 9/10   HYSTERECTOMY ABDOMINAL WITH SALPINGO-OOPHORECTOMY     HYSTEROTOMY  2008   LAPAROSCOPIC LYSIS OF ADHESIONS     , With small bowel resection and ventral hernia repair with mesh 2019/[place]   MOHS  SURGERY     SINOSCOPY     VARICOSE VEIN SURGERY     Patient Active Problem List   Diagnosis Date Noted   S/P lumbar fusion 05/19/2021   Primary osteoarthritis of right hip 12/27/2020   Osteoarthritis 11/22/2020   Dehydration 08/31/2020   Class 1 obesity with serious comorbidity and body mass index (BMI) of 30.0 to 30.9 in adult 01/26/2020   SBO (small bowel obstruction) ([place]) 03/21/2019   Nightmares 03/07/2019   Anxiety 12/20/2018   Cervicalgia 08/18/2018   Cough variant asthma vs UACS 01/03/2018   History of total hysterectomy, due to ovarian cancer 12/26/2017   Genital warts 11/19/2017   History of ovarian cancer 11/19/2017   Seasonal and perennial allergic rhinitis, followed by Allergist, treated with saline rinses and allergy shots 11/02/2017   Mild intermittent asthma without complication 60/63/0160   Hyperlipidemia 06/29/2017   Essential hypertension 06/29/2017   MS (multiple sclerosis) ([place]) 06/29/2017   Scoliosis of lumbar spine 06/29/2017   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   Vitamin D deficiency 06/29/2017   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   Lung mass 06/29/2017   Colon abnormality 05/11/2017  THERAPY DIAG:  Chronic pain of both shoulders  Muscle weakness (generalized)  Other low back pain    REFERRING DIAG: M25.511,G89.29,M25.512 (ICD-10-CM) - Chronic pain of both shoulders M54.50,G89.29 (ICD-10-CM) - Chronic bilateral low back pain without sciatica     Rationale for Evaluation and Treatment: Rehabilitation   ONSET DATE: earlier this year for back pain, and 2 years ago for shoulders.    SUBJECTIVE:                                                                                                                                                                                       SUBJECTIVE STATEMENT: 01/13/2022 States she feels beat up and doesn't know if it is the weather or the pilates class. States that her  back bothered her after her class yesterday.     PERTINENT HISTORY: MS, lumbar spinal fusion L3/4 05/19/21, hx of ovarian cancer and 8 gut surgeries. Hernias, foot drop on right (when fatigued), fainting spells   PAIN:  Are you having pain? Yes: NPRS scale: 2/10 Pain location: LBP, shoulders are 1/10 Pain description:stiffness, achiness     PRECAUTIONS: None   WEIGHT BEARING RESTRICTIONS: No   FALLS:  Has patient fallen in last 6 months? 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    NEXT MD VISIT:    OBJECTIVE:    DIAGNOSTIC FINDINGS:  right shoulder xray IMPRESSION: Diffuse osteopenia. Acromioclavicular and glenohumeral degenerative change. Subacromial spurring. A calcific density is noted over the proximal right humerus, this could be related to calcific tendinosis or a loose body. No acute bony abnormality identified.   Left shoulder xray IMPRESSION: Diffuse osteopenia. Acromioclavicular and glenohumeral degenerative change. Mild subacromial spurring. No acute abnormality identified.        POSTURE: Slumped forward postured, limited hip/lumbar extension with walking. Forward head, rounded shoulders, sacral sitting   ROM Neck motion limited in all directions but no pain with motion                        UE Measurements       Upper Extremity Right 12/23/2021 Left 12/23/2021    A/PROM MMT A/PROM MMT  Shoulder Flexion 160** 4-* 150** 4-*  Shoulder Extension          Shoulder Abduction WFL - scaption**   WFL - Scaption**    Shoulder Adduction          Shoulder  Internal Rotation Reaches to L2  SP* 3+  Reaches to L1 SP* 3+  Shoulder External Rotation Reaches to C7 SP 4 Reaches to C7 SP 4  Elbow Flexion          Elbow Extension          Wrist Flexion          Wrist Extension           Wrist Supination          Wrist Pronation          Wrist Ulnar Deviation          Wrist Radial Deviation          Grip Strength NA   NA                          (Blank rows = not tested)                       * pain/tightness **  difficult to perform movement Pt is right handed   Transitional Movements: STS - Heavy uses of B UE to transition and labored effort  TODAY'S TREATMENT:                                                                                                                                         DATE:  01/13/2022 Therapeutic Exercise:    Aerobic: Supine: bridges with UE x30 5"holds Prone:lying on stomach - reviewed with leg lift    Seated:  pulleys scaption 3 minutes, abd 3 minutes, Shoulder flexion with add 2 minutes B, horizontal shoulder abd YTB 2 minutes B, shoulder ER YTB 2 minutes, pelvic tilts - difficult for patient 2 minutes anterior/posterior Standing: shoulder ball roll up wall 2 minutes, side to side ball rolls at wall 2 minutes, shoulder extension with dowel 3 minutes - focus on scap retraction, back at wall with YTB shoulder extension 2 minutes alternating  Neuromuscular Re-education:   Manual Therapy: Therapeutic Activity: Self Care: Trigger Point Dry Needling:  Modalities:      PATIENT EDUCATION:  Education details: on HEP, on rationale for interventions Person educated: Patient Education method: Explanation, Demonstration, and Handouts Education comprehension: verbalized understanding     HOME EXERCISE PROGRAM: [id number]  ASSESSMENT:   CLINICAL IMPRESSION: 01/13/2022 Encouraged continued prone lying and prone exercises at home. Used time sets to help cue patient to task at hand. Fatigue and achiness noted in shoulders after today's session but patient reported this is normal for her. Will conitnue with shoulder mobilityu and use of wall to assist with posture. Trunk flexion and low back pain greatest limitation at this time.   OBJECTIVE  IMPAIRMENTS: decreased activity tolerance, decreased mobility, difficulty walking, decreased ROM, decreased strength, impaired UE functional use, improper body mechanics, postural dysfunction, and pain.    ACTIVITY LIMITATIONS:  carrying, lifting, sitting, standing, transfers, reach over head, and locomotion level   PARTICIPATION LIMITATIONS: community activity, occupation, and yard work   PERSONAL FACTORS: Age, Fitness, Past/current experiences, and 3+ comorbidities: multiple abdominal surgeries, lumbar fusion L3/4  are also affecting patient's functional outcome.    REHAB POTENTIAL: Good   CLINICAL DECISION MAKING: Evolving/moderate complexity   EVALUATION COMPLEXITY: Moderate     GOALS: Goals reviewed with patient? yes   SHORT TERM GOALS: Target date: 12/30/2021  Patient will be independent in self management strategies to improve quality of life and functional outcomes. Baseline: New Program Goal status: PROGRESSING   2.  Patient will report at least 25% improvement in overall symptoms and/or function to demonstrate improved functional mobility Baseline: 0% better Goal status: PROGRESSING   3.  Patient will reporting modifying heights of regularly used chairs at home with cushions to improve posture while sitting at home  Baseline:  Goal status: MET           LONG TERM GOALS: Target date: 01/26/2022     Patient will report at least 50% improvement in overall symptoms and/or function to demonstrate improved functional mobility Baseline: 0% better Goal status: PROGRESSING   2.  Patient will be able to demonstrate at least 160 degrees of B shoulder ROM to improve overhead reaching Baseline:  Goal status: PROGRESSING   3.  Patient will be able to sit in active sitting posture for a least 2 minutes to demonstrate improved postural strength. Baseline:  Goal status: MET     PLAN:   PT FREQUENCY: 2x/week   PT DURATION: 8 weeks   PLANNED INTERVENTIONS: Therapeutic  exercises, Therapeutic activity, Neuromuscular re-education, Balance training, Gait training, Patient/Family education, Self Care, Joint mobilization, Joint manipulation, DME instructions, Dry Needling, Electrical stimulation, Cryotherapy, Moist heat, Ionotophoresis '4mg'$ /ml Dexamethasone, Manual therapy, and Re-evaluation   PLAN FOR NEXT SESSION:    posture, pelvic tilts, ROM, prone lying with pillows   11:42 AM, 01/13/22 Jerene Pitch, DPT Physical Therapy with [facility]

## 2022-01-15 ENCOUNTER — Encounter: Payer: Self-pay | Admitting: Physical Therapy

## 2022-01-15 ENCOUNTER — Ambulatory Visit: Payer: Medicare Other | Admitting: Physical Therapy

## 2022-01-15 DIAGNOSIS — M25512 Pain in left shoulder: Secondary | ICD-10-CM | POA: Diagnosis not present

## 2022-01-15 DIAGNOSIS — M25511 Pain in right shoulder: Secondary | ICD-10-CM

## 2022-01-15 DIAGNOSIS — G8929 Other chronic pain: Secondary | ICD-10-CM | POA: Diagnosis not present

## 2022-01-15 DIAGNOSIS — M5459 Other low back pain: Secondary | ICD-10-CM

## 2022-01-15 DIAGNOSIS — M6281 Muscle weakness (generalized): Secondary | ICD-10-CM | POA: Diagnosis not present

## 2022-01-15 NOTE — Therapy (Signed)
OUTPATIENT PHYSICAL THERAPY TREATMENT NOTE Progress Note Reporting Period 12/01/21 to 01/15/22  See note below for Objective Data and Assessment of Progress/Goals.      Patient Name: Emily Wolfe MRN: [id number] DOB:[birth year], 67 y.o., female Today's Date: 01/15/2022   PCP: Vivi Barrack, MD   REFERRING PROVIDER: Gregor Hams, MD  END OF SESSION:   PT End of Session - 01/15/22 1057     Visit Number 10    Number of Visits 16    Date for PT Re-Evaluation 01/26/22    Authorization Type UHC- VL med necc    Progress Note Due on Visit 20    PT Start Time 1100    PT Stop Time 1140    PT Time Calculation (min) 40 min             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   History of ovarian cancer 11/19/2017   History of stomach ulcers    Hyperlipidemia 06/29/2017   Hypertension 06/29/2017   Infection 10/2007   From hysterectomy sutures rupturing   Inguinal hernia    Joint pain    Lactose intolerance    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   Mild intermittent asthma without complication 24/58/0998   Multiple sclerosis ([place]) 06/29/2017   Neuromuscular disorder ([place])    Osteoarthritis    Palpitations    Postmenopausal 06/29/2017   Scoliosis of lumbar spine 06/29/2017   Small bowel obstruction ([place]) 06/29/2017   Swallowing difficulty    Umbilical hernia    Urticaria    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   Vitamin D deficiency 06/29/2017   Past Surgical History:  Procedure Laterality Date   ADENOIDECTOMY     CHOLECYSTECTOMY  2009   HERNIA REPAIR     HERNIA REPAIR     6/10, 9/10   HYSTERECTOMY ABDOMINAL WITH  SALPINGO-OOPHORECTOMY     HYSTEROTOMY  2008   LAPAROSCOPIC LYSIS OF ADHESIONS     , With small bowel resection and ventral hernia repair with mesh 2019/[place]   MOHS SURGERY     SINOSCOPY     VARICOSE VEIN SURGERY     Patient Active Problem List   Diagnosis Date Noted   S/P lumbar fusion 05/19/2021   Primary osteoarthritis of right hip 12/27/2020   Osteoarthritis 11/22/2020   Dehydration 08/31/2020   Class 1 obesity with serious comorbidity and body mass index (BMI) of 30.0 to 30.9 in adult 01/26/2020   SBO (small bowel obstruction) ([place]) 03/21/2019   Nightmares 03/07/2019   Anxiety 12/20/2018   Cervicalgia 08/18/2018   Cough variant asthma vs UACS 01/03/2018   History of total hysterectomy, due to ovarian cancer 12/26/2017   Genital warts 11/19/2017   History of ovarian cancer 11/19/2017   Seasonal and perennial allergic rhinitis, followed by Allergist, treated with saline rinses and allergy shots 11/02/2017   Mild intermittent asthma without complication 33/82/5053   Hyperlipidemia 06/29/2017   Essential hypertension 06/29/2017   MS (multiple sclerosis) ([place]) 06/29/2017   Scoliosis of lumbar spine 06/29/2017   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  Vitamin D deficiency 06/29/2017   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   Lung mass 06/29/2017   Colon abnormality 05/11/2017    THERAPY DIAG:  Chronic pain of both shoulders  Muscle weakness (generalized)  Other low back pain    REFERRING DIAG: M25.511,G89.29,M25.512 (ICD-10-CM) - Chronic pain of both shoulders M54.50,G89.29 (ICD-10-CM) - Chronic bilateral low back pain without sciatica     Rationale for Evaluation and Treatment: Rehabilitation   ONSET DATE: earlier this year for back pain, and 2 years ago for shoulders.    SUBJECTIVE:                                                                                                                                                                                        SUBJECTIVE STATEMENT: 01/15/2022 States she is having the typical low pain, states she felt like her shoulders were worse yesterday. States she doesn't really pay much attention to her shoulders. States she doesn't feel like her shoulders are much better but they are a little bit better, reports she feels 10-20% better.     PERTINENT HISTORY: MS, lumbar spinal fusion L3/4 05/19/21, hx of ovarian cancer and 8 gut surgeries. Hernias, foot drop on right (when fatigued), fainting spells   PAIN:  Are you having pain? Yes: NPRS scale: 1-2/10 - LBP Pain location: shoulders are 1/10 Pain description:stiffness, achiness     PRECAUTIONS: None   WEIGHT BEARING RESTRICTIONS: No   FALLS:  Has patient fallen in last 6 months? 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    NEXT MD VISIT:    OBJECTIVE:    DIAGNOSTIC FINDINGS:  right shoulder xray IMPRESSION: Diffuse osteopenia. Acromioclavicular and glenohumeral degenerative change. Subacromial spurring. A calcific density is noted over the proximal right humerus, this could be related to calcific tendinosis or a loose body. No acute bony abnormality identified.   Left shoulder xray IMPRESSION: Diffuse osteopenia. Acromioclavicular and glenohumeral degenerative change. Mild subacromial spurring. No acute abnormality identified.        POSTURE: Slumped forward postured, limited hip/lumbar extension with walking. Forward head, rounded shoulders, sacral sitting   ROM Neck motion limited in all directions but no pain with motion                        UE Measurements       Upper Extremity AROM/PROM Right 01/15/22  Left 01/15/22    A/PROM MMT A/PROM MMT  Shoulder Flexion 150/165** 4 150/165** 4  Shoulder Extension           Shoulder Abduction WFL - scaption**   WFL - Scaption**    Shoulder Adduction          Shoulder Internal Rotation Reaches to L1  SP 4  Reaches to L1 SP 4  Shoulder External Rotation Reaches to C7 SP 4+ Reaches to C7 SP 4+  Elbow Flexion          Elbow Extension          Wrist Flexion          Wrist Extension          Wrist Supination          Wrist Pronation          Wrist Ulnar Deviation          Wrist Radial Deviation          Grip Strength NA   NA                          (Blank rows = not tested)                       * pain/tightness **  difficult to perform movement Pt is right handed   Transitional Movements: STS - Heavy uses of B UE to transition and labored effort  TODAY'S TREATMENT:                                                                                                                                         DATE:  01/15/2022 Therapeutic Exercise:    Aerobic: Supine:   Prone:     Seated:  Standing: shoulder flexion up wall 2 minutes, review of HEP, discussed different exercises to perform in pool, back at wall shoulder abd 2 minutes total B, shoulder extension 5" holds 2 minutes total B  Neuromuscular Re-education:   Manual Therapy: Therapeutic Activity: Self Care: Trigger Point Dry Needling:  Modalities:      PATIENT EDUCATION:  Education details: on HEP, on rationale for interventions, on progress made, on possible benefits of aquatic therapy  for low back pain Person educated: Patient Education method: Explanation, Demonstration, and Handouts Education comprehension: verbalized understanding     HOME EXERCISE PROGRAM: [id number]  ASSESSMENT:   CLINICAL IMPRESSION: 01/15/2022 Overall patient making some progress in strength but continues to have pain and limitations in ROM. Discussed back contributing to current condition and pain in back limiting participation in shoulder exercises. Discussed possible benefits of aquatic therapy. Patient with  membership to [facility] and discussed trying to float in the pool with pool noodles and walking in chest deep water to see how she tolerates it. Discussed finishing up current POC to establish HEP  for patient to perform at home.   OBJECTIVE IMPAIRMENTS: decreased activity tolerance, decreased mobility, difficulty walking, decreased ROM, decreased strength, impaired UE functional use, improper body mechanics, postural dysfunction, and pain.    ACTIVITY LIMITATIONS: carrying, lifting, sitting, standing, transfers, reach over head, and locomotion level   PARTICIPATION LIMITATIONS: community activity, occupation, and yard work   PERSONAL FACTORS: Age, Fitness, Past/current experiences, and 3+ comorbidities: multiple abdominal surgeries, lumbar fusion L3/4  are also affecting patient's functional outcome.    REHAB POTENTIAL: Good   CLINICAL DECISION MAKING: Evolving/moderate complexity   EVALUATION COMPLEXITY: Moderate     GOALS: Goals reviewed with patient? yes   SHORT TERM GOALS: Target date: 12/30/2021  Patient will be independent in self management strategies to improve quality of life and functional outcomes. Baseline: New Program Goal status: PROGRESSING   2.  Patient will report at least 25% improvement in overall symptoms and/or function to demonstrate improved functional mobility Baseline: 0% better Goal status: PROGRESSING   3.  Patient will reporting modifying heights of regularly used chairs at home with cushions to improve posture while sitting at home  Baseline:  Goal status: MET           LONG TERM GOALS: Target date: 01/26/2022     Patient will report at least 50% improvement in overall symptoms and/or function to demonstrate improved functional mobility Baseline: 0% better Goal status: PROGRESSING   2.  Patient will be able to demonstrate at least 160 degrees of B shoulder ROM to improve overhead reaching Baseline:  Goal status: PARTIALLY MET   3.  Patient  will be able to sit in active sitting posture for a least 2 minutes to demonstrate improved postural strength. Baseline:  Goal status: MET     PLAN:   PT FREQUENCY: 2x/week   PT DURATION: 8 weeks   PLANNED INTERVENTIONS: Therapeutic exercises, Therapeutic activity, Neuromuscular re-education, Balance training, Gait training, Patient/Family education, Self Care, Joint mobilization, Joint manipulation, DME instructions, Dry Needling, Electrical stimulation, Cryotherapy, Moist heat, Ionotophoresis '4mg'$ /ml Dexamethasone, Manual therapy, and Re-evaluation   PLAN FOR NEXT SESSION:    posture, pelvic tilts, ROM, prone lying, shoulder ROM/MMT, review of aquatic interventions if has questions   11:40 AM, 01/15/22 Jerene Pitch, DPT Physical Therapy with [facility]

## 2022-01-16 ENCOUNTER — Ambulatory Visit (INDEPENDENT_AMBULATORY_CARE_PROVIDER_SITE_OTHER): Payer: Medicare Other

## 2022-01-16 DIAGNOSIS — J309 Allergic rhinitis, unspecified: Secondary | ICD-10-CM

## 2022-01-19 NOTE — Therapy (Unsigned)
OUTPATIENT PHYSICAL THERAPY TREATMENT NOTE Progress Note Reporting Period 12/01/21 to 01/15/22  See note below for Objective Data and Assessment of Progress/Goals.      Patient Name: Emily Wolfe MRN: [id number] DOB:September 30, 1955, 67 y.o., female Today's Date: 01/19/2022   PCP: Vivi Barrack, MD   REFERRING PROVIDER: Gregor Hams, MD  END OF SESSION:     Past Medical History:  Diagnosis Date   Anemia    Angio-edema    Back pain    Bilateral swelling of feet    Cancer ([place])    Chronic pain of both upper extremities    Eczema    Gallbladder problem    Genital warts 11/19/2017   GERD (gastroesophageal reflux disease)    Heartburn    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   History of ovarian cancer 11/19/2017   History of stomach ulcers    Hyperlipidemia 06/29/2017   Hypertension 06/29/2017   Infection 10/2007   From hysterectomy sutures rupturing   Inguinal hernia    Joint pain    Lactose intolerance    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   Mild intermittent asthma without complication 92/33/0076   Multiple sclerosis ([place]) 06/29/2017   Neuromuscular disorder ([place])    Osteoarthritis    Palpitations    Postmenopausal 06/29/2017   Scoliosis of lumbar spine 06/29/2017   Small bowel obstruction ([place]) 06/29/2017   Swallowing difficulty    Umbilical hernia    Urticaria    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   Vitamin D deficiency 06/29/2017   Past Surgical History:  Procedure Laterality Date   ADENOIDECTOMY     CHOLECYSTECTOMY  2009   HERNIA REPAIR     HERNIA REPAIR     6/10, 9/10   HYSTERECTOMY ABDOMINAL WITH SALPINGO-OOPHORECTOMY     HYSTEROTOMY  2008   LAPAROSCOPIC LYSIS OF ADHESIONS     , With small bowel resection and ventral hernia repair with mesh 2019/[place]   MOHS SURGERY     SINOSCOPY     VARICOSE VEIN SURGERY     Patient Active Problem List   Diagnosis Date Noted   S/P lumbar fusion  05/19/2021   Primary osteoarthritis of right hip 12/27/2020   Osteoarthritis 11/22/2020   Dehydration 08/31/2020   Class 1 obesity with serious comorbidity and body mass index (BMI) of 30.0 to 30.9 in adult 01/26/2020   SBO (small bowel obstruction) ([place]) 03/21/2019   Nightmares 03/07/2019   Anxiety 12/20/2018   Cervicalgia 08/18/2018   Cough variant asthma vs UACS 01/03/2018   History of total hysterectomy, due to ovarian cancer 12/26/2017   Genital warts 11/19/2017   History of ovarian cancer 11/19/2017   Seasonal and perennial allergic rhinitis, followed by Allergist, treated with saline rinses and allergy shots 11/02/2017   Mild intermittent asthma without complication 22/63/3354   Hyperlipidemia 06/29/2017   Essential hypertension 06/29/2017   MS (multiple sclerosis) ([place]) 06/29/2017   Scoliosis of lumbar spine 06/29/2017   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   Vitamin D deficiency 06/29/2017   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   Lung mass 06/29/2017   Colon abnormality 05/11/2017    THERAPY DIAG:  No diagnosis found.    REFERRING DIAG: M25.511,G89.29,M25.512 (ICD-10-CM) - Chronic pain of both shoulders M54.50,G89.29 (ICD-10-CM) - Chronic bilateral low back pain without sciatica     Rationale for Evaluation and Treatment: Rehabilitation   ONSET DATE: earlier this year for back pain, and  2 years ago for shoulders.    SUBJECTIVE:                                                                                                                                                                                       SUBJECTIVE STATEMENT: 01/19/2022 States she is having the typical low pain, states she felt like her shoulders were worse yesterday. States she doesn't really pay much attention to her shoulders. States she doesn't feel like her shoulders are much better but they are a little bit better, reports she feels 10-20% better.      PERTINENT HISTORY: MS, lumbar spinal fusion L3/4 05/19/21, hx of ovarian cancer and 8 gut surgeries. Hernias, foot drop on right (when fatigued), fainting spells   PAIN:  Are you having pain? Yes: NPRS scale: 1-2/10 - LBP Pain location: shoulders are 1/10 Pain description:stiffness, achiness     PRECAUTIONS: None   WEIGHT BEARING RESTRICTIONS: No   FALLS:  Has patient fallen in last 6 months? 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    NEXT MD VISIT:    OBJECTIVE:    DIAGNOSTIC FINDINGS:  right shoulder xray IMPRESSION: Diffuse osteopenia. Acromioclavicular and glenohumeral degenerative change. Subacromial spurring. A calcific density is noted over the proximal right humerus, this could be related to calcific tendinosis or a loose body. No acute bony abnormality identified.   Left shoulder xray IMPRESSION: Diffuse osteopenia. Acromioclavicular and glenohumeral degenerative change. Mild subacromial spurring. No acute abnormality identified.        POSTURE: Slumped forward postured, limited hip/lumbar extension with walking. Forward head, rounded shoulders, sacral sitting   ROM Neck motion limited in all directions but no pain with motion                        UE Measurements       Upper Extremity AROM/PROM Right 01/15/22 Left 01/15/22    A/PROM MMT A/PROM MMT  Shoulder Flexion 150/165** 4 150/165** 4  Shoulder Extension          Shoulder Abduction WFL - scaption**   WFL - Scaption**    Shoulder Adduction          Shoulder Internal Rotation Reaches to L1  SP 4  Reaches to L1 SP 4  Shoulder External Rotation Reaches to C7 SP 4+ Reaches to C7 SP 4+  Elbow Flexion  Elbow Extension          Wrist Flexion          Wrist Extension          Wrist Supination          Wrist  Pronation          Wrist Ulnar Deviation          Wrist Radial Deviation          Grip Strength NA   NA                          (Blank rows = not tested)                       * pain/tightness **  difficult to perform movement Pt is right handed   Transitional Movements: STS - Heavy uses of B UE to transition and labored effort  TODAY'S TREATMENT:                                                                                                                                         DATE:  01/19/2022 Therapeutic Exercise:    Aerobic: Supine:   Prone:     Seated:  Standing: shoulder flexion up wall 2 minutes, review of HEP, discussed different exercises to perform in pool, back at wall shoulder abd 2 minutes total B, shoulder extension 5" holds 2 minutes total B  Neuromuscular Re-education:   Manual Therapy: Therapeutic Activity: Self Care: Trigger Point Dry Needling:  Modalities:      PATIENT EDUCATION:  Education details: on HEP, on rationale for interventions, on progress made, on possible benefits of aquatic therapy  for low back pain Person educated: Patient Education method: Explanation, Demonstration, and Handouts Education comprehension: verbalized understanding     HOME EXERCISE PROGRAM: [id number]  ASSESSMENT:   CLINICAL IMPRESSION: 01/19/2022 Overall patient making some progress in strength but continues to have pain and limitations in ROM. Discussed back contributing to current condition and pain in back limiting participation in shoulder exercises. Discussed possible benefits of aquatic therapy. Patient with membership to [facility] and discussed trying to float in the pool with pool noodles and walking in chest deep water to see how she tolerates it. Discussed finishing up current POC to establish HEP for patient to perform at home.   OBJECTIVE IMPAIRMENTS: decreased activity tolerance, decreased mobility, difficulty walking, decreased ROM, decreased strength,  impaired UE functional use, improper body mechanics, postural dysfunction, and pain.    ACTIVITY LIMITATIONS: carrying, lifting, sitting, standing, transfers, reach over head, and locomotion level   PARTICIPATION LIMITATIONS: community activity, occupation, and yard work   PERSONAL FACTORS: Age, Fitness, Past/current experiences, and 3+ comorbidities: multiple abdominal surgeries, lumbar fusion L3/4  are also affecting patient's functional outcome.    REHAB POTENTIAL: Good   CLINICAL DECISION MAKING:  Evolving/moderate complexity   EVALUATION COMPLEXITY: Moderate     GOALS: Goals reviewed with patient? yes   SHORT TERM GOALS: Target date: 12/30/2021  Patient will be independent in self management strategies to improve quality of life and functional outcomes. Baseline: New Program Goal status: PROGRESSING   2.  Patient will report at least 25% improvement in overall symptoms and/or function to demonstrate improved functional mobility Baseline: 0% better Goal status: PROGRESSING   3.  Patient will reporting modifying heights of regularly used chairs at home with cushions to improve posture while sitting at home  Baseline:  Goal status: MET           LONG TERM GOALS: Target date: 01/26/2022     Patient will report at least 50% improvement in overall symptoms and/or function to demonstrate improved functional mobility Baseline: 0% better Goal status: PROGRESSING   2.  Patient will be able to demonstrate at least 160 degrees of B shoulder ROM to improve overhead reaching Baseline:  Goal status: PARTIALLY MET   3.  Patient will be able to sit in active sitting posture for a least 2 minutes to demonstrate improved postural strength. Baseline:  Goal status: MET     PLAN:   PT FREQUENCY: 2x/week   PT DURATION: 8 weeks   PLANNED INTERVENTIONS: Therapeutic exercises, Therapeutic activity, Neuromuscular re-education, Balance training, Gait training, Patient/Family education,  Self Care, Joint mobilization, Joint manipulation, DME instructions, Dry Needling, Electrical stimulation, Cryotherapy, Moist heat, Ionotophoresis '4mg'$ /ml Dexamethasone, Manual therapy, and Re-evaluation   PLAN FOR NEXT SESSION:    posture, pelvic tilts, ROM, prone lying, shoulder ROM/MMT, review of aquatic interventions if has questions   Rudi Heap PT, DPT 01/19/22  4:20 PM

## 2022-01-20 ENCOUNTER — Ambulatory Visit: Payer: Medicare Other | Admitting: Physical Therapy

## 2022-01-20 ENCOUNTER — Encounter: Payer: Self-pay | Admitting: Physical Therapy

## 2022-01-20 DIAGNOSIS — M5459 Other low back pain: Secondary | ICD-10-CM | POA: Diagnosis not present

## 2022-01-20 DIAGNOSIS — M6281 Muscle weakness (generalized): Secondary | ICD-10-CM

## 2022-01-20 DIAGNOSIS — G8929 Other chronic pain: Secondary | ICD-10-CM | POA: Diagnosis not present

## 2022-01-20 DIAGNOSIS — M25511 Pain in right shoulder: Secondary | ICD-10-CM

## 2022-01-20 DIAGNOSIS — M25512 Pain in left shoulder: Secondary | ICD-10-CM

## 2022-01-20 NOTE — Therapy (Signed)
OUTPATIENT PHYSICAL THERAPY TREATMENT NOTE   Patient Name: Emily Wolfe MRN: [id number] DOB:16-Sep-1955, 67 y.o., female Today's Date: 01/22/2022   PCP: Vivi Barrack, MD   REFERRING PROVIDER: Gregor Hams, MD  END OF SESSION:   PT End of Session - 01/22/22 1207     Visit Number 12    Number of Visits 16    Date for PT Re-Evaluation 01/26/22    Authorization Type [employer]- [facility] med necc    Progress Note Due on Visit 20    PT Start Time 1102    PT Stop Time 1143    PT Time Calculation (min) 41 min    Activity Tolerance Patient tolerated treatment well    Behavior During Therapy WFL for tasks assessed/performed               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   History of ovarian cancer 11/19/2017   History of stomach ulcers    Hyperlipidemia 06/29/2017   Hypertension 06/29/2017   Infection 10/2007   From hysterectomy sutures rupturing   Inguinal hernia    Joint pain    Lactose intolerance    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   Mild intermittent asthma without complication 38/25/0539   Multiple sclerosis ([place]) 06/29/2017   Neuromuscular disorder ([place])    Osteoarthritis    Palpitations    Postmenopausal 06/29/2017   Scoliosis of lumbar spine 06/29/2017   Small bowel obstruction ([place]) 06/29/2017   Swallowing difficulty    Umbilical hernia    Urticaria    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   Vitamin D deficiency 06/29/2017   Past Surgical History:  Procedure Laterality Date   ADENOIDECTOMY     CHOLECYSTECTOMY  2009   HERNIA REPAIR     HERNIA REPAIR     6/10, 9/10   HYSTERECTOMY ABDOMINAL WITH SALPINGO-OOPHORECTOMY      HYSTEROTOMY  2008   LAPAROSCOPIC LYSIS OF ADHESIONS     , With small bowel resection and ventral hernia repair with mesh 2019/[place]   MOHS SURGERY     SINOSCOPY     VARICOSE VEIN SURGERY     Patient Active Problem List   Diagnosis Date Noted   S/P lumbar fusion 05/19/2021   Primary osteoarthritis of right hip 12/27/2020   Osteoarthritis 11/22/2020   Dehydration 08/31/2020   Class 1 obesity with serious comorbidity and body mass index (BMI) of 30.0 to 30.9 in adult 01/26/2020   SBO (small bowel obstruction) ([place]) 03/21/2019   Nightmares 03/07/2019   Anxiety 12/20/2018   Cervicalgia 08/18/2018   Cough variant asthma vs UACS 01/03/2018   History of total hysterectomy, due to ovarian cancer 12/26/2017   Genital warts 11/19/2017   History of ovarian cancer 11/19/2017   Seasonal and perennial allergic rhinitis, followed by Allergist, treated with saline rinses and allergy shots 11/02/2017   Mild intermittent asthma without complication 76/73/4193   Hyperlipidemia 06/29/2017   Essential hypertension 06/29/2017   MS (multiple sclerosis) ([place]) 06/29/2017   Scoliosis of lumbar spine 06/29/2017   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  Vitamin D deficiency 06/29/2017   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   Lung mass 06/29/2017   Colon abnormality 05/11/2017    THERAPY DIAG:  Chronic pain of both shoulders  Muscle weakness (generalized)  Other low back pain    REFERRING DIAG: M25.511,G89.29,M25.512 (ICD-10-CM) - Chronic pain of both shoulders M54.50,G89.29 (ICD-10-CM) - Chronic bilateral low back pain without sciatica     Rationale for Evaluation and Treatment: Rehabilitation   ONSET DATE: earlier this year for back pain, and 2 years ago for shoulders.    SUBJECTIVE:                                                                                                                                                                                        SUBJECTIVE STATEMENT: Pt states that she wakes up at 4am and is tired this morning. She reports more pain with colder weather.     PERTINENT HISTORY: MS, lumbar spinal fusion L3/4 05/19/21, hx of ovarian cancer and 8 gut surgeries. Hernias, foot drop on right (when fatigued), fainting spells   PAIN:  Are you having pain? Yes: NPRS scale: 1-2/10 - LBP Pain location: shoulders are 1/10 Pain description:stiffness, achiness     PRECAUTIONS: None   WEIGHT BEARING RESTRICTIONS: No   FALLS:  Has patient fallen in last 6 months? 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    NEXT MD VISIT:    OBJECTIVE:    DIAGNOSTIC FINDINGS:  right shoulder xray IMPRESSION: Diffuse osteopenia. Acromioclavicular and glenohumeral degenerative change. Subacromial spurring. A calcific density is noted over the proximal right humerus, this could be related to calcific tendinosis or a loose body. No acute bony abnormality identified.   Left shoulder xray IMPRESSION: Diffuse osteopenia. Acromioclavicular and glenohumeral degenerative change. Mild subacromial spurring. No acute abnormality identified.        POSTURE: Slumped forward postured, limited hip/lumbar extension with walking. Forward head, rounded shoulders, sacral sitting   ROM Neck motion limited in all directions but no pain with motion                        UE Measurements       Upper Extremity AROM/PROM Right 01/15/22 Left 01/15/22    A/PROM MMT A/PROM MMT  Shoulder Flexion 150/165** 4 150/165** 4  Shoulder Extension          Shoulder Abduction  WFL - scaption**   WFL - Scaption**    Shoulder Adduction          Shoulder Internal Rotation Reaches to L1  SP 4  Reaches to L1 SP 4  Shoulder External Rotation Reaches to C7 SP 4+ Reaches to  C7 SP 4+  Elbow Flexion          Elbow Extension          Wrist Flexion          Wrist Extension          Wrist Supination          Wrist Pronation          Wrist Ulnar Deviation          Wrist Radial Deviation          Grip Strength NA   NA                          (Blank rows = not tested)                       * pain/tightness **  difficult to perform movement Pt is right handed   Transitional Movements: STS - Heavy uses of B UE to transition and labored effort  TODAY'S TREATMENT:                                                                                                                                         DATE:  01/22/2022 Therapeutic Exercise:    Aerobic: Supine: bridges with UE x30 5"holds, clams with RTB x30, marching with RTB x30, dead bugs x15 with VC for TrA control    Standing: Shoulder flexion with add 2 minutes B, horizontal shoulder abd YTB 2 minutes B,  Seated: pulleys scaption 3 minutes, abd 3 minutes      PATIENT EDUCATION:  Education details: on HEP, on rationale for interventions, on progress made, on possible benefits of aquatic therapy  for low back pain Person educated: Patient Education method: Explanation, Demonstration, and Handouts Education comprehension: verbalized understanding     HOME EXERCISE PROGRAM: [id number]  ASSESSMENT:   CLINICAL IMPRESSION: Session with continued focus on lumbar/ shoulder mobility, proximal hip and shoulder strengthening. Continued with core exercises today with pt requiring multiple cues for core control and demonstrates quick onset of fatigue. She would benefit from increased core stability to help with functional movements with less pain.    OBJECTIVE IMPAIRMENTS: decreased activity tolerance, decreased mobility, difficulty walking, decreased ROM, decreased strength, impaired UE functional use, improper body mechanics, postural dysfunction, and pain.    ACTIVITY LIMITATIONS: carrying, lifting, sitting,  standing, transfers, reach over head, and locomotion level   PARTICIPATION LIMITATIONS: community activity, occupation, and yard work   PERSONAL FACTORS: Age, Fitness, Past/current experiences, and 3+ comorbidities: multiple abdominal surgeries, lumbar fusion  L3/4  are also affecting patient's functional outcome.    REHAB POTENTIAL: Good   CLINICAL DECISION MAKING: Evolving/moderate complexity   EVALUATION COMPLEXITY: Moderate     GOALS: Goals reviewed with patient? yes   SHORT TERM GOALS: Target date: 12/30/2021  Patient will be independent in self management strategies to improve quality of life and functional outcomes. Baseline: New Program Goal status: PROGRESSING   2.  Patient will report at least 25% improvement in overall symptoms and/or function to demonstrate improved functional mobility Baseline: 0% better Goal status: PROGRESSING   3.  Patient will reporting modifying heights of regularly used chairs at home with cushions to improve posture while sitting at home  Baseline:  Goal status: MET           LONG TERM GOALS: Target date: 01/26/2022     Patient will report at least 50% improvement in overall symptoms and/or function to demonstrate improved functional mobility Baseline: 0% better Goal status: PROGRESSING   2.  Patient will be able to demonstrate at least 160 degrees of B shoulder ROM to improve overhead reaching Baseline:  Goal status: PARTIALLY MET   3.  Patient will be able to sit in active sitting posture for a least 2 minutes to demonstrate improved postural strength. Baseline:  Goal status: MET     PLAN:   PT FREQUENCY: 2x/week   PT DURATION: 8 weeks   PLANNED INTERVENTIONS: Therapeutic exercises, Therapeutic activity, Neuromuscular re-education, Balance training, Gait training, Patient/Family education, Self Care, Joint mobilization, Joint manipulation, DME instructions, Dry Needling, Electrical stimulation, Cryotherapy, Moist heat,  Ionotophoresis '4mg'$ /ml Dexamethasone, Manual therapy, and Re-evaluation   PLAN FOR NEXT SESSION:    posture, pelvic tilts, ROM, prone lying, shoulder ROM/MMT, review of aquatic interventions if has questions   Rudi Heap PT, DPT 01/22/22  12:08 PM

## 2022-01-22 ENCOUNTER — Ambulatory Visit: Payer: Medicare Other | Admitting: Physical Therapy

## 2022-01-22 ENCOUNTER — Encounter: Payer: Self-pay | Admitting: Physical Therapy

## 2022-01-22 DIAGNOSIS — M25512 Pain in left shoulder: Secondary | ICD-10-CM | POA: Diagnosis not present

## 2022-01-22 DIAGNOSIS — M25511 Pain in right shoulder: Secondary | ICD-10-CM | POA: Diagnosis not present

## 2022-01-22 DIAGNOSIS — M6281 Muscle weakness (generalized): Secondary | ICD-10-CM | POA: Diagnosis not present

## 2022-01-22 DIAGNOSIS — M5459 Other low back pain: Secondary | ICD-10-CM

## 2022-01-22 DIAGNOSIS — G8929 Other chronic pain: Secondary | ICD-10-CM

## 2022-01-23 ENCOUNTER — Ambulatory Visit (INDEPENDENT_AMBULATORY_CARE_PROVIDER_SITE_OTHER): Payer: Medicare Other

## 2022-01-23 DIAGNOSIS — J309 Allergic rhinitis, unspecified: Secondary | ICD-10-CM

## 2022-01-24 DIAGNOSIS — G35 Multiple sclerosis: Secondary | ICD-10-CM | POA: Diagnosis not present

## 2022-01-26 ENCOUNTER — Encounter: Payer: Self-pay | Admitting: Physical Therapy

## 2022-01-26 ENCOUNTER — Ambulatory Visit: Payer: Medicare Other | Admitting: Physical Therapy

## 2022-01-26 DIAGNOSIS — G8929 Other chronic pain: Secondary | ICD-10-CM

## 2022-01-26 DIAGNOSIS — M25512 Pain in left shoulder: Secondary | ICD-10-CM | POA: Diagnosis not present

## 2022-01-26 DIAGNOSIS — M5459 Other low back pain: Secondary | ICD-10-CM | POA: Diagnosis not present

## 2022-01-26 DIAGNOSIS — M25511 Pain in right shoulder: Secondary | ICD-10-CM | POA: Diagnosis not present

## 2022-01-26 DIAGNOSIS — M6281 Muscle weakness (generalized): Secondary | ICD-10-CM | POA: Diagnosis not present

## 2022-01-26 NOTE — Therapy (Signed)
OUTPATIENT PHYSICAL THERAPY TREATMENT NOTE PHYSICAL THERAPY DISCHARGE SUMMARY  Visits from Start of Care: 13  Current functional level related to goals / functional outcomes: See below   Remaining deficits: See below   Education / Equipment: See below   Patient agrees to discharge. Patient goals were partially met. Patient is being discharged due to lack of progress.     Patient Name: Emily Wolfe MRN: [id number] DOB:July 16, 1955, 67 y.o., female Today's Date: 01/26/2022   PCP: Vivi Barrack, MD   REFERRING PROVIDER: Gregor Hams, MD  END OF SESSION:   PT End of Session - 01/26/22 1232     Visit Number 13    Number of Visits 16    Date for PT Re-Evaluation 01/26/22    Authorization Type [employer]- [facility] med necc    Progress Note Due on Visit 20    PT Start Time 1234    PT Stop Time 8891    PT Time Calculation (min) 39 min    Activity Tolerance Patient tolerated treatment well    Behavior During Therapy WFL for tasks assessed/performed               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   History of ovarian cancer 11/19/2017   History of stomach ulcers    Hyperlipidemia 06/29/2017   Hypertension 06/29/2017   Infection 10/2007   From hysterectomy sutures rupturing   Inguinal hernia    Joint pain    Lactose intolerance    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   Mild intermittent asthma without complication 69/45/0388   Multiple sclerosis ([place]) 06/29/2017   Neuromuscular disorder ([place])    Osteoarthritis    Palpitations    Postmenopausal 06/29/2017   Scoliosis of lumbar spine 06/29/2017   Small bowel obstruction ([place]) 06/29/2017    Swallowing difficulty    Umbilical hernia    Urticaria    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   Vitamin D deficiency 06/29/2017   Past Surgical History:  Procedure Laterality Date   ADENOIDECTOMY     CHOLECYSTECTOMY  2009   HERNIA REPAIR     HERNIA REPAIR     6/10, 9/10   HYSTERECTOMY ABDOMINAL WITH SALPINGO-OOPHORECTOMY     HYSTEROTOMY  2008   LAPAROSCOPIC LYSIS OF ADHESIONS     , With small bowel resection and ventral hernia repair with mesh 2019/[place]   [place]     VARICOSE VEIN SURGERY     Patient Active Problem List   Diagnosis Date Noted   S/P lumbar fusion 05/19/2021   Primary osteoarthritis of right hip 12/27/2020   Osteoarthritis 11/22/2020   Dehydration 08/31/2020   Class 1 obesity with serious comorbidity and body mass index (BMI) of 30.0 to 30.9 in adult 01/26/2020   SBO (small bowel obstruction) ([place]) 03/21/2019   Nightmares 03/07/2019   Anxiety 12/20/2018   Cervicalgia 08/18/2018   Cough variant asthma vs UACS 01/03/2018   History of total hysterectomy, due to ovarian cancer 12/26/2017   Genital warts 11/19/2017   History of ovarian cancer 11/19/2017  Seasonal and perennial allergic rhinitis, followed by Allergist, treated with saline rinses and allergy shots 11/02/2017   Mild intermittent asthma without complication 64/40/3474   Hyperlipidemia 06/29/2017   Essential hypertension 06/29/2017   MS (multiple sclerosis) ([place]) 06/29/2017   Scoliosis of lumbar spine 06/29/2017   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   Vitamin D deficiency 06/29/2017   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   Lung mass 06/29/2017   Colon abnormality 05/11/2017    THERAPY DIAG:  Chronic pain of both shoulders  Muscle weakness (generalized)  Other low back pain    REFERRING DIAG: M25.511,G89.29,M25.512 (ICD-10-CM) - Chronic pain of both shoulders M54.50,G89.29 (ICD-10-CM) - Chronic bilateral low back  pain without sciatica     Rationale for Evaluation and Treatment: Rehabilitation   ONSET DATE: earlier this year for back pain, and 2 years ago for shoulders.    SUBJECTIVE:                                                                                                                                                                                       SUBJECTIVE STATEMENT: States the last few days her back has been bothering and she doesn't know why. States she has had to take a few extra tylenol for the pain and stiffness. Doesn't know if it has to do with the weather. States that laying in the MRI on Saturday also really bothered her.     PERTINENT HISTORY: MS, lumbar spinal fusion L3/4 05/19/21, hx of ovarian cancer and 8 gut surgeries. Hernias, foot drop on right (when fatigued), fainting spells   PAIN:  Are you having pain? Yes: NPRS scale: 2/10 - LBP Pain location: shoulders are 1/10 Pain description:stiffness, achiness     PRECAUTIONS: None   WEIGHT BEARING RESTRICTIONS: No   FALLS:  Has patient fallen in last 6 months? 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    NEXT MD VISIT:    OBJECTIVE:    DIAGNOSTIC FINDINGS:  right shoulder xray IMPRESSION: Diffuse osteopenia. Acromioclavicular and glenohumeral degenerative change. Subacromial spurring. A calcific density is noted over the proximal right humerus, this could be related to calcific tendinosis or a loose body. No acute bony abnormality identified.   Left shoulder xray IMPRESSION: Diffuse osteopenia. Acromioclavicular and glenohumeral degenerative change. Mild subacromial spurring. No acute abnormality identified.        POSTURE: Slumped forward postured, limited hip/lumbar  extension with walking. Forward head, rounded  shoulders, sacral sitting   ROM Neck motion limited in all directions but no pain with motion                        UE Measurements       Upper Extremity AROM/PROM Right 01/15/22 Left 01/15/22    A/PROM MMT A/PROM MMT  Shoulder Flexion 150/165** 4 150/165** 4  Shoulder Extension          Shoulder Abduction WFL - scaption**   WFL - Scaption**    Shoulder Adduction          Shoulder Internal Rotation Reaches to L1  SP 4  Reaches to L1 SP 4  Shoulder External Rotation Reaches to C7 SP 4+ Reaches to C7 SP 4+  Elbow Flexion          Elbow Extension          Wrist Flexion          Wrist Extension          Wrist Supination          Wrist Pronation          Wrist Ulnar Deviation          Wrist Radial Deviation          Grip Strength NA   NA                          (Blank rows = not tested)                       * pain/tightness **  difficult to perform movement Pt is right handed   Transitional Movements: STS - Heavy uses of B UE to transition and labored effort  TODAY'S TREATMENT:                                                                                                                                         DATE:  01/26/2022 Therapeutic Exercise:    Aerobic: Standing: shoulder ER green band at wall x20 5" holds B, shoulder abd horizontal green band x20, shoulder extension x20 5" holds GTB, shoulder low row x20 5" holds GTB     PATIENT EDUCATION:  Education details: on HEP, on transitioning to pool 2x/week, pilates 3x a week, on different types of things to focus on, on answering all questions ,  on plan moving forward and f/u with MD and progress anticipated  Person educated: Patient Education method: Explanation, Demonstration, and Handouts Education comprehension: verbalized understanding     HOME EXERCISE PROGRAM: [id number]  ASSESSMENT:   CLINICAL IMPRESSION: 01/26/2022 Overall patient has made some progress in PT but continues  to have the same pain complaints  in shoulders with back still being primary complaint. Explained how posture, back  and shoulders are related. Reviewed HEP and answered all questions . Provided patient with green band for HEP adherence, new printoff and  discussed aquatic benefits. Patient to discharge from PT to HEP secondary to lack of progress and continued pain, encouraged patient to follow up with MD.    OBJECTIVE IMPAIRMENTS: decreased activity tolerance, decreased mobility, difficulty walking, decreased ROM, decreased strength, impaired UE functional use, improper body mechanics, postural dysfunction, and pain.    ACTIVITY LIMITATIONS: carrying, lifting, sitting, standing, transfers, reach over head, and locomotion level   PARTICIPATION LIMITATIONS: community activity, occupation, and yard work   PERSONAL FACTORS: Age, Fitness, Past/current experiences, and 3+ comorbidities: multiple abdominal surgeries, lumbar fusion L3/4  are also affecting patient's functional outcome.    REHAB POTENTIAL: Good   CLINICAL DECISION MAKING: Evolving/moderate complexity   EVALUATION COMPLEXITY: Moderate     GOALS: Goals reviewed with patient? yes   SHORT TERM GOALS: Target date: 12/30/2021  Patient will be independent in self management strategies to improve quality of life and functional outcomes. Baseline: New Program Goal status: PROGRESSING   2.  Patient will report at least 25% improvement in overall symptoms and/or function to demonstrate improved functional mobility Baseline: 0% better Goal status: PROGRESSING   3.  Patient will reporting modifying heights of regularly used chairs at home with cushions to improve posture while sitting at home  Baseline:  Goal status: MET           LONG TERM GOALS: Target date: 01/26/2022     Patient will report at least 50% improvement in overall symptoms and/or function to demonstrate improved functional mobility Baseline: 0% better Goal status: PROGRESSING   2.  Patient will  be able to demonstrate at least 160 degrees of B shoulder ROM to improve overhead reaching Baseline:  Goal status: PARTIALLY MET   3.  Patient will be able to sit in active sitting posture for a least 2 minutes to demonstrate improved postural strength. Baseline:  Goal status: MET     PLAN:   PT FREQUENCY: 2x/week   PT DURATION: 8 weeks   PLANNED INTERVENTIONS: Therapeutic exercises, Therapeutic activity, Neuromuscular re-education, Balance training, Gait training, Patient/Family education, Self Care, Joint mobilization, Joint manipulation, DME instructions, Dry Needling, Electrical stimulation, Cryotherapy, Moist heat, Ionotophoresis '4mg'$ /ml Dexamethasone, Manual therapy, and Re-evaluation   PLAN FOR NEXT SESSION:   DC to HEP at this time  1:15 PM, 01/26/22 Jerene Pitch, DPT Physical Therapy with [facility] [place] [facility]

## 2022-01-27 ENCOUNTER — Encounter: Payer: Medicare Other | Admitting: Physical Therapy

## 2022-01-29 ENCOUNTER — Encounter: Payer: Medicare Other | Admitting: Physical Therapy

## 2022-02-03 ENCOUNTER — Encounter: Payer: Medicare Other | Admitting: Physical Therapy

## 2022-02-05 ENCOUNTER — Encounter: Payer: Medicare Other | Admitting: Physical Therapy

## 2022-02-06 ENCOUNTER — Ambulatory Visit (INDEPENDENT_AMBULATORY_CARE_PROVIDER_SITE_OTHER): Payer: Medicare Other | Admitting: *Deleted

## 2022-02-06 DIAGNOSIS — J309 Allergic rhinitis, unspecified: Secondary | ICD-10-CM

## 2022-02-18 ENCOUNTER — Ambulatory Visit (INDEPENDENT_AMBULATORY_CARE_PROVIDER_SITE_OTHER): Payer: Medicare Other

## 2022-02-18 DIAGNOSIS — J309 Allergic rhinitis, unspecified: Secondary | ICD-10-CM

## 2022-02-18 NOTE — Progress Notes (Signed)
error 

## 2022-02-25 ENCOUNTER — Encounter (HOSPITAL_BASED_OUTPATIENT_CLINIC_OR_DEPARTMENT_OTHER): Payer: Self-pay

## 2022-02-25 ENCOUNTER — Inpatient Hospital Stay (HOSPITAL_BASED_OUTPATIENT_CLINIC_OR_DEPARTMENT_OTHER)
Admission: EM | Admit: 2022-02-25 | Discharge: 2022-02-27 | DRG: 390 | Disposition: A | Discharge status: Home / Self Care | Payer: Medicare Other | Attending: Internal Medicine | Admitting: Internal Medicine

## 2022-02-25 ENCOUNTER — Other Ambulatory Visit: Payer: Self-pay

## 2022-02-25 DIAGNOSIS — I10 Essential (primary) hypertension: Secondary | ICD-10-CM | POA: Diagnosis present

## 2022-02-25 DIAGNOSIS — K56609 Unspecified intestinal obstruction, unspecified as to partial versus complete obstruction: Principal | ICD-10-CM | POA: Diagnosis present

## 2022-02-25 DIAGNOSIS — E785 Hyperlipidemia, unspecified: Secondary | ICD-10-CM | POA: Diagnosis not present

## 2022-02-25 DIAGNOSIS — Z8049 Family history of malignant neoplasm of other genital organs: Secondary | ICD-10-CM

## 2022-02-25 DIAGNOSIS — K219 Gastro-esophageal reflux disease without esophagitis: Secondary | ICD-10-CM | POA: Diagnosis present

## 2022-02-25 DIAGNOSIS — M1611 Unilateral primary osteoarthritis, right hip: Secondary | ICD-10-CM | POA: Diagnosis present

## 2022-02-25 DIAGNOSIS — Z8543 Personal history of malignant neoplasm of ovary: Secondary | ICD-10-CM

## 2022-02-25 DIAGNOSIS — R109 Unspecified abdominal pain: Secondary | ICD-10-CM | POA: Diagnosis not present

## 2022-02-25 DIAGNOSIS — Z803 Family history of malignant neoplasm of breast: Secondary | ICD-10-CM | POA: Diagnosis not present

## 2022-02-25 DIAGNOSIS — K5651 Intestinal adhesions [bands], with partial obstruction: Principal | ICD-10-CM | POA: Diagnosis present

## 2022-02-25 DIAGNOSIS — Z79899 Other long term (current) drug therapy: Secondary | ICD-10-CM

## 2022-02-25 DIAGNOSIS — M4186 Other forms of scoliosis, lumbar region: Secondary | ICD-10-CM | POA: Diagnosis not present

## 2022-02-25 DIAGNOSIS — E559 Vitamin D deficiency, unspecified: Secondary | ICD-10-CM | POA: Diagnosis present

## 2022-02-25 DIAGNOSIS — G35D Multiple sclerosis, unspecified: Secondary | ICD-10-CM | POA: Diagnosis present

## 2022-02-25 DIAGNOSIS — Z4659 Encounter for fitting and adjustment of other gastrointestinal appliance and device: Secondary | ICD-10-CM | POA: Diagnosis not present

## 2022-02-25 DIAGNOSIS — J4520 Mild intermittent asthma, uncomplicated: Secondary | ICD-10-CM | POA: Diagnosis not present

## 2022-02-25 DIAGNOSIS — K5669 Other partial intestinal obstruction: Secondary | ICD-10-CM | POA: Diagnosis not present

## 2022-02-25 DIAGNOSIS — Z90710 Acquired absence of both cervix and uterus: Secondary | ICD-10-CM | POA: Diagnosis not present

## 2022-02-25 DIAGNOSIS — G35 Multiple sclerosis: Secondary | ICD-10-CM | POA: Diagnosis present

## 2022-02-25 DIAGNOSIS — M419 Scoliosis, unspecified: Secondary | ICD-10-CM | POA: Diagnosis present

## 2022-02-25 DIAGNOSIS — Z9889 Other specified postprocedural states: Secondary | ICD-10-CM | POA: Diagnosis not present

## 2022-02-25 LAB — URINALYSIS, ROUTINE W REFLEX MICROSCOPIC
Bilirubin Urine: NEGATIVE
Glucose, UA: NEGATIVE mg/dL
Hgb urine dipstick: NEGATIVE
Ketones, ur: 15 mg/dL — AB
Leukocytes,Ua: NEGATIVE
Nitrite: NEGATIVE
Specific Gravity, Urine: 1.02 (ref 1.005–1.030)
pH: 7 (ref 5.0–8.0)

## 2022-02-25 LAB — COMPREHENSIVE METABOLIC PANEL
ALT: 12 U/L (ref 0–44)
AST: 20 U/L (ref 15–41)
Albumin: 4.8 g/dL (ref 3.5–5.0)
Alkaline Phosphatase: 99 U/L (ref 38–126)
Anion gap: 11 (ref 5–15)
BUN: 20 mg/dL (ref 8–23)
CO2: 28 mmol/L (ref 22–32)
Calcium: 10.3 mg/dL (ref 8.9–10.3)
Chloride: 101 mmol/L (ref 98–111)
Creatinine, Ser: 0.77 mg/dL (ref 0.44–1.00)
GFR, Estimated: >60 mL/min (ref >60)
Glucose, Bld: 95 mg/dL (ref 70–99)
Potassium: 3.7 mmol/L (ref 3.5–5.1)
Sodium: 140 mmol/L (ref 135–145)
Total Bilirubin: 0.6 mg/dL (ref 0.3–1.2)
Total Protein: 7.6 g/dL (ref 6.5–8.1)

## 2022-02-25 LAB — CBC
HCT: 46.5 % — ABNORMAL HIGH (ref 36.0–46.0)
Hemoglobin: 15.2 g/dL — ABNORMAL HIGH (ref 12.0–15.0)
MCH: 29.5 pg (ref 26.0–34.0)
MCHC: 32.7 g/dL (ref 30.0–36.0)
MCV: 90.1 fL (ref 80.0–100.0)
Platelets: 194 10*3/uL (ref 150–400)
RBC: 5.16 MIL/uL — ABNORMAL HIGH (ref 3.87–5.11)
RDW: 13.6 % (ref 11.5–15.5)
WBC: 7.8 10*3/uL (ref 4.0–10.5)
nRBC: 0 % (ref 0.0–0.2)

## 2022-02-25 LAB — LIPASE, BLOOD: Lipase: 11 U/L (ref 11–51)

## 2022-02-25 MED ORDER — MORPHINE SULFATE (PF) 4 MG/ML IV SOLN
4.0000 mg | Freq: Once | INTRAVENOUS | Status: AC
  Administered 2022-02-26: 4 mg via INTRAVENOUS
  Filled 2022-02-25: qty 1

## 2022-02-25 MED ORDER — SODIUM CHLORIDE 0.9 % IV BOLUS
1000.0000 mL | Freq: Once | INTRAVENOUS | Status: AC
  Administered 2022-02-26: 1000 mL via INTRAVENOUS

## 2022-02-25 MED ORDER — ONDANSETRON HCL 4 MG/2ML IJ SOLN
4.0000 mg | Freq: Once | INTRAMUSCULAR | Status: AC
  Administered 2022-02-26: 4 mg via INTRAVENOUS
  Filled 2022-02-25: qty 2

## 2022-02-25 NOTE — ED Triage Notes (Signed)
Patient here POV from Home.  Endorses ABD Cramping that began at 1300 today. Some Nausea. No Emesis. Normal BM Today. Mostly Mid ABD.   Endorses Complicated GI History with History of Obstructions, Colon Removal, Hernias.   NAD Noted during Triage. A&Ox4. GCS 15. Ambulatory.

## 2022-02-26 ENCOUNTER — Emergency Department (HOSPITAL_BASED_OUTPATIENT_CLINIC_OR_DEPARTMENT_OTHER): Payer: Medicare Other

## 2022-02-26 ENCOUNTER — Inpatient Hospital Stay (HOSPITAL_COMMUNITY): Payer: Medicare Other

## 2022-02-26 DIAGNOSIS — Z803 Family history of malignant neoplasm of breast: Secondary | ICD-10-CM | POA: Diagnosis not present

## 2022-02-26 DIAGNOSIS — G35 Multiple sclerosis: Secondary | ICD-10-CM | POA: Diagnosis not present

## 2022-02-26 DIAGNOSIS — M1611 Unilateral primary osteoarthritis, right hip: Secondary | ICD-10-CM

## 2022-02-26 DIAGNOSIS — K5651 Intestinal adhesions [bands], with partial obstruction: Secondary | ICD-10-CM | POA: Diagnosis present

## 2022-02-26 DIAGNOSIS — Z4659 Encounter for fitting and adjustment of other gastrointestinal appliance and device: Secondary | ICD-10-CM | POA: Diagnosis not present

## 2022-02-26 DIAGNOSIS — E559 Vitamin D deficiency, unspecified: Secondary | ICD-10-CM | POA: Diagnosis present

## 2022-02-26 DIAGNOSIS — Z8543 Personal history of malignant neoplasm of ovary: Secondary | ICD-10-CM | POA: Diagnosis not present

## 2022-02-26 DIAGNOSIS — J4520 Mild intermittent asthma, uncomplicated: Secondary | ICD-10-CM | POA: Diagnosis not present

## 2022-02-26 DIAGNOSIS — I10 Essential (primary) hypertension: Secondary | ICD-10-CM | POA: Diagnosis not present

## 2022-02-26 DIAGNOSIS — E785 Hyperlipidemia, unspecified: Secondary | ICD-10-CM | POA: Diagnosis present

## 2022-02-26 DIAGNOSIS — Z8049 Family history of malignant neoplasm of other genital organs: Secondary | ICD-10-CM | POA: Diagnosis not present

## 2022-02-26 DIAGNOSIS — Z79899 Other long term (current) drug therapy: Secondary | ICD-10-CM | POA: Diagnosis not present

## 2022-02-26 DIAGNOSIS — Z90710 Acquired absence of both cervix and uterus: Secondary | ICD-10-CM | POA: Diagnosis not present

## 2022-02-26 DIAGNOSIS — K56609 Unspecified intestinal obstruction, unspecified as to partial versus complete obstruction: Secondary | ICD-10-CM

## 2022-02-26 DIAGNOSIS — M4186 Other forms of scoliosis, lumbar region: Secondary | ICD-10-CM | POA: Diagnosis present

## 2022-02-26 DIAGNOSIS — R109 Unspecified abdominal pain: Secondary | ICD-10-CM | POA: Diagnosis not present

## 2022-02-26 DIAGNOSIS — K219 Gastro-esophageal reflux disease without esophagitis: Secondary | ICD-10-CM | POA: Diagnosis present

## 2022-02-26 LAB — HIV ANTIBODY (ROUTINE TESTING W REFLEX): HIV Screen 4th Generation wRfx: NONREACTIVE

## 2022-02-26 MED ORDER — IOHEXOL 300 MG/ML  SOLN
100.0000 mL | Freq: Once | INTRAMUSCULAR | Status: AC | PRN
  Administered 2022-02-26: 80 mL via INTRAVENOUS

## 2022-02-26 MED ORDER — LACTATED RINGERS IV SOLN: INTRAVENOUS | Status: DC

## 2022-02-26 MED ORDER — OXYCODONE HCL 5 MG PO TABS
5.0000 mg | ORAL_TABLET | ORAL | Status: DC | PRN
  Administered 2022-02-26 (×3): 5 mg via ORAL
  Filled 2022-02-26 (×4): qty 1

## 2022-02-26 MED ORDER — DULOXETINE HCL 60 MG PO CPEP
60.0000 mg | ORAL_CAPSULE | Freq: Every day | ORAL | Status: DC
  Administered 2022-02-26: 60 mg via ORAL
  Filled 2022-02-26: qty 1

## 2022-02-26 MED ORDER — LEFLUNOMIDE 10 MG PO TABS: 10.0000 mg | ORAL_TABLET | Freq: Every evening | ORAL | Status: DC

## 2022-02-26 MED ORDER — MIDAZOLAM HCL 2 MG/2ML IJ SOLN
1.0000 mg | Freq: Once | INTRAMUSCULAR | Status: AC
  Administered 2022-02-26: 1 mg via INTRAVENOUS
  Filled 2022-02-26: qty 2

## 2022-02-26 MED ORDER — ONDANSETRON HCL 4 MG PO TABS
4.0000 mg | ORAL_TABLET | Freq: Four times a day (QID) | ORAL | Status: DC | PRN
  Administered 2022-02-26: 4 mg via ORAL
  Filled 2022-02-26 (×2): qty 1

## 2022-02-26 MED ORDER — EPINEPHRINE 1 MG/10ML IJ SOSY: 0.3000 mg | PREFILLED_SYRINGE | Freq: Once | INTRAMUSCULAR | Status: DC | PRN

## 2022-02-26 MED ORDER — MORPHINE SULFATE (PF) 4 MG/ML IV SOLN
4.0000 mg | Freq: Once | INTRAVENOUS | Status: AC
  Administered 2022-02-26: 4 mg via INTRAVENOUS
  Filled 2022-02-26: qty 1

## 2022-02-26 MED ORDER — HYDRALAZINE HCL 20 MG/ML IJ SOLN: 10.0000 mg | Freq: Three times a day (TID) | INTRAMUSCULAR | Status: DC | PRN

## 2022-02-26 MED ORDER — DIATRIZOATE MEGLUMINE & SODIUM 66-10 % PO SOLN
90.0000 mL | Freq: Once | ORAL | Status: AC
  Administered 2022-02-26: 90 mL via NASOGASTRIC
  Filled 2022-02-26: qty 90

## 2022-02-26 MED ORDER — LEFLUNOMIDE 10 MG PO TABS
10.0000 mg | ORAL_TABLET | Freq: Every day | ORAL | Status: DC
  Administered 2022-02-27: 10 mg via ORAL
  Filled 2022-02-26 (×2): qty 1

## 2022-02-26 MED ORDER — CELECOXIB 200 MG PO CAPS
200.0000 mg | ORAL_CAPSULE | Freq: Two times a day (BID) | ORAL | Status: DC
  Administered 2022-02-27: 200 mg via ORAL
  Filled 2022-02-26 (×2): qty 1

## 2022-02-26 MED ORDER — DULOXETINE HCL 60 MG PO CPEP: 60.0000 mg | ORAL_CAPSULE | Freq: Every morning | ORAL | Status: DC

## 2022-02-26 MED ORDER — ALBUTEROL SULFATE (2.5 MG/3ML) 0.083% IN NEBU: 2.5000 mg | INHALATION_SOLUTION | RESPIRATORY_TRACT | Status: DC | PRN

## 2022-02-26 MED ORDER — ENOXAPARIN SODIUM 40 MG/0.4ML IJ SOSY
40.0000 mg | PREFILLED_SYRINGE | INTRAMUSCULAR | Status: DC
  Administered 2022-02-26 – 2022-02-27 (×2): 40 mg via SUBCUTANEOUS
  Filled 2022-02-26 (×3): qty 0.4

## 2022-02-26 MED ORDER — MORPHINE SULFATE (PF) 2 MG/ML IV SOLN
1.0000 mg | INTRAVENOUS | Status: DC | PRN
  Administered 2022-02-26 (×3): 1 mg via INTRAVENOUS
  Filled 2022-02-26 (×3): qty 1

## 2022-02-26 MED ORDER — VITAMIN D (ERGOCALCIFEROL) 1.25 MG (50000 UNIT) PO CAPS: 50000.0000 [IU] | ORAL_CAPSULE | ORAL | Status: DC

## 2022-02-26 MED ORDER — ACETAMINOPHEN 325 MG PO TABS: 650.0000 mg | ORAL_TABLET | Freq: Four times a day (QID) | ORAL | Status: DC | PRN

## 2022-02-26 MED ORDER — ONDANSETRON HCL 4 MG/2ML IJ SOLN
4.0000 mg | Freq: Four times a day (QID) | INTRAMUSCULAR | Status: DC | PRN
  Administered 2022-02-26: 4 mg via INTRAVENOUS
  Filled 2022-02-26: qty 2

## 2022-02-26 MED ORDER — AZELASTINE HCL 0.1 % NA SOLN: 1.0000 | Freq: Every day | NASAL | Status: DC | PRN

## 2022-02-26 MED ORDER — ACETAMINOPHEN 650 MG RE SUPP: 650.0000 mg | Freq: Four times a day (QID) | RECTAL | Status: DC | PRN

## 2022-02-26 NOTE — ED Provider Notes (Signed)
[facility] Provider Note   CSN: [id number] Arrival date & time: 02/25/22  1937     History  Chief Complaint  Patient presents with   Abdominal Cramping    Emily Wolfe is a 67 y.o. female.  67 yo F with a chief complaint of colicky abdominal pain.  This feels like when she had a bowel obstruction in the past.  Coming and going for about 12 hours now.  No vomiting.  Had a bowel movement just after the pain onset.  No flatus since.   Abdominal Cramping       Home Medications Prior to Admission medications   Medication Sig Start Date End Date Taking? Authorizing Provider  azelastine (ASTELIN) 0.1 % nasal spray PLACE 2 SPRAYS INTO BOTH NOSTRILS 2 TIMES DAILY. Patient taking differently: Place 1 spray into both nostrils daily as needed for allergies. 12/08/19   Dara Hoyer, FNP  celecoxib (CELEBREX) 200 MG capsule Take 1 capsule (200 mg total) by mouth 2 (two) times daily. 10/30/21   Vivi Barrack, MD  Cholecalciferol (VITAMIN D3) 1.25 MG (50000 UT) CAPS Take 1 capsule by mouth once a week. 06/26/21   [provider]  DULoxetine (CYMBALTA) 60 MG capsule Take 60 mg by mouth in the morning.    [provider]  EPINEPHrine (AUVI-Q) 0.3 mg/0.3 mL IJ SOAJ injection Inject 0.3 mg into the muscle as needed for anaphylaxis. Patient taking differently: Inject 0.3 mg into the muscle once as needed for anaphylaxis. 01/30/20   Valentina Shaggy, MD  leflunomide (ARAVA) 10 MG tablet Take 10 mg by mouth every evening.    [provider]      Allergies    Patient has no known allergies.    Review of Systems   Review of Systems  Physical Exam Updated Vital Signs BP (!) 179/79   Pulse 62   Temp 98 F (36.7 C) (Oral)   Resp 18   Ht 5' 4"$  (1.626 m)   Wt 74.4 kg   SpO2 100%   BMI 28.15 kg/m  Physical Exam Vitals and nursing note reviewed.  Constitutional:      General: She is not in acute distress.     Appearance: She is well-developed. She is not diaphoretic.  HENT:     Head: Normocephalic and atraumatic.  Eyes:     Pupils: Pupils are equal, round, and reactive to light.  Cardiovascular:     Rate and Rhythm: Normal rate and regular rhythm.     Heart sounds: No murmur heard.    No friction rub. No gallop.  Pulmonary:     Effort: Pulmonary effort is normal.     Breath sounds: No wheezing or rales.  Abdominal:     General: There is no distension.     Palpations: Abdomen is soft.     Tenderness: There is no abdominal tenderness.  Musculoskeletal:        General: No tenderness.     Cervical back: Normal range of motion and neck supple.  Skin:    General: Skin is warm and dry.  Neurological:     Mental Status: She is alert and oriented to person, place, and time.  Psychiatric:        Behavior: Behavior normal.     ED Results / Procedures / Treatments   Labs (all labs ordered are listed, but only abnormal results are displayed) Labs Reviewed  CBC - Abnormal; Notable for the following components:  Result Value   RBC 5.16 (*)    Hemoglobin 15.2 (*)    HCT 46.5 (*)    All other components within normal limits  URINALYSIS, ROUTINE W REFLEX MICROSCOPIC - Abnormal; Notable for the following components:   Ketones, ur 15 (*)    Protein, ur TRACE (*)    All other components within normal limits  LIPASE, BLOOD  COMPREHENSIVE METABOLIC PANEL    EKG None  Radiology CT ABDOMEN PELVIS W CONTRAST  Result Date: 02/26/2022 CLINICAL DATA:  Abdominal pain. EXAM: CT ABDOMEN AND PELVIS WITH CONTRAST TECHNIQUE: Multidetector CT imaging of the abdomen and pelvis was performed using the standard protocol following bolus administration of intravenous contrast. RADIATION DOSE REDUCTION: This exam was performed according to the departmental dose-optimization program which includes automated exposure control, adjustment of the mA and/or kV according to patient size and/or use of iterative  reconstruction technique. CONTRAST:  64m OMNIPAQUE IOHEXOL 300 MG/ML  SOLN COMPARISON:  CT abdomen pelvis dated 02/11/2021. FINDINGS: Lower chest: The visualized lung bases are clear. No intra-abdominal free air. Small free fluid in the lower mesentery. Hepatobiliary: The liver is unremarkable. There is mild biliary dilatation, post cholecystectomy. No retained calcified stone noted in the central CBD. Pancreas: Unremarkable. No pancreatic ductal dilatation or surrounding inflammatory changes. Spleen: Normal in size without focal abnormality. Adrenals/Urinary Tract: The adrenal glands unremarkable. There is no hydronephrosis on either side. There is symmetric enhancement and excretion of contrast by both kidneys. The visualized ureters and urinary bladder appear unremarkable. Stomach/Bowel: There is postsurgical changes of the small bowel with anastomotic suture in the lower abdomen. There is abutment of bowel loops to the anterior peritoneal wall consistent with adhesions. There is fecalization of mildly dilated bowel loops measuring up to 4 cm. Findings concerning for small bowel obstruction secondary to adhesions. Small-bowel series may provide better evaluation. There is moderate stool throughout the colon. The appendix is not visualized with certainty. No inflammatory changes identified in the right lower quadrant. Vascular/Lymphatic: Mild aortoiliac atherosclerotic disease. The IVC is unremarkable. No portal venous gas. There is no adenopathy. Reproductive: Hysterectomy.  No adnexal masses. Other: Midline vertical anterior abdominal wall incisional scar. Musculoskeletal: Degenerative changes of the spine and posterior fusion screws. No acute osseous pathology. IMPRESSION: 1. Postsurgical changes of the small bowel with findings concerning for small bowel obstruction secondary to adhesions. Small-bowel series may provide better evaluation. 2.  Aortic Atherosclerosis (ICD10-I70.0). Electronically Signed   By:  AAnner CreteM.D.   On: 02/26/2022 01:34    Procedures Procedures    Medications Ordered in ED Medications  midazolam (VERSED) injection 1 mg (has no administration in time range)  morphine (PF) 4 MG/ML injection 4 mg (4 mg Intravenous Given 02/26/22 0022)  ondansetron (ZOFRAN) injection 4 mg (4 mg Intravenous Given 02/26/22 0022)  sodium chloride 0.9 % bolus 1,000 mL (1,000 mLs Intravenous New Bag/Given 02/26/22 0023)  iohexol (OMNIPAQUE) 300 MG/ML solution 100 mL (80 mLs Intravenous Contrast Given 02/26/22 0055)    ED Course/ Medical Decision Making/ A&P                             Medical Decision Making Amount and/or Complexity of Data Reviewed Labs: ordered. Radiology: ordered.  Risk Prescription drug management.   67yo F with a chief complaints of colicky abdominal pain.  Feels like when she had a bowel obstruction in the past.  No obvious focal abdominal discomfort for me.  Will obtain CT imaging.  LFTs lipase unremarkable.  No leukocytosis.  UA negative for infection.  CT scan is concerning for a small bowel obstruction.  I discussed this with Dr. Zenia Resides, general surgery agrees with medical admission and general surgery will consult.  The patients results and plan were reviewed and discussed.   Any x-rays performed were independently reviewed by myself.   Differential diagnosis were considered with the presenting HPI.  Medications  midazolam (VERSED) injection 1 mg (has no administration in time range)  morphine (PF) 4 MG/ML injection 4 mg (4 mg Intravenous Given 02/26/22 0022)  ondansetron (ZOFRAN) injection 4 mg (4 mg Intravenous Given 02/26/22 0022)  sodium chloride 0.9 % bolus 1,000 mL (1,000 mLs Intravenous New Bag/Given 02/26/22 0023)  iohexol (OMNIPAQUE) 300 MG/ML solution 100 mL (80 mLs Intravenous Contrast Given 02/26/22 0055)    Vitals:   02/25/22 2000 02/25/22 2001 02/26/22 0010 02/26/22 0200  BP:  (!) 163/101 (!) 177/74 (!) 179/79  Pulse:  91 70 62   Resp:  16 18   Temp:  98.2 F (36.8 C) 98 F (36.7 C)   TempSrc:   Oral   SpO2:  100% 100% 100%  Weight: 74.4 kg     Height: 5' 4"$  (1.626 m)       Final diagnoses:  SBO (small bowel obstruction) (HCC)    Admission/ observation were discussed with the admitting physician, patient and/or family and they are comfortable with the plan.          Final Clinical Impression(s) / ED Diagnoses Final diagnoses:  SBO (small bowel obstruction) [facility])    Rx / DC Orders ED Discharge Orders     None         Deno Etienne, DO 02/26/22 0222

## 2022-02-26 NOTE — H&P (Signed)
History and Physical    Patient: Emily Wolfe DOB: 02-09-1955 DOA: 02/25/2022 DOS: the patient was seen and examined on 02/26/2022 PCP: Vivi Barrack, MD  Patient coming from: Home  Chief Complaint:  Chief Complaint  Patient presents with   Abdominal Cramping   HPI: Emily Wolfe is a 67 y.o. female with medical history significant of recurrent SBO, multiple abdominal surgeries (TAH/BSO for ovarian cancer, cholecystectomy, 2 hernia repairs) MS on leflunomide, chronic pain/arthritis on Cymbalta/Celebrex-who presented with upper abdominal pain since yesterday morning.  Per patient-she started to have severe upper abdominal pain around 1 PM yesterday.  Acknowledges pain to be crampy/colicky in [id number] at its severity.  This was associated with some nausea but no vomiting.  There was no particular aggravating or relieving factors.  This pain is very similar to her prior episodes of small bowel obstruction.  Her last BM was around 12-1 PM yesterday.  Has not had a BM since then.  Does not remember having flatus today or yesterday.  [facility]where a CT abdomen showed a possible small bowel obstruction.  NGT was placed but this was subsequently removed due to significant nasal discomfort (patient claims she has had nasal turbinate surgery and NG tube caused severe pain)  She was transferred to [facility] was asked to admit this patient.  No fever No headache Chest pain/shortness of breath No vomiting or diarrhea No dysuria or hematuria  Review of Systems: As mentioned in the history of present illness. All other systems reviewed and are negative. 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   Mild intermittent asthma without complication 0000000   Multiple sclerosis ([place]) 06/29/2017   Neuromuscular disorder ([place])    Osteoarthritis    Palpitations    Postmenopausal 06/29/2017   Scoliosis of lumbar spine 06/29/2017   Small bowel obstruction ([place]) 06/29/2017   Swallowing difficulty    Umbilical hernia    Urticaria    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 She has never used smokeless tobacco. She reports that she does not currently use alcohol. She reports that she does not currently use drugs.  No Known  Allergies  Family History  Problem Relation Age of Onset   Breast cancer Mother    Cancer Mother    Cervical cancer Maternal Grandmother     Prior to Admission medications   Medication Sig Start Date End Date Taking? Authorizing Provider  azelastine (ASTELIN) 0.1 % nasal spray PLACE 2 SPRAYS INTO BOTH NOSTRILS 2 TIMES DAILY. Patient taking differently: Place 1 spray into both nostrils daily as needed for allergies. 12/08/19   Dara Hoyer, FNP  celecoxib (CELEBREX) 200 MG capsule Take 1 capsule (200  mg total) by mouth 2 (two) times daily. 10/30/21   Vivi Barrack, MD  Cholecalciferol (VITAMIN D3) 1.25 MG (50000 UT) CAPS Take 1 capsule by mouth once a week. 06/26/21   [provider]  DULoxetine (CYMBALTA) 60 MG capsule Take 60 mg by mouth in the morning.    [provider]  EPINEPHrine (AUVI-Q) 0.3 mg/0.3 mL IJ SOAJ injection Inject 0.3 mg into the muscle as needed for anaphylaxis. Patient taking differently: Inject 0.3 mg into the muscle once as needed for anaphylaxis. 01/30/20   Valentina Shaggy, MD  leflunomide (ARAVA) 10 MG tablet Take 10 mg by mouth every evening.    [provider]    Physical Exam: Vitals:   02/26/22 0500 02/26/22 0600 02/26/22 0739 02/26/22 0833  BP: (!) 183/82 (!) 169/80 130/72 (!) 153/75  Pulse: 74  84 67  Resp: 18 20 18 16  $ Temp:   97.8 F (36.6 C) 98.2 F (36.8 C)  TempSrc:   Oral Oral  SpO2: 100%  99% 100%  Weight:      Height:       Gen Exam:Alert awake-not in any distress HEENT:atraumatic, normocephalic Chest: B/L clear to auscultation anteriorly CVS:S1S2 regular Abdomen: BS sluggish-tenderness in the mid abdominal area-with some guarding but no obvious peritoneal signs. Extremities:no edema Neurology: Non focal Skin: no rash  Data Reviewed:    Latest Ref Rng & Units 02/25/2022    8:02 PM 05/16/2021    2:00 PM 02/10/2021    8:43 PM  CBC  WBC 4.0 - 10.5 K/uL 7.8  7.5  7.2   Hemoglobin 12.0 - 15.0 g/dL 15.2  14.9  14.2   Hematocrit 36.0 - 46.0 % 46.5  47.3  43.8   Platelets 150 - 400 K/uL 194  205  181        Latest Ref Rng & Units 02/25/2022    8:02 PM 05/16/2021    2:00 PM 02/10/2021    8:43 PM  CMP  Glucose 70 - 99 mg/dL 95  87  116   BUN 8 - 23 mg/dL 20  22  27   $ Creatinine 0.44 - 1.00 mg/dL 0.77  0.89  0.84   Sodium 135 - 145 mmol/L 140  141  141   Potassium 3.5 - 5.1 mmol/L 3.7  3.9  3.8   Chloride 98 - 111 mmol/L 101  100  102   CO2 22 - 32 mmol/L 28  32  30   Calcium 8.9 - 10.3 mg/dL 10.3  9.7   10.1   Total Protein 6.5 - 8.1 g/dL 7.6   7.2   Total Bilirubin 0.3 - 1.2 mg/dL 0.6   0.6   Alkaline Phos 38 - 126 U/L 99   61   AST 15 - 41 U/L 20   17   ALT 0 - 44 U/L 12   12      Assessment and Plan: SBO Recurrent issue-sequelae  of prior multiple abdominal surgeries Did not tolerate NG tube due to severe nasal pain and was removed in the emergency room She does not have nausea-has not vomited-suspect we could just keep her n.p.o. and await general surgery evaluation. Continue IV fluid  Multiple sclerosis Continue leflunomide  History of chronic arthralgias/pain/OA Continue Celebrex/Cymbalta  History of vitamin D deficiency Continue vitamin D supplementation  Hypertension Reportedly not on any medications as she lost weight-managed primarily with lifestyle modifications Blood pressure has significantly elevated-suspect this is due to anxiety/pain Will place on IV hydralazine as needed-monitor BP trend, if needed-we can place her on a antihypertensive prior to discharge.  History of ovarian cancer-s/p TAH with SBO History of multiple bowel surgeries including cholecystectomy/hernia repair x 2   Advance Care Planning:   Code Status: Full Code   Consults: CCS  Family Communication: None at bedside  Severity of Illness: The appropriate patient status for this patient is INPATIENT. Inpatient status is judged to be reasonable and necessary in order to provide the required intensity of service to ensure the patient's safety. The patient's presenting symptoms, physical exam findings, and initial radiographic and laboratory data in the context of their chronic comorbidities is felt to place them at high risk for further clinical deterioration. Furthermore, it is not anticipated that the patient will be medically stable for discharge from the hospital within 2 midnights of admission.   * I certify that at the point of admission it is my clinical judgment that the patient will  require inpatient hospital care spanning beyond 2 midnights from the point of admission due to high intensity of service, high risk for further deterioration and high frequency of surveillance required.*  Author: Oren Binet, MD 02/26/2022 9:28 AM  For on call review [email or web address].

## 2022-02-26 NOTE — ED Notes (Signed)
Attempted report x1, will attempt again.

## 2022-02-26 NOTE — ED Notes (Signed)
Pt in bed, pt reports some abd pain, pt states that she doesn't need anything for pain at this time, resps even and unlabored, pt awaits transport. Marland Kitchen

## 2022-02-26 NOTE — ED Notes (Signed)
Reports increase in pain. Dr. Tyrone Nine notified and order received. See MAR.

## 2022-02-26 NOTE — ED Notes (Signed)
RN at bedside. Pt reports she had recent nose surgery in 12/2021 and thinks that may be why her nose is hurting. Dr. Tyrone Nine notified and spoke with pt. Verbal order to remove NG.

## 2022-02-26 NOTE — ED Notes (Signed)
Report called to Vicente Males, RN for [phone].

## 2022-02-26 NOTE — Progress Notes (Signed)
Plan of Care Note for accepted transfer   Patient: Emily Wolfe MRN: [id number]   [place]: 02/25/2022  Facility requesting transfer: [facility]. Requesting Provider: Dr. Tyrone Nine, [place] Reason for transfer: SBO Facility course: The patient is a 67 year old female with past medical history significant for prior SBO, prior abdominal surgery with adhesions, history of total hysterectomy due to ovarian cancer, multiple sclerosis, hypertension, hyperlipidemia, who presented to [facility] ED due to colicky abdominal pain x 12 hours, similar to prior bowel obstruction pain.  No vomiting.  No flatulence.  In the ED, CT abdomen pelvis with contrast showed postsurgical changes of the small bowel with findings concerning for small bowel obstruction secondary to adhesions.  EDP discussed the case with general surgery Dr. Zenia Resides who recommended hospital medicine admission.  General surgery will see in consultation.  The patient was admitted at [facility] telemetry surgical unit as inpatient status.  Plan of care: The patient is accepted for admission to Telemetry unit, at [facility].  Inpatient status.  Author: Kayleen Memos, DO 02/26/2022  Check [email or web address] for on-call coverage.  Nursing staff, Please call [place] number on Amion as soon as patient's arrival, so appropriate admitting provider can evaluate the pt.

## 2022-02-26 NOTE — Consult Note (Signed)
Emily Wolfe 04/12/1955  [id number].    Requesting MD: Nevada Crane, MD Chief Complaint/Reason for Consult: SBO  HPI:  Emily Wolfe is a 67 y/o F with PMH MS, chronic back pain, multiple abd surgeries, and recurrent SBO who presented to med center [facility] yesterday with a cc abdominal pain. Reports onset of pain at 1 PM yesterday, cramping and burning pain started in her upper abdomen and then spread to her lower abdomen. Reports her last episode of flatus was yesterday AM and her last BM was 1:30 PM. Denies emesis. Denies eating high fiber or new foods.   She denies fever, chills, urinary sxs, or blood in her stool.   Takes leflunomide for MS, which is an immunosuppressant.   Surgical history:  reports BSO/hysterectomy for personal history of early ovarian CA - this was complicated by infection and wound dehiscence which patient states resulted in over 7 surgeries in an 8 month period, open cholecystectomy after that, ventral hernia repair x2, and most recently in 2019 ex lap, LOA >75 min, SBR, and ventral hernia repair with biologic mesh at [facility]. 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   History of ovarian cancer 11/19/2017   History of stomach ulcers    Hyperlipidemia 06/29/2017   Hypertension 06/29/2017   Infection 10/2007   From hysterectomy sutures  rupturing   Inguinal hernia    Joint pain    Lactose intolerance    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   Mild intermittent asthma without complication 0000000   Multiple sclerosis ([place]) 06/29/2017   Neuromuscular disorder ([place])    Osteoarthritis    Palpitations    Postmenopausal 06/29/2017   Scoliosis of lumbar spine 06/29/2017   Small bowel obstruction ([place]) 06/29/2017   Swallowing difficulty    Umbilical hernia    Urticaria    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   Vitamin D deficiency 06/29/2017    Past Surgical History:  Procedure Laterality Date   ADENOIDECTOMY     CHOLECYSTECTOMY  2009   HERNIA REPAIR     HERNIA REPAIR     6/10, 9/10   HYSTERECTOMY ABDOMINAL WITH SALPINGO-OOPHORECTOMY     HYSTEROTOMY  2008   LAPAROSCOPIC LYSIS OF ADHESIONS     , With small bowel resection and ventral hernia repair with mesh 2019/[place]   MOHS SURGERY     SINOSCOPY     VARICOSE VEIN SURGERY      Social History:  reports that she has never smoked. She has never used smokeless tobacco. She reports that she does not currently use alcohol. She reports that she does not currently use drugs.  Allergies: No Known Allergies  Medications Prior to Admission  Medication Sig Dispense Refill  azelastine (ASTELIN) 0.1 % nasal spray PLACE 2 SPRAYS INTO BOTH NOSTRILS 2 TIMES DAILY. (Patient taking differently: Place 1 spray into both nostrils daily as needed for allergies.) 30 mL 0   celecoxib (CELEBREX) 200 MG capsule Take 1 capsule (200 mg total) by mouth 2 (two) times daily. 60 capsule 5   Cholecalciferol (VITAMIN D3) 1.25 MG (50000 UT) CAPS Take 1 capsule by mouth once a week.     DULoxetine (CYMBALTA) 60 MG capsule Take 60 mg by mouth in the morning.     EPINEPHrine (AUVI-Q) 0.3 mg/0.3 mL IJ SOAJ injection Inject 0.3 mg into the muscle as needed for anaphylaxis. (Patient taking differently: Inject 0.3 mg into the muscle once as needed for anaphylaxis.) 2 each 2    leflunomide (ARAVA) 10 MG tablet Take 10 mg by mouth every evening.       Physical Exam: Blood pressure (!) 153/75, pulse 67, temperature 98.2 F (36.8 C), temperature source Oral, resp. rate 16, height 5' 4"$  (1.626 m), weight 74.4 kg, SpO2 100 %. General: Pleasant white female laying on hospital bed, appears stated age, NAD. HEENT: head -normocephalic, atraumatic; Eyes: PERRLA, no conjunctival injection Neck- Trachea is midline, no thyromegaly or JVD appreciated.  CV- RRR, normal S1/S2, no M/R/G, radial and dorsalis pedis pulses 2+ BL, no lower extremity edema Pulm- breathing is non-labored. CTABL, no wheezes, rhales, rhonchi. Abd- soft, overall nontender, mild distention, multiple scars appear well-healing, no palpable hernias. GU- deferred  MSK- UE/LE symmetrical, no cyanosis, clubbing, or edema. Neuro- non-focal exam Psych- Alert and Oriented x3 with appropriate affect Skin: warm and dry, no rashes or lesions   Results for orders placed or performed during the hospital encounter of 02/25/22 (from the past 48 hour(s))  Lipase, blood     Status: None   Collection Time: 02/25/22  8:02 PM  Result Value Ref Range   Lipase 11 11 - 51 U/L    Comment: Performed at [facility], [address], [place], [place] [address]  Comprehensive metabolic panel     Status: None   Collection Time: 02/25/22  8:02 PM  Result Value Ref Range   Sodium 140 135 - 145 mmol/L   Potassium 3.7 3.5 - 5.1 mmol/L   Chloride 101 98 - 111 mmol/L   CO2 28 22 - 32 mmol/L   Glucose, Bld 95 70 - 99 mg/dL    Comment: Glucose reference range applies only to samples taken after fasting for at least 8 hours.   BUN 20 8 - 23 mg/dL   Creatinine, Ser 0.77 0.44 - 1.00 mg/dL   Calcium 10.3 8.9 - 10.3 mg/dL   Total Protein 7.6 6.5 - 8.1 g/dL   Albumin 4.8 3.5 - 5.0 g/dL   AST 20 15 - 41 U/L   ALT 12 0 - 44 U/L   Alkaline Phosphatase 99 38 - 126 U/L   Total Bilirubin 0.6 0.3 - 1.2 mg/dL   GFR,  Estimated >60 >60 mL/min    Comment: (NOTE) Calculated using the CKD-EPI Creatinine Equation (2021)    Anion gap 11 5 - 15    Comment: Performed at [facility], [address] [place], [place] [address]  CBC     Status: Abnormal   Collection Time: 02/25/22  8:02 PM  Result Value Ref Range   WBC 7.8 4.0 - 10.5 K/uL   RBC 5.16 (H) 3.87 - 5.11 MIL/uL   Hemoglobin 15.2 (H) 12.0 - 15.0 g/dL   HCT 46.5 (H)  36.0 - 46.0 %   MCV 90.1 80.0 - 100.0 fL   MCH 29.5 26.0 - 34.0 pg   MCHC 32.7 30.0 - 36.0 g/dL   RDW 13.6 11.5 - 15.5 %   Platelets 194 150 - 400 K/uL   nRBC 0.0 0.0 - 0.2 %    Comment: Performed at [facility], [address] [place], [place] [address]  Urinalysis, Routine w reflex microscopic -Urine, Clean Catch     Status: Abnormal   Collection Time: 02/25/22  8:02 PM  Result Value Ref Range   Color, Urine YELLOW YELLOW   APPearance CLEAR CLEAR   Specific Gravity, Urine 1.020 1.005 - 1.030   pH 7.0 5.0 - 8.0   Glucose, UA NEGATIVE NEGATIVE mg/dL   Hgb urine dipstick NEGATIVE NEGATIVE   Bilirubin Urine NEGATIVE NEGATIVE   Ketones, ur 15 (A) NEGATIVE mg/dL   Protein, ur TRACE (A) NEGATIVE mg/dL   Nitrite NEGATIVE NEGATIVE   Leukocytes,Ua NEGATIVE NEGATIVE    Comment: Performed at [facility], [place], [place] [address]   DG Abd Portable 1 View  Result Date: 02/26/2022 CLINICAL DATA:  NG placement. EXAM: PORTABLE ABDOMEN - 1 VIEW COMPARISON:  CT abdomen pelvis dated 02/26/2022. FINDINGS: Enteric tube with tip in the region of the gastric fundus. IMPRESSION: Enteric tube with tip in the gastric fundus. Electronically Signed   By: Anner Crete M.D.   On: 02/26/2022 03:24   CT ABDOMEN PELVIS W CONTRAST  Result Date: 02/26/2022 CLINICAL DATA:  Abdominal pain. EXAM: CT ABDOMEN AND PELVIS WITH CONTRAST TECHNIQUE: Multidetector CT imaging of the abdomen and pelvis was performed using the standard  protocol following bolus administration of intravenous contrast. RADIATION DOSE REDUCTION: This exam was performed according to the departmental dose-optimization program which includes automated exposure control, adjustment of the mA and/or kV according to patient size and/or use of iterative reconstruction technique. CONTRAST:  55m OMNIPAQUE IOHEXOL 300 MG/ML  SOLN COMPARISON:  CT abdomen pelvis dated 02/11/2021. FINDINGS: Lower chest: The visualized lung bases are clear. No intra-abdominal free air. Small free fluid in the lower mesentery. Hepatobiliary: The liver is unremarkable. There is mild biliary dilatation, post cholecystectomy. No retained calcified stone noted in the central CBD. Pancreas: Unremarkable. No pancreatic ductal dilatation or surrounding inflammatory changes. Spleen: Normal in size without focal abnormality. Adrenals/Urinary Tract: The adrenal glands unremarkable. There is no hydronephrosis on either side. There is symmetric enhancement and excretion of contrast by both kidneys. The visualized ureters and urinary bladder appear unremarkable. Stomach/Bowel: There is postsurgical changes of the small bowel with anastomotic suture in the lower abdomen. There is abutment of bowel loops to the anterior peritoneal wall consistent with adhesions. There is fecalization of mildly dilated bowel loops measuring up to 4 cm. Findings concerning for small bowel obstruction secondary to adhesions. Small-bowel series may provide better evaluation. There is moderate stool throughout the colon. The appendix is not visualized with certainty. No inflammatory changes identified in the right lower quadrant. Vascular/Lymphatic: Mild aortoiliac atherosclerotic disease. The IVC is unremarkable. No portal venous gas. There is no adenopathy. Reproductive: Hysterectomy.  No adnexal masses. Other: Midline vertical anterior abdominal wall incisional scar. Musculoskeletal: Degenerative changes of the spine and posterior  fusion screws. No acute osseous pathology. IMPRESSION: 1. Postsurgical changes of the small bowel with findings concerning for small bowel obstruction secondary to adhesions. Small-bowel series may provide better evaluation. 2.  Aortic Atherosclerosis (ICD10-I70.0). Electronically Signed   By: ALaren EvertsD.  On: 02/26/2022 01:34      Assessment/Plan SBO likely due to adhesions from multiple abdominal surgeries  - suspect this is an acute exacerbation of a chronic pSBO - pulled her NG tube out due to a lot of pain in her nose after nasal turbinate operation in December. Stomach is decompressed on CT imaging and patient without significant distention. Leave NG tube out and consider replacement for worsening obstructive sxs. - PO SBO protocol - no emergent role for surgery currently. Surgery would be extremely difficult given her complex history as listed above  FEN - NPO, IVF VTE - SCD's, ok for lovenox ID - none indicated  Admit - TRH service    I reviewed nursing notes, ED provider notes, hospitalist notes, last 24 h vitals and pain scores, last 48 h intake and output, last 24 h labs and trends, and last 24 h imaging results.  [facility] 02/26/2022, 9:12 AM Please see Amion for pager number during day hours 7:00am-4:30pm or 7:00am -11:30am on weekends

## 2022-02-26 NOTE — ED Notes (Signed)
Attempted report x2, will attempt again.

## 2022-02-27 LAB — BASIC METABOLIC PANEL
Anion gap: 8 (ref 5–15)
BUN: 19 mg/dL (ref 8–23)
CO2: 24 mmol/L (ref 22–32)
Calcium: 8.9 mg/dL (ref 8.9–10.3)
Chloride: 106 mmol/L (ref 98–111)
Creatinine, Ser: 0.81 mg/dL (ref 0.44–1.00)
GFR, Estimated: >60 mL/min (ref >60)
Glucose, Bld: 100 mg/dL — ABNORMAL HIGH (ref 70–99)
Potassium: 3.5 mmol/L (ref 3.5–5.1)
Sodium: 138 mmol/L (ref 135–145)

## 2022-02-27 LAB — CBC
HCT: 42.1 % (ref 36.0–46.0)
Hemoglobin: 14.2 g/dL (ref 12.0–15.0)
MCH: 30.6 pg (ref 26.0–34.0)
MCHC: 33.7 g/dL (ref 30.0–36.0)
MCV: 90.7 fL (ref 80.0–100.0)
Platelets: 172 10*3/uL (ref 150–400)
RBC: 4.64 MIL/uL (ref 3.87–5.11)
RDW: 13.6 % (ref 11.5–15.5)
WBC: 7.3 10*3/uL (ref 4.0–10.5)
nRBC: 0 % (ref 0.0–0.2)

## 2022-02-27 NOTE — Progress Notes (Signed)
Mobility Specialist Progress Note    02/27/22 1016  Mobility  Activity Ambulated independently in hallway  Level of Assistance Standby assist, set-up cues, supervision of patient - no hands on  Assistive Device None  Distance Ambulated (ft) 500 ft  Activity Response Tolerated well  $Mobility charge 1 Mobility   Pt received in bed and agreeable. No complaints on walk. Returned to bed with call bell in reach.   Hildred Alamin Mobility Specialist  Please Psychologist, sport and exercise or Rehab Office at [phone]

## 2022-02-27 NOTE — Discharge Summary (Signed)
PATIENT DETAILS Name: Emily Wolfe Age: 67 y.o. Sex: female Date of Birth: 30-Jun-1955 MRN: [id number]. Admitting Physician: Kayleen Memos, DO [id number], Algis Greenhouse, MD  Admit Date: 02/25/2022 Discharge date: 02/27/2022  Recommendations for Outpatient Follow-up:  Follow up with PCP in 1-2 weeks Please obtain CMP/CBC in one week  Admitted From:  Home  Disposition: Home   Discharge Condition: good  CODE STATUS:   Code Status: Full Code   Diet recommendation:  Diet Order             DIET SOFT Room service appropriate? Yes; Fluid consistency: Thin  Diet effective now           Diet - low sodium heart healthy                    Brief Summary: 67 year old female with history of recurrent SBO following multiple abdominal surgeries (TAH/BSO for ovarian cancer, cholecystectomy, 2 hernia repairs) MS on leflunomide, chronic pain/arthritis-scented with abdominal pain/nausea-found to have SBO.  Brief Hospital Course: SBO Recurrent issue-sequelae of prior multiple abdominal surgeries Did not tolerate NG tube due to severe nasal pain and was removed in the emergency room Was admitted to [facility] service and managed conservatively-she had a BM earlier this morning, and diet is being advanced, if she tolerates advancement in diet today-she will be discharged home.  Her abdominal pain has completely resolved. Patient is very similar with PSBO as she has had numerous episodes over the past 10-15 years-and already is familiar with dietary restrictions/fiber etc.   Multiple sclerosis Continue leflunomide   History of chronic arthralgias/pain/OA Continue Celebrex/Cymbalta   History of vitamin D deficiency Continue vitamin D supplementation   Hypertension Reportedly not on any medications as she lost weight-managed primarily with lifestyle modifications BP was elevated yesterday but stable today-continue outpatient monitoring by PCP.  History of ovarian cancer-s/p TAH with  SBO History of multiple bowel surgeries including cholecystectomy/hernia repair x 2  Discharge Diagnoses:  Principal Problem:   SBO (small bowel obstruction) (HCC) Active Problems:   Mild intermittent asthma without complication   Essential hypertension   MS (multiple sclerosis) (HCC)   Scoliosis of lumbar spine   Primary osteoarthritis of right hip   Discharge Instructions:  Activity:  As tolerated    Discharge Instructions     Call MD for:  persistant nausea and vomiting   Complete by: As directed    Call MD for:  severe uncontrolled pain   Complete by: As directed    Diet - low sodium heart healthy   Complete by: As directed    Discharge instructions   Complete by: As directed    Follow with Primary MD  Vivi Barrack, MD in 1-2 weeks  Please get a complete blood count and chemistry panel checked by your Primary MD at your next visit, and again as instructed by your Primary MD.  Get Medicines reviewed and adjusted: Please take all your medications with you for your next visit with your Primary MD  Laboratory/radiological data: Please request your Primary MD to go over all hospital tests and procedure/radiological results at the follow up, please ask your Primary MD to get all Hospital records sent to his/her office.  In some cases, they will be blood work, cultures and biopsy results pending at the time of your discharge. Please request that your primary care M.D. follows up on these results.  Also Note the following: If you experience worsening of your admission symptoms, develop shortness of  breath, life threatening emergency, suicidal or homicidal thoughts you must seek medical attention immediately by calling 911 or calling your MD immediately  if symptoms less severe.  You must read complete instructions/literature along with all the possible adverse reactions/side effects for all the Medicines you take and that have been prescribed to you. Take any new Medicines  after you have completely understood and accpet all the possible adverse reactions/side effects.   Do not drive when taking Pain medications or sleeping medications (Benzodaizepines)  Do not take more than prescribed Pain, Sleep and Anxiety Medications. It is not advisable to combine anxiety,sleep and pain medications without talking with your primary care practitioner  Special Instructions: If you have smoked or chewed Tobacco  in the last 2 yrs please stop smoking, stop any regular Alcohol  and or any Recreational drug use.  Wear Seat belts while driving.  Please note: You were cared for by a hospitalist during your hospital stay. Once you are discharged, your primary care physician will handle any further medical issues. Please note that NO REFILLS for any discharge medications will be authorized once you are discharged, as it is imperative that you return to your primary care physician (or establish a relationship with a primary care physician if you do not have one) for your post hospital discharge needs so that they can reassess your need for medications and monitor your lab values.   Increase activity slowly   Complete by: As directed       Allergies as of 02/27/2022   No Known Allergies      Medication List     TAKE these medications    azelastine 0.1 % nasal spray Commonly known as: ASTELIN PLACE 2 SPRAYS INTO BOTH NOSTRILS 2 TIMES DAILY. What changed: See the new instructions.   celecoxib 200 MG capsule Commonly known as: CeleBREX Take 1 capsule (200 mg total) by mouth 2 (two) times daily.   DULoxetine 60 MG capsule Commonly known as: CYMBALTA Take 60 mg by mouth in the morning.   EPINEPHrine 0.3 mg/0.3 mL Soaj injection Commonly known as: Auvi-Q Inject 0.3 mg into the muscle as needed for anaphylaxis. What changed: when to take this   leflunomide 10 MG tablet Commonly known as: ARAVA Take 10 mg by mouth every evening.   Vitamin D3 1.25 MG (50000 UT)  capsule Generic drug: Cholecalciferol Take 1 capsule by mouth once a week.        Follow-up Information     Vivi Barrack, MD Follow up.   Specialty: Family Medicine Contact information: [address] [place] [address] [phone]                No Known Allergies   Other Procedures/Studies: DG Abd Portable 1V-Small Bowel Obstruction Protocol-initial, 8 hr delay  Result Date: 02/26/2022 CLINICAL DATA:  8 hour small-bowel follow-up film EXAM: PORTABLE ABDOMEN - 1 VIEW COMPARISON:  Film from earlier in the same day. FINDINGS: Gastric catheter appears to have been surgically removed. Previously administered contrast lies within the stomach although also scattered throughout the small bowel with mild contrast within the colon. These changes are consistent with a partial small bowel obstruction. Persistent small bowel dilatation remains. Postsurgical changes in the lumbar spine are noted. IMPRESSION: Passage of contrast material through the small bowel and into the proximal colon consistent with a partial small bowel obstruction. Persistent small bowel dilatation is seen. Electronically Signed   By: Inez Catalina M.D.   On: 02/26/2022 19:34  DG Abd Portable 1 View  Result Date: 02/26/2022 CLINICAL DATA:  NG placement. EXAM: PORTABLE ABDOMEN - 1 VIEW COMPARISON:  CT abdomen pelvis dated 02/26/2022. FINDINGS: Enteric tube with tip in the region of the gastric fundus. IMPRESSION: Enteric tube with tip in the gastric fundus. Electronically Signed   By: Anner Crete M.D.   On: 02/26/2022 03:24   CT ABDOMEN PELVIS W CONTRAST  Result Date: 02/26/2022 CLINICAL DATA:  Abdominal pain. EXAM: CT ABDOMEN AND PELVIS WITH CONTRAST TECHNIQUE: Multidetector CT imaging of the abdomen and pelvis was performed using the standard protocol following bolus administration of intravenous contrast. RADIATION DOSE REDUCTION: This exam was performed according to the departmental dose-optimization  program which includes automated exposure control, adjustment of the mA and/or kV according to patient size and/or use of iterative reconstruction technique. CONTRAST:  69m OMNIPAQUE IOHEXOL 300 MG/ML  SOLN COMPARISON:  CT abdomen pelvis dated 02/11/2021. FINDINGS: Lower chest: The visualized lung bases are clear. No intra-abdominal free air. Small free fluid in the lower mesentery. Hepatobiliary: The liver is unremarkable. There is mild biliary dilatation, post cholecystectomy. No retained calcified stone noted in the central CBD. Pancreas: Unremarkable. No pancreatic ductal dilatation or surrounding inflammatory changes. Spleen: Normal in size without focal abnormality. Adrenals/Urinary Tract: The adrenal glands unremarkable. There is no hydronephrosis on either side. There is symmetric enhancement and excretion of contrast by both kidneys. The visualized ureters and urinary bladder appear unremarkable. Stomach/Bowel: There is postsurgical changes of the small bowel with anastomotic suture in the lower abdomen. There is abutment of bowel loops to the anterior peritoneal wall consistent with adhesions. There is fecalization of mildly dilated bowel loops measuring up to 4 cm. Findings concerning for small bowel obstruction secondary to adhesions. Small-bowel series may provide better evaluation. There is moderate stool throughout the colon. The appendix is not visualized with certainty. No inflammatory changes identified in the right lower quadrant. Vascular/Lymphatic: Mild aortoiliac atherosclerotic disease. The IVC is unremarkable. No portal venous gas. There is no adenopathy. Reproductive: Hysterectomy.  No adnexal masses. Other: Midline vertical anterior abdominal wall incisional scar. Musculoskeletal: Degenerative changes of the spine and posterior fusion screws. No acute osseous pathology. IMPRESSION: 1. Postsurgical changes of the small bowel with findings concerning for small bowel obstruction secondary to  adhesions. Small-bowel series may provide better evaluation. 2.  Aortic Atherosclerosis (ICD10-I70.0). Electronically Signed   By: AAnner CreteM.D.   On: 02/26/2022 01:34     TODAY-DAY OF DISCHARGE:  Subjective:   CManfred Shirtstoday has no headache,no chest abdominal pain,no new weakness tingling or numbness, feels much better wants to go home today.   Objective:   Blood pressure 124/67, pulse 74, temperature 98.5 F (36.9 C), temperature source Oral, resp. rate 18, height '5\' 4"'$  (1.626 m), weight 74.4 kg, SpO2 97 %. No intake or output data in the 24 hours ending 02/27/22 0927 Filed Weights   02/25/22 2000  Weight: 74.4 kg    Exam: Awake Alert, Oriented *3, No new F.N deficits, Normal affect [place].AT,PERRAL Supple Neck,No JVD, No cervical lymphadenopathy appriciated.  Symmetrical Chest wall movement, Good air movement bilaterally, CTAB RRR,No Gallops,Rubs or new Murmurs, No Parasternal Heave +ve B.Sounds, Abd Soft, Non tender, No organomegaly appriciated, No rebound -guarding or rigidity. No Cyanosis, Clubbing or edema, No new Rash or bruise   PERTINENT RADIOLOGIC STUDIES: DG Abd Portable 1V-Small Bowel Obstruction Protocol-initial, 8 hr delay  Result Date: 02/26/2022 CLINICAL DATA:  8 hour small-bowel follow-up film EXAM: PORTABLE ABDOMEN - 1  VIEW COMPARISON:  Film from earlier in the same day. FINDINGS: Gastric catheter appears to have been surgically removed. Previously administered contrast lies within the stomach although also scattered throughout the small bowel with mild contrast within the colon. These changes are consistent with a partial small bowel obstruction. Persistent small bowel dilatation remains. Postsurgical changes in the lumbar spine are noted. IMPRESSION: Passage of contrast material through the small bowel and into the proximal colon consistent with a partial small bowel obstruction. Persistent small bowel dilatation is seen. Electronically Signed   By:  Inez Catalina M.D.   On: 02/26/2022 19:34   DG Abd Portable 1 View  Result Date: 02/26/2022 CLINICAL DATA:  NG placement. EXAM: PORTABLE ABDOMEN - 1 VIEW COMPARISON:  CT abdomen pelvis dated 02/26/2022. FINDINGS: Enteric tube with tip in the region of the gastric fundus. IMPRESSION: Enteric tube with tip in the gastric fundus. Electronically Signed   By: Anner Crete M.D.   On: 02/26/2022 03:24   CT ABDOMEN PELVIS W CONTRAST  Result Date: 02/26/2022 CLINICAL DATA:  Abdominal pain. EXAM: CT ABDOMEN AND PELVIS WITH CONTRAST TECHNIQUE: Multidetector CT imaging of the abdomen and pelvis was performed using the standard protocol following bolus administration of intravenous contrast. RADIATION DOSE REDUCTION: This exam was performed according to the departmental dose-optimization program which includes automated exposure control, adjustment of the mA and/or kV according to patient size and/or use of iterative reconstruction technique. CONTRAST:  50m OMNIPAQUE IOHEXOL 300 MG/ML  SOLN COMPARISON:  CT abdomen pelvis dated 02/11/2021. FINDINGS: Lower chest: The visualized lung bases are clear. No intra-abdominal free air. Small free fluid in the lower mesentery. Hepatobiliary: The liver is unremarkable. There is mild biliary dilatation, post cholecystectomy. No retained calcified stone noted in the central CBD. Pancreas: Unremarkable. No pancreatic ductal dilatation or surrounding inflammatory changes. Spleen: Normal in size without focal abnormality. Adrenals/Urinary Tract: The adrenal glands unremarkable. There is no hydronephrosis on either side. There is symmetric enhancement and excretion of contrast by both kidneys. The visualized ureters and urinary bladder appear unremarkable. Stomach/Bowel: There is postsurgical changes of the small bowel with anastomotic suture in the lower abdomen. There is abutment of bowel loops to the anterior peritoneal wall consistent with adhesions. There is fecalization of  mildly dilated bowel loops measuring up to 4 cm. Findings concerning for small bowel obstruction secondary to adhesions. Small-bowel series may provide better evaluation. There is moderate stool throughout the colon. The appendix is not visualized with certainty. No inflammatory changes identified in the right lower quadrant. Vascular/Lymphatic: Mild aortoiliac atherosclerotic disease. The IVC is unremarkable. No portal venous gas. There is no adenopathy. Reproductive: Hysterectomy.  No adnexal masses. Other: Midline vertical anterior abdominal wall incisional scar. Musculoskeletal: Degenerative changes of the spine and posterior fusion screws. No acute osseous pathology. IMPRESSION: 1. Postsurgical changes of the small bowel with findings concerning for small bowel obstruction secondary to adhesions. Small-bowel series may provide better evaluation. 2.  Aortic Atherosclerosis (ICD10-I70.0). Electronically Signed   By: AAnner CreteM.D.   On: 02/26/2022 01:34     PERTINENT LAB RESULTS: CBC: Recent Labs    02/25/22 2002 02/27/22 0432  WBC 7.8 7.3  HGB 15.2* 14.2  HCT 46.5* 42.1  PLT 194 172   CMET CMP     Component Value Date/Time   NA 138 02/27/2022 0432   NA 144 07/01/2020 0900   K 3.5 02/27/2022 0432   CL 106 02/27/2022 0432   CO2 24 02/27/2022 0432   GLUCOSE 100 (  H) 02/27/2022 0432   BUN 19 02/27/2022 0432   BUN 20 07/01/2020 0900   CREATININE 0.81 02/27/2022 0432   CALCIUM 8.9 02/27/2022 0432   PROT 7.6 02/25/2022 2002   PROT 6.4 07/01/2020 0900   ALBUMIN 4.8 02/25/2022 2002   ALBUMIN 4.6 07/01/2020 0900   AST 20 02/25/2022 2002   ALT 12 02/25/2022 2002   ALKPHOS 99 02/25/2022 2002   BILITOT 0.6 02/25/2022 2002   BILITOT 0.5 07/01/2020 0900   GFRNONAA >60 02/27/2022 0432   GFRAA 83 01/11/2020 1608    GFR Estimated Creatinine Clearance: 67.5 mL/min (by C-G formula based on SCr of 0.81 mg/dL). Recent Labs    02/25/22 2002  LIPASE 11   No results for input(s):  "CKTOTAL", "CKMB", "CKMBINDEX", "TROPONINI" in the last 72 hours. Invalid input(s): "POCBNP" No results for input(s): "DDIMER" in the last 72 hours. No results for input(s): "HGBA1C" in the last 72 hours. No results for input(s): "CHOL", "HDL", "LDLCALC", "TRIG", "CHOLHDL", "LDLDIRECT" in the last 72 hours. No results for input(s): "TSH", "T4TOTAL", "T3FREE", "THYROIDAB" in the last 72 hours.  Invalid input(s): "FREET3" No results for input(s): "VITAMINB12", "FOLATE", "FERRITIN", "TIBC", "IRON", "RETICCTPCT" in the last 72 hours. Coags: No results for input(s): "INR" in the last 72 hours.  Invalid input(s): "PT" Microbiology: No results found for this or any previous visit (from the past 240 hour(s)).  FURTHER DISCHARGE INSTRUCTIONS:  Get Medicines reviewed and adjusted: Please take all your medications with you for your next visit with your Primary MD  Laboratory/radiological data: Please request your Primary MD to go over all hospital tests and procedure/radiological results at the follow up, please ask your Primary MD to get all Hospital records sent to his/her office.  In some cases, they will be blood work, cultures and biopsy results pending at the time of your discharge. Please request that your primary care M.D. goes through all the records of your hospital data and follows up on these results.  Also Note the following: If you experience worsening of your admission symptoms, develop shortness of breath, life threatening emergency, suicidal or homicidal thoughts you must seek medical attention immediately by calling 911 or calling your MD immediately  if symptoms less severe.  You must read complete instructions/literature along with all the possible adverse reactions/side effects for all the Medicines you take and that have been prescribed to you. Take any new Medicines after you have completely understood and accpet all the possible adverse reactions/side effects.   Do not  drive when taking Pain medications or sleeping medications (Benzodaizepines)  Do not take more than prescribed Pain, Sleep and Anxiety Medications. It is not advisable to combine anxiety,sleep and pain medications without talking with your primary care practitioner  Special Instructions: If you have smoked or chewed Tobacco  in the last 2 yrs please stop smoking, stop any regular Alcohol  and or any Recreational drug use.  Wear Seat belts while driving.  Please note: You were cared for by a hospitalist during your hospital stay. Once you are discharged, your primary care physician will handle any further medical issues. Please note that NO REFILLS for any discharge medications will be authorized once you are discharged, as it is imperative that you return to your primary care physician (or establish a relationship with a primary care physician if you do not have one) for your post hospital discharge needs so that they can reassess your need for medications and monitor your lab values.  Total Time spent  coordinating discharge including counseling, education and face to face time equals greater than 30 minutes.  SignedOren Binet 02/27/2022 9:27 AM

## 2022-02-27 NOTE — Progress Notes (Signed)
Progress Note     Subjective: Patient reports small hard stool around 3AM and feels like things have resolved since then. Denies nausea, pain or distention. Hoping to be able to go home today and follow soft diet at home.   Objective: Vital signs in last 24 hours: Temp:  [98 F (36.7 C)-98.5 F (36.9 C)] 98.5 F (36.9 C) (02/23 0736) Pulse Rate:  [68-87] 74 (02/23 0736) Resp:  [14-18] 18 (02/23 0736) BP: (124-151)/(67-86) 124/67 (02/23 0736) SpO2:  [95 %-98 %] 97 % (02/23 0736) Last BM Date : 02/27/22  Intake/Output from previous day: No intake/output data recorded. Intake/Output this shift: No intake/output data recorded.  PE: General: pleasant, WD, WN female who is laying in bed in NAD Heart: regular, rate, and rhythm.   Lungs: Respiratory effort nonlabored Abd: soft, NT, ND, +BS Psych: A&Ox3 with an appropriate affect.    Lab Results:  Recent Labs    02/25/22 2002 02/27/22 0432  WBC 7.8 7.3  HGB 15.2* 14.2  HCT 46.5* 42.1  PLT 194 172   BMET Recent Labs    02/25/22 2002 02/27/22 0432  NA 140 138  K 3.7 3.5  CL 101 106  CO2 28 24  GLUCOSE 95 100*  BUN 20 19  CREATININE 0.77 0.81  CALCIUM 10.3 8.9   PT/INR No results for input(s): "LABPROT", "INR" in the last 72 hours. CMP     Component Value Date/Time   NA 138 02/27/2022 0432   NA 144 07/01/2020 0900   K 3.5 02/27/2022 0432   CL 106 02/27/2022 0432   CO2 24 02/27/2022 0432   GLUCOSE 100 (H) 02/27/2022 0432   BUN 19 02/27/2022 0432   BUN 20 07/01/2020 0900   CREATININE 0.81 02/27/2022 0432   CALCIUM 8.9 02/27/2022 0432   PROT 7.6 02/25/2022 2002   PROT 6.4 07/01/2020 0900   ALBUMIN 4.8 02/25/2022 2002   ALBUMIN 4.6 07/01/2020 0900   AST 20 02/25/2022 2002   ALT 12 02/25/2022 2002   ALKPHOS 99 02/25/2022 2002   BILITOT 0.6 02/25/2022 2002   BILITOT 0.5 07/01/2020 0900   GFRNONAA >60 02/27/2022 0432   GFRAA 83 01/11/2020 1608   Lipase     Component Value Date/Time   LIPASE 11  02/25/2022 2002       Studies/Results: DG Abd Portable 1V-Small Bowel Obstruction Protocol-initial, 8 hr delay  Result Date: 02/26/2022 CLINICAL DATA:  8 hour small-bowel follow-up film EXAM: PORTABLE ABDOMEN - 1 VIEW COMPARISON:  Film from earlier in the same day. FINDINGS: Gastric catheter appears to have been surgically removed. Previously administered contrast lies within the stomach although also scattered throughout the small bowel with mild contrast within the colon. These changes are consistent with a partial small bowel obstruction. Persistent small bowel dilatation remains. Postsurgical changes in the lumbar spine are noted. IMPRESSION: Passage of contrast material through the small bowel and into the proximal colon consistent with a partial small bowel obstruction. Persistent small bowel dilatation is seen. Electronically Signed   By: Inez Catalina M.D.   On: 02/26/2022 19:34   DG Abd Portable 1 View  Result Date: 02/26/2022 CLINICAL DATA:  NG placement. EXAM: PORTABLE ABDOMEN - 1 VIEW COMPARISON:  CT abdomen pelvis dated 02/26/2022. FINDINGS: Enteric tube with tip in the region of the gastric fundus. IMPRESSION: Enteric tube with tip in the gastric fundus. Electronically Signed   By: Anner Crete M.D.   On: 02/26/2022 03:24   CT ABDOMEN PELVIS W CONTRAST  Result Date: 02/26/2022 CLINICAL DATA:  Abdominal pain. EXAM: CT ABDOMEN AND PELVIS WITH CONTRAST TECHNIQUE: Multidetector CT imaging of the abdomen and pelvis was performed using the standard protocol following bolus administration of intravenous contrast. RADIATION DOSE REDUCTION: This exam was performed according to the departmental dose-optimization program which includes automated exposure control, adjustment of the mA and/or kV according to patient size and/or use of iterative reconstruction technique. CONTRAST:  66m OMNIPAQUE IOHEXOL 300 MG/ML  SOLN COMPARISON:  CT abdomen pelvis dated 02/11/2021. FINDINGS: Lower chest: The  visualized lung bases are clear. No intra-abdominal free air. Small free fluid in the lower mesentery. Hepatobiliary: The liver is unremarkable. There is mild biliary dilatation, post cholecystectomy. No retained calcified stone noted in the central CBD. Pancreas: Unremarkable. No pancreatic ductal dilatation or surrounding inflammatory changes. Spleen: Normal in size without focal abnormality. Adrenals/Urinary Tract: The adrenal glands unremarkable. There is no hydronephrosis on either side. There is symmetric enhancement and excretion of contrast by both kidneys. The visualized ureters and urinary bladder appear unremarkable. Stomach/Bowel: There is postsurgical changes of the small bowel with anastomotic suture in the lower abdomen. There is abutment of bowel loops to the anterior peritoneal wall consistent with adhesions. There is fecalization of mildly dilated bowel loops measuring up to 4 cm. Findings concerning for small bowel obstruction secondary to adhesions. Small-bowel series may provide better evaluation. There is moderate stool throughout the colon. The appendix is not visualized with certainty. No inflammatory changes identified in the right lower quadrant. Vascular/Lymphatic: Mild aortoiliac atherosclerotic disease. The IVC is unremarkable. No portal venous gas. There is no adenopathy. Reproductive: Hysterectomy.  No adnexal masses. Other: Midline vertical anterior abdominal wall incisional scar. Musculoskeletal: Degenerative changes of the spine and posterior fusion screws. No acute osseous pathology. IMPRESSION: 1. Postsurgical changes of the small bowel with findings concerning for small bowel obstruction secondary to adhesions. Small-bowel series may provide better evaluation. 2.  Aortic Atherosclerosis (ICD10-I70.0). Electronically Signed   By: AAnner CreteM.D.   On: 02/26/2022 01:34    Anti-infectives: Anti-infectives (From admission, onward)    None        Assessment/Plan  SBO  likely due to adhesions from multiple abdominal surgeries  - suspect this is an acute exacerbation of a chronic pSBO - PO SBO protocol initiated yesterday - 8h delay with contrast in proximal colon, had a BM this AM and feeling significantly better  - ok to advance to soft diet and discharge home later today if tolerating  - no emergent role for surgery currently.    FEN - NPO, IVF VTE - SCD's, ok for lovenox ID - none indicated  Admit - TRH service    LOS: 1 day   I reviewed hospitalist notes, last 24 h vitals and pain scores, last 48 h intake and output, last 24 h labs and trends, and last 24 h imaging results.   KNorm Parcel [facility] 02/27/2022, 9:09 AM Please see Amion for pager number during day hours 7:00am-4:30pm

## 2022-02-27 NOTE — Progress Notes (Signed)
Pt stated this morning that she had a small hard bowel movement she described "size of a mandarin" causing her to feel much better, no pain, no nausea.  Patient looks very happy, she's more talkative in a please mood.

## 2022-03-02 ENCOUNTER — Telehealth: Payer: Self-pay

## 2022-03-02 NOTE — Transitions of Care (Post Inpatient/ED Visit) (Signed)
   03/02/2022  Name: Emily Wolfe MRN: [id number] DOB: 01/05/1956  Today's TOC FU Call Status: Today's TOC FU Call Status:: Successful TOC FU Call Competed TOC FU Call Complete Date: 03/02/22 (Incoming call from patient returning RN CM call)  Transition Care Management Follow-up Telephone Call Date of Discharge: 02/27/22 Discharge Facility: [facility]) Type of Discharge: Inpatient Admission Primary Inpatient Discharge Diagnosis:: "SBO" How have you been since you were released from the hospital?: Better (Patient states as she soon as she was "able to pass stool she felt immediate releif and been back to nromal. LBM was yesterday. She is headed to Pilates class that she attends 2x/wk.) Any questions or concerns?: No  Items Reviewed: Did you receive and understand the discharge instructions provided?: Yes Medications obtained and verified?: Yes (Medications Reviewed) Any new allergies since your discharge?: No Dietary orders reviewed?: Yes Type of Diet Ordered:: patient states she is eating soft foods for now to give her stomach a rest Do you have support at home?: No  Home Care and Equipment/Supplies: [place] Ordered?: No Any new equipment or medical supplies ordered?: No  Functional Questionnaire: Do you need assistance with bathing/showering or dressing?: No Do you need assistance with meal preparation?: No Do you need assistance with eating?: No Do you have difficulty maintaining continence: No Do you need assistance with getting out of bed/getting out of a chair/moving?: No Do you have difficulty managing or taking your medications?: No  Folllow up appointments reviewed: PCP Follow-up appointment confirmed?: No (patient will call later to make an appt) [facility] Follow-up appointment confirmed?: NA Do you need transportation to your follow-up appointment?: No Do you understand care options if your condition(s) worsen?: Yes-patient verbalized  understanding  SDOH Interventions Today    Flowsheet Row Most Recent Value  SDOH Interventions   Transportation Interventions Intervention Not Indicated      TOC Interventions Today    Flowsheet Row Most Recent Value  TOC Interventions   TOC Interventions Discussed/Reviewed TOC Interventions Discussed  [pt aware to call PCP to make appt in 1-2wks-declined making appt while on this call-will call office later]       Interventions Today    Flowsheet Row Most Recent Value  Nutrition Interventions   Nutrition Discussed/Reviewed Nutrition Discussed  Pharmacy Interventions   Pharmacy Dicussed/Reviewed Pharmacy Topics Discussed, Medications and their functions  Safety Interventions   Safety Discussed/Reviewed Safety Discussed       Hetty Blend [facility] Care Management Care Management Community Coordinator Direct Phone: [phone] Toll Free: [phone] Fax: [phone]

## 2022-03-02 NOTE — Transitions of Care (Post Inpatient/ED Visit) (Signed)
   03/02/2022  Name: Alyona Kanu MRN: [id number] DOB: 1955-02-08  Today's TOC FU Call Status: Today's TOC FU Call Status:: Unsuccessul Call (1st Attempt) Unsuccessful Call (1st Attempt) Date: 03/02/22  Attempted to reach the patient regarding the most recent Inpatient/ED visit.  Follow Up Plan: Additional outreach attempts will be made to reach the patient to complete the Transitions of Care (Post Inpatient/ED visit) call.     Enzo Montgomery, RN,BSN,CCM [facility] Care Management Care Management Community Coordinator Direct Phone: [phone] Toll Free: [phone] Fax: [phone]

## 2022-03-10 ENCOUNTER — Encounter: Payer: Self-pay | Admitting: Family Medicine

## 2022-03-10 ENCOUNTER — Ambulatory Visit (INDEPENDENT_AMBULATORY_CARE_PROVIDER_SITE_OTHER): Payer: Medicare Other | Admitting: Family Medicine

## 2022-03-10 VITALS — BP 139/86 | HR 64 | Temp 97.5°F | Ht 64.0 in | Wt 168.0 lb

## 2022-03-10 DIAGNOSIS — K56609 Unspecified intestinal obstruction, unspecified as to partial versus complete obstruction: Secondary | ICD-10-CM | POA: Diagnosis not present

## 2022-03-10 DIAGNOSIS — I10 Essential (primary) hypertension: Secondary | ICD-10-CM | POA: Diagnosis not present

## 2022-03-10 DIAGNOSIS — G35 Multiple sclerosis: Secondary | ICD-10-CM

## 2022-03-10 NOTE — Progress Notes (Signed)
Chief Complaint:  Taronda Kokes is a 67 y.o. female who presents today for a TCM visit.  Assessment/Plan:  Chronic Problems Addressed Today: SBO (small bowel obstruction) ([place]) Symptoms have resolved since her recent hospitalization.  She is very frustrated by the fact that it seemed like her symptoms were brought on by eating an apple.  She does have follow-up with GI pending to discuss ongoing management.  There is potentially some concern for decreased motility and she will discuss management options for this with GI.  We discussed reasons to return to care.  Her labs were normal prior to discharge -do not need to recheck today.  Essential hypertension Blood pressure at goal today without medications.  MS (multiple sclerosis) ([place]) Follows with neurology.  Overall symptoms are stable though her neurologist was concerned that [place] may potentially be contributing to some decreased gastric and GI motility which could be potentially contributing to her SBO.  She will discuss this further with GI as above.    Subjective:  HPI:  Summary of Hospital admission: Reason for admission: SBO Date of admission: 02/25/2022 Date of discharge: 02/27/2022 Date of Interactive contact: 03/02/2022 Summary of Hospital course: Patient presented to the ED on 02/25/2022 with nausea and abdominal pain.  Found to have SBO on CT and she was admitted for conservative management.  NG tube was initially placed however this was unable to be tolerated and was removed in the ED. Her symptoms improved and her diet was advanced. She was discharged home in stable condition.  Interim history:  Since being home she has been doing well. She ate an apple prior to her hospitalization and thinks that may have precipitated her symptoms. No recurrences.  She does have follow-up with GI pending.  No abdominal pain.  No nausea or vomiting.  ROS: Per HPI, otherwise a complete review of systems was negative.   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   History of ovarian cancer 11/19/2017   History of stomach ulcers    Hyperlipidemia 06/29/2017   Hypertension 06/29/2017   Infection 10/2007   From hysterectomy sutures rupturing   Inguinal hernia    Joint pain    Lactose intolerance    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   Mild intermittent asthma without complication 0000000   Multiple sclerosis ([place]) 06/29/2017   Neuromuscular disorder ([place])    Osteoarthritis    Palpitations    Postmenopausal 06/29/2017   Scoliosis of lumbar spine 06/29/2017   Small bowel obstruction ([employer]) 06/29/2017   Swallowing difficulty    Umbilical hernia    Urticaria    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   Vitamin D deficiency 06/29/2017   Patient Active Problem List   Diagnosis Date Noted   S/P lumbar fusion 05/19/2021   Primary osteoarthritis of right hip 12/27/2020   Osteoarthritis 11/22/2020   Dehydration 08/31/2020   Class 1 obesity with serious comorbidity and body mass index (BMI) of 30.0 to 30.9 in adult 01/26/2020   SBO (small bowel obstruction) ([place])  03/21/2019   Nightmares 03/07/2019   Anxiety 12/20/2018   Cervicalgia 08/18/2018   Cough variant asthma vs UACS 01/03/2018   History of total hysterectomy, due to ovarian cancer 12/26/2017   Genital warts 11/19/2017   History of ovarian cancer 11/19/2017   Seasonal and perennial allergic rhinitis, followed by Allergist, treated with saline rinses and allergy shots 11/02/2017   Mild intermittent asthma without complication 0000000   Hyperlipidemia 06/29/2017    Essential hypertension 06/29/2017   MS (multiple sclerosis) ([place]) 06/29/2017   Scoliosis of lumbar spine 06/29/2017   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   Vitamin D deficiency 06/29/2017   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   Lung mass 06/29/2017   Colon abnormality 05/11/2017   Past Surgical History:  Procedure Laterality Date   ADENOIDECTOMY     CHOLECYSTECTOMY  2009   HERNIA REPAIR     HERNIA REPAIR     6/10, 9/10   HYSTERECTOMY ABDOMINAL WITH SALPINGO-OOPHORECTOMY     HYSTEROTOMY  2008   LAPAROSCOPIC LYSIS OF ADHESIONS     , With small bowel resection and ventral hernia repair with mesh 2019/[place]   MOHS SURGERY     SINOSCOPY     VARICOSE VEIN SURGERY      Family History  Problem Relation Age of Onset   Breast cancer Mother    Cancer Mother    Cervical cancer Maternal Grandmother     Medications- Reconciled discharge and current medications in Epic.  Current Outpatient Medications  Medication Sig Dispense Refill   azelastine (ASTELIN) 0.1 % nasal spray PLACE 2 SPRAYS INTO BOTH NOSTRILS 2 TIMES DAILY. (Patient taking differently: Place 1 spray into both nostrils daily as needed for allergies.) 30 mL 0   celecoxib (CELEBREX) 200 MG capsule Take 1 capsule (200 mg total) by mouth 2 (two) times daily. 60 capsule 5   Cholecalciferol (VITAMIN D3) 1.25 MG (50000 UT) CAPS Take 1 capsule by mouth once a week.     DULoxetine (CYMBALTA) 60 MG capsule Take 60 mg by mouth in the morning.     EPINEPHrine (AUVI-Q) 0.3 mg/0.3 mL IJ SOAJ injection Inject 0.3 mg into the muscle as needed for anaphylaxis. (Patient taking differently: Inject 0.3 mg into the muscle once as needed for anaphylaxis.) 2 each 2   leflunomide (ARAVA) 10 MG tablet Take 10 mg by mouth every evening.     No current facility-administered medications for this visit.    Allergies-reviewed and updated No Known Allergies  Social History   Socioeconomic History    Marital status: Divorced    Spouse name: Not on file   Number of children: Not on file   Years of education: Not on file   Highest education level: Not on file  Occupational History   Not on file  Tobacco Use   Smoking status: Never   Smokeless tobacco: Never  Vaping Use   Vaping Use: Never used  Substance and Sexual Activity   Alcohol use: Not Currently   Drug use: Not Currently   Sexual activity: Not Currently  Other Topics Concern   Not on file  Social History Narrative   The patient does wear seatbelts. The patient does use sunscreen or protective clothing regularly. She does participate in regular exercise. Her exercise is: elliptical, approximately one time per week. She does not have a secured firearm in the home. She denies a history of intimate partner violence.   Social Determinants of Health   Financial  Resource Strain: Low Risk  (08/15/2021)   Overall Financial Resource Strain (CARDIA)    Difficulty of Paying Living Expenses: Not hard at all  Food Insecurity: No Food Insecurity (03/02/2022)   Hunger Vital Sign    Worried About Running Out of Food in the Last Year: Never true    Ran Out of Food in the Last Year: Never true  Transportation Needs: No Transportation Needs (03/02/2022)   PRAPARE - Hydrologist (Medical): No    Lack of Transportation (Non-Medical): No  Physical Activity: Inactive (08/15/2021)   Exercise Vital Sign    Days of Exercise per Week: 0 days    Minutes of Exercise per Session: 0 min  Stress: No Stress Concern Present (08/15/2021)   [place]    Feeling of Stress : Not at all  Social Connections: Socially Isolated (08/15/2021)   Social Connection and Isolation Panel [NHANES]    Frequency of Communication with Friends and Family: More than three times a week    Frequency of Social Gatherings with Friends and Family: More than three times a week    Attends  Religious Services: Never    Marine scientist or Organizations: No    Attends Music therapist: Never    Marital Status: Divorced        Objective:  Physical Exam: BP 139/86   Pulse 64   Temp (!) 97.5 F (36.4 C) (Temporal)   Ht '5\' 4"'$  (1.626 m)   Wt 168 lb (76.2 kg)   SpO2 97%   BMI 28.84 kg/m   Wt Readings from Last 3 Encounters:  03/10/22 168 lb (76.2 kg)  02/25/22 164 lb 0.4 oz (74.4 kg)  12/30/21 164 lb (74.4 kg)  Gen: NAD, resting comfortably CV: RRR with no murmurs appreciated Pulm: NWOB, CTAB with no crackles, wheezes, or rhonchi GI: Normal bowel sounds present. Soft, Nontender, Nondistended. MSK: No edema, cyanosis, or clubbing noted Skin: Warm, dry Neuro: Grossly normal, moves all extremities Psych: Normal affect and thought content  Time Spent: 45 minutes of total time was spent on the date of the encounter performing the following actions: chart review prior to seeing the patient including, obtaining history, performing a medically necessary exam, counseling on the treatment plan, placing orders, and documenting in our EHR.        Algis Greenhouse. Jerline Pain, MD 03/10/2022 9:37 AM

## 2022-03-10 NOTE — Assessment & Plan Note (Signed)
Follows with neurology.  Overall symptoms are stable though her neurologist was concerned that [place] may potentially be contributing to some decreased gastric and GI motility which could be potentially contributing to her SBO.  She will discuss this further with GI as above.

## 2022-03-10 NOTE — Assessment & Plan Note (Signed)
Blood pressure at goal today without medications.

## 2022-03-10 NOTE — Assessment & Plan Note (Signed)
Symptoms have resolved since her recent hospitalization.  She is very frustrated by the fact that it seemed like her symptoms were brought on by eating an apple.  She does have follow-up with GI pending to discuss ongoing management.  There is potentially some concern for decreased motility and she will discuss management options for this with GI.  We discussed reasons to return to care.  Her labs were normal prior to discharge -do not need to recheck today.

## 2022-03-10 NOTE — Patient Instructions (Signed)
It was very nice to see you today!  I am glad that you are feeling better.   We will see you back when you are due for your annual check up.  Please come back sooner if needed.   Take care, Dr Jerline Pain  PLEASE NOTE:  If you had any lab tests, please let us know if you have not heard back within a few days. You may see your results on mychart before we have a chance to review them but we will give you a call once they are reviewed by [place].   If we ordered any referrals today, please let us know if you have not heard from their office within the next week.   If you had any urgent prescriptions sent in today, please check with the pharmacy within an hour of our visit to make sure the prescription was transmitted appropriately.   Please try these tips to maintain a healthy lifestyle:  Eat at least 3 REAL meals and 1-2 snacks per day.  Aim for no more than 5 hours between eating.  If you eat breakfast, please do so within one hour of getting up.   Each meal should contain half fruits/vegetables, one quarter protein, and one quarter carbs (no bigger than a computer mouse)  Cut down on sweet beverages. This includes juice, soda, and sweet tea.   Drink at least 1 glass of water with each meal and aim for at least 8 glasses per day  Exercise at least 150 minutes every week.

## 2022-03-18 DIAGNOSIS — K56699 Other intestinal obstruction unspecified as to partial versus complete obstruction: Secondary | ICD-10-CM | POA: Diagnosis not present

## 2022-03-18 DIAGNOSIS — K913 Postprocedural intestinal obstruction, unspecified as to partial versus complete: Secondary | ICD-10-CM | POA: Diagnosis not present

## 2022-03-18 DIAGNOSIS — G35 Multiple sclerosis: Secondary | ICD-10-CM | POA: Diagnosis not present

## 2022-03-18 DIAGNOSIS — Z79631 Long term (current) use of antimetabolite agent: Secondary | ICD-10-CM | POA: Diagnosis not present

## 2022-03-19 ENCOUNTER — Encounter: Payer: Self-pay | Admitting: Family Medicine

## 2022-04-02 ENCOUNTER — Ambulatory Visit (INDEPENDENT_AMBULATORY_CARE_PROVIDER_SITE_OTHER): Payer: Medicare Other

## 2022-04-02 DIAGNOSIS — J309 Allergic rhinitis, unspecified: Secondary | ICD-10-CM | POA: Diagnosis not present

## 2022-04-09 ENCOUNTER — Ambulatory Visit (INDEPENDENT_AMBULATORY_CARE_PROVIDER_SITE_OTHER): Payer: Medicare Other

## 2022-04-09 DIAGNOSIS — J309 Allergic rhinitis, unspecified: Secondary | ICD-10-CM

## 2022-04-16 ENCOUNTER — Ambulatory Visit (INDEPENDENT_AMBULATORY_CARE_PROVIDER_SITE_OTHER): Payer: Medicare Other

## 2022-04-16 DIAGNOSIS — J309 Allergic rhinitis, unspecified: Secondary | ICD-10-CM

## 2022-04-23 ENCOUNTER — Ambulatory Visit (INDEPENDENT_AMBULATORY_CARE_PROVIDER_SITE_OTHER): Payer: Medicare Other

## 2022-04-23 DIAGNOSIS — J309 Allergic rhinitis, unspecified: Secondary | ICD-10-CM | POA: Diagnosis not present

## 2022-04-30 ENCOUNTER — Ambulatory Visit (INDEPENDENT_AMBULATORY_CARE_PROVIDER_SITE_OTHER): Payer: Medicare Other

## 2022-04-30 DIAGNOSIS — J309 Allergic rhinitis, unspecified: Secondary | ICD-10-CM | POA: Diagnosis not present

## 2022-05-07 ENCOUNTER — Ambulatory Visit (INDEPENDENT_AMBULATORY_CARE_PROVIDER_SITE_OTHER): Payer: Medicare Other

## 2022-05-07 DIAGNOSIS — J309 Allergic rhinitis, unspecified: Secondary | ICD-10-CM | POA: Diagnosis not present

## 2022-05-14 ENCOUNTER — Ambulatory Visit (INDEPENDENT_AMBULATORY_CARE_PROVIDER_SITE_OTHER): Payer: Medicare Other

## 2022-05-14 DIAGNOSIS — J309 Allergic rhinitis, unspecified: Secondary | ICD-10-CM

## 2022-05-19 ENCOUNTER — Encounter: Payer: Self-pay | Admitting: Family Medicine

## 2022-05-19 ENCOUNTER — Ambulatory Visit (INDEPENDENT_AMBULATORY_CARE_PROVIDER_SITE_OTHER): Payer: Medicare Other | Admitting: Family Medicine

## 2022-05-19 VITALS — BP 139/75 | HR 68 | Temp 98.0°F | Ht 64.0 in | Wt 172.8 lb

## 2022-05-19 DIAGNOSIS — E559 Vitamin D deficiency, unspecified: Secondary | ICD-10-CM

## 2022-05-19 DIAGNOSIS — I10 Essential (primary) hypertension: Secondary | ICD-10-CM | POA: Diagnosis not present

## 2022-05-19 DIAGNOSIS — M1990 Unspecified osteoarthritis, unspecified site: Secondary | ICD-10-CM

## 2022-05-19 DIAGNOSIS — M1612 Unilateral primary osteoarthritis, left hip: Secondary | ICD-10-CM | POA: Diagnosis not present

## 2022-05-19 MED ORDER — VITAMIN D3 1.25 MG (50000 UT) PO CAPS: 1.0000 | ORAL_CAPSULE | ORAL | 4 refills | Status: DC

## 2022-05-19 NOTE — Assessment & Plan Note (Signed)
Pressure at goal today off medications.

## 2022-05-19 NOTE — Assessment & Plan Note (Addendum)
Needs a refill today. She has previously gotten this from neurology but has had some difficulty getting a refill from them. Will give a courtesy refill today.

## 2022-05-19 NOTE — Patient Instructions (Addendum)
It was very nice to see you today!  I will refer you to see Dr Denyse Amass.   We will refill your vitamin D.   Return if symptoms worsen or fail to improve.   Take care, Dr [name]  PLEASE NOTE:  If you had any lab tests, please let us know if you have not heard back within a few days. You may see your results on mychart before we have a chance to review them but we will give you a call once they are reviewed by [place].   If we ordered any referrals today, please let us know if you have not heard from their office within the next week.   If you had any urgent prescriptions sent in today, please check with the pharmacy within an hour of our visit to make sure the prescription was transmitted appropriately.   Please try these tips to maintain a healthy lifestyle:  Eat at least 3 REAL meals and 1-2 snacks per day.  Aim for no more than 5 hours between eating.  If you eat breakfast, please do so within one hour of getting up.   Each meal should contain half fruits/vegetables, one quarter protein, and one quarter carbs (no bigger than a computer mouse)  Cut down on sweet beverages. This includes juice, soda, and sweet tea.   Drink at least 1 glass of water with each meal and aim for at least 8 glasses per day  Exercise at least 150 minutes every week.

## 2022-05-19 NOTE — Progress Notes (Signed)
   Madasin Demello is a 67 y.o. female who presents today for an office visit.  Assessment/Plan:  New/Acute Problems: Left Hip Pain No red flags. May have some underlying osteoarthritis. She is already on Celebrex 200 mg twice daily. She would like to go back to see sports medicine. She has done well with them in the past. Will place referral today.   Chronic Problems Addressed Today: Osteoarthritis Pain and stiffness are worsening especially in bilateral hands. She is already on Celebrex 200 mg twice daily. Did not have much benefit from voltaren in the past. Will be referring to sports medicine as above.   Essential hypertension Pressure at goal today off medications.   Vitamin D deficiency Needs a refill today. She has previously gotten this from neurology but has had some difficulty getting a refill from them. Will give a courtesy refill today.      Subjective:  HPI:  See Assessment / plan for status of chronic conditions.  Her main concern today is left hip pain. This has been going on for the last several weeks to a month. Worse when going from standing to sitting or sitting to standing. She is on Celebrex routinely but she is not sure if it is helping. She is concerned that she may have arthritis in her hip. No falls or other obvious aggravating factors.  She has also been having some issues with right hand pain. This has also been worsening. She is having more stiffness and difficulty close her hand. She has been consistent with her Celebrex as above without much improvement.        Objective:  Physical Exam: BP 139/75   Pulse 68   Temp 98 F (36.7 C) (Temporal)   Ht 5\' 4"  (1.626 m)   Wt 172 lb 12.8 oz (78.4 kg)   SpO2 98%   BMI 29.66 kg/m   Gen: No acute distress, resting comfortably CV: Regular rate and rhythm with no murmurs appreciated Pulm: Normal work of breathing, clear to auscultation bilaterally with no crackles, wheezes, or rhonchi MUSCULOSKELETAL: -  Hands: Bony hypertrophy noted. Limited ROM - Hip: No deformities. Pain with internal and external rotation. Strength intact. Neurovascularly intact distally.  Neuro: Grossly normal, moves all extremities Psych: Normal affect and thought content      Braylee Bosher M. [name], MD 05/19/2022 11:36 AM

## 2022-05-19 NOTE — Assessment & Plan Note (Signed)
Pain and stiffness are worsening especially in bilateral hands. She is already on Celebrex 200 mg twice daily. Did not have much benefit from voltaren in the past. Will be referring to sports medicine as above.

## 2022-05-20 NOTE — Progress Notes (Signed)
   Rubin Payor, PhD, LAT, ATC acting as a scribe for Clementeen Graham, MD.  Emily Wolfe is a 67 y.o. female who presents to [facility] at [facility] today for L hip and bilat hand pain. Pt was last seen by Dr. Denyse Amass on 12/30/21 and for bilat shoulders and low back pain.   Today, pt c/o ***. Pt locates pain to   Dx imaging: 05/19/21 L-spine XR  04/08/21 L-spine & R hip MRI  03/30/21 R hip CT  Pertinent review of systems: ***  Relevant historical information: ***   Exam:  There were no vitals taken for this visit. General: Well Developed, well nourished, and in no acute distress.   MSK: ***    Lab and Radiology Results No results found for this or any previous visit (from the past 72 hour(s)). No results found.     Assessment and Plan: 67 y.o. female with ***   PDMP not reviewed this encounter. No orders of the defined types were placed in this encounter.  No orders of the defined types were placed in this encounter.    Discussed warning signs or symptoms. Please see discharge instructions. Patient expresses understanding.   ***

## 2022-05-21 ENCOUNTER — Ambulatory Visit (INDEPENDENT_AMBULATORY_CARE_PROVIDER_SITE_OTHER): Payer: Medicare Other

## 2022-05-21 ENCOUNTER — Other Ambulatory Visit (INDEPENDENT_AMBULATORY_CARE_PROVIDER_SITE_OTHER): Payer: Medicare Other

## 2022-05-21 ENCOUNTER — Ambulatory Visit: Payer: Medicare Other | Admitting: Family Medicine

## 2022-05-21 ENCOUNTER — Ambulatory Visit (INDEPENDENT_AMBULATORY_CARE_PROVIDER_SITE_OTHER)
Admission: RE | Admit: 2022-05-21 | Discharge: 2022-05-21 | Disposition: A | Discharge status: Home / Self Care | Payer: Medicare Other | Source: Ambulatory Visit | Attending: Family Medicine | Admitting: Family Medicine

## 2022-05-21 ENCOUNTER — Encounter: Payer: Self-pay | Admitting: Family Medicine

## 2022-05-21 VITALS — BP 150/90 | HR 74 | Ht 64.0 in | Wt 173.8 lb

## 2022-05-21 DIAGNOSIS — M79642 Pain in left hand: Secondary | ICD-10-CM

## 2022-05-21 DIAGNOSIS — M7062 Trochanteric bursitis, left hip: Secondary | ICD-10-CM

## 2022-05-21 DIAGNOSIS — M25552 Pain in left hip: Secondary | ICD-10-CM

## 2022-05-21 DIAGNOSIS — M79641 Pain in right hand: Secondary | ICD-10-CM

## 2022-05-21 DIAGNOSIS — J309 Allergic rhinitis, unspecified: Secondary | ICD-10-CM | POA: Diagnosis not present

## 2022-05-21 LAB — SEDIMENTATION RATE: Sed Rate: 20 mm/hr (ref 0–30)

## 2022-05-21 LAB — CK: Total CK: 100 U/L (ref 7–177)

## 2022-05-21 NOTE — Patient Instructions (Addendum)
Thank you for coming in today.   Please get an Xray today before you leave   I've referred you to Hand Therapy.  Let us know if you don't hear from them in one week.   Please get labs today before you leave   You received an injection today. Seek immediate medical attention if the joint becomes red, extremely painful, or is oozing fluid.   Check back in 6-8 weeks

## 2022-05-22 LAB — ANA: Anti Nuclear Antibody (ANA): NEGATIVE

## 2022-05-22 NOTE — Progress Notes (Signed)
So far labs look okay.  Other labs are still pending.

## 2022-05-23 ENCOUNTER — Encounter: Payer: Self-pay | Admitting: Family Medicine

## 2022-05-23 DIAGNOSIS — M25552 Pain in left hip: Secondary | ICD-10-CM

## 2022-05-23 DIAGNOSIS — M5416 Radiculopathy, lumbar region: Secondary | ICD-10-CM

## 2022-05-23 LAB — CYCLIC CITRUL PEPTIDE ANTIBODY, IGG: Cyclic Citrullin Peptide Ab: <16 UNITS

## 2022-05-23 LAB — RHEUMATOID FACTOR: Rheumatoid fact SerPl-aCnc: <10 IU/mL (ref <14)

## 2022-05-25 NOTE — Progress Notes (Signed)
All rheumatology labs came back normal.

## 2022-05-25 NOTE — Telephone Encounter (Signed)
Forwarding to Dr. Denyse Amass to advise.   Diagnostic Imaging 05/19/21 - XR L-Spine 04/08/21 - MRI L-Spine 04/01/21 - MR R Hip 03/30/21 CT R Hip 11/22/20 - XR R Hip

## 2022-05-26 DIAGNOSIS — J302 Other seasonal allergic rhinitis: Secondary | ICD-10-CM | POA: Diagnosis not present

## 2022-05-26 NOTE — Progress Notes (Signed)
Right hand x-ray shows arthritis worse at the base of the thumb.

## 2022-05-26 NOTE — Progress Notes (Signed)
Left hand x-ray shows arthritis at the base of the thumb.

## 2022-05-26 NOTE — Addendum Note (Signed)
Addended by: Dierdre Searles on: 05/26/2022 11:24 AM   Modules accepted: Orders

## 2022-05-26 NOTE — Progress Notes (Signed)
VIALS EXP 05-26-23 

## 2022-05-27 DIAGNOSIS — J301 Allergic rhinitis due to pollen: Secondary | ICD-10-CM | POA: Diagnosis not present

## 2022-05-28 ENCOUNTER — Ambulatory Visit (INDEPENDENT_AMBULATORY_CARE_PROVIDER_SITE_OTHER): Payer: Medicare Other

## 2022-05-28 DIAGNOSIS — J309 Allergic rhinitis, unspecified: Secondary | ICD-10-CM | POA: Diagnosis not present

## 2022-05-28 NOTE — Telephone Encounter (Signed)
Forwarding to Dr. Denyse Amass as Gabapentin is not listed on current medication list.

## 2022-05-29 MED ORDER — GABAPENTIN 300 MG PO CAPS: 300.0000 mg | ORAL_CAPSULE | Freq: Three times a day (TID) | ORAL | 3 refills | Status: AC | PRN

## 2022-05-29 NOTE — Addendum Note (Signed)
Addended by: Rodolph Bong on: 05/29/2022 06:56 AM   Modules accepted: Orders

## 2022-05-30 ENCOUNTER — Ambulatory Visit (INDEPENDENT_AMBULATORY_CARE_PROVIDER_SITE_OTHER): Payer: Medicare Other

## 2022-05-30 DIAGNOSIS — M25552 Pain in left hip: Secondary | ICD-10-CM | POA: Diagnosis not present

## 2022-05-30 DIAGNOSIS — R600 Localized edema: Secondary | ICD-10-CM | POA: Diagnosis not present

## 2022-06-04 ENCOUNTER — Ambulatory Visit (INDEPENDENT_AMBULATORY_CARE_PROVIDER_SITE_OTHER): Payer: Medicare Other

## 2022-06-04 DIAGNOSIS — J309 Allergic rhinitis, unspecified: Secondary | ICD-10-CM

## 2022-06-08 NOTE — Therapy (Incomplete)
OUTPATIENT OCCUPATIONAL THERAPY ORTHO EVALUATION  Patient Name: Sarinity Stansfield MRN: [id number] DOB:06/30/1955, 68 y.o., female Today's Date: 06/09/2022  PCP: Jacquiline Doe, MD REFERRING PROVIDER:  Rodolph Bong, MD    END OF SESSION:  OT End of Session - 06/09/22 0932     Visit Number 1    Number of Visits 10    Date for OT Re-Evaluation 07/24/22    Authorization Type [employer]    OT Start Time 0933    OT Stop Time 1018    OT Time Calculation (min) 45 min    Activity Tolerance Patient tolerated treatment well;No increased pain;Patient limited by pain;Patient limited by fatigue    Behavior During Therapy [facility] for tasks assessed/performed             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   Mild intermittent asthma without complication 11/02/2017   Multiple sclerosis (HCC) 06/29/2017   Neuromuscular disorder (HCC)    Osteoarthritis    Palpitations    Postmenopausal 06/29/2017   Scoliosis of lumbar spine 06/29/2017   Small bowel obstruction (HCC) 06/29/2017   Swallowing difficulty    Umbilical hernia    Urticaria    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   Vitamin D deficiency 06/29/2017   Past Surgical History:  Procedure Laterality Date   ADENOIDECTOMY     CHOLECYSTECTOMY  2009   HERNIA REPAIR     HERNIA REPAIR     6/10, 9/10   HYSTERECTOMY ABDOMINAL WITH  SALPINGO-OOPHORECTOMY     HYSTEROTOMY  2008   LAPAROSCOPIC LYSIS OF ADHESIONS     , With small bowel resection and ventral hernia repair with mesh 2019/[place]   MOHS SURGERY     SINOSCOPY     VARICOSE VEIN SURGERY     Patient Active Problem List   Diagnosis Date Noted   S/P lumbar fusion 05/19/2021   Primary osteoarthritis of right hip 12/27/2020   Osteoarthritis 11/22/2020   Dehydration 08/31/2020   Class 1 obesity with serious comorbidity and body mass index (BMI) of 30.0 to 30.9 in adult 01/26/2020   SBO (small bowel obstruction) (HCC) 03/21/2019   Nightmares 03/07/2019   Anxiety 12/20/2018   Cervicalgia 08/18/2018   Cough variant asthma vs UACS 01/03/2018   History of total hysterectomy, due to ovarian cancer 12/26/2017   Genital warts 11/19/2017   History of ovarian cancer 11/19/2017   Seasonal and perennial allergic rhinitis, followed by Allergist, treated with saline rinses and allergy shots 11/02/2017   Mild intermittent asthma without complication 11/02/2017   Hyperlipidemia 06/29/2017   Essential hypertension 06/29/2017   MS (multiple sclerosis) (HCC) 06/29/2017   Scoliosis of lumbar spine 06/29/2017   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   Vitamin D deficiency 06/29/2017   History of  infection due to human papilloma virus (HPV) 06/29/2017   Lung mass 06/29/2017   Colon abnormality 05/11/2017    ONSET DATE: Chronic  REFERRING DIAG: M79.641,M79.642 (ICD-10-CM) - Bilateral hand pain   THERAPY DIAG:  Other lack of coordination - Plan: Ot plan of care cert/re-cert  Muscle weakness (generalized) - Plan: Ot plan of care cert/re-cert  Pain in left hand - Plan: Ot plan of care cert/re-cert  Pain in right hand - Plan: Ot plan of care cert/re-cert  Rationale for Evaluation and Treatment: Rehabilitation  SUBJECTIVE:   SUBJECTIVE STATEMENT: She states years of OA in her whole body- hips, knees, shoulders, etc. All of the joints of her hand hurt her,  and she states that she "doesn't like to" bend her fingers completely because of the pain, and she does limit herself.  She states using a lot of meds, if needed, to help control the pain. She is also managing chronic MS symptoms. States Rt hand MF is most stiff/painful.  She is pleasant and talkative and discusses much of her chronic issues today.  This somewhat limits time for evaluation.   PERTINENT HISTORY: MS, lumbar spinal fusion L3/4 05/19/21, hx of ovarian cancer and 8 gut surgeries. Hernias, foot drop on right (when fatigued), fainting spells.  Per MD notes: "L hip and bilat hand pain. Pt was last seen by Dr. Denyse Amass on 12/30/21 and for bilat shoulders and low back pain.    Today, pt c/o weakness on the left side of the body. Notes pain in the left hip, worse with stand-to-sit and external rotation. C/O pain. C/o tightness, swelling, and limited ROM in BILAT hands. Bilat shoulder pain has improved with PT. Continues using the reformer with Pilates. Has concerns about weight gain since recent surgery.  X-ray images bilateral hands obtained today personally and independently interpreted  Right hand: First CMC DJD.  No fractures or erosions are present.  Left hand: First CMC DJD.  Mild DJD second MCP.  No acute fractures are visible.  No erosions are visible.  Bilateral hand pain and stiffness thought to be due to either degenerative arthritis or perhaps an inflammatory arthritis pattern. The swelling and ulnar deviation across MCPs is somewhat concerning for RA. Plan for hand x-rays and a limited rheumatologic workup listed below. "    PRECAUTIONS: Other: hx of pain in major joints of body ; WEIGHT BEARING RESTRICTIONS: No  PAIN:  Are you having pain? Not now at rest without motion, but up to 3-4/10 in past 2 weeks due to hip pain (awaiting an MRI, she's on nerve and painkillers for hip and masking hand pain)   FALLS: Has patient fallen in last 6 months? No  LIVING ENVIRONMENT: Lives with:  lives alone Has following equipment at home:  a variety of home devices to help with difficult hand tasks  PLOF: Independent  PATIENT GOALS: To help manage hand pain, issues    OBJECTIVE: (All objective assessments below are from initial evaluation on: 06/09/22 unless otherwise specified.)   HAND DOMINANCE: Right   ADLs: Overall ADLs: States having some pain in her hands when trying to cut up food for meal prep, doing forceful or recreational activities in the yard or when refinishing furniture (see does for a side job as well as leisure)  FUNCTIONAL OUTCOME MEASURES: Eval: Quck DASH 14% impairment today  (Higher % Score  =  More Impairment)     UPPER EXTREMITY ROM     Shoulder to Wrist AROM Right eval Left  eval  Wrist flexion 83 63  Wrist extension 62 71 (thumb pain)  (Blank rows = not tested)   Hand AROM Right eval Left eval  Full Fist Ability (or Gap to Distal Palmar Crease) Full but pain Full   Thumb Opposition  (Kapandji Scale)  10 but pain 10 but pain  (Blank rows = not tested)   UPPER EXTREMITY MMT:    Eval:  NT at eval due to pain and time limitations. Will be tested when appropriate.   MMT Right TBD Left TBD  Forearm supination    Forearm pronation    Wrist flexion    Wrist extension    (Blank rows = not tested)  HAND FUNCTION: Eval: Observed weakness in affected hand.  Grip strength Right: painful 45 lbs, Left: painful 42 lbs   COORDINATION: Eval: Observed coordination impairments with affected hand. 9 Hole Peg Test Right: 37sec (multiple drops), Left: 24.5 sec (1 drop)   SENSATION: Eval:  Light touch intact today, though she states that she has bouts of numbness at times that coincide with MS flareup.  EDEMA:   Eval: Mildly swollen in bil hands at the MCP joints as well as in the IP joints as well.  COGNITION: Eval: Overall cognitive status: WFL for evaluation today  OBSERVATIONS:   Eval: mild ulnar drift bil, Lt thumb pain (has had  DeQuervain's in past), Rt MF stiff & pain, poor FMS coord noted bil (may be more MS related), no overt instability.  Full fist ability and full opposition ability but painful.   TODAY'S TREATMENT:  Post-evaluation treatment:  OT educates for self-management of symptoms to perform warm water soaks once or twice a day, especially every morning for her hands.  She was also recommended to use some type of bracing to help reduce wear and tear of the joints of her hand and thumb and wrist as long she is doing heavy labor-intensive jobs like scrubbing down furniture and refinishing it, or working in the yard.  Next to address her coordination issues, she is given in hand manipulation activities to work with the pen to perform in translation, rotation, shift.  She is only briefly shown the is so they will need additional review.  She will also need additional HEP stretches for her wrists and hands in the next session, but she states understanding education given today.   PATIENT EDUCATION: Education details: See tx section above for details  Person educated: Patient Education method: Verbal Instruction, Teach back, Handouts  Education comprehension: States and demonstrates understanding, Additional Education required    HOME EXERCISE PROGRAM: See tx section above for details    GOALS: Goals reviewed with patient? Yes   SHORT TERM GOALS: (STG required if POC>30 days) Target Date: 06/26/22  Pt will obtain protective, custom orthotic. Goal status: TBD/PRN  2.  Pt will demo/state understanding of initial HEP to improve pain levels and prerequisite motion. Goal status: INITIAL   LONG TERM GOALS: Target Date: 07/24/22  Pt will improve functional ability by decreased impairment per Quick DASH assessment from 14% to 8% or better, for better quality of life. Goal status: INITIAL  2.  Pt will improve grip strength in bil hands from painful (approx 45lbs) to non-painful approx 45-50 lbs for  functional use at home and in IADLs. Goal status: INITIAL  3.  Pt will improve A/ROM in Lt wrist flexion from 63 to at least 73, to have functional motion for tasks like reach and grasp.  Goal status: INITIAL  4.  Pt will improve coordination skills in Rt hands FMS, as seen by better score by at least 10 sec on 9HPT testing to have increased functional ability to carry out fine motor tasks (fasteners, etc.) and more complex, coordinated IADLs (meal prep, sports, etc.).  Goal status: INITIAL  6.  Pt will decrease pain at worst from 3-4/10 to 2/10 or better to have better sleep and occupational participation in daily roles. Goal status: INITIAL   ASSESSMENT:  CLINICAL IMPRESSION: Patient is a 67 y.o. female who was seen today for occupational therapy evaluation for bilateral hand stiffness and pain with daily activities and higher level IADLs.  This evaluation was somewhat difficult on as she states recently having left hip problems, awaiting MRI, and self-medicating which is somewhat masking her hand symptoms today.  She needs strategies to help her likely with MS, coordination, hand maintenance as emphasized by her statements of having "high pain tolerance and pushing herself hard every day."  She is somewhat resistant to the idea that she should "pace herself" due to MS typical fatigue symptoms.  She does seem to stop or modify tasks as needed which is very positive.  She will benefit from outpatient occupational therapy to decrease symptoms and increase quality of life.  PERFORMANCE DEFICITS: in functional skills including IADLs, coordination, dexterity, ROM, strength, pain, muscle spasms, flexibility, Fine motor control, body mechanics, endurance, decreased knowledge of precautions, and UE functional use, cognitive skills including problem solving, safety awareness, and temperament/personality, and psychosocial skills including coping strategies, environmental adaptation, habits, and routines  and behaviors.   IMPAIRMENTS: are limiting patient from ADLs, IADLs, work, and leisure.   COMORBIDITIES: may have co-morbidities  that affects occupational performance. Patient will benefit from skilled OT to address above impairments and improve overall function.  MODIFICATION OR ASSISTANCE TO COMPLETE EVALUATION: No modification of tasks or assist necessary to complete an evaluation.  OT OCCUPATIONAL PROFILE AND HISTORY: Detailed assessment: Review of records and additional review of physical, cognitive, psychosocial history related to current functional performance.  CLINICAL DECISION MAKING: LOW - limited treatment options, no task modification necessary  REHAB POTENTIAL: Good  EVALUATION COMPLEXITY: Low      PLAN:  OT FREQUENCY: 1-2x/week (starting 1x, moving up to 2x if needed)  OT DURATION: 6 weeks  PLANNED INTERVENTIONS: self care/ADL training, therapeutic exercise, therapeutic activity, neuromuscular re-education, manual therapy, passive range of motion, splinting, fluidotherapy, compression bandaging, moist heat, cryotherapy, contrast bath, patient/family education, energy conservation, coping strategies training, DME and/or AE instructions, and Dry needling  RECOMMENDED OTHER SERVICES: none now- already having PT for bil sh pains   CONSULTED AND AGREED WITH PLAN OF CARE: Patient  PLAN FOR NEXT SESSION:  check sensation, check initial fine motor skills recommendations continue to discuss supportive bracing or try kinesiotaping, assign hand and wrist stretches for arthritis management.   Fannie Knee, OTR/L, CHT 06/09/2022, 2:18 PM

## 2022-06-09 ENCOUNTER — Other Ambulatory Visit: Payer: Self-pay

## 2022-06-09 ENCOUNTER — Encounter: Payer: Self-pay | Admitting: Rehabilitative and Restorative Service Providers"

## 2022-06-09 ENCOUNTER — Ambulatory Visit: Payer: Medicare Other | Admitting: Rehabilitative and Restorative Service Providers"

## 2022-06-09 DIAGNOSIS — M6281 Muscle weakness (generalized): Secondary | ICD-10-CM | POA: Diagnosis not present

## 2022-06-09 DIAGNOSIS — M79641 Pain in right hand: Secondary | ICD-10-CM

## 2022-06-09 DIAGNOSIS — R278 Other lack of coordination: Secondary | ICD-10-CM

## 2022-06-09 DIAGNOSIS — M79642 Pain in left hand: Secondary | ICD-10-CM | POA: Diagnosis not present

## 2022-06-12 ENCOUNTER — Ambulatory Visit (INDEPENDENT_AMBULATORY_CARE_PROVIDER_SITE_OTHER): Payer: Medicare Other | Admitting: *Deleted

## 2022-06-12 DIAGNOSIS — J309 Allergic rhinitis, unspecified: Secondary | ICD-10-CM

## 2022-06-15 NOTE — Progress Notes (Signed)
Left hip MRI shows a little bit of arthritis in multiple different irritations or tendinitis tears all around the hip.  If you are not getting better on your own it may be best to have dedicated physical therapy for this.

## 2022-06-16 ENCOUNTER — Encounter: Payer: Medicare Other | Admitting: Rehabilitative and Restorative Service Providers"

## 2022-06-16 ENCOUNTER — Other Ambulatory Visit: Payer: Self-pay

## 2022-06-16 DIAGNOSIS — M25552 Pain in left hip: Secondary | ICD-10-CM

## 2022-06-17 DIAGNOSIS — M6281 Muscle weakness (generalized): Secondary | ICD-10-CM | POA: Diagnosis not present

## 2022-06-17 DIAGNOSIS — M25552 Pain in left hip: Secondary | ICD-10-CM | POA: Diagnosis not present

## 2022-06-18 ENCOUNTER — Ambulatory Visit (INDEPENDENT_AMBULATORY_CARE_PROVIDER_SITE_OTHER): Payer: Medicare Other

## 2022-06-18 DIAGNOSIS — J309 Allergic rhinitis, unspecified: Secondary | ICD-10-CM | POA: Diagnosis not present

## 2022-06-23 ENCOUNTER — Encounter: Payer: Medicare Other | Admitting: Rehabilitative and Restorative Service Providers"

## 2022-06-23 DIAGNOSIS — M25552 Pain in left hip: Secondary | ICD-10-CM | POA: Diagnosis not present

## 2022-06-23 DIAGNOSIS — M6281 Muscle weakness (generalized): Secondary | ICD-10-CM | POA: Diagnosis not present

## 2022-06-25 DIAGNOSIS — M6281 Muscle weakness (generalized): Secondary | ICD-10-CM | POA: Diagnosis not present

## 2022-06-25 DIAGNOSIS — M25552 Pain in left hip: Secondary | ICD-10-CM | POA: Diagnosis not present

## 2022-06-26 ENCOUNTER — Ambulatory Visit (INDEPENDENT_AMBULATORY_CARE_PROVIDER_SITE_OTHER): Payer: Medicare Other

## 2022-06-26 DIAGNOSIS — J309 Allergic rhinitis, unspecified: Secondary | ICD-10-CM | POA: Diagnosis not present

## 2022-06-30 ENCOUNTER — Encounter: Payer: Medicare Other | Admitting: Rehabilitative and Restorative Service Providers"

## 2022-06-30 DIAGNOSIS — M6281 Muscle weakness (generalized): Secondary | ICD-10-CM | POA: Diagnosis not present

## 2022-06-30 DIAGNOSIS — M25552 Pain in left hip: Secondary | ICD-10-CM | POA: Diagnosis not present

## 2022-07-02 ENCOUNTER — Ambulatory Visit: Payer: Medicare Other | Admitting: Family Medicine

## 2022-07-02 NOTE — Progress Notes (Deleted)
   Rubin Payor, PhD, LAT, ATC acting as a scribe for Clementeen Graham, MD.  Emily Wolfe is a 67 y.o. female who presents to [facility] at [facility] today for f/u L hip and bilat hand pain w/ hip MRI review. Pt was last seen by Dr. Denyse Amass on 05/21/22 and she was given a L GT steroid injection, XR and lab were obtained, and she was referred to hand therapy, completing only the initial evaluation (canceling all remaining visits). She sent a MyChart message on May 18th reporting worsening L hip pain and she was referred to [facility] PT and a L hip MRI was ordered.   Today, pt reports ***  Dx testing: 05/30/22 L hip MRI  05/21/22 R & L hand XR and labs  Pertinent review of systems: ***  Relevant historical information: ***   Exam:  There were no vitals taken for this visit. General: Well Developed, well nourished, and in no acute distress.   MSK: ***    Lab and Radiology Results No results found for this or any previous visit (from the past 72 hour(s)). No results found.     Assessment and Plan: 67 y.o. female with ***   PDMP not reviewed this encounter. No orders of the defined types were placed in this encounter.  No orders of the defined types were placed in this encounter.    Discussed warning signs or symptoms. Please see discharge instructions. Patient expresses understanding.   ***

## 2022-07-03 DIAGNOSIS — M6281 Muscle weakness (generalized): Secondary | ICD-10-CM | POA: Diagnosis not present

## 2022-07-03 DIAGNOSIS — M25552 Pain in left hip: Secondary | ICD-10-CM | POA: Diagnosis not present

## 2022-07-07 ENCOUNTER — Other Ambulatory Visit: Payer: Self-pay

## 2022-07-07 ENCOUNTER — Ambulatory Visit: Payer: Medicare Other | Admitting: Family Medicine

## 2022-07-07 ENCOUNTER — Encounter: Payer: Medicare Other | Admitting: Rehabilitative and Restorative Service Providers"

## 2022-07-07 VITALS — BP 132/76 | HR 69 | Ht 64.0 in | Wt 170.0 lb

## 2022-07-07 DIAGNOSIS — M6281 Muscle weakness (generalized): Secondary | ICD-10-CM | POA: Diagnosis not present

## 2022-07-07 DIAGNOSIS — M25552 Pain in left hip: Secondary | ICD-10-CM | POA: Diagnosis not present

## 2022-07-07 NOTE — Patient Instructions (Addendum)
Thank you for coming in today.   You received an injection today. Seek immediate medical attention if the joint becomes red, extremely painful, or is oozing fluid.   If not better next step is back injection.

## 2022-07-07 NOTE — Progress Notes (Signed)
Rubin Payor, PhD, LAT, ATC acting as a scribe for Emily Graham, MD.  Emily Wolfe is a 67 y.o. female who presents to [facility] at [facility] today for f/u L hip and bilat hand pain w/ hip MRI review. Pt was last seen by Dr. Denyse Amass on 05/21/22 and she was given a L GT steroid injection, XR and lab were obtained, and she was referred to hand therapy, completing only the initial evaluation (canceling all remaining visits). She sent a MyChart message on May 18th reporting worsening L hip pain and she was referred to [facility] PT and a L hip MRI was ordered.   Today, pt reports L hip is still therapy. She stopped hand therapy because she is overloaded w/ PT and pilates. No benefit from prior GT steroid injection. The L hip pain has been exacerbating by her PT treatment earlier today.     Dx testing: 05/30/22 L hip MRI  05/21/22 R & L hand XR and labs    Pertinent review of systems: No fevers or chills  Relevant historical information: MS.  History of lumbar fusion over a year ago.   Exam:  BP 132/76   Pulse 69   Ht 5\' 4"  (1.626 m)   Wt 170 lb (77.1 kg)   SpO2 98%   BMI 29.18 kg/m  General: Well Developed, well nourished, and in no acute distress.   MSK: Left hip normal appearing. Mildly tender palpation greater trochanter.  Mildly tender patient ischial tuberosity.  Some pain with resisted abduction and extension of the hip and knee flexion.    Lab and Radiology Results  Procedure: Real-time Ultrasound Guided Injection of left hamstring ischial tuberosity bursa and hamstring origin Device: Philips Affiniti 50G/GE Logiq Images permanently stored and available for review in PACS Verbal informed consent obtained.  Discussed risks and benefits of procedure. Warned about infection, bleeding, hyperglycemia damage to structures among others. Patient expresses understanding and agreement Time-out conducted.   Noted no overlying erythema, induration, or other  signs of local infection.   Skin prepped in a sterile fashion.   Local anesthesia: Topical Ethyl chloride.   With sterile technique and under real time ultrasound guidance: 40 mg of Kenalog and 2 mL of Marcaine injected into ischial tuberosity bursa and hamstring origin. Fluid seen entering the bursa.   Completed without difficulty   Pain moderately resolved suggesting accurate placement of the medication.   Advised to call if fevers/chills, erythema, induration, drainage, or persistent bleeding.   Images permanently stored and available for review in the ultrasound unit.  Impression: Technically successful ultrasound guided injection.    EXAM: MR OF THE LEFT HIP WITHOUT CONTRAST   TECHNIQUE: Multiplanar, multisequence MR imaging was performed. No intravenous contrast was administered.   COMPARISON:  CT 02/26/2022   FINDINGS: Bones: No acute fracture. No dislocation. No femoral head avascular necrosis. Bony pelvis intact without diastasis. SI joints and pubic symphysis within normal limits. No bone marrow edema. Partially imaged lower lumbar degenerative spondylosis. No marrow replacing bone lesion.   Articular cartilage and labrum   Articular cartilage: Mild chondral thinning without focal cartilage defect. No subchondral marrow signal changes.   Labrum: Superior labrum appears degenerated, evaluation limited in the absence of intra-articular contrast. No paralabral cyst.   Joint or bursal effusion   Joint effusion:  None.   Bursae: No abnormal bursal fluid collection.   Muscles and tendons   Muscles and tendons: Intramuscular edema within the proximal left adductor muscle  compartment. There is some fluid signal within the left quadratus femoris muscle near its origin along the left ischium. There is also myofascial edema associated with the imaged proximal left hamstring. Tendinosis with high-grade partial-thickness tearing of the right gluteus medius and  minimus tendons. Left gluteal tendons are intact. Moderate left hamstring tendinosis with low-grade interstitial tear.   Other findings   Miscellaneous: No soft tissue edema or fluid collection. No inguinal lymphadenopathy.   IMPRESSION: 1. Mild osteoarthritis of the left hip. No acute osseous abnormality of the left hip or pelvis. 2. Intramuscular edema within the proximal left adductor muscle compartment, nonspecific but may be related to muscle strain. There is some fluid signal within the left quadratus femoris muscle near its origin suggesting a low-grade muscle tear. 3. Moderate left hamstring tendinosis with low-grade interstitial tear. There is also myofascial edema associated with the imaged proximal left hamstring, also likely secondary to strain. 4. Tendinosis with high-grade partial-thickness tearing of the right gluteus medius and minimus tendons.     Electronically Signed   By: Emily Wolfe D.O.   On: 06/14/2022 19:56 I, Emily Wolfe, personally (independently) visualized and performed the interpretation of the images attached in this note.    Assessment and Plan: 67 y.o. female with left hip pain.  Etiology is unclear.  She does have some proximal hamstring tendinitis and partial tear seen on MRI but does not have abductor tendinitis or greater trochanteric bursitis seen on MRI of the left hip.  Her pain is mysterious.  Plan for trial of injection at the hamstring origin.  If this is not helpful consider epidural steroid injection is lumbar radiculopathy could be a factor.  Continue PT.   PDMP not reviewed this encounter. Orders Placed This Encounter  Procedures   [place] LIMITED JOINT SPACE STRUCTURES LOW LEFT(NO LINKED CHARGES)    Order Specific Question:   Reason for Exam (SYMPTOM  OR DIAGNOSIS REQUIRED)    Answer:   hamstring inj    Order Specific Question:   Preferred imaging location?    Answer:   [facility]   No orders of the  defined types were placed in this encounter.    Discussed warning signs or symptoms. Please see discharge instructions. Patient expresses understanding.   The above documentation has been reviewed and is accurate and complete Emily Wolfe, M.D. Total encounter time 30 minutes including face-to-face time with the patient and, reviewing past medical record, and charting on the date of service.   Time-based billing excludes time to perform the injection

## 2022-07-14 ENCOUNTER — Encounter: Payer: Self-pay | Admitting: Internal Medicine

## 2022-07-14 ENCOUNTER — Ambulatory Visit: Payer: Medicare Other | Admitting: Internal Medicine

## 2022-07-14 ENCOUNTER — Other Ambulatory Visit: Payer: Self-pay

## 2022-07-14 VITALS — BP 132/80 | HR 76 | Temp 98.1°F | Resp 16 | Ht 64.0 in | Wt 168.1 lb

## 2022-07-14 DIAGNOSIS — J3089 Other allergic rhinitis: Secondary | ICD-10-CM | POA: Diagnosis not present

## 2022-07-14 DIAGNOSIS — M6281 Muscle weakness (generalized): Secondary | ICD-10-CM | POA: Diagnosis not present

## 2022-07-14 DIAGNOSIS — J309 Allergic rhinitis, unspecified: Secondary | ICD-10-CM

## 2022-07-14 DIAGNOSIS — J302 Other seasonal allergic rhinitis: Secondary | ICD-10-CM

## 2022-07-14 DIAGNOSIS — M25552 Pain in left hip: Secondary | ICD-10-CM | POA: Diagnosis not present

## 2022-07-14 DIAGNOSIS — J3489 Other specified disorders of nose and nasal sinuses: Secondary | ICD-10-CM | POA: Diagnosis not present

## 2022-07-14 MED ORDER — EPINEPHRINE 0.3 MG/0.3ML IJ SOAJ: 0.3000 mg | INTRAMUSCULAR | 1 refills | Status: DC | PRN

## 2022-07-14 MED ORDER — AZELASTINE HCL 0.1 % NA SOLN: 1.0000 | Freq: Two times a day (BID) | NASAL | 5 refills | Status: AC | PRN

## 2022-07-14 NOTE — Progress Notes (Signed)
9  FOLLOW UP Date of Service/Encounter:  07/14/22   Subjective:  Emily Wolfe (DOB: [birth year]) is a 67 y.o. female [facility] in re-evaluation of the following: Allergic rhinitis History obtained from: chart review and patient.  For Review, LV was on 02/18/21  with Dr. Dellis Anes seen for routine follow-up. Therapeutic plans/changes recommended: She was continued on allergy shots and instructed to follow-up with Dr. Suszanne Conners for her left nasal obstruction.  Emily Wolfe is on allergen immunotherapy. She receives two injections. Immunotherapy script #1 contains  ragweed and grasses. She currently receives 0.31mL of the RED vial (1/100). Immunotherapy script #2 contains molds. She currently receives 0.79mL of the RED vial (1/100). She started shots December of 2019 and reached maintenance in March of 2020.   --------------------------------------------------- Today presents for follow-up. She does feel that her allergy injections are keeping her symptoms stable as long as she comes weekly.  She does state that she can feel a difference if she does not come weekly.  She is not having any adverse side effects from allergy injections. She is taking her asteline nasal spray as needed which helps. She did have nasal surgery in December 2023 but still can not breath well out of her left side. However, she did have back surgery last year and has been having to go to physical therapy 4 times per week. Her main focus has been on back pain. She has not followed with ENT since.  She is due for an injection today. She would like to continue her injections.  Allergies as of 07/14/2022   No Known Allergies      Medication List        Accurate as of July 14, 2022  4:14 PM. If you have any questions, ask your nurse or doctor.          azelastine 0.1 % nasal spray Commonly known as: ASTELIN PLACE 2 SPRAYS INTO BOTH NOSTRILS 2 TIMES DAILY. What changed:  See the new instructions.   celecoxib 200 MG capsule Commonly known as: CeleBREX Take 1 capsule (200 mg total) by mouth 2 (two) times daily.   DULoxetine 60 MG capsule Commonly known as: CYMBALTA Take 60 mg by mouth in the morning.   gabapentin 300 MG capsule Commonly known as: NEURONTIN Take 1 capsule (300 mg total) by mouth 3 (three) times daily as needed.   leflunomide 10 MG tablet Commonly known as: ARAVA Take 10 mg by mouth every evening.   Vitamin D3 1.25 MG (50000 UT) Caps Take 1 capsule (1.25 mg total) by mouth once a week.       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   Mild intermittent asthma without complication 11/02/2017   Multiple sclerosis (HCC) 06/29/2017  Neuromuscular disorder (HCC)    Osteoarthritis    Palpitations    Postmenopausal 06/29/2017   Scoliosis of lumbar spine 06/29/2017   Small bowel obstruction (HCC) 06/29/2017   Swallowing difficulty    Umbilical hernia    Urticaria    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  ROS: All others negative except as noted per HPI.   Objective:  BP 132/80   Pulse 76   Temp 98.1 F (36.7 C)   Resp 16   Ht 5\' 4"  (1.626 m)   Wt 168 lb 1.6 oz (76.2 kg)   SpO2 96%   BMI 28.85 kg/m  Body mass index is 28.85 kg/m. Physical Exam: General Appearance:  Alert, cooperative, no distress, appears stated age  Head:  Normocephalic, without obvious abnormality, atraumatic  Eyes:  Conjunctiva clear, EOM's intact  Nose: Nares normal, hypertrophic turbinates, normal mucosa, and no visible anterior polyps  Throat: Lips, tongue normal; teeth and gums normal, normal posterior oropharynx  Neck: Supple, symmetrical  Lungs:   clear to auscultation bilaterally, Respirations unlabored, no coughing  Heart:  regular rate and rhythm and no murmur, Appears well perfused  Extremities: No edema  Skin: Skin color, texture, turgor normal and no rashes or lesions on visualized portions of skin  Neurologic: No gross deficits   Assessment/Plan   1. Seasonal and perennial allergic rhinitis (ragweed, grasses, indoor molds and outdoor molds)-controlled - Continue with allergy shots at once weekly.  - It seems that you are in a good position.  Continue to bring your epinphrine autoinjector to your injections. - Continue with: antihistamine daily, fluticasone one spray per nostril up to twice daily, azelastine nasal spray 1 to 2 sprays in each nostril up to twice daily as needed and nasal saline rinses 1-2 times daily   Allergy injection given in clinic today.  2.  Nasal obstruction: Not controlled - Continue to follow with Dr. Suszanne Conners ENT   3. Return in about 1 year (around 07/14/2023).   Tonny Bollman, MD  [facility]

## 2022-07-14 NOTE — Patient Instructions (Addendum)
1. Seasonal and perennial allergic rhinitis (ragweed, grasses, indoor molds and outdoor molds)-controlled - Continue with allergy shots at once weekly.  - It seems that you are in a good position.  Continue to bring your epinphrine autoinjector to your injections. - Continue with: antihistamine daily, fluticasone one spray per nostril up to twice daily, azelastine nasal spray 1 to 2 sprays in each nostril up to twice daily as needed and nasal saline rinses 1-2 times daily   2.  Nasal obstruction: - Continue to follow with Dr. Suszanne Conners ENT   3. Return in about 1 year (around 07/14/2023).    Please inform us of any Emergency Department visits, hospitalizations, or changes in symptoms. Call us before going to the ED for breathing or allergy symptoms since we might be able to fit you in for a sick visit. Feel free to contact us anytime with any questions, problems, or concerns.  It was a pleasure to see you again today!  Websites that have reliable patient information: 1. American Academy of Asthma, Allergy, and Immunology: www.aaaai.org 2. Food Allergy Research and Education (FARE): foodallergy.org 3. Mothers of Asthmatics: http://www.asthmacommunitynetwork.org 4. American College of Allergy, Asthma, and Immunology: www.acaai.org

## 2022-07-16 DIAGNOSIS — M6281 Muscle weakness (generalized): Secondary | ICD-10-CM | POA: Diagnosis not present

## 2022-07-16 DIAGNOSIS — M25552 Pain in left hip: Secondary | ICD-10-CM | POA: Diagnosis not present

## 2022-07-16 NOTE — Telephone Encounter (Signed)
Forwarding to Dr. Denyse Amass to confirm order.

## 2022-07-16 NOTE — Telephone Encounter (Signed)
Per visit note 07/07/22:  Assessment and Plan: 67 y.o. female with left hip pain.  Etiology is unclear.  She does have some proximal hamstring tendinitis and partial tear seen on MRI but does not have abductor tendinitis or greater trochanteric bursitis seen on MRI of the left hip.  Her pain is mysterious.  Plan for trial of injection at the hamstring origin.  If this is not helpful consider epidural steroid injection is lumbar radiculopathy could be a factor.  Continue PT.

## 2022-07-17 NOTE — Addendum Note (Signed)
Addended by: Rodolph Bong on: 07/17/2022 12:55 PM   Modules accepted: Orders

## 2022-07-21 DIAGNOSIS — M6281 Muscle weakness (generalized): Secondary | ICD-10-CM | POA: Diagnosis not present

## 2022-07-21 DIAGNOSIS — M25552 Pain in left hip: Secondary | ICD-10-CM | POA: Diagnosis not present

## 2022-07-23 DIAGNOSIS — M6281 Muscle weakness (generalized): Secondary | ICD-10-CM | POA: Diagnosis not present

## 2022-07-23 DIAGNOSIS — M25552 Pain in left hip: Secondary | ICD-10-CM | POA: Diagnosis not present

## 2022-07-24 ENCOUNTER — Ambulatory Visit (INDEPENDENT_AMBULATORY_CARE_PROVIDER_SITE_OTHER): Payer: Medicare Other

## 2022-07-24 ENCOUNTER — Telehealth: Payer: Self-pay | Admitting: Internal Medicine

## 2022-07-24 DIAGNOSIS — J309 Allergic rhinitis, unspecified: Secondary | ICD-10-CM | POA: Diagnosis not present

## 2022-07-24 MED ORDER — EPINEPHRINE 0.3 MG/0.3ML IJ SOAJ: 0.3000 mg | INTRAMUSCULAR | 2 refills | Status: DC | PRN

## 2022-07-24 NOTE — Telephone Encounter (Signed)
Patient came in to office today and stated that her pharmacy was unable to fill her prescription for Auvi-Q. Patient is requesting that prescription be sent to [facility] on [address] instead. Patients call back number is [phone].

## 2022-07-24 NOTE — Telephone Encounter (Signed)
Spoke with patient and advised her that Auvi-Q is usually sent to [facility] pharmacy. Patient agreed and refill has been sent to [facility] pharmacy.

## 2022-07-28 DIAGNOSIS — M25552 Pain in left hip: Secondary | ICD-10-CM | POA: Diagnosis not present

## 2022-07-28 DIAGNOSIS — M6281 Muscle weakness (generalized): Secondary | ICD-10-CM | POA: Diagnosis not present

## 2022-07-30 DIAGNOSIS — M6281 Muscle weakness (generalized): Secondary | ICD-10-CM | POA: Diagnosis not present

## 2022-07-30 DIAGNOSIS — M25552 Pain in left hip: Secondary | ICD-10-CM | POA: Diagnosis not present

## 2022-07-31 ENCOUNTER — Ambulatory Visit: Payer: Self-pay | Admitting: *Deleted

## 2022-07-31 DIAGNOSIS — J309 Allergic rhinitis, unspecified: Secondary | ICD-10-CM | POA: Diagnosis not present

## 2022-08-06 ENCOUNTER — Ambulatory Visit (INDEPENDENT_AMBULATORY_CARE_PROVIDER_SITE_OTHER): Payer: Medicare Other

## 2022-08-06 DIAGNOSIS — J309 Allergic rhinitis, unspecified: Secondary | ICD-10-CM

## 2022-08-06 NOTE — Discharge Instructions (Signed)

## 2022-08-07 ENCOUNTER — Ambulatory Visit
Admission: RE | Admit: 2022-08-07 | Discharge: 2022-08-07 | Disposition: A | Discharge status: Home / Self Care | Payer: Medicare Other | Source: Ambulatory Visit | Attending: Family Medicine | Admitting: Family Medicine

## 2022-08-07 DIAGNOSIS — M4727 Other spondylosis with radiculopathy, lumbosacral region: Secondary | ICD-10-CM | POA: Diagnosis not present

## 2022-08-07 DIAGNOSIS — M25552 Pain in left hip: Secondary | ICD-10-CM

## 2022-08-07 DIAGNOSIS — M5416 Radiculopathy, lumbar region: Secondary | ICD-10-CM

## 2022-08-07 MED ORDER — IOPAMIDOL (ISOVUE-M 200) INJECTION 41%
1.0000 mL | Freq: Once | INTRAMUSCULAR | Status: AC
  Administered 2022-08-07: 1 mL via EPIDURAL

## 2022-08-07 MED ORDER — METHYLPREDNISOLONE ACETATE 40 MG/ML INJ SUSP (RADIOLOG
80.0000 mg | Freq: Once | INTRAMUSCULAR | Status: AC
  Administered 2022-08-07: 80 mg via EPIDURAL

## 2022-08-11 ENCOUNTER — Ambulatory Visit (INDEPENDENT_AMBULATORY_CARE_PROVIDER_SITE_OTHER): Payer: Medicare Other

## 2022-08-11 VITALS — Wt 168.0 lb

## 2022-08-11 DIAGNOSIS — M25552 Pain in left hip: Secondary | ICD-10-CM | POA: Diagnosis not present

## 2022-08-11 DIAGNOSIS — Z0000 Encounter for general adult medical examination without abnormal findings: Secondary | ICD-10-CM

## 2022-08-11 DIAGNOSIS — M6281 Muscle weakness (generalized): Secondary | ICD-10-CM | POA: Diagnosis not present

## 2022-08-11 NOTE — Progress Notes (Signed)
Subjective:   Emily Wolfe is a 67 y.o. female who presents for Medicare Annual (Subsequent) preventive examination.  Visit Complete: Virtual  I connected with  [facility] on 08/11/22 by a audio enabled telemedicine application and verified that I am speaking with the correct person using two identifiers.  Patient Location: Home  Provider Location: Office/Clinic  I discussed the limitations of evaluation and management by telemedicine. The patient expressed understanding and agreed to proceed.  Patient Medicare AWV questionnaire was completed by the patient on 08/11/22; I have confirmed that all information answered by patient is correct and no changes since this date.  Vital Signs: Unable to obtain new vitals due to this being a telehealth visit.   Review of Systems     Cardiac Risk Factors include: advanced age (>79men, >48 women);dyslipidemia;hypertension     Objective:    Today's Vitals   08/10/22 1702 08/11/22 1418  Weight:  168 lb (76.2 kg)  PainSc: 6     Body mass index is 28.84 kg/m.     08/11/2022    2:27 PM 06/09/2022    9:52 AM 02/25/2022    8:01 PM 12/01/2021    2:03 PM 08/15/2021    8:05 AM 05/16/2021    1:33 PM 03/30/2021    7:11 AM  Advanced Directives  Does Patient Have a Medical Advance Directive? Yes Yes No Yes Yes Yes No  Type of Estate agent of [place];Living will Healthcare Power of [employer] [place];Living will Living will;Healthcare Power of Attorney   Does patient want to make changes to medical advance directive?  No - Patient declined  No - Patient declined  No - Patient declined   Copy of Healthcare Power of Attorney in Chart? No - copy requested    No - copy requested No - copy requested   Would patient like information on creating a medical advance directive?   No - Patient declined    No - Patient declined    Current Medications (verified) Outpatient Encounter Medications as of 08/11/2022   Medication Sig   azelastine (ASTELIN) 0.1 % nasal spray Place 1 spray into both nostrils 2 (two) times daily as needed for rhinitis. Use in each nostril as directed   celecoxib (CELEBREX) 200 MG capsule Take 1 capsule (200 mg total) by mouth 2 (two) times daily.   Cholecalciferol (VITAMIN D3) 1.25 MG (50000 UT) CAPS Take 1 capsule (1.25 mg total) by mouth once a week.   DULoxetine (CYMBALTA) 60 MG capsule Take 60 mg by mouth in the morning.   EPINEPHrine (AUVI-Q) 0.3 mg/0.3 mL IJ SOAJ injection Inject 0.3 mg into the muscle as needed for anaphylaxis. (Patient taking differently: Inject 0.3 mg into the muscle as needed for anaphylaxis. On hold)   gabapentin (NEURONTIN) 300 MG capsule Take 1 capsule (300 mg total) by mouth 3 (three) times daily as needed.   leflunomide (ARAVA) 10 MG tablet Take 10 mg by mouth every evening.   No facility-administered encounter medications on file as of 08/11/2022.    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   History of ovarian cancer 11/19/2017   History of stomach ulcers    Hyperlipidemia 06/29/2017   Hypertension 06/29/2017   Infection 10/2007   From hysterectomy sutures rupturing   Inguinal hernia    Joint pain    Lactose intolerance    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   Mild intermittent asthma without complication 11/02/2017   Multiple sclerosis (HCC) 06/29/2017   Neuromuscular disorder (HCC)    Osteoarthritis    Palpitations    Postmenopausal 06/29/2017   Scoliosis of lumbar spine 06/29/2017   Small bowel obstruction (HCC) 06/29/2017   Swallowing  difficulty    Umbilical hernia    Urticaria    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   Vitamin D deficiency 06/29/2017   Past Surgical History:  Procedure Laterality Date   ADENOIDECTOMY     CHOLECYSTECTOMY  2009   HERNIA REPAIR     HERNIA REPAIR     6/10, 9/10   HYSTERECTOMY ABDOMINAL WITH SALPINGO-OOPHORECTOMY     HYSTEROTOMY  2008   LAPAROSCOPIC LYSIS OF ADHESIONS     , With small bowel resection and ventral hernia repair with mesh 2019/[place]   MOHS SURGERY     SINOSCOPY     VARICOSE VEIN SURGERY     Family History  Problem Relation Age of Onset   Breast cancer Mother    Cancer Mother    Cervical cancer Maternal Grandmother    Social History   Socioeconomic History   Marital status: Divorced    Spouse name: Not on file   Number of children: Not on file   Years of education: Not on file   Highest education level: Master's degree (e.g., MA, MS, MEng, MEd, MSW, MBA)  Occupational History   Not on file  Tobacco Use   Smoking status: Never   Smokeless tobacco: Never  Vaping Use   Vaping status: Never Used  Substance and Sexual Activity   Alcohol use: Not Currently   Drug use: Not Currently   Sexual activity: Not Currently  Other Topics Concern   Not on file  Social History Narrative   The patient does wear seatbelts. The patient does use sunscreen or protective clothing regularly. She does participate in regular exercise. Her exercise is: elliptical, approximately one time per week. She does not have a secured firearm in the home. She denies a history of intimate partner violence.   Social Determinants of Health   Financial Resource Strain: Low Risk  (08/10/2022)   Overall Financial Resource Strain (CARDIA)    Difficulty of Paying Living Expenses: Not hard at all  Food Insecurity: No Food Insecurity (08/10/2022)   Hunger Vital Sign    Worried About Running Out of Food in the Last Year: Never true    Ran Out of Food in the Last Year: Never true   Transportation Needs: No Transportation Needs (08/10/2022)   PRAPARE - Administrator, Civil Service (Medical): No    Lack of Transportation (Non-Medical): No  Physical Activity: Insufficiently Active (08/10/2022)   Exercise Vital Sign    Days of Exercise per Week: 1 day    Minutes of Exercise per Session: 30 min  Stress: No Stress Concern Present (08/10/2022)   [employer] of Occupational Health - Occupational Stress Questionnaire    Feeling of Stress : Only a little  Social Connections: Socially Isolated (08/10/2022)   Social Connection and Isolation Panel [NHANES]  Frequency of Communication with Friends and Family: Once a week    Frequency of Social Gatherings with Friends and Family: Once a week    Attends Religious Services: Never    Database administrator or Organizations: No    Attends Engineer, structural: Never    Marital Status: Divorced    Tobacco Counseling Counseling given: Not Answered   Clinical Intake:  Pre-visit preparation completed: Yes  Pain : 0-10 Pain Score: 6  Pain Type: Chronic pain Pain Location: Back Pain Descriptors / Indicators: Radiating (down leg) Pain Onset: More than a month ago     BMI - recorded: 28.84 Nutritional Status: BMI 25 -29 Overweight Nutritional Risks: None Diabetes: No  How often do you need to have someone help you when you read instructions, pamphlets, or other written materials from your doctor or pharmacy?: 1 - Never  Interpreter Needed?: No  Information entered by :: Lanier Ensign, LPN   Activities of Daily Living    08/10/2022    5:02 PM 02/26/2022    2:00 PM  In your present state of health, do you have any difficulty performing the following activities:  Hearing? 0 0  Vision? 0 0  Difficulty concentrating or making decisions? 0 0  Walking or climbing stairs? 1 0  Dressing or bathing? 0 0  Doing errands, shopping? 0 0  Preparing Food and eating ? N   Using the Toilet? N   In the  past six months, have you accidently leaked urine? N   Do you have problems with loss of bowel control? N   Managing your Medications? N   Managing your Finances? N   Housekeeping or managing your Housekeeping? N     Patient Care Team: Ardith Dark, MD as PCP - General (Family Medicine) Christianne Borrow, MD as Referring Physician (Obstetrics and Gynecology) Alfonse Spruce, MD as Consulting Physician (Allergy and Immunology) Purvis Sheffield, MD as Referring Physician (Gynecology) Venita Lick, MD as Consulting Physician (Orthopedic Surgery) Zenovia Jarred, MD as Consulting Physician (Neurology)  Indicate any recent Medical Services you may have received from other than [facility] providers in the past year (date may be approximate).     Assessment:   This is a routine wellness examination for Kelly.  Hearing/Vision screen Hearing Screening - Comments:: Pt denies any hearing issues  Vision Screening - Comments:: Pt follows up with [facility] for annual eye exams   Dietary issues and exercise activities discussed:     Goals Addressed             This Visit's Progress    Patient Stated       To be able to be comfortable        Depression Screen    08/11/2022    2:22 PM 05/19/2022   10:47 AM 03/10/2022    8:41 AM 10/30/2021    1:20 PM 08/15/2021    8:02 AM 11/22/2020   11:41 AM 09/25/2019    9:07 AM  PHQ 2/9 Scores  PHQ - 2 Score 0 0 0 0 0 0 2  PHQ- 9 Score       9    Fall Risk    08/10/2022    5:02 PM 05/19/2022   10:47 AM 03/10/2022    8:41 AM 10/30/2021    1:19 PM 08/15/2021    8:06 AM  Fall Risk   Falls in the past year? 1 0 0 1 1  Number falls in past yr:  1 0 0 1 1  Injury with Fall? 0 0 0 0 0  Risk for fall due to : Impaired vision;Impaired mobility No Fall Risks No Fall Risks No Fall Risks Impaired vision  Follow up Falls prevention discussed    Falls prevention discussed    MEDICARE RISK AT HOME:   TIMED UP AND GO:  Was the  test performed?  No    Cognitive Function:        08/11/2022    2:28 PM 08/15/2021    8:09 AM  6CIT Screen  What Year? 0 points 0 points  What month? 0 points 0 points  What time? 0 points 0 points  Count back from 20 0 points 0 points  Months in reverse 0 points 0 points  Repeat phrase 0 points 0 points  Total Score 0 points 0 points    Immunizations Immunization History  Administered Date(s) Administered   DTaP 05/20/1973   Fluad Quad(high Dose 65+) 11/22/2020   IPV 05/20/1973   PFIZER(Purple Top)SARS-COV-2 Vaccination 03/31/2019, 04/25/2019, 10/30/2019, 08/30/2020, 03/26/2021   Pneumococcal-Unspecified 01/07/2021   Rsv, Bivalent, Protein Subunit Rsvpref,pf Verdis Frederickson) 12/05/2021   Td 02/06/2007   Tdap 06/11/2020   Zoster Recombinant(Shingrix) 12/26/2019, 04/24/2020    TDAP status: Up to date  Flu Vaccine status: Due, Education has been provided regarding the importance of this vaccine. Advised may receive this vaccine at local pharmacy or Health Dept. Aware to provide a copy of the vaccination record if obtained from local pharmacy or Health Dept. Verbalized acceptance and understanding.  Pneumococcal vaccine status: Due, Education has been provided regarding the importance of this vaccine. Advised may receive this vaccine at local pharmacy or Health Dept. Aware to provide a copy of the vaccination record if obtained from local pharmacy or Health Dept. Verbalized acceptance and understanding.  Covid-19 vaccine status: Completed vaccines  Qualifies for Shingles Vaccine? Yes   Zostavax completed Yes   Shingrix Completed?: Yes  Screening Tests Health Maintenance  Topic Date Due   Pneumonia Vaccine 79+ Years old (1 of 1 - PCV) 08/16/2020   COVID-19 Vaccine (6 - 2023-24 season) 09/05/2021   INFLUENZA VACCINE  08/06/2022   MAMMOGRAM  08/31/2022   Medicare Annual Wellness (AWV)  08/11/2023   Colonoscopy  12/24/2027   DTaP/Tdap/Td (4 - Td or Tdap) 06/12/2030   DEXA SCAN   Completed   Hepatitis C Screening  Completed   Zoster Vaccines- Shingrix  Completed   HPV VACCINES  Aged Out    Health Maintenance  Health Maintenance Due  Topic Date Due   Pneumonia Vaccine 53+ Years old (1 of 1 - PCV) 08/16/2020   COVID-19 Vaccine (6 - 2023-24 season) 09/05/2021   INFLUENZA VACCINE  08/06/2022    Colorectal cancer screening: Type of screening: Colonoscopy. Completed 12/23/17. Repeat every 10 years  Mammogram status: Completed 08/27/24. Repeat every year  Bone Density status: Completed 05/06/21. Results reflect: Bone density results: NORMAL. Repeat every 2 years.   Additional Screening:  Hepatitis C Screening:  Completed 12//19/22  Vision Screening: Recommended annual ophthalmology exams for early detection of glaucoma and other disorders of the eye. Is the patient up to date with their annual eye exam?  No  Who is the provider or what is the name of the office in which the patient attends annual eye exams? [facility]  If pt is not established with a provider, would they like to be referred to a provider to establish care? No .   Dental Screening: Recommended annual  dental exams for proper oral hygiene   Community Resource Referral / Chronic Care Management: CRR required this visit?  No   CCM required this visit?  No     Plan:     I have personally reviewed and noted the following in the patient's chart:   Medical and social history Use of alcohol, tobacco or illicit drugs  Current medications and supplements including opioid prescriptions. Patient is not currently taking opioid prescriptions. Functional ability and status Nutritional status Physical activity Advanced directives List of other physicians Hospitalizations, surgeries, and ER visits in previous 12 months Vitals Screenings to include cognitive, depression, and falls Referrals and appointments  In addition, I have reviewed and discussed with patient certain preventive  protocols, quality metrics, and best practice recommendations. A written personalized care plan for preventive services as well as general preventive health recommendations were provided to patient.     Marzella Schlein, LPN   01/10/1094   After Visit Summary: (MyChart) Due to this being a telephonic visit, the after visit summary with patients personalized plan was offered to patient via MyChart   Nurse Notes: none

## 2022-08-11 NOTE — Patient Instructions (Signed)
Emily Wolfe , Thank you for taking time to come for your Medicare Wellness Visit. I appreciate your ongoing commitment to your health goals. Please review the following plan we discussed and let me know if I can assist you in the future.   Referrals/Orders/Follow-Ups/Clinician Recommendations: get more comfortable to be able to do things   This is a list of the screening recommended for you and due dates:  Health Maintenance  Topic Date Due   Pneumonia Vaccine (1 of 1 - PCV) 08/16/2020   COVID-19 Vaccine (6 - 2023-24 season) 09/05/2021   Flu Shot  08/06/2022   Medicare Annual Wellness Visit  08/16/2022   Mammogram  08/31/2022   Colon Cancer Screening  12/24/2027   DTaP/Tdap/Td vaccine (4 - Td or Tdap) 06/12/2030   DEXA scan (bone density measurement)  Completed   Hepatitis C Screening  Completed   Zoster (Shingles) Vaccine  Completed   HPV Vaccine  Aged Out    Advanced directives: (Copy Requested) Please bring a copy of your health care power of attorney and living will to the office to be added to your chart at your convenience.  Next Medicare Annual Wellness Visit scheduled for next year: Yes  Preventive Care 67 Years and Older, Female Preventive care refers to lifestyle choices and visits with your health care provider that can promote health and wellness. What does preventive care include? A yearly physical exam. This is also called an annual well check. Dental exams once or twice a year. Routine eye exams. Ask your health care provider how often you should have your eyes checked. Personal lifestyle choices, including: Daily care of your teeth and gums. Regular physical activity. Eating a healthy diet. Avoiding tobacco and drug use. Limiting alcohol use. Practicing safe sex. Taking low-dose aspirin every day. Taking vitamin and mineral supplements as recommended by your health care provider. What happens during an annual well check? The services and screenings done by your  health care provider during your annual well check will depend on your age, overall health, lifestyle risk factors, and family history of disease. Counseling  Your health care provider may ask you questions about your: Alcohol use. Tobacco use. Drug use. Emotional well-being. Home and relationship well-being. Sexual activity. Eating habits. History of falls. Memory and ability to understand (cognition). Work and work Astronomer. Reproductive health. Screening  You may have the following tests or measurements: Height, weight, and BMI. Blood pressure. Lipid and cholesterol levels. These may be checked every 5 years, or more frequently if you are over 23 years old. Skin check. Lung cancer screening. You may have this screening every year starting at age 67 if you have a 30-pack-year history of smoking and currently smoke or have quit within the past 15 years. Fecal occult blood test (FOBT) of the stool. You may have this test every year starting at age 67. Flexible sigmoidoscopy or colonoscopy. You may have a sigmoidoscopy every 5 years or a colonoscopy every 10 years starting at age 67. Hepatitis C blood test. Hepatitis B blood test. Sexually transmitted disease (STD) testing. Diabetes screening. This is done by checking your blood sugar (glucose) after you have not eaten for a while (fasting). You may have this done every 1-3 years. Bone density scan. This is done to screen for osteoporosis. You may have this done starting at age 63. Mammogram. This may be done every 1-2 years. Talk to your health care provider about how often you should have regular mammograms. Talk with your health  care provider about your test results, treatment options, and if necessary, the need for more tests. Vaccines  Your health care provider may recommend certain vaccines, such as: Influenza vaccine. This is recommended every year. Tetanus, diphtheria, and acellular pertussis (Tdap, Td) vaccine. You may  need a Td booster every 10 years. Zoster vaccine. You may need this after age 36. Pneumococcal 13-valent conjugate (PCV13) vaccine. One dose is recommended after age 47. Pneumococcal polysaccharide (PPSV23) vaccine. One dose is recommended after age 16. Talk to your health care provider about which screenings and vaccines you need and how often you need them. This information is not intended to replace advice given to you by your health care provider. Make sure you discuss any questions you have with your health care provider. Document Released: 01/18/2015 Document Revised: 09/11/2015 Document Reviewed: 10/23/2014 Elsevier Interactive Patient Education  2017 [employer].  Fall Prevention in the Home Falls can cause injuries. They can happen to people of all ages. There are many things you can do to make your home safe and to help prevent falls. What can I do on the outside of my home? Regularly fix the edges of walkways and driveways and fix any cracks. Remove anything that might make you trip as you walk through a door, such as a raised step or threshold. Trim any bushes or trees on the path to your home. Use bright outdoor lighting. Clear any walking paths of anything that might make someone trip, such as rocks or tools. Regularly check to see if handrails are loose or broken. Make sure that both sides of any steps have handrails. Any raised decks and porches should have guardrails on the edges. Have any leaves, snow, or ice cleared regularly. Use sand or salt on walking paths during winter. Clean up any spills in your garage right away. This includes oil or grease spills. What can I do in the bathroom? Use night lights. Install grab bars by the toilet and in the tub and shower. Do not use towel bars as grab bars. Use non-skid mats or decals in the tub or shower. If you need to sit down in the shower, use a plastic, non-slip stool. Keep the floor dry. Clean up any water that spills on  the floor as soon as it happens. Remove soap buildup in the tub or shower regularly. Attach bath mats securely with double-sided non-slip rug tape. Do not have throw rugs and other things on the floor that can make you trip. What can I do in the bedroom? Use night lights. Make sure that you have a light by your bed that is easy to reach. Do not use any sheets or blankets that are too big for your bed. They should not hang down onto the floor. Have a firm chair that has side arms. You can use this for support while you get dressed. Do not have throw rugs and other things on the floor that can make you trip. What can I do in the kitchen? Clean up any spills right away. Avoid walking on wet floors. Keep items that you use a lot in easy-to-reach places. If you need to reach something above you, use a strong step stool that has a grab bar. Keep electrical cords out of the way. Do not use floor polish or wax that makes floors slippery. If you must use wax, use non-skid floor wax. Do not have throw rugs and other things on the floor that can make you trip. What can  I do with my stairs? Do not leave any items on the stairs. Make sure that there are handrails on both sides of the stairs and use them. Fix handrails that are broken or loose. Make sure that handrails are as long as the stairways. Check any carpeting to make sure that it is firmly attached to the stairs. Fix any carpet that is loose or worn. Avoid having throw rugs at the top or bottom of the stairs. If you do have throw rugs, attach them to the floor with carpet tape. Make sure that you have a light switch at the top of the stairs and the bottom of the stairs. If you do not have them, ask someone to add them for you. What else can I do to help prevent falls? Wear shoes that: Do not have high heels. Have rubber bottoms. Are comfortable and fit you well. Are closed at the toe. Do not wear sandals. If you use a stepladder: Make sure  that it is fully opened. Do not climb a closed stepladder. Make sure that both sides of the stepladder are locked into place. Ask someone to hold it for you, if possible. Clearly mark and make sure that you can see: Any grab bars or handrails. First and last steps. Where the edge of each step is. Use tools that help you move around (mobility aids) if they are needed. These include: Canes. Walkers. Scooters. Crutches. Turn on the lights when you go into a dark area. Replace any light bulbs as soon as they burn out. Set up your furniture so you have a clear path. Avoid moving your furniture around. If any of your floors are uneven, fix them. If there are any pets around you, be aware of where they are. Review your medicines with your doctor. Some medicines can make you feel dizzy. This can increase your chance of falling. Ask your doctor what other things that you can do to help prevent falls. This information is not intended to replace advice given to you by your health care provider. Make sure you discuss any questions you have with your health care provider. Document Released: 10/18/2008 Document Revised: 05/30/2015 Document Reviewed: 01/26/2014 Elsevier Interactive Patient Education  2017 [employer].

## 2022-08-12 NOTE — Telephone Encounter (Signed)
Called and spoke with patient. D/t mechanism of injury and continued pain with WB, scheduled pt for eval on 08/13/22 at 10:45 AM.

## 2022-08-13 ENCOUNTER — Ambulatory Visit (INDEPENDENT_AMBULATORY_CARE_PROVIDER_SITE_OTHER): Payer: Medicare Other

## 2022-08-13 ENCOUNTER — Encounter: Payer: Self-pay | Admitting: Family Medicine

## 2022-08-13 ENCOUNTER — Ambulatory Visit: Payer: Medicare Other | Admitting: Family Medicine

## 2022-08-13 VITALS — BP 160/84 | HR 65 | Ht 64.0 in | Wt 165.0 lb

## 2022-08-13 DIAGNOSIS — M5416 Radiculopathy, lumbar region: Secondary | ICD-10-CM

## 2022-08-13 DIAGNOSIS — S76302A Unspecified injury of muscle, fascia and tendon of the posterior muscle group at thigh level, left thigh, initial encounter: Secondary | ICD-10-CM

## 2022-08-13 DIAGNOSIS — M25552 Pain in left hip: Secondary | ICD-10-CM

## 2022-08-13 DIAGNOSIS — M47816 Spondylosis without myelopathy or radiculopathy, lumbar region: Secondary | ICD-10-CM | POA: Diagnosis not present

## 2022-08-13 DIAGNOSIS — M79605 Pain in left leg: Secondary | ICD-10-CM | POA: Diagnosis not present

## 2022-08-13 DIAGNOSIS — M6281 Muscle weakness (generalized): Secondary | ICD-10-CM | POA: Diagnosis not present

## 2022-08-13 NOTE — Progress Notes (Signed)
I, Stevenson Clinch, CMA acting as a scribe for Clementeen Graham, MD.  Emily Wolfe is a 67 y.o. female who presents to [facility] at [facility] today for cont'd L leg pain. Pt was last seen by Dr. Denyse Amass on 07/07/22 and was given a L ischial tuberosity bursa steroid injection and was advised to cont PT at [facility]. She had a lumbar ESI on 08/07/22.  Today, pt reports worsening pain in the left leg since this past Sunday. Pt notes that she had just reached about a 1/2 mile walking her 2 dogs when her smaller dog suddenly turned in front her her. She stumbled to avoid stepping on the smaller dog and feels that all of the impact of her weight was placed on the left leg. She has significant pain with WB and ambulation. Pain does not extend beyond the knee. Walking with abnormal gait today. Feels that pain is more in the upper leg than the hip.   Dx testing: 05/30/22 L hip MRI             05/21/22 R & L hand XR and labs  Pertinent review of systems: No fevers or chills  Relevant historical information: Hypertension.  MS.   Exam:  BP (!) 160/84   Pulse 65   Ht 5\' 4"  (1.626 m)   Wt 165 lb (74.8 kg)   SpO2 100%   BMI 28.32 kg/m  General: Well Developed, well nourished, and in no acute distress.   MSK: L-spine nontender palpation midline.  Decreased lumbar motion. Left hip normal-appearing Tender palpation posterior buttocks into the thigh. Normal motion. Pain with resisted hip extension and knee flexion.    Lab and Radiology Results  X-ray images left femur obtained today personally and independently interpreted No acute fractures.  Mild left hip DJD. Await formal radiology review    Assessment and Plan: 67 y.o. female with left posterior thigh pain.  This is an acute exacerbation of a chronic problem.  Previous left hip MRI showed a hamstring tendinitis of insertion and a steroid injection was performed on July 2.  She was doing okay and till she had an injury a  few days ago.  Current injury most consistent with hamstring tear or strain.  Plan for continued PT and home exercise program working on hamstring.  Additionally I think a fair amount of component of her pain is due to lumbar radiculopathy.  She has an old MRI and had an epidural steroid injection recently with little benefit.  It is possible that were just injected the wrong target.  Plan for updated MRI lumbar spine.   PDMP not reviewed this encounter. Orders Placed This Encounter  Procedures   DG FEMUR MIN 2 VIEWS LEFT    Standing Status:   Future    Number of Occurrences:   1    Standing Expiration Date:   08/13/2023    Order Specific Question:   Reason for Exam (SYMPTOM  OR DIAGNOSIS REQUIRED)    Answer:   eval left hip and thigh pain    Order Specific Question:   Preferred imaging location?    Answer:   [facility]   MR Lumbar Spine Wo Contrast    Standing Status:   Future    Standing Expiration Date:   08/13/2023    Order Specific Question:   What is the patient's sedation requirement?    Answer:   No Sedation    Order Specific Question:  Does the patient have a pacemaker or implanted devices?    Answer:   No    Order Specific Question:   Preferred imaging location?    Answer:   [facility] (table limit-350lbs)   No orders of the defined types were placed in this encounter.    Discussed warning signs or symptoms. Please see discharge instructions. Patient expresses understanding.   The above documentation has been reviewed and is accurate and complete Clementeen Graham, M.D.

## 2022-08-13 NOTE — Patient Instructions (Signed)
Thank you for coming in today.   Please get an Xray today before you leave   You should hear from MRI scheduling within 1 week. If you do not hear please let me know.    Continue PT.   The [place] L Protocol for Hamstring Strains  Leeroy Cha PT

## 2022-08-18 DIAGNOSIS — M6281 Muscle weakness (generalized): Secondary | ICD-10-CM | POA: Diagnosis not present

## 2022-08-18 DIAGNOSIS — M25552 Pain in left hip: Secondary | ICD-10-CM | POA: Diagnosis not present

## 2022-08-20 DIAGNOSIS — M6281 Muscle weakness (generalized): Secondary | ICD-10-CM | POA: Diagnosis not present

## 2022-08-20 DIAGNOSIS — M25552 Pain in left hip: Secondary | ICD-10-CM | POA: Diagnosis not present

## 2022-08-21 ENCOUNTER — Ambulatory Visit (INDEPENDENT_AMBULATORY_CARE_PROVIDER_SITE_OTHER): Payer: Medicare Other

## 2022-08-21 DIAGNOSIS — J309 Allergic rhinitis, unspecified: Secondary | ICD-10-CM

## 2022-08-22 ENCOUNTER — Ambulatory Visit (INDEPENDENT_AMBULATORY_CARE_PROVIDER_SITE_OTHER): Payer: Medicare Other

## 2022-08-22 DIAGNOSIS — M4807 Spinal stenosis, lumbosacral region: Secondary | ICD-10-CM | POA: Diagnosis not present

## 2022-08-22 DIAGNOSIS — M48061 Spinal stenosis, lumbar region without neurogenic claudication: Secondary | ICD-10-CM | POA: Diagnosis not present

## 2022-08-22 DIAGNOSIS — M5137 Other intervertebral disc degeneration, lumbosacral region: Secondary | ICD-10-CM | POA: Diagnosis not present

## 2022-08-22 DIAGNOSIS — M5416 Radiculopathy, lumbar region: Secondary | ICD-10-CM

## 2022-08-22 DIAGNOSIS — M5136 Other intervertebral disc degeneration, lumbar region: Secondary | ICD-10-CM | POA: Diagnosis not present

## 2022-08-24 NOTE — Progress Notes (Signed)
Left femur x-ray looks okay to radiology.

## 2022-08-25 DIAGNOSIS — M6281 Muscle weakness (generalized): Secondary | ICD-10-CM | POA: Diagnosis not present

## 2022-08-25 DIAGNOSIS — M25552 Pain in left hip: Secondary | ICD-10-CM | POA: Diagnosis not present

## 2022-08-27 DIAGNOSIS — M25552 Pain in left hip: Secondary | ICD-10-CM | POA: Diagnosis not present

## 2022-08-27 DIAGNOSIS — M6281 Muscle weakness (generalized): Secondary | ICD-10-CM | POA: Diagnosis not present

## 2022-09-04 ENCOUNTER — Ambulatory Visit (INDEPENDENT_AMBULATORY_CARE_PROVIDER_SITE_OTHER): Payer: Medicare Other

## 2022-09-04 DIAGNOSIS — J309 Allergic rhinitis, unspecified: Secondary | ICD-10-CM | POA: Diagnosis not present

## 2022-09-07 ENCOUNTER — Encounter: Payer: Self-pay | Admitting: Family Medicine

## 2022-09-07 DIAGNOSIS — M5416 Radiculopathy, lumbar region: Secondary | ICD-10-CM

## 2022-09-08 NOTE — Progress Notes (Signed)
Lumbar spine MRI shows spinal stenosis at L4-5 potentially causing leg pain and severe left-sided potential nerve pinched at L5-S1 which could cause leg pain as well.  This was the level that was injected with little benefit in early August.  We could try another injection if you would like.  Please let me know.

## 2022-09-10 NOTE — Discharge Instructions (Signed)

## 2022-09-10 NOTE — Telephone Encounter (Signed)
Dr. Denyse Amass, Who would you recommend for pt to see for surgical consult for the spine?

## 2022-09-11 ENCOUNTER — Ambulatory Visit
Admission: RE | Admit: 2022-09-11 | Discharge: 2022-09-11 | Disposition: A | Discharge status: Home / Self Care | Payer: Medicare Other | Source: Ambulatory Visit | Attending: Family Medicine | Admitting: Family Medicine

## 2022-09-11 DIAGNOSIS — M5416 Radiculopathy, lumbar region: Secondary | ICD-10-CM

## 2022-09-11 DIAGNOSIS — M4727 Other spondylosis with radiculopathy, lumbosacral region: Secondary | ICD-10-CM | POA: Diagnosis not present

## 2022-09-11 DIAGNOSIS — Z981 Arthrodesis status: Secondary | ICD-10-CM | POA: Diagnosis not present

## 2022-09-11 MED ORDER — IOPAMIDOL (ISOVUE-M 200) INJECTION 41%
1.0000 mL | Freq: Once | INTRAMUSCULAR | Status: AC
  Administered 2022-09-11: 1 mL via EPIDURAL

## 2022-09-11 MED ORDER — METHYLPREDNISOLONE ACETATE 40 MG/ML INJ SUSP (RADIOLOG
80.0000 mg | Freq: Once | INTRAMUSCULAR | Status: AC
  Administered 2022-09-11: 80 mg via EPIDURAL

## 2022-09-11 NOTE — Telephone Encounter (Signed)
Seeing Dr. Yetta Barre the spine surgeon is probably a good idea.

## 2022-09-18 ENCOUNTER — Ambulatory Visit (INDEPENDENT_AMBULATORY_CARE_PROVIDER_SITE_OTHER): Payer: Medicare Other

## 2022-09-18 DIAGNOSIS — J309 Allergic rhinitis, unspecified: Secondary | ICD-10-CM | POA: Diagnosis not present

## 2022-09-22 DIAGNOSIS — J301 Allergic rhinitis due to pollen: Secondary | ICD-10-CM | POA: Diagnosis not present

## 2022-09-22 NOTE — Progress Notes (Signed)
VIALS EXP 09-22-23

## 2022-09-23 DIAGNOSIS — J302 Other seasonal allergic rhinitis: Secondary | ICD-10-CM | POA: Diagnosis not present

## 2022-10-06 DIAGNOSIS — M4316 Spondylolisthesis, lumbar region: Secondary | ICD-10-CM | POA: Diagnosis not present

## 2022-10-06 DIAGNOSIS — M5416 Radiculopathy, lumbar region: Secondary | ICD-10-CM | POA: Diagnosis not present

## 2022-10-16 DIAGNOSIS — M5416 Radiculopathy, lumbar region: Secondary | ICD-10-CM | POA: Diagnosis not present

## 2022-10-22 ENCOUNTER — Ambulatory Visit (INDEPENDENT_AMBULATORY_CARE_PROVIDER_SITE_OTHER): Payer: Medicare Other | Admitting: *Deleted

## 2022-10-22 DIAGNOSIS — J309 Allergic rhinitis, unspecified: Secondary | ICD-10-CM | POA: Diagnosis not present

## 2022-10-27 ENCOUNTER — Other Ambulatory Visit: Payer: Self-pay | Admitting: Family Medicine

## 2022-11-05 ENCOUNTER — Ambulatory Visit (INDEPENDENT_AMBULATORY_CARE_PROVIDER_SITE_OTHER): Payer: Medicare Other

## 2022-11-05 DIAGNOSIS — J309 Allergic rhinitis, unspecified: Secondary | ICD-10-CM | POA: Diagnosis not present

## 2022-11-05 MED ORDER — EPINEPHRINE 0.3 MG/0.3ML IJ SOAJ: 0.3000 mg | INTRAMUSCULAR | 1 refills | Status: AC | PRN

## 2022-11-10 DIAGNOSIS — Z1231 Encounter for screening mammogram for malignant neoplasm of breast: Secondary | ICD-10-CM | POA: Diagnosis not present

## 2022-11-10 DIAGNOSIS — Z803 Family history of malignant neoplasm of breast: Secondary | ICD-10-CM | POA: Diagnosis not present

## 2022-11-12 ENCOUNTER — Ambulatory Visit (INDEPENDENT_AMBULATORY_CARE_PROVIDER_SITE_OTHER): Payer: Medicare Other

## 2022-11-12 DIAGNOSIS — J309 Allergic rhinitis, unspecified: Secondary | ICD-10-CM | POA: Diagnosis not present

## 2022-11-24 DIAGNOSIS — M5416 Radiculopathy, lumbar region: Secondary | ICD-10-CM | POA: Diagnosis not present

## 2022-12-01 ENCOUNTER — Ambulatory Visit (INDEPENDENT_AMBULATORY_CARE_PROVIDER_SITE_OTHER): Payer: Medicare Other

## 2022-12-01 DIAGNOSIS — J309 Allergic rhinitis, unspecified: Secondary | ICD-10-CM

## 2022-12-11 ENCOUNTER — Telehealth: Payer: Self-pay | Admitting: Family Medicine

## 2022-12-11 DIAGNOSIS — G35 Multiple sclerosis: Secondary | ICD-10-CM | POA: Diagnosis not present

## 2022-12-11 LAB — LAB REPORT - SCANNED

## 2022-12-11 NOTE — Telephone Encounter (Signed)
See note, no information received

## 2022-12-11 NOTE — Telephone Encounter (Signed)
I have not seen any records.  [name]. [name], MD 12/11/2022 1:32 PM

## 2022-12-11 NOTE — Telephone Encounter (Signed)
Patient states she has been feeling fatigue and lightheaded for a while. States she has seen neurology about this and they completed a CBC panel. Pt states she is having them fax over the results so that PCP can review. Pt requests that someone inform her once these are received so she can schedule OV with PCP then.

## 2022-12-14 ENCOUNTER — Telehealth: Payer: Self-pay | Admitting: Family Medicine

## 2022-12-14 NOTE — Telephone Encounter (Signed)
Final outcome: See PCP within 24 hours. I went and spoke with PCP since he will be out of office and there are no openings with anyone at PCP office within 24 hours. I informed him that patient is already scheduled to see him on 12/16/22 and he agreed. States that if sx worsen, then go to ED. I informed pt of this and she verbalized understanding.     Patient Name First: Emily Last: Wolfe Gender: Female DOB: [birth year] Age: 67 Y 3 M 27 D Return Phone Number: [phone] (Primary) Address: City/ State/ Zip: [place]  [address] Client [facility] - Administrator, [facility] at [facility] Provider Jacquiline Doe- MD Contact Type Call Who Is Calling Patient / Member / Family / Caregiver Call Type Triage / Clinical Relationship To Patient Self Return Phone Number [phone] (Primary) Chief Complaint Dizziness Reason for Call Symptomatic / Request for Health Information Initial Comment She has dizziness Translation No Nurse Assessment Nurse: D'Heur Ezzard Standing, RN, Adrienne Date/Time (Eastern Time): 12/14/2022 2:50:12 PM Confirm and document reason for call. If symptomatic, describe symptoms. ---Caller states she's been having dizziness for about a week (since 11/30). It is both light-headedness and spinning. She states she always feels light-headed but if she moves quickly, it is more spinning. She has chronic back pain and sees a neurosurgeon. She states she feels "weird and fatigued". She states she drinks at least 6 glasses of fluids daily. Does the patient have any new or worsening symptoms? ---Yes Will a triage be completed? ---Yes Related visit to physician within the last 2 weeks? ---No Does the PT have any chronic conditions? (i.e. diabetes, asthma, this includes High risk factors for pregnancy, etc.) ---Yes List chronic conditions. ---chronic back pain; MS, Hx of cancer is 2009, GI issues Is this a  behavioral health or substance abuse call? ---No Guidelines Guideline Title Affirmed Question Affirmed Notes Nurse Date/Time Lamount Cohen Time) Dizziness - Lightheadedness [1] MODERATE dizziness (e.g., interferes with D'Heur Ezzard Standing, RN, Adrienne 12/14/2022 2:56:29 PM Guidelines Guideline Title Affirmed Question Affirmed Notes Nurse Date/Time Lamount Cohen Time) normal activities) AND [2] has NOT been evaluated by doctor (or NP/PA) for this (Exception: Dizziness caused by heat exposure, sudden standing, or poor fluid intake.) Disp. Time Lamount Cohen Time) Disposition Final User 12/14/2022 3:05:10 PM See PCP within 24 Hours Yes D'Heur Ezzard Standing, RN, Hansel Starling Final Disposition 12/14/2022 3:05:10 PM See PCP within 24 Hours Yes D'Heur Ezzard Standing, RN, Hansel Starling Caller Disagree/Comply Comply Caller Understands Yes PreDisposition Call Doctor Care Advice Given Per Guideline SEE PCP WITHIN 24 HOURS: * IF OFFICE WILL BE OPEN: You need to be examined within the next 24 hours. Call your doctor (or NP/PA) when the office opens and make an appointment. DRINK FLUIDS: * Drink several glasses of fruit juice, other clear fluids or water. LIE DOWN AND REST: * Lie down with feet elevated for 1 hour. * This will improve blood flow through the body and to the brain. CALL BACK IF: * You pass out (faint) or are too weak to stand * You become worse CARE ADVICE given per Dizziness - Lightheadedness (Adult) guideline.  Comments User: Hansel Starling, D'Heur Ezzard Standing, RN Date/Time Lamount Cohen Time): 12/14/2022 3:04:22 PM Caller states she has an appointment for day after tomorrow with call for sooner if someone cancels. Referrals REFERRED TO PCP OFFICE

## 2022-12-14 NOTE — Telephone Encounter (Signed)
FYI: This call has been transferred to triage nurse: the Triage Nurse. Once the result note has been entered staff can address the message at that time.  Patient called in with the following symptoms:  Red Word:dizziness , fatigue nausea    Please advise at Mobile [phone] (mobile)  Message is routed to Provider Pool.

## 2022-12-15 NOTE — Telephone Encounter (Signed)
Patient was triaged and coming in office to see PCP 12/16/22

## 2022-12-15 NOTE — Telephone Encounter (Signed)
Please see pt triage note; pt scheduled for 12/16/22 with PCP

## 2022-12-16 ENCOUNTER — Encounter: Payer: Self-pay | Admitting: Family Medicine

## 2022-12-16 ENCOUNTER — Ambulatory Visit (INDEPENDENT_AMBULATORY_CARE_PROVIDER_SITE_OTHER): Payer: Medicare Other | Admitting: Family Medicine

## 2022-12-16 VITALS — BP 145/83 | HR 72 | Temp 98.0°F | Ht 64.0 in | Wt 160.6 lb

## 2022-12-16 DIAGNOSIS — R42 Dizziness and giddiness: Secondary | ICD-10-CM | POA: Diagnosis not present

## 2022-12-16 DIAGNOSIS — G8929 Other chronic pain: Secondary | ICD-10-CM

## 2022-12-16 DIAGNOSIS — G35 Multiple sclerosis: Secondary | ICD-10-CM | POA: Diagnosis not present

## 2022-12-16 DIAGNOSIS — I10 Essential (primary) hypertension: Secondary | ICD-10-CM | POA: Diagnosis not present

## 2022-12-16 DIAGNOSIS — M545 Low back pain, unspecified: Secondary | ICD-10-CM | POA: Diagnosis not present

## 2022-12-16 DIAGNOSIS — R5383 Other fatigue: Secondary | ICD-10-CM | POA: Diagnosis not present

## 2022-12-16 LAB — COMPREHENSIVE METABOLIC PANEL
ALT: 9 U/L (ref 0–35)
AST: 17 U/L (ref 0–37)
Albumin: 4.2 g/dL (ref 3.5–5.2)
Alkaline Phosphatase: 91 U/L (ref 39–117)
BUN: 28 mg/dL — ABNORMAL HIGH (ref 6–23)
CO2: 31 meq/L (ref 19–32)
Calcium: 9.4 mg/dL (ref 8.4–10.5)
Chloride: 104 meq/L (ref 96–112)
Creatinine, Ser: 0.79 mg/dL (ref 0.40–1.20)
GFR: 77.45 mL/min (ref >60.00)
Glucose, Bld: 97 mg/dL (ref 70–99)
Potassium: 5.1 meq/L (ref 3.5–5.1)
Sodium: 140 meq/L (ref 135–145)
Total Bilirubin: 0.4 mg/dL (ref 0.2–1.2)
Total Protein: 6.4 g/dL (ref 6.0–8.3)

## 2022-12-16 LAB — URINALYSIS, ROUTINE W REFLEX MICROSCOPIC
Bilirubin Urine: NEGATIVE
Hgb urine dipstick: NEGATIVE
Ketones, ur: NEGATIVE
Nitrite: NEGATIVE
RBC / HPF: NONE SEEN (ref 0–?)
Specific Gravity, Urine: 1.01 (ref 1.000–1.030)
Total Protein, Urine: NEGATIVE
Urine Glucose: NEGATIVE
Urobilinogen, UA: 0.2 (ref 0.0–1.0)
pH: 7 (ref 5.0–8.0)

## 2022-12-16 LAB — CBC
HCT: 42.3 % (ref 36.0–46.0)
Hemoglobin: 13.8 g/dL (ref 12.0–15.0)
MCHC: 32.7 g/dL (ref 30.0–36.0)
MCV: 90.9 fL (ref 78.0–100.0)
Platelets: 172 10*3/uL (ref 150.0–400.0)
RBC: 4.65 Mil/uL (ref 3.87–5.11)
RDW: 13.5 % (ref 11.5–15.5)
WBC: 5 10*3/uL (ref 4.0–10.5)

## 2022-12-16 LAB — HEMOGLOBIN A1C: Hgb A1c MFr Bld: 5.5 % (ref 4.6–6.5)

## 2022-12-16 LAB — IBC + FERRITIN
Ferritin: 44.5 ng/mL (ref 10.0–291.0)
Iron: 99 ug/dL (ref 42–145)
Saturation Ratios: 30 % (ref 20.0–50.0)
TIBC: 330.4 ug/dL (ref 250.0–450.0)
Transferrin: 236 mg/dL (ref 212.0–360.0)

## 2022-12-16 LAB — VITAMIN B12: Vitamin B-12: 1270 pg/mL — ABNORMAL HIGH (ref 211–911)

## 2022-12-16 LAB — VITAMIN D 25 HYDROXY (VIT D DEFICIENCY, FRACTURES): VITD: 76.69 ng/mL (ref 30.00–100.00)

## 2022-12-16 LAB — TSH: TSH: 1.56 u[IU]/mL (ref 0.35–5.50)

## 2022-12-16 NOTE — Patient Instructions (Signed)
It was very nice to see you today!  I think you probably have mild vertigo. We will check labs to make sure nothing else is going on.  We will get you set up to see the vestibular rehab specialists.   Please make sure you are getting plenty of fluids   Return if symptoms worsen or fail to improve.   Take care, Dr [name]  PLEASE NOTE:  If you had any lab tests, please let us know if you have not heard back within a few days. You may see your results on mychart before we have a chance to review them but we will give you a call once they are reviewed by [place].   If we ordered any referrals today, please let us know if you have not heard from their office within the next week.   If you had any urgent prescriptions sent in today, please check with the pharmacy within an hour of our visit to make sure the prescription was transmitted appropriately.   Please try these tips to maintain a healthy lifestyle:  Eat at least 3 REAL meals and 1-2 snacks per day.  Aim for no more than 5 hours between eating.  If you eat breakfast, please do so within one hour of getting up.   Each meal should contain half fruits/vegetables, one quarter protein, and one quarter carbs (no bigger than a computer mouse)  Cut down on sweet beverages. This includes juice, soda, and sweet tea.   Drink at least 1 glass of water with each meal and aim for at least 8 glasses per day  Exercise at least 150 minutes every week.

## 2022-12-16 NOTE — Assessment & Plan Note (Addendum)
Mildly elevated today.  At goal per JNC 8.  Previously well-controlled.  She will continue to monitor at home and let us know if persistently elevated.

## 2022-12-16 NOTE — Assessment & Plan Note (Signed)
Following with allergy for this.  Overall symptoms are stable.  Has no significant abnormalities on neuroexam-if this is contributing to her dizziness symptoms.

## 2022-12-16 NOTE — Assessment & Plan Note (Signed)
This did flare recently.  She is following with orthopedics.  They are managing conservatively for now however they did advise her to follow-up with them soon when she is ready to pursue a back surgery.

## 2022-12-16 NOTE — Progress Notes (Signed)
Emily Wolfe is a 67 y.o. female who presents today for an office visit.  Assessment/Plan:  New/Acute Problems: Dizziness / Fatigue  No red legs.  Likely does have some mild underlying BPPV given her positive Dix-Hallpike maneuver on exam and positional nature of her symptoms.  Given her otherwise normal neurologic exam today do not think we need to obtain imaging imaging and doubt that this represents an MS flare.  We did discuss trial of vestibular rehab which she is agreeable to.  Will place referral today.  Will also check labs to rule out other possible etiologies.  Will order CBC, c-Met, TSH, iron panel, A1c, UA, B12, and vitamin D.  We did encourage good hydration.  She will let us know how she is doing after course of rehab as above.  We discussed reasons to return to care or seek emergent care.  Chronic Problems Addressed Today: Essential hypertension Mildly elevated today.  At goal per JNC 8.  Previously well-controlled.  She will continue to monitor at home and let us know if persistently elevated.  MS (multiple sclerosis) (HCC) Following with allergy for this.  Overall symptoms are stable.  Has no significant abnormalities on neuroexam-if this is contributing to her dizziness symptoms.  Chronic low back pain This did flare recently.  She is following with orthopedics.  They are managing conservatively for now however they did advise her to follow-up with them soon when she is ready to pursue a back surgery.     Subjective:  HPI:  See Assessment / plan for status of chronic conditions.  Patient is here with dizziness and fatigue for the last week and a half. Symptoms do sem to be worse in the morning. Dizziness described as being "unstable." She does feel like she may trip and fall. Does not feel like she is going to pass out. Symptoms do seem to be worse with rotation of her head left or right. Does sometimes get a room spinning sensation. Symptoms are not improving. She does  have headaches at baseline which has not changed much recently. No vision changes. No recent illnesses. No fevers or chills. Hearing has been normal. No tinnitus.   She has been under more stress recently due to her dog going missing. She has since found her dog but she did flare up her chronic back pain due to riding around all day looking for them.        Objective:  Physical Exam: BP (!) 145/83   Pulse 72   Temp 98 F (36.7 C) (Temporal)   Ht 5\' 4"  (1.626 m)   Wt 160 lb 9.6 oz (72.8 kg)   SpO2 96%   BMI 27.57 kg/m   Gen: No acute distress, resting comfortably HEENT: TMs clear bilaterally.  Hearing grossly intact bilaterally. CV: Regular rate and rhythm with no murmurs appreciated Pulm: Normal work of breathing, clear to auscultation bilaterally with no crackles, wheezes, or rhonchi Neuro: Cranial nerves II through XII intact bilaterally.  Finger-nose-finger testing normal bilaterally.  Reflexes 2+ and symmetric bilaterally.  Sensation light touch intact throughout.  Dix-Hallpike mildly positive on left and right. Psych: Normal affect and thought content  Time Spent: 45 minutes of total time was spent on the date of the encounter performing the following actions: chart review prior to seeing the patient including recent visits with specialists, obtaining history, performing a medically necessary exam, counseling on the treatment plan, placing orders, and documenting in our EHR.  [name]. [name], MD 12/16/2022 8:22 AM

## 2022-12-18 ENCOUNTER — Ambulatory Visit (INDEPENDENT_AMBULATORY_CARE_PROVIDER_SITE_OTHER): Payer: Medicare Other | Admitting: *Deleted

## 2022-12-18 ENCOUNTER — Other Ambulatory Visit: Payer: Self-pay

## 2022-12-18 DIAGNOSIS — J309 Allergic rhinitis, unspecified: Secondary | ICD-10-CM | POA: Diagnosis not present

## 2022-12-18 DIAGNOSIS — R42 Dizziness and giddiness: Secondary | ICD-10-CM

## 2022-12-18 DIAGNOSIS — R5383 Other fatigue: Secondary | ICD-10-CM

## 2022-12-18 NOTE — Progress Notes (Signed)
She did have some mild inflammatory markers in her urine.  Can we add on a urine culture or have her come back to check this?  The rest of her labs are stable.  B12 is at goal.  Iron levels are at goal.  Blood counts are normal.  Thyroid level is normal.  Blood sugar is normal-no signs of diabetes.  Vitamin D is normal.  No other obvious explanation for symptoms based on her labs.  I would like for her to follow-up with [place] in a week or 2 to let us know how she is doing after she starts her rehab.

## 2022-12-21 ENCOUNTER — Other Ambulatory Visit: Payer: Medicare Other

## 2022-12-23 ENCOUNTER — Encounter: Payer: Self-pay | Admitting: Family Medicine

## 2022-12-23 ENCOUNTER — Other Ambulatory Visit: Payer: Self-pay

## 2022-12-23 ENCOUNTER — Ambulatory Visit: Payer: Medicare Other | Attending: Family Medicine

## 2022-12-23 DIAGNOSIS — R42 Dizziness and giddiness: Secondary | ICD-10-CM

## 2022-12-23 DIAGNOSIS — R5383 Other fatigue: Secondary | ICD-10-CM | POA: Insufficient documentation

## 2022-12-23 DIAGNOSIS — R2681 Unsteadiness on feet: Secondary | ICD-10-CM | POA: Diagnosis not present

## 2022-12-23 NOTE — Therapy (Signed)
OUTPATIENT PHYSICAL THERAPY VESTIBULAR EVALUATION     Patient Name: Emily Wolfe MRN: [id number] DOB:Sep 20, 1955, 67 y.o., female Today's Date: 12/23/2022  END OF SESSION:  PT End of Session - 12/23/22 1615     Visit Number 1    Authorization Type [employer] Medicare    PT Start Time 1615    PT Stop Time 1700    PT Time Calculation (min) 45 min             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   Mild intermittent asthma without complication 11/02/2017   Multiple sclerosis (HCC) 06/29/2017   Neuromuscular disorder (HCC)    Osteoarthritis    Palpitations    Postmenopausal 06/29/2017   Scoliosis of lumbar spine 06/29/2017   Small bowel obstruction (HCC) 06/29/2017   Swallowing difficulty    Umbilical hernia    Urticaria    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   Vitamin D deficiency 06/29/2017   Past Surgical History:  Procedure Laterality Date   ADENOIDECTOMY     CHOLECYSTECTOMY  2009   HERNIA REPAIR     HERNIA REPAIR     6/10, 9/10   HYSTERECTOMY ABDOMINAL WITH SALPINGO-OOPHORECTOMY     HYSTEROTOMY  2008   LAPAROSCOPIC LYSIS OF ADHESIONS     , With small bowel resection and ventral hernia repair with mesh 2019/[place]   MOHS SURGERY     SINOSCOPY     VARICOSE VEIN SURGERY     Patient Active Problem List   Diagnosis Date Noted   Chronic low back pain  12/16/2022   S/P lumbar fusion 05/19/2021   Primary osteoarthritis of right hip 12/27/2020   Osteoarthritis 11/22/2020   Class 1 obesity with serious comorbidity and body mass index (BMI) of 30.0 to 30.9 in adult 01/26/2020   SBO (small bowel obstruction) (HCC) 03/21/2019   Nightmares 03/07/2019   Anxiety 12/20/2018   Cervicalgia 08/18/2018   Cough variant asthma vs UACS 01/03/2018   History of total hysterectomy, due to ovarian cancer 12/26/2017   Genital warts 11/19/2017   History of ovarian cancer 11/19/2017   Seasonal and perennial allergic rhinitis, followed by Allergist, treated with saline rinses and allergy shots 11/02/2017   Mild intermittent asthma without complication 11/02/2017   Hyperlipidemia 06/29/2017   Essential hypertension 06/29/2017   MS (multiple sclerosis) (HCC) 06/29/2017   Scoliosis of lumbar spine 06/29/2017   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 Denies issue being MS flare, no diplopia, and denies worsening any weakness or sensory disturbance. Reports remote hx of vertigo related to MS flare and notes this current episode is not reminiscent of that symptom. Does not seem to be provoked by position changes as she notes episodes can intensify if lying supine in bed reading or with motion.   Pt accompanied by: self  PERTINENT HISTORY: MS, hx of lumbar fusion  PAIN:  Are you having pain?  Reports chronic back  pain  PRECAUTIONS: Fall  RED FLAGS: None   WEIGHT BEARING RESTRICTIONS: No  FALLS: Has patient fallen in last 6 months? Yes. Number of falls several accidents reported   PLOF: Independent  PATIENT GOALS: figure out symptom of lightheadedness  OBJECTIVE:  Note: Objective measures were completed at Evaluation unless otherwise noted.  DIAGNOSTIC FINDINGS: n/a for this episode  COGNITION: Overall cognitive status: Within functional limits for tasks assessed    POSTURE:  No Significant postural limitations  Cervical ROM:    AROM WFL  STRENGTH: NT     TRANSFERS: Assistive device utilized: None  Sit to stand: Complete Independence Stand to sit: Complete Independence Chair to chair: Complete Independence Floor:  NT    GAIT: Gait pattern: WFL Distance walked:  Assistive device utilized: None Level of assistance: Complete Independence Comments:     PATIENT SURVEYS:  FOTO not loaded  VESTIBULAR ASSESSMENT:  GENERAL OBSERVATION: wears reading glasses and for distance   SYMPTOM BEHAVIOR:  Subjective history: 2 week onset of lightheadedness  Non-Vestibular symptoms:  none noted  Type of dizziness: Lightheadedness/Faint  Frequency: daily  Duration: constant  Aggravating factors: No known aggravating factors and Spontaneous  Relieving factors: no known relieving factors  Progression of symptoms: unchanged  OCULOMOTOR EXAM:  Ocular Alignment: normal  Ocular ROM: No Limitations  Spontaneous Nystagmus: absent  Gaze-Induced Nystagmus:  horizontal, very subtle, minimized with fixation  Smooth Pursuits: intact  Saccades: hypermetric/overshoots  Convergence/Divergence: 3 cm     VESTIBULAR - OCULAR REFLEX:   Slow VOR: Normal  VOR Cancellation: Normal  Head-Impulse Test: HIT Right: negative HIT Left: negative  Dynamic Visual Acuity: Not able to be assessed   POSITIONAL TESTING: Right Dix-Hallpike: no nystagmus Left Dix-Hallpike: no nystagmus Right  Roll Test: no nystagmus Left Roll Test: no nystagmus  MOTION SENSITIVITY:  Motion Sensitivity Quotient Intensity: 0 = none, 1 = Lightheaded, 2 = Mild, 3 = Moderate, 4 = Severe, 5 = Vomiting  Intensity  1. Sitting to supine   2. Supine to L side   3. Supine to R side   4. Supine to sitting   5. L Hallpike-Dix   6. Up from L    7. R Hallpike-Dix   8. Up from R    9. Sitting, head tipped to L knee   10. Head up from L knee   11. Sitting, head tipped to R knee   12. Head up from R knee   13. Sitting head turns x5   14.Sitting head nods x5   15. In stance, 180 turn to L    16. In stance, 180 turn to R     OTHOSTATICS: Supine: 149/89 with HR 72 bpm; Standing 1 min: 141/97 with HR 90 bpm;  Standing x 3 min: 159/98 with HR 81  FUNCTIONAL GAIT:  TBD    PATIENT EDUCATION: Education details: assessment details Person educated: Patient Education method: Explanation Education comprehension: verbalized understanding  HOME EXERCISE PROGRAM:  GOALS: Goals reviewed with patient? No, N/A for episode    ASSESSMENT:  CLINICAL IMPRESSION: Patient is a 67 y.o. lady who was seen today for physical therapy evaluation and treatment for dizziness. Upon interview denies any symptoms of dizziness and notes main issue is a prevailing sensation of lightheadedness independent of position or activity.  Feeling is constant and has episodic intensification without known trigger.  Denies any symptoms of photo/phonophobia, no HA, no tinnitus,  no neck pain, no feeling of dizziness or notable imbalance.  Oculomotor exam unrevealing except for some gaze-evoked nystagmus noted at end-range which is eliminated with gaze fixation.  Vestibular testing negative for any reproduction of symptoms and does not demonstrate any overt balance deficits.  Orthostatic hypotension assessed and no resultant drop in pressures appreciated.  Discussed findings with patient and suggested further medical follow-up.   OBJECTIVE  IMPAIRMENTS:  lightheadedness .   ACTIVITY LIMITATIONS:  reports general activity tolerance limitations due to feeling of lightheadedness  PARTICIPATION LIMITATIONS:  reports performing pilates class but is currently limiting driving and walking dogs due to pervasive symptom  PERSONAL FACTORS: Age, Time since onset of injury/illness/exacerbation, and 1-2 comorbidities: MS, LBP/hx of surgery  are also affecting patient's functional outcome.   REHAB POTENTIAL: N/A  CLINICAL DECISION MAKING: Evolving/moderate complexity  EVALUATION COMPLEXITY: Moderate   PLAN:  PT FREQUENCY: one time visit  PT DURATION: recommend medical f/u  PLANNED INTERVENTIONS: PT evaluation moderate complexity   PLAN FOR NEXT SESSION: n/a   5:12 PM, 12/23/22 M. Shary Decamp, PT, DPT Physical Therapist- [place] Office Number: [phone]

## 2022-12-28 ENCOUNTER — Other Ambulatory Visit: Payer: Medicare Other

## 2022-12-28 DIAGNOSIS — R42 Dizziness and giddiness: Secondary | ICD-10-CM | POA: Diagnosis not present

## 2022-12-28 DIAGNOSIS — R5383 Other fatigue: Secondary | ICD-10-CM

## 2022-12-28 NOTE — Telephone Encounter (Signed)
Patient need to came back to recheck urine

## 2022-12-28 NOTE — Telephone Encounter (Signed)
Spoke with patient, notified lab was unable to process sample  Patient will came back to give another urine sample

## 2022-12-29 LAB — URINE CULTURE
MICRO NUMBER:: 15883356
Result:: NO GROWTH
SPECIMEN QUALITY:: ADEQUATE

## 2023-01-04 NOTE — Progress Notes (Signed)
Her urine culture is negative for UTI.  Do not need to do any other medications or antibiotics at this point.  She should let us know if symptoms are not improving.

## 2023-01-05 ENCOUNTER — Ambulatory Visit (INDEPENDENT_AMBULATORY_CARE_PROVIDER_SITE_OTHER): Payer: Medicare Other | Admitting: *Deleted

## 2023-01-05 DIAGNOSIS — J309 Allergic rhinitis, unspecified: Secondary | ICD-10-CM | POA: Diagnosis not present

## 2023-01-15 ENCOUNTER — Ambulatory Visit (INDEPENDENT_AMBULATORY_CARE_PROVIDER_SITE_OTHER): Payer: Medicare Other | Admitting: *Deleted

## 2023-01-15 DIAGNOSIS — J309 Allergic rhinitis, unspecified: Secondary | ICD-10-CM | POA: Diagnosis not present

## 2023-02-02 ENCOUNTER — Ambulatory Visit (INDEPENDENT_AMBULATORY_CARE_PROVIDER_SITE_OTHER): Payer: Medicare Other | Admitting: *Deleted

## 2023-02-02 DIAGNOSIS — J309 Allergic rhinitis, unspecified: Secondary | ICD-10-CM | POA: Diagnosis not present

## 2023-02-11 ENCOUNTER — Ambulatory Visit (INDEPENDENT_AMBULATORY_CARE_PROVIDER_SITE_OTHER): Payer: Medicare Other

## 2023-02-11 DIAGNOSIS — J309 Allergic rhinitis, unspecified: Secondary | ICD-10-CM | POA: Diagnosis not present

## 2023-02-18 ENCOUNTER — Ambulatory Visit (INDEPENDENT_AMBULATORY_CARE_PROVIDER_SITE_OTHER): Payer: Medicare Other

## 2023-02-18 DIAGNOSIS — J309 Allergic rhinitis, unspecified: Secondary | ICD-10-CM | POA: Diagnosis not present

## 2023-03-02 DIAGNOSIS — G35 Multiple sclerosis: Secondary | ICD-10-CM | POA: Diagnosis not present

## 2023-03-11 ENCOUNTER — Ambulatory Visit (INDEPENDENT_AMBULATORY_CARE_PROVIDER_SITE_OTHER)

## 2023-03-11 DIAGNOSIS — J309 Allergic rhinitis, unspecified: Secondary | ICD-10-CM

## 2023-03-23 ENCOUNTER — Ambulatory Visit (INDEPENDENT_AMBULATORY_CARE_PROVIDER_SITE_OTHER): Admitting: *Deleted

## 2023-03-23 DIAGNOSIS — J309 Allergic rhinitis, unspecified: Secondary | ICD-10-CM

## 2023-03-27 DIAGNOSIS — G35 Multiple sclerosis: Secondary | ICD-10-CM | POA: Diagnosis not present

## 2023-03-31 ENCOUNTER — Ambulatory Visit: Payer: Self-pay

## 2023-03-31 ENCOUNTER — Ambulatory Visit (INDEPENDENT_AMBULATORY_CARE_PROVIDER_SITE_OTHER): Admitting: *Deleted

## 2023-03-31 DIAGNOSIS — J309 Allergic rhinitis, unspecified: Secondary | ICD-10-CM

## 2023-03-31 NOTE — Telephone Encounter (Signed)
  Chief Complaint: chronic dizziness Symptoms: dizziness, lightheadedness,  Frequency: about 4 months Pertinent Negatives: Patient denies fever, N/V/D, vision changes, speech changes, numbness, weakness,  Disposition: [] ED /[] Urgent Care (no appt availability in office) / [x] Appointment(In office/virtual)/ []  [place] [facility]/ [] Home Care/ [] Refused Recommended Disposition /[] [place] [facility]/ []  Follow-up with PCP Additional Notes: Pt states that she has dizziness for "months". Pt states it occurs most severely in the morning when she sits up. Pt states that she was talking to her nurse friend that told her it may be d/t low BP. Pt states that she has situational dizziness. Pt had f/u with neurology and was told to f/u with PCP. Pt states she has a hx of MS. 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 Pt upset that it "doesn't seem to be bothering my neurologist". Sched and given care instructions per Epic.  Copied from CRM [id number]. Topic: Clinical - Red Word Triage >> Mar 31, 2023  2:22 PM Almira Coaster wrote: Red Word that prompted transfer to Nurse Triage: Patient has been experiencing dizzy spells and believes it can be caused by low blood pressure, asked patient if she's taken her blood pressure for readings and she said no. She has been dealing with this dizziness for several months and mostly when she changes position either standing to sitting or laying down to sitting up. Reason for Disposition  [1] MODERATE dizziness (e.g., interferes with normal activities) AND [2] has been evaluated by doctor (or NP/PA) for this  Answer Assessment - Initial Assessment Questions 1. DESCRIPTION: "Describe your dizziness."     Really overwhelming sense of spinning, disorientation 2. LIGHTHEADED: "Do you feel lightheaded?" (e.g., somewhat faint, woozy, weak upon standing)     Sitting up from laying flat, I have to fall back immediately. Pt states that her dizziness and lightheadedness are 2  different things 3. VERTIGO: "Do you feel like either you or the room is spinning or tilting?" (i.e. vertigo)     Cascading weird sensation from top of head down 4. SEVERITY: "How bad is it?"  "Do you feel like you are going to faint?" "Can you stand and walk?"   - MILD: Feels slightly dizzy, but walking normally.   - MODERATE: Feels unsteady when walking, but not falling; interferes with normal activities (e.g., school, work).   - SEVERE: Unable to walk without falling, or requires assistance to walk without falling; feels like passing out now.      Pt states that she is off balance most mornings 5. ONSET:  "When did the dizziness begin?"     About 4 months ago 6. AGGRAVATING FACTORS: "Does anything make it worse?" (e.g., standing, change in head position)     Change in head position makes worse, dizziness decreases and eventually subsides 7. HEART RATE: "Can you tell me your heart rate?" "How many beats in 15 seconds?"  (Note: not all patients can do this)       denies 8. CAUSE: "What do you think is causing the dizziness?"     Unsure, hx of MS 9. RECURRENT SYMPTOM: "Have you had dizziness before?" If Yes, ask: "When was the last time?" "What happened that time?"     denies 10. OTHER SYMPTOMS: "Do you have any other symptoms?" (e.g., fever, chest pain, vomiting, diarrhea, bleeding)       denies  Protocols used: Dizziness - Lightheadedness-A-AH

## 2023-03-31 NOTE — Telephone Encounter (Signed)
 noted

## 2023-04-01 ENCOUNTER — Encounter: Payer: Self-pay | Admitting: Family Medicine

## 2023-04-01 ENCOUNTER — Ambulatory Visit (INDEPENDENT_AMBULATORY_CARE_PROVIDER_SITE_OTHER): Admitting: Family Medicine

## 2023-04-01 VITALS — BP 135/89 | HR 85 | Temp 98.0°F | Ht 64.0 in | Wt 165.0 lb

## 2023-04-01 DIAGNOSIS — I10 Essential (primary) hypertension: Secondary | ICD-10-CM

## 2023-04-01 DIAGNOSIS — R42 Dizziness and giddiness: Secondary | ICD-10-CM

## 2023-04-01 DIAGNOSIS — G35 Multiple sclerosis: Secondary | ICD-10-CM | POA: Diagnosis not present

## 2023-04-01 NOTE — Progress Notes (Unsigned)
   Emily Wolfe is a 68 y.o. female who presents today for an office visit.  Assessment/Plan:  New/Acute Problems: Dizziness / Orthostatic Hypotension  Patient has had extensive workup for her dizziness thus far including labs and brain MRI via neurology which were all unremarkable.  She also did see vestibular rehab a few months ago however, they did not think her symptoms were related to BPPV.  Patient did have a 34 point drop in systolic blood pressure today going from sitting to standing.  She may have autonomic dysfunction related to her MS which is likely contributing to her symptoms.  She is not currently on any antihypertensives or other medications that may cause orthostatic hypotension.  Will place referral to cardiology for further evaluation and management.  Chronic Problems Addressed Today: Essential hypertension Her sitting blood pressure is at goal without meds though concern for possible orthostatic hypotension and autonomic dysfunction as above.  Will be placing referral to cardiology for further evaluation.  MS (multiple sclerosis) (HCC) Follows with neurology for this.  She has had an extensive workup recently including MRI which was negative.  Neurology does not think that her MS is contributing to her dizziness.     Subjective:  HPI:  Patient here today for follow-up.  I last saw her about 3 months ago for dizziness and fatigue.  We checked labs at that time including CBC, c-Met, TSH, iron panel, A1c, UA, B12, and vitamin D.  All of her lab work was negative.  There was concern for BPPV and we referred her to see vestibular rehab.  She had 1 visit with them.  They did not feel like her symptoms were related to any vestibular issues.  She is subsequently followed with neurology about a month or so later.  They ended up getting an MRI which did not show any changes from her baseline.  Her fatigue has improved though still has persistent dizziness and feeling off balance.   This happens anytime that she changes position such as going from sitting to standing or from lying to sitting.  She feels okay at rest or when not moving.  She also additionally gets symptoms when going from sitting to laying down.  She has not lost consciousness though feels unsteady and off balance.  No weakness or numbness.  She has not had any recent illnesses.  No medication changes.       Objective:  Physical Exam: BP 135/89 (Patient Position: Standing)   Pulse 85   Temp 98 F (36.7 C) (Temporal)   Ht 5\' 4"  (1.626 m)   Wt 165 lb (74.8 kg)   SpO2 97%   BMI 28.32 kg/m   Orthostatic Vitals: -Supine: 155/89, 77 -Sitting: 169,91, 79 -Standing: 135/89, 85   Gen: No acute distress, resting comfortably CV: Regular rate and rhythm with no murmurs appreciated Pulm: Normal work of breathing, clear to auscultation bilaterally with no crackles, wheezes, or rhonchi Neuro: Grossly normal, moves all extremities Psych: Normal affect and thought content      Chantavia Bazzle M. [name], MD 04/01/2023 3:23 PM

## 2023-04-01 NOTE — Patient Instructions (Addendum)
 It was very nice to see you today!  You are having a blood pressure drop when you change positions.  Believe this is what is causing your symptoms.  I will refer you to see the cardiologist.  Please let us know if your symptoms change.  Return if symptoms worsen or fail to improve.   Take care, Dr [name]  PLEASE NOTE:  If you had any lab tests, please let us know if you have not heard back within a few days. You may see your results on mychart before we have a chance to review them but we will give you a call once they are reviewed by [place].   If we ordered any referrals today, please let us know if you have not heard from their office within the next week.   If you had any urgent prescriptions sent in today, please check with the pharmacy within an hour of our visit to make sure the prescription was transmitted appropriately.   Please try these tips to maintain a healthy lifestyle:  Eat at least 3 REAL meals and 1-2 snacks per day.  Aim for no more than 5 hours between eating.  If you eat breakfast, please do so within one hour of getting up.   Each meal should contain half fruits/vegetables, one quarter protein, and one quarter carbs (no bigger than a computer mouse)  Cut down on sweet beverages. This includes juice, soda, and sweet tea.   Drink at least 1 glass of water with each meal and aim for at least 8 glasses per day  Exercise at least 150 minutes every week.

## 2023-04-01 NOTE — Assessment & Plan Note (Signed)
 Her sitting blood pressure is at goal without meds though concern for possible orthostatic hypotension and autonomic dysfunction as above.  Will be placing referral to cardiology for further evaluation.

## 2023-04-01 NOTE — Assessment & Plan Note (Signed)
 Follows with neurology for this.  She has had an extensive workup recently including MRI which was negative.  Neurology does not think that her MS is contributing to her dizziness.

## 2023-04-20 ENCOUNTER — Ambulatory Visit (INDEPENDENT_AMBULATORY_CARE_PROVIDER_SITE_OTHER): Payer: Self-pay

## 2023-04-20 DIAGNOSIS — J309 Allergic rhinitis, unspecified: Secondary | ICD-10-CM

## 2023-04-26 ENCOUNTER — Other Ambulatory Visit: Payer: Self-pay | Admitting: Family Medicine

## 2023-04-27 NOTE — Telephone Encounter (Signed)
 Last refill 3 years ago (01/24/2020) by Historical Provider

## 2023-04-29 ENCOUNTER — Ambulatory Visit (INDEPENDENT_AMBULATORY_CARE_PROVIDER_SITE_OTHER): Payer: Self-pay

## 2023-04-29 DIAGNOSIS — J309 Allergic rhinitis, unspecified: Secondary | ICD-10-CM

## 2023-04-30 ENCOUNTER — Encounter: Payer: Self-pay | Admitting: Family Medicine

## 2023-04-30 ENCOUNTER — Ambulatory Visit (INDEPENDENT_AMBULATORY_CARE_PROVIDER_SITE_OTHER): Admitting: Family Medicine

## 2023-04-30 VITALS — BP 140/90 | HR 68 | Temp 97.9°F | Ht 64.0 in | Wt 157.0 lb

## 2023-04-30 DIAGNOSIS — G35 Multiple sclerosis: Secondary | ICD-10-CM

## 2023-04-30 DIAGNOSIS — I10 Essential (primary) hypertension: Secondary | ICD-10-CM

## 2023-04-30 DIAGNOSIS — E559 Vitamin D deficiency, unspecified: Secondary | ICD-10-CM | POA: Diagnosis not present

## 2023-04-30 MED ORDER — VITAMIN D3 1.25 MG (50000 UT) PO CAPS: 1.0000 | ORAL_CAPSULE | ORAL | 4 refills | Status: AC

## 2023-04-30 NOTE — Patient Instructions (Signed)
 It was very nice to see you today!  I will refill your medications.  Please monitor your blood pressure and let us  know if persistently elevated.  Return if symptoms worsen or fail to improve.   Take care, Dr Daneil Dunker  PLEASE NOTE:  If you had any lab tests, please let us  know if you have not heard back within a few days. You may see your results on mychart before we have a chance to review them but we will give you a call once they are reviewed by us .   If we ordered any referrals today, please let us  know if you have not heard from their office within the next week.   If you had any urgent prescriptions sent in today, please check with the pharmacy within an hour of our visit to make sure the prescription was transmitted appropriately.   Please try these tips to maintain a healthy lifestyle:  Eat at least 3 REAL meals and 1-2 snacks per day.  Aim for no more than 5 hours between eating.  If you eat breakfast, please do so within one hour of getting up.   Each meal should contain half fruits/vegetables, one quarter protein, and one quarter carbs (no bigger than a computer mouse)  Cut down on sweet beverages. This includes juice, soda, and sweet tea.   Drink at least 1 glass of water with each meal and aim for at least 8 glasses per day  Exercise at least 150 minutes every week.

## 2023-04-30 NOTE — Assessment & Plan Note (Signed)
 Initially elevated.  At goal on recheck.  She will be seeing cardiology soon.  She will monitor at home and let us  know if persistently elevated or if she has any other symptomatic lows.

## 2023-04-30 NOTE — Assessment & Plan Note (Signed)
 Following with neurology.  Will be seeing her neurologist in [place] in a couple of months.

## 2023-04-30 NOTE — Progress Notes (Signed)
   Emily Wolfe is a 68 y.o. female who presents today for an office visit.  Assessment/Plan:  Chronic Problems Addressed Today: Vitamin D  deficiency On 50,000 IUs weekly.  Previously was getting this from urology however was had difficulty with getting a refill from them.  Will refill today.  MS (multiple sclerosis) (HCC) Following with neurology.  Will be seeing her neurologist in [place] in a couple of months.  Essential hypertension Initially elevated.  At goal on recheck.  She will be seeing cardiology soon.  She will monitor at home and let us  know if persistently elevated or if she has any other symptomatic lows.     Subjective:  HPI:  See A/P for status of chronic conditions.  Patient is here today for follow-up.  I saw her about a month ago for dizziness.  There was concern for orthostatic hypotension at that time.  We referred her to cardiology.  She did have 1 severe episode of dizziness but not as bad as her previous flares.  She will be seeing cardiology in a few weeks.  She needs us  to refill medications today until she can get an see neurology at [facility].       Objective:  Physical Exam: BP (!) 140/90   Pulse 68   Temp 97.9 F (36.6 C) (Temporal)   Ht 5\' 4"  (1.626 m)   Wt 157 lb (71.2 kg)   SpO2 97%   BMI 26.95 kg/m   Gen: No acute distress, resting comfortably Neuro: Grossly normal, moves all extremities Psych: Normal affect and thought content      Remington Skalsky M. Daneil Dunker, MD 04/30/2023 11:17 AM

## 2023-04-30 NOTE — Assessment & Plan Note (Signed)
 On 50,000 IUs weekly.  Previously was getting this from urology however was had difficulty with getting a refill from them.  Will refill today.

## 2023-05-06 ENCOUNTER — Ambulatory Visit (INDEPENDENT_AMBULATORY_CARE_PROVIDER_SITE_OTHER): Payer: Self-pay

## 2023-05-06 DIAGNOSIS — J309 Allergic rhinitis, unspecified: Secondary | ICD-10-CM | POA: Diagnosis not present

## 2023-05-12 ENCOUNTER — Ambulatory Visit (INDEPENDENT_AMBULATORY_CARE_PROVIDER_SITE_OTHER)

## 2023-05-12 DIAGNOSIS — J309 Allergic rhinitis, unspecified: Secondary | ICD-10-CM

## 2023-05-19 DIAGNOSIS — J301 Allergic rhinitis due to pollen: Secondary | ICD-10-CM | POA: Diagnosis not present

## 2023-05-19 NOTE — Progress Notes (Signed)
 VIALS MADE 05-19-23

## 2023-05-20 DIAGNOSIS — J302 Other seasonal allergic rhinitis: Secondary | ICD-10-CM | POA: Diagnosis not present

## 2023-05-27 ENCOUNTER — Ambulatory Visit (INDEPENDENT_AMBULATORY_CARE_PROVIDER_SITE_OTHER)

## 2023-05-27 DIAGNOSIS — J309 Allergic rhinitis, unspecified: Secondary | ICD-10-CM

## 2023-06-03 ENCOUNTER — Ambulatory Visit (INDEPENDENT_AMBULATORY_CARE_PROVIDER_SITE_OTHER)

## 2023-06-03 DIAGNOSIS — J309 Allergic rhinitis, unspecified: Secondary | ICD-10-CM

## 2023-06-07 DIAGNOSIS — K5900 Constipation, unspecified: Secondary | ICD-10-CM | POA: Diagnosis not present

## 2023-06-07 DIAGNOSIS — R110 Nausea: Secondary | ICD-10-CM | POA: Diagnosis not present

## 2023-06-10 ENCOUNTER — Ambulatory Visit (INDEPENDENT_AMBULATORY_CARE_PROVIDER_SITE_OTHER)

## 2023-06-10 DIAGNOSIS — J309 Allergic rhinitis, unspecified: Secondary | ICD-10-CM

## 2023-06-18 ENCOUNTER — Ambulatory Visit (INDEPENDENT_AMBULATORY_CARE_PROVIDER_SITE_OTHER)

## 2023-06-18 DIAGNOSIS — J309 Allergic rhinitis, unspecified: Secondary | ICD-10-CM | POA: Diagnosis not present

## 2023-07-01 ENCOUNTER — Ambulatory Visit: Admitting: Allergy & Immunology

## 2023-07-01 ENCOUNTER — Other Ambulatory Visit: Payer: Self-pay

## 2023-07-01 VITALS — BP 110/80 | HR 90 | Temp 98.2°F | Resp 18 | Ht 62.6 in | Wt 149.0 lb

## 2023-07-01 DIAGNOSIS — J3089 Other allergic rhinitis: Secondary | ICD-10-CM | POA: Diagnosis not present

## 2023-07-01 DIAGNOSIS — J302 Other seasonal allergic rhinitis: Secondary | ICD-10-CM

## 2023-07-01 DIAGNOSIS — G35 Multiple sclerosis: Secondary | ICD-10-CM

## 2023-07-01 DIAGNOSIS — J3489 Other specified disorders of nose and nasal sinuses: Secondary | ICD-10-CM

## 2023-07-01 NOTE — Patient Instructions (Addendum)
 1. Seasonal and perennial allergic rhinitis (ragweed, grasses, indoor molds and outdoor molds)-controlled - We are going to retest you to see if you have developed new allergies.  - This might help us  to reformulate your vials.  - Continue to bring your epinphrine autoinjector to your injections. - Continue with: antihistamine and nose sprays as you are doing  2.  Nasal obstruction s/p turbinate reduction surgery) - Continue to follow with Dr. Karis ENT.   3. Return in about 2 weeks (around 07/15/2023) for SKIN TESTING (1-55).    Please inform us  of any Emergency Department visits, hospitalizations, or changes in symptoms. Call us  before going to the ED for breathing or allergy  symptoms since we might be able to fit you in for a sick visit. Feel free to contact us  anytime with any questions, problems, or concerns.  It was a pleasure to see you again today!  Websites that have reliable patient information: 1. American Academy of Asthma, Allergy , and Immunology: www.aaaai.org 2. Food Allergy  Research and Education (FARE): foodallergy.org 3. Mothers of Asthmatics: http://www.asthmacommunitynetwork.org 4. [employer] of Allergy , Asthma, and Immunology: www.acaai.org

## 2023-07-01 NOTE — Progress Notes (Signed)
 FOLLOW UP  Date of Service/Encounter:  07/01/23   Assessment:   Seasonal and perennial allergic rhinitis (ragweed, grasses, indoor molds and outdoor molds) - on allergen immunotherapy with maintenance reached March 2020   Left sided nasal obstruction - referring to Dr. Karis today   Fully vaccinated against COVID19, but needs bivalent booster   Complicated past medical history, including multiple sclerosis  Plan/Recommendations:   1. Seasonal and perennial allergic rhinitis (ragweed, grasses, indoor molds and outdoor molds)-controlled - We are going to retest you to see if you have developed new allergies.  - This might help us  to reformulate your vials.  - Continue to bring your epinphrine autoinjector to your injections. - Continue with: antihistamine and nose sprays as you are doing  2.  Nasal obstruction s/p turbinate reduction surgery) - Continue to follow with Dr. Karis ENT.   3. Return in about 2 weeks (around 07/15/2023) for SKIN TESTING (1-55).   Subjective:   Emily Wolfe is a 68 y.o. female presenting today for follow up of  Chief Complaint  Patient presents with   Allergic Rhinitis     Pt reports no concerns or questions.     Emily Wolfe has a history of the following: Patient Active Problem List   Diagnosis Date Noted   Chronic low back pain 12/16/2022   S/P lumbar fusion 05/19/2021   Primary osteoarthritis of right hip 12/27/2020   Osteoarthritis 11/22/2020   Class 1 obesity with serious comorbidity and body mass index (BMI) of 30.0 to 30.9 in adult 01/26/2020   SBO (small bowel obstruction) (HCC) 03/21/2019   Nightmares 03/07/2019   Anxiety 12/20/2018   Cervicalgia 08/18/2018   Cough variant asthma vs UACS 01/03/2018   History of total hysterectomy, due to ovarian cancer 12/26/2017   Genital warts 11/19/2017   History of ovarian cancer 11/19/2017   Seasonal and perennial allergic rhinitis, followed by Allergist, treated with saline rinses  and allergy  shots 11/02/2017   Mild intermittent asthma without complication 11/02/2017   Hyperlipidemia 06/29/2017   Essential hypertension 06/29/2017   MS (multiple sclerosis) (HCC) 06/29/2017   Scoliosis of lumbar spine 06/29/2017   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  Discussed the use of AI scribe software for clinical note transcription with the patient and/or guardian, who gave verbal consent to proceed.  Emily Wolfe is a 68 y.o. female presenting for a follow up visit.  She was last seen in February 2023 by me.  At that time, we continue with her antihistamine, Flonase, and nasal saline rinses.  We referred her to ENT for evaluation of the left nasal obstruction.  She saw Dr. Marinda and July 2024.  At that time, she had recently undergone sinus surgery in December 2023 though still having difficulty breathing out of her left side.  She recently had back surgery and was going to physical therapy 4 times a week as well.  Since last visit, she has mostly done well.  Allergic Rhinitis Symptom History: She has a history of extensive allergies, initially testing positive for numerous allergens. After moving to [facility], subsequent testing showed fewer allergies. She was previously on allergy  shots for two to three years before moving. Her allergies were severe enough to delay sinus surgery until they were managed.  She has  persistent sinus issues despite previous nasal turbinate surgery, which did not alleviate her symptoms. She experiences a runny nose and mouth breathing due to a deviated septum. Weekly allergy  shots provide partial relief, but she continues to suffer from itchy eyes and sinus problems. She takes a daily allergy  pill and uses nasal sprays every other  day.  Emily Wolfe is on allergen immunotherapy. She receives two injections. Immunotherapy script #1 contains ragweed and grasses. She currently receives 0.50mL of the RED vial (1/100). Immunotherapy script #2 contains molds. She currently receives 0.50mL of the RED vial (1/100). She started shots December of 2019 and reached maintenance in March of 2020. She hs preferred to remain on weekly allergy  shots.   She had a brain MRI in March 2025 to check on her MS:    FINDINGS:   Calvarium/skull base: No focal marrow replacing lesion. Small amount of nonspecific right mastoid fluid.   Orbits: No mass lesion. This study protocol is not optimized for optic nerve evaluation.   Paranasal sinuses: No air-fluid levels.   Brain: Similar appearance of multiple white matter lesions. No new lesions identified. No corresponding enhancement or restricted diffusion.   No evidence of acute infarct. No mass effect. No evidence of acute hemorrhage. No hydrocephalus. Generalized brain parenchymal volume loss. 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 No abnormal enhancement. Normal appearance of the major intracranial arteries and dural venous sinuses.   IMPRESSION:   No new or progressive white matter disease. No evidence of active demyelination or other acute intracranial abnormality.   She has a significant surgical history, including back surgery two years ago, with plans for another surgery involving more pins and screws due to persistent pain. She experiences leg pain and has had shots for relief.  She has a complex gastrointestinal history, having had twelve feet of her intestine removed due to a rupture following a hysterectomy for endometriosis. She experiences severe pain episodes, sometimes requiring ER visits, and must avoid high-fiber foods like apples.  She is carries a diagnosis of multiple sclerosis and  experiences dizzy spells. Her MS management includes antispasmodic medication and previous treatments like Gilenya and infusions. She has seen a cardiologist for blood pressure issues, noting a significant drop when sitting up, which may be related to her MS.  She has a history of multiple sclerosis symptom onset 1993, diagnosed in 1999, she has been on Avonex and Copaxone, she has also been on Gilenya, most recently treated by her neurologist at [facility] with leflunomide . 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   Socially, she is retired and lives with her three dogs, two collies, and a small dog she describes as difficult. She has support from neighbors and uses a dog boarding service when needed.     Otherwise, there have been no changes to her past medical history, surgical history, family history, or social history.    Review of systems otherwise negative other than that mentioned in the HPI.    Objective:   Blood pressure 110/80, pulse 90, temperature 98.2 F (36.8 C), temperature source Temporal, resp. rate 18, height 5' 2.6 (1.59 m), weight 149 lb 0.3 oz (67.6 kg), SpO2 95%. Body mass index is 26.74 kg/m.    Physical Exam Vitals reviewed.  Constitutional:      Appearance: She is well-developed.     Comments: Talkative.   HENT:     Head: Normocephalic and atraumatic.  Right Ear: Tympanic membrane, ear canal and external ear normal.     Left Ear: Tympanic membrane, ear canal and external ear normal.     Nose: No nasal deformity, septal deviation, mucosal edema or rhinorrhea.     Right Turbinates: Enlarged, swollen and pale.     Left Turbinates: Enlarged, swollen and pale.     Right Sinus: Maxillary sinus tenderness present. No frontal sinus tenderness.     Left Sinus: Maxillary sinus tenderness present. No frontal sinus tenderness.     Comments: No polyps.     Mouth/Throat:     Mouth: Mucous membranes are not pale and not dry.     Pharynx: Uvula  midline.   Eyes:     General: Lids are normal. No allergic shiner.       Right eye: No discharge.        Left eye: No discharge.     Conjunctiva/sclera: Conjunctivae normal.     Right eye: Right conjunctiva is not injected. No chemosis.    Left eye: Left conjunctiva is not injected. No chemosis.    Pupils: Pupils are equal, round, and reactive to light.    Cardiovascular:     Rate and Rhythm: Normal rate and regular rhythm.     Heart sounds: Normal heart sounds.  Pulmonary:     Effort: Pulmonary effort is normal. No tachypnea, accessory muscle usage or respiratory distress.     Breath sounds: Normal breath sounds. No wheezing, rhonchi or rales.  Chest:     Chest wall: No tenderness.  Lymphadenopathy:     Cervical: No cervical adenopathy.   Skin:    Coloration: Skin is not pale.     Findings: No abrasion, erythema, petechiae or rash. Rash is not papular, urticarial or vesicular.   Neurological:     Mental Status: She is alert.   Psychiatric:        Behavior: Behavior is cooperative.      Diagnostic studies: none        Marty Shaggy, MD  [facility] 

## 2023-07-02 NOTE — Progress Notes (Unsigned)
 Cardiology Office Note:    Date:  07/08/2023   ID:  Emily Wolfe, DOB [birth year], MRN [id number]  PCP:  Kennyth Worth HERO, MD   [facility] Providers Cardiologist:  None     Referring MD: Kennyth Worth HERO, MD   Chief Complaint  Patient presents with   Dizziness    History of Present Illness:    Emily Wolfe is a 68 y.o. female seen at the request of Dr Kennyth for evaluation of dizziness/ possible orthostatic hypotension. She has a history of multiple sclerosis. She reports that in Dec and Jan she developed dizziness. This became worse in Feb/March. Really 2 types of dizziness. One is a cascading sensation of lightheadedness beginning at the top of her head spreading down. No syncope. Has had to pull over while driving. The other symptom occurs when going from supine to sitting and is associated with things spinning around. She usually just holds on until symptoms pass. Lying back down makes it worse. No nausea or sweating. No chest pain, SOB, or palpitations. States when it was really bad she was found to have orthostatic hypotension in her primary care's office. She hasn't had prior cardiac evaluation in over 10 years.  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   Mild intermittent asthma without complication 11/02/2017   Multiple sclerosis (HCC) 06/29/2017   Neuromuscular  disorder (HCC)    Osteoarthritis    Palpitations    Postmenopausal 06/29/2017   Scoliosis of lumbar spine 06/29/2017   Small bowel obstruction (HCC) 06/29/2017   Swallowing difficulty    Umbilical hernia    Urticaria    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   Vitamin D  deficiency 06/29/2017    Past Surgical History:  Procedure Laterality Date   ADENOIDECTOMY     CHOLECYSTECTOMY  2009   HERNIA REPAIR     HERNIA REPAIR     6/10, 9/10   HYSTERECTOMY ABDOMINAL WITH SALPINGO-OOPHORECTOMY     HYSTEROTOMY  2008   LAPAROSCOPIC LYSIS OF ADHESIONS     , With small bowel resection and ventral hernia repair with mesh 2019/[place]   MOHS SURGERY     SINOSCOPY     VARICOSE VEIN SURGERY      Current Medications: Current Meds  Medication Sig   azelastine  (ASTELIN ) 0.1 % nasal spray Place 1 spray into both nostrils 2 (two) times daily as needed for rhinitis. Use in each nostril as directed   celecoxib  (CELEBREX ) 200 MG capsule TAKE 1 CAPSULE BY MOUTH 2 TIMES DAILY   Cholecalciferol (VITAMIN D3) 1.25 MG (50000 UT) CAPS Take 1 capsule (1.25 mg total) by mouth once a week.   EPINEPHrine  (EPIPEN  2-PAK) 0.3 mg/0.3 mL IJ SOAJ injection Inject 0.3 mg into the muscle as needed for anaphylaxis.  gabapentin  (NEURONTIN ) 300 MG capsule Take 1 capsule (300 mg total) by mouth 3 (three) times daily as needed.   leflunomide  (ARAVA ) 10 MG tablet Take 10 mg by mouth every evening.     Allergies:   Grass pollen([id number]-o-r-t-swt vern)   Social History   Socioeconomic History   Marital status: Divorced    Spouse name: Not on file   Number of children: Not on file   Years of education: Not on file   Highest education level: Master's degree (e.g., MA, MS, MEng, MEd, MSW, MBA)  Occupational History   Not on file  Tobacco Use   Smoking status: Never   Smokeless tobacco: Never  Vaping Use   Vaping status: Never Used  Substance and Sexual Activity   Alcohol use: Not Currently   Drug use: Not  Currently   Sexual activity: Not Currently  Other Topics Concern   Not on file  Social History Narrative   The patient does wear seatbelts. The patient does use sunscreen or protective clothing regularly. She does participate in regular exercise. Her exercise is: elliptical, approximately one time per week. She does not have a secured firearm in the home. She denies a history of intimate partner violence.   Social Drivers of Corporate investment banker Strain: Low Risk  (04/29/2023)   Overall Financial Resource Strain (CARDIA)    Difficulty of Paying Living Expenses: Not hard at all  Food Insecurity: No Food Insecurity (04/29/2023)   Hunger Vital Sign    Worried About Running Out of Food in the Last Year: Never true    Ran Out of Food in the Last Year: Never true  Transportation Needs: No Transportation Needs (04/29/2023)   PRAPARE - Administrator, Civil Service (Medical): No    Lack of Transportation (Non-Medical): No  Physical Activity: Sufficiently Active (04/29/2023)   Exercise Vital Sign    Days of Exercise per Week: 5 days    Minutes of Exercise per Session: 30 min  Stress: No Stress Concern Present (04/29/2023)   [employer] of Occupational Health - Occupational Stress Questionnaire    Feeling of Stress : Only a little  Social Connections: Socially Isolated (04/29/2023)   Social Connection and Isolation Panel    Frequency of Communication with Friends and Family: Once a week    Frequency of Social Gatherings with Friends and Family: Once a week    Attends Religious Services: Never    Database administrator or Organizations: No    Attends Engineer, structural: Never    Marital Status: Divorced     Family History: The patient's family history includes Breast cancer in her mother; Cancer in her mother; Cervical cancer in her maternal grandmother.  ROS:   Please see the history of present illness.     All other systems reviewed and are  negative.  EKGs/Labs/Other Studies Reviewed:    The following studies were reviewed today: EKG Interpretation Date/Time:  Thursday July 08 2023 08:13:51 EDT Ventricular Rate:  63 PR Interval:  158 QRS Duration:  78 QT Interval:  414 QTC Calculation: 423 R Axis:   38  Text Interpretation: Normal sinus rhythm Normal ECG When compared with ECG of 16-May-2021 13:53, No significant change was found Confirmed by [place], Rasheeda Mulvehill [phone]) on 07/08/2023 8:21:40 AM   EKG Interpretation Date/Time:  Thursday July 08 2023 08:13:51 EDT Ventricular Rate:  63 PR Interval:  158 QRS Duration:  78 QT Interval:  414 QTC Calculation: 423 R  Axis:   38  Text Interpretation: Normal sinus rhythm Normal ECG When compared with ECG of 16-May-2021 13:53, No significant change was found Confirmed by [place], Temesha Queener [phone]) on 07/08/2023 8:21:40 AM    Recent Labs: 12/16/2022: ALT 9; BUN 28; Creatinine, Ser 0.79; Hemoglobin 13.8; Platelets 172.0; Potassium 5.1; Sodium 140; TSH 1.56  Recent Lipid Panel    Component Value Date/Time   CHOL 211 (H) 07/01/2020 0900   TRIG 79 07/01/2020 0900   HDL 58 07/01/2020 0900   CHOLHDL 3.6 07/01/2020 0900   CHOLHDL 4 03/07/2019 1012   VLDL 24.6 03/07/2019 1012   LDLCALC 139 (H) 07/01/2020 0900     Risk Assessment/Calculations:       Physical Exam:    VS:  Ht 5' 4 (1.626 m)   Wt 151 lb 12.8 oz (68.9 kg)   SpO2 98%   BMI 26.06 kg/m     Wt Readings from Last 3 Encounters:  07/08/23 151 lb 12.8 oz (68.9 kg)  07/01/23 149 lb 0.3 oz (67.6 kg)  04/30/23 157 lb (71.2 kg)   BP supine 162/79, pulse 61 BP sitting 145/90, pulse 73 BP standing 149/88, pulse 85  GEN:  Well nourished, well developed in no acute distress HEENT: Normal NECK: No JVD; No carotid bruits LYMPHATICS: No lymphadenopathy CARDIAC: RRR, no murmurs, rubs, gallops RESPIRATORY:  Clear to auscultation without rales, wheezing or rhonchi  ABDOMEN: Soft, non-tender, non-distended MUSCULOSKELETAL:  No  edema; No deformity  SKIN: Warm and dry NEUROLOGIC:  Alert and oriented x 3 PSYCHIATRIC:  Normal affect   ASSESSMENT:    1. Dizziness   2. Essential hypertension   3. Dysautonomia (HCC)   4. MS (multiple sclerosis) (HCC)    PLAN:    In order of problems listed above:  Dizziness/lightheadedness. Patient dose have mild orthostasis today and some tachycardia. I suspect  she has some degree of dysautonomia. I would like to check an Echo. Would allow liberal BP control. Discussed importance of prevention and early recognition. Maintain good hydration. Recommend compression hose or Spanx. Keep head of bed elevated and avoid lying flat for prolonged periods.            Medication Adjustments/Labs and Tests Ordered: Current medicines are reviewed at length with the patient today.  Concerns regarding medicines are outlined above.  Orders Placed This Encounter  Procedures   EKG 12-Lead   No orders of the defined types were placed in this encounter.   There are no Patient Instructions on file for this visit.   Signed, Jye Fariss [place], MD  07/08/2023 8:46 AM    [facility]

## 2023-07-05 ENCOUNTER — Encounter: Payer: Self-pay | Admitting: Allergy & Immunology

## 2023-07-08 ENCOUNTER — Encounter: Payer: Self-pay | Admitting: Cardiology

## 2023-07-08 ENCOUNTER — Ambulatory Visit: Attending: Cardiology | Admitting: Cardiology

## 2023-07-08 VITALS — Ht 64.0 in | Wt 151.8 lb

## 2023-07-08 DIAGNOSIS — G35 Multiple sclerosis: Secondary | ICD-10-CM

## 2023-07-08 DIAGNOSIS — R42 Dizziness and giddiness: Secondary | ICD-10-CM

## 2023-07-08 DIAGNOSIS — G901 Familial dysautonomia [Riley-Day]: Secondary | ICD-10-CM | POA: Diagnosis not present

## 2023-07-08 DIAGNOSIS — I10 Essential (primary) hypertension: Secondary | ICD-10-CM

## 2023-07-08 NOTE — Patient Instructions (Signed)
 Medication Instructions:  Continue same medications  Lab Work: None ordered  Testing/Procedures: Schedule Echo  first available  Follow-Up: At [facility], you and your health needs are our priority.  As part of our continuing mission to provide you with exceptional heart care, our providers are all part of one team.  This team includes your primary Cardiologist (physician) and Advanced Practice Providers or APPs (Physician Assistants and Nurse Practitioners) who all work together to provide you with the care you need, when you need it.  Your next appointment:   To Be Determined after test   Provider:  Dr.Jordan   We recommend signing up for the patient portal called MyChart.  Sign up information is provided on this After Visit Summary.  MyChart is used to connect with patients for Virtual Visits (Telemedicine).  Patients are able to view lab/test results, encounter notes, upcoming appointments, etc.  Non-urgent messages can be sent to your provider as well.   To learn more about what you can do with MyChart, go to [email or web address].

## 2023-07-15 ENCOUNTER — Ambulatory Visit: Admitting: Allergy & Immunology

## 2023-07-15 DIAGNOSIS — J3089 Other allergic rhinitis: Secondary | ICD-10-CM

## 2023-07-15 DIAGNOSIS — J302 Other seasonal allergic rhinitis: Secondary | ICD-10-CM | POA: Diagnosis not present

## 2023-07-15 NOTE — Progress Notes (Signed)
 FOLLOW UP  Date of Service/Encounter:  07/15/23   Assessment/Plan:   Seasonal and perennial allergic rhinitis (ragweed, grasses, indoor molds and outdoor molds) - on allergen immunotherapy with maintenance reached March 2020, but re-formulating her script to be based on her last three allergy  tests (new sensitization today is [place] and weed mix)   Left sided nasal obstruction - sees Dr. Karis today   Fully vaccinated against COVID19   Complicated past medical history, including multiple sclerosis  Subjective:   Emily Wolfe is a 68 y.o. female presenting today for follow up of  Chief Complaint  Patient presents with   Allergy  Testing    Emily Wolfe has a history of the following: Patient Active Problem List   Diagnosis Date Noted   Chronic low back pain 12/16/2022   S/P lumbar fusion 05/19/2021   Primary osteoarthritis of right hip 12/27/2020   Osteoarthritis 11/22/2020   Class 1 obesity with serious comorbidity and body mass index (BMI) of 30.0 to 30.9 in adult 01/26/2020   SBO (small bowel obstruction) (HCC) 03/21/2019   Nightmares 03/07/2019   Anxiety 12/20/2018   Cervicalgia 08/18/2018   Cough variant asthma vs UACS 01/03/2018   History of total hysterectomy, due to ovarian cancer 12/26/2017   Genital warts 11/19/2017   History of ovarian cancer 11/19/2017   Seasonal and perennial allergic rhinitis, followed by Allergist, treated with saline rinses and allergy  shots 11/02/2017   Mild intermittent asthma without complication 11/02/2017   Hyperlipidemia 06/29/2017   Essential hypertension 06/29/2017   MS (multiple sclerosis) (HCC) 06/29/2017   Scoliosis of lumbar spine 06/29/2017   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  Discussed the use  of AI scribe software for clinical note transcription with the patient and/or guardian, who gave verbal consent to proceed.  Emily Wolfe is a 67 y.o. female presenting for skin testing. She was last seen on June 26th. We decided to re-test her to see where her allergens were hanging out. Her previous script did not seem to be providing the same efficacy as it was previously. She has been off of all antihistamines 3 days in anticipation of the testing.   Otherwise, there have been no changes to her past medical history, surgical history, family history, or social history.    Review of systems otherwise negative other than that mentioned in the HPI.    Objective:   There were no vitals taken for this visit. There is no height or weight on file to calculate BMI.    Physical exam deferred since this was a skin testing appointment only.   Diagnostic studies:   Allergy  Studies:   Airborne Adult Perc  Row Name 07/15/23 1457      Test Information  Time Antigen Placed 1457      Allergen Manufacturer Jestine      Location Back      Number of Test 55      Panel 1 Select      Routine  1. Control-Buffer 50% Glycerol Negative      2. Control-Histamine 2+      Grasses  3. Bahia Negative      4. [place] Negative      5. Johnson Negative      6. [place]  Blue Negative  7. Meadow Fescue Negative      8. Perennial Rye Negative      9. Timothy Negative      Weeds  10. Ragweed Mix Negative      11. Cocklebur Negative      12. Plantain,  English Negative      13. Baccharis Negative      14. Dog Fennel Negative      15. Russian Thistle Negative      16. Lamb's Quarters Negative      17. Sheep Sorrell Negative      18. Rough Pigweed Negative      19. Marsh Elder, Rough Negative      20. Mugwort, Common Negative      Trees  21. Box, Elder Negative      22. Cedar, red Negative      23. Sweet Gum Negative      24. Pecan Pollen Negative      25. Pine Mix Negative      26. Walnut,  Black Pollen Negative      27. Red Mulberry Negative      28. Ash Mix Negative      29. Birch Mix Negative      30. Beech American Negative      31. Cottonwood, [place] Negative      32. Hickory, White Negative      33. Maple Mix Negative      34. Oak, [place] Mix Negative      35. Sycamore Eastern Negative      Major Molds Mix (seasonal) 1  36. Alternaria Alternata Negative      37. Cladosporium Herbarum Negative      Major Molds Mix (perennial ) 2  38. Aspergillus Mix Negative      39. Penicillium Mix Negative      Major Mold Mix (seasonal) 3  40. Bipolaris Sorokiniana (Helminthosporium) Negative      41. Drechslera Spicifera (Curvularia) Negative      42. Mucor Plumbeus Negative      Major Molds Mix (perennial ) 4  43. Fusarium Moniliforme Negative      44. Aureobasidium Pullulans (pullulara) Negative      45. Rhizopus Oryzae Negative      Other Molds  46. Botrytis Cinera Negative      47. Epicoccum Nigrum Negative      48. Phoma Betae Negative      Inhalants  49. Dust Mite Mix Negative      50. Cat Hair 10,000 BAU/ml Negative      51.  Dog Epithelia Negative      52. Mixed Feathers Negative      53. Horse Epithelia Negative      54. Cockroach, German Negative      55. Tobacco Leaf Negative        Intradermal  Row Name 07/15/23 1528      Test Information  Time Antigen Placed 1530      Allergen Manufacturer Greer      Location Arm      Number of Test 16      Intradermal  Control Negative      Bahia Negative      [place] 1+      Johnson Negative      7 Grass Negative      Ragweed Mix Negative      Weed Mix 1+      Tree Mix Negative      Mold 1 Negative  Mold 2 Negative      Mold 3 Negative      Mold 4 1+      Mite Mix Negative      Cat Negative      Dog Negative      Cockroach Negative           Allergy  testing results were read and interpreted by myself, documented by clinical Wolfe.      Marty Shaggy, MD  [facility] 

## 2023-07-19 ENCOUNTER — Ambulatory Visit (INDEPENDENT_AMBULATORY_CARE_PROVIDER_SITE_OTHER)

## 2023-07-19 ENCOUNTER — Encounter: Payer: Self-pay | Admitting: Allergy & Immunology

## 2023-07-19 DIAGNOSIS — J309 Allergic rhinitis, unspecified: Secondary | ICD-10-CM

## 2023-07-19 NOTE — Progress Notes (Signed)
 Aeroallergen Immunotherapy   Ordering Provider: Dr. Marty Shaggy   Patient Details  Name: Emily Wolfe  MRN: [id number]  Date of Birth: [birth year]   Order 2 of 2   Vial Label: Molds   0.2 ml (Volume)  1:10 Concentration -- Aspergillus mix  0.2 ml (Volume)  1:10 Concentration -- Penicillium mix  0.2 ml (Volume)  1:20 Concentration -- Bipolaris sorokiniana  0.2 ml (Volume)  1:20 Concentration -- Drechslera spicifera  0.2 ml (Volume)  1:10 Concentration -- Mucor plumbeus  0.2 ml (Volume)  1:10 Concentration -- Fusarium moniliforme  0.2 ml (Volume)  1:40 Concentration -- Aureobasidium pullulans  0.2 ml (Volume)  1:10 Concentration -- Rhizopus oryzae    1.6  ml Extract Subtotal  3.4  ml Diluent   5.0  ml Maintenance Total   Schedule:  B   Blue Vial (1:100,000): Schedule B (6 doses)  Yellow Vial (1:10,000): Schedule B (6 doses)  Green Vial (1:1,000): Schedule B (6 doses)  Red Vial (1:100): Schedule A (12 doses)   Special Instructions: After completion of the first Red Vial, please space to every two weeks. After completion of the second Red Vial, please space to every 4 weeks. Ok to up dose new vials at 0.30mL --> 0.3 mL --> 0.5 mL. Ok to come twice weekly, if desired, as long as there is 48 hours between injections.

## 2023-07-19 NOTE — Progress Notes (Signed)
 Aeroallergen Immunotherapy   Ordering Provider: Dr. Marty Shaggy   Patient Details  Name: Emily Wolfe  MRN: [id number]  Date of Birth: 1955-10-05   Order 1 of 2   Vial Label: G/W   0.3 ml (Volume)  BAU Concentration -- 7 Grass Mix* 100,000 ([place]  Blue, [place], Orchard, Perennial Rye, RedTop, Sweet Vernal, Timothy)  0.6 ml (Volume)  BAU Concentration -- [place] 10,000  0.6 ml (Volume)  1:20 Concentration -- Ragweed Mix  0.7 ml (Volume)  1:20 Concentration -- Weed Mix*   2.2  ml Extract Subtotal  2.8  ml Diluent   5.0  ml Maintenance Total   Schedule:  B   Blue Vial (1:100,000): Schedule B (6 doses)  Yellow Vial (1:10,000): Schedule B (6 doses)  Green Vial (1:1,000): Schedule B (6 doses)  Red Vial (1:100): Schedule A (12 doses)   Special Instructions: After completion of the first Red Vial, please space to every two weeks. After completion of the second Red Vial, please space to every 4 weeks. Ok to up dose new vials at 0.80mL --> 0.3 mL --> 0.5 mL. Ok to come twice weekly, if desired, as long as there is 48 hours between injections.

## 2023-07-19 NOTE — Progress Notes (Signed)
MAKE WHEN NEEDED. 

## 2023-07-22 DIAGNOSIS — R200 Anesthesia of skin: Secondary | ICD-10-CM | POA: Diagnosis not present

## 2023-07-22 DIAGNOSIS — G930 Cerebral cysts: Secondary | ICD-10-CM | POA: Diagnosis not present

## 2023-07-22 DIAGNOSIS — R519 Headache, unspecified: Secondary | ICD-10-CM | POA: Diagnosis not present

## 2023-07-22 DIAGNOSIS — G35 Multiple sclerosis: Secondary | ICD-10-CM | POA: Diagnosis not present

## 2023-07-22 DIAGNOSIS — G4700 Insomnia, unspecified: Secondary | ICD-10-CM | POA: Diagnosis not present

## 2023-07-30 ENCOUNTER — Ambulatory Visit

## 2023-07-30 DIAGNOSIS — J309 Allergic rhinitis, unspecified: Secondary | ICD-10-CM

## 2023-08-05 ENCOUNTER — Ambulatory Visit (INDEPENDENT_AMBULATORY_CARE_PROVIDER_SITE_OTHER)

## 2023-08-05 DIAGNOSIS — J309 Allergic rhinitis, unspecified: Secondary | ICD-10-CM

## 2023-08-11 DIAGNOSIS — R519 Headache, unspecified: Secondary | ICD-10-CM | POA: Diagnosis not present

## 2023-08-11 DIAGNOSIS — G35 Multiple sclerosis: Secondary | ICD-10-CM | POA: Diagnosis not present

## 2023-08-11 DIAGNOSIS — D84821 Immunodeficiency due to drugs: Secondary | ICD-10-CM | POA: Diagnosis not present

## 2023-08-11 DIAGNOSIS — G930 Cerebral cysts: Secondary | ICD-10-CM | POA: Diagnosis not present

## 2023-08-11 DIAGNOSIS — R42 Dizziness and giddiness: Secondary | ICD-10-CM | POA: Diagnosis not present

## 2023-08-13 ENCOUNTER — Ambulatory Visit

## 2023-08-13 DIAGNOSIS — J309 Allergic rhinitis, unspecified: Secondary | ICD-10-CM

## 2023-08-16 ENCOUNTER — Ambulatory Visit (INDEPENDENT_AMBULATORY_CARE_PROVIDER_SITE_OTHER): Payer: Medicare Other

## 2023-08-16 VITALS — Ht 64.0 in | Wt 141.0 lb

## 2023-08-16 DIAGNOSIS — Z0000 Encounter for general adult medical examination without abnormal findings: Secondary | ICD-10-CM

## 2023-08-16 NOTE — Progress Notes (Signed)
 Subjective:   Emily Wolfe is a 68 y.o. who presents for a Medicare Wellness preventive visit.  As a reminder, Annual Wellness Visits don't include a physical exam, and some assessments may be limited, especially if this visit is performed virtually. We may recommend an in-person follow-up visit with your provider if needed.  Visit Complete: Virtual I connected with  Emily Wolfe on 08/16/23 by a audio enabled telemedicine application and verified that I am speaking with the correct person using two identifiers.  Patient Location: Home  Provider Location: Office/Clinic  I discussed the limitations of evaluation and management by telemedicine. The patient expressed understanding and agreed to proceed.  Vital Signs: Because this visit was a virtual/telehealth visit, some criteria may be missing or patient reported. Any vitals not documented were not able to be obtained and vitals that have been documented are patient reported.  VideoDeclined- This patient declined Librarian, academic. Therefore the visit was completed with audio only.  Persons Participating in Visit: Patient.  AWV Questionnaire: Yes: Patient Medicare AWV questionnaire was completed by the patient on 08/13/23; I have confirmed that all information answered by patient is correct and no changes since this date.  Cardiac Risk Factors include: advanced age (>9men, >76 women);dyslipidemia;hypertension     Objective:    Today's Vitals   08/13/23 0948 08/16/23 1338  Weight:  141 lb (64 kg)  Height:  5' 4 (1.626 m)  PainSc: 5     Body mass index is 24.2 kg/m.     08/16/2023    1:43 PM 12/23/2022    4:24 PM 08/11/2022    2:27 PM 06/09/2022    9:52 AM 02/25/2022    8:01 PM 12/01/2021    2:03 PM 08/15/2021    8:05 AM  Advanced Directives  Does Patient Have a Medical Advance Directive? Yes Yes Yes Yes No Yes Yes  Type of Estate agent of [place];Living will Healthcare  Power of [place];Living will Healthcare Power of [place];Living will Healthcare Power of [employer] of [place];Living will  Does patient want to make changes to medical advance directive?  No - Patient declined  No - Patient declined  No - Patient declined   Copy of Healthcare Power of Attorney in Chart? No - copy requested No - copy requested No - copy requested    No - copy requested  Would patient like information on creating a medical advance directive?     No - Patient declined      Current Medications (verified) Outpatient Encounter Medications as of 08/16/2023  Medication Sig   azelastine  (ASTELIN ) 0.1 % nasal spray Place 1 spray into both nostrils 2 (two) times daily as needed for rhinitis. Use in each nostril as directed   EPINEPHrine  (EPIPEN  2-PAK) 0.3 mg/0.3 mL IJ SOAJ injection Inject 0.3 mg into the muscle as needed for anaphylaxis.   gabapentin  (NEURONTIN ) 300 MG capsule Take 1 capsule (300 mg total) by mouth 3 (three) times daily as needed.   leflunomide  (ARAVA ) 10 MG tablet Take 10 mg by mouth every evening.   nortriptyline (PAMELOR) 10 MG capsule    celecoxib  (CELEBREX ) 200 MG capsule TAKE 1 CAPSULE BY MOUTH 2 TIMES DAILY (Patient not taking: Reported on 08/16/2023)   Cholecalciferol (VITAMIN D3) 1.25 MG (50000 UT) CAPS Take 1 capsule (1.25 mg total) by mouth once a week. (Patient not taking: Reported on 08/16/2023)   No facility-administered encounter medications on file as of 08/16/2023.  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   History of ovarian cancer 11/19/2017   History  of stomach ulcers    Hyperlipidemia 06/29/2017   Hypertension 06/29/2017   Infection 10/2007   From hysterectomy sutures rupturing   Inguinal hernia    Joint pain    Lactose intolerance    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   Mild intermittent asthma without complication 11/02/2017   Multiple sclerosis (HCC) 06/29/2017   Neuromuscular disorder (HCC)    Osteoarthritis    Palpitations    Postmenopausal 06/29/2017   Scoliosis of lumbar spine 06/29/2017   Small bowel obstruction (HCC) 06/29/2017   Swallowing difficulty    Umbilical hernia    Urticaria    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   Vitamin D  deficiency 06/29/2017   Past Surgical History:  Procedure Laterality Date   ADENOIDECTOMY     CHOLECYSTECTOMY  2009   HERNIA REPAIR     HERNIA REPAIR     6/10, 9/10   HYSTERECTOMY ABDOMINAL WITH SALPINGO-OOPHORECTOMY     HYSTEROTOMY  2008   LAPAROSCOPIC LYSIS OF ADHESIONS     , With small bowel resection and ventral hernia repair with mesh 2019/[place]   MOHS SURGERY     SINOSCOPY     VARICOSE VEIN SURGERY     Family History  Problem Relation Age of Onset   Breast cancer Mother    Cancer Mother    Cervical cancer Maternal Grandmother    Social History   Socioeconomic History   Marital status: Divorced    Spouse name: Not on file   Number of children: Not on file   Years of education: Not on file   Highest education level: Master's degree (e.g., MA, MS, MEng, MEd, MSW, MBA)  Occupational History   Not on file  Tobacco Use   Smoking status: Never   Smokeless tobacco: Never  Vaping Use   Vaping status: Never Used  Substance and Sexual Activity   Alcohol use: Not Currently   Drug use: Not Currently   Sexual activity: Not Currently  Other Topics Concern   Not on file  Social History Narrative   The patient does wear seatbelts. The patient does use sunscreen or protective clothing regularly. She does participate in regular exercise. Her  exercise is: elliptical, approximately one time per week. She does not have a secured firearm in the home. She denies a history of intimate partner violence.   Social Drivers of Corporate investment banker Strain: Low Risk  (08/13/2023)   Overall Financial Resource Strain (CARDIA)    Difficulty of Paying Living Expenses: Not hard at all  Food Insecurity: No Food Insecurity (08/13/2023)   Hunger Vital Sign    Worried About Running Out of Food in the Last Year: Never true    Ran Out of Food in the Last Year: Never true  Transportation Needs: No Transportation Needs (08/13/2023)   PRAPARE - Administrator, Civil Service (Medical): No    Lack of Transportation (Non-Medical): No  Physical Activity: Sufficiently Active (08/13/2023)   Exercise Vital Sign    Days of Exercise per Week: 7 days    Minutes of Exercise per Session: 60 min  Stress: No Stress Concern Present (08/13/2023)   [employer] of Occupational Health - Occupational Stress Questionnaire    Feeling of Stress: Not at all  Social Connections: Socially Isolated (08/13/2023)   Social Connection and Isolation Panel    Frequency of Communication with Friends and Family: More than three times a week    Frequency of Social Gatherings with Friends and Family: Once a week    Attends Religious Services: Never    Database administrator or Organizations: No    Attends Engineer, structural: Never    Marital Status: Divorced    Tobacco Counseling Counseling given: Not Answered    Clinical Intake:  Pre-visit preparation completed: Yes  Pain : 0-10 Pain Score: 5  Pain Type: Chronic pain Pain Location: Generalized     BMI - recorded: 24.2 Nutritional Status: BMI of 19-24  Normal Diabetes: No  Lab Results  Component Value Date   HGBA1C 5.5 12/16/2022   HGBA1C 5.4 09/25/2019     How often do you need to have someone help you when you read instructions, pamphlets, or other written materials from your doctor  or pharmacy?: 1 - Never  Interpreter Needed?: No  Information entered by :: Ellouise Haws, LPN   Activities of Daily Living     08/13/2023    9:48 AM  In your present state of health, do you have any difficulty performing the following activities:  Hearing? 0  Vision? 0  Difficulty concentrating or making decisions? 0  Walking or climbing stairs? 0  Dressing or bathing? 0  Doing errands, shopping? 0  Preparing Food and eating ? N  Using the Toilet? N  In the past six months, have you accidently leaked urine? N  Do you have problems with loss of bowel control? N  Managing your Medications? N  Managing your Finances? N  Housekeeping or managing your Housekeeping? N    Patient Care Team: Kennyth Worth HERO, MD as PCP - General (Family Medicine) Betsey Raring, MD as Referring Physician (Obstetrics and Gynecology) Iva Marty Saltness, MD as Consulting Physician (Allergy  and Immunology) Luci Hart POUR, MD as Referring Physician (Gynecology) Burnetta Aures, MD as Consulting Physician (Orthopedic Surgery) Catarina Hoyle, MD as Consulting Physician (Neurology)  I have updated your Care Teams any recent Medical Services you may have received from other providers in the past year.     Assessment:   This is a routine wellness examination for [place].  Hearing/Vision screen Hearing Screening - Comments:: Pt denies any hearing issues  Vision Screening - Comments:: Wears rx glasses - encouraged to follow up with routine eye exams with [facility] ophthalmology    Goals Addressed             This Visit's Progress    Patient Stated       Maintain health as best as I can        Depression Screen     08/16/2023    1:41 PM 04/30/2023   10:25 AM 12/16/2022    7:40 AM 08/11/2022    2:22 PM 05/19/2022   10:47 AM 03/10/2022    8:41 AM 10/30/2021    1:20 PM  PHQ 2/9 Scores  PHQ - 2 Score 0 0 0 0 0 0 0    Fall Risk  08/13/2023    9:48 AM 04/30/2023   10:25 AM 12/16/2022     7:41 AM 08/10/2022    5:02 PM 05/19/2022   10:47 AM  Fall Risk   Falls in the past year? 0 0 1 1 0  Number falls in past yr:  0 1 1 0  Injury with Fall?  0 0 0 0  Risk for fall due to : Impaired balance/gait No Fall Risks No Fall Risks Impaired vision;Impaired mobility No Fall Risks  Follow up Falls prevention discussed   Falls prevention discussed     MEDICARE RISK AT HOME:  Medicare Risk at Home Any stairs in or around the home?: (Patient-Rptd) No Home free of loose throw rugs in walkways, pet beds, electrical cords, etc?: (Patient-Rptd) Yes Adequate lighting in your home to reduce risk of falls?: (Patient-Rptd) Yes Life alert?: (Patient-Rptd) No Use of a cane, walker or w/c?: (Patient-Rptd) No Grab bars in the bathroom?: (Patient-Rptd) Yes Shower chair or bench in shower?: (Patient-Rptd) No Elevated toilet seat or a handicapped toilet?: (Patient-Rptd) No  TIMED UP AND GO:  Was the test performed?  No  Cognitive Function: 6CIT completed        08/16/2023    1:43 PM 08/11/2022    2:28 PM 08/15/2021    8:09 AM  6CIT Screen  What Year? 0 points 0 points 0 points  What month? 0 points 0 points 0 points  What time? 0 points 0 points 0 points  Count back from 20 0 points 0 points 0 points  Months in reverse 0 points 0 points 0 points  Repeat phrase 0 points 0 points 0 points  Total Score 0 points 0 points 0 points    Immunizations Immunization History  Administered Date(s) Administered    sv, Bivalent, Protein Subunit Rsvpref,pf [place]) 12/05/2021   DTaP 05/20/1973   Fluad Quad(high Dose 65+) 11/22/2020, 11/25/2022   IPV 05/20/1973   Influenza, High Dose Seasonal PF 11/25/2022   PFIZER Comirnaty(Gray Top)Covid-19 Tri-Sucrose Vaccine 08/30/2020   PFIZER(Purple Top)SARS-COV-2 Vaccination 03/31/2019, 04/25/2019, 10/30/2019   Pfizer Covid-19 Vaccine Bivalent Booster 67yrs & up 03/26/2021   Pfizer(Comirnaty)Fall Seasonal Vaccine 12 years and older 11/21/2022    Pneumococcal-Unspecified 01/07/2021   Td 02/06/2007   Tdap 06/11/2020   Zoster Recombinant(Shingrix) 12/26/2019, 04/24/2020    Screening Tests Health Maintenance  Topic Date Due   Pneumococcal Vaccine: 50+ Years (1 of 2 - PCV) 08[birth year]1974   MAMMOGRAM  08/31/2022   COVID-19 Vaccine (7 - Pfizer risk 2024-25 season) 05/21/2023   INFLUENZA VACCINE  08/06/2023   Medicare Annual Wellness (AWV)  08/15/2024   Colonoscopy  12/24/2027   DTaP/Tdap/Td (4 - Td or Tdap) 06/12/2030   DEXA SCAN  Completed   Hepatitis C Screening  Completed   Zoster Vaccines- Shingrix  Completed   Hepatitis B Vaccines  Aged Out   HPV VACCINES  Aged Out   Meningococcal B Vaccine  Aged Out    Health Maintenance  Health Maintenance Due  Topic Date Due   Pneumococcal Vaccine: 50+ Years (1 of 2 - PCV) 08[birth year]17/1974   MAMMOGRAM  08/31/2022   COVID-19 Vaccine (7 - Pfizer risk 2024-25 season) 05/21/2023   INFLUENZA VACCINE  08/06/2023   Health Maintenance Items Addressed: Pt preferred to schedule [facility],   Additional Screening:  Vision Screening: Recommended annual ophthalmology exams for early detection of glaucoma and other disorders of the eye. Would you like a referral to an eye doctor? No    Dental  Screening: Recommended annual dental exams for proper oral hygiene  Community Resource Referral / Chronic Care Management: CRR required this visit?  No   CCM required this visit?  No   Plan:    I have personally reviewed and noted the following in the patient's chart:   Medical and social history Use of alcohol, tobacco or illicit drugs  Current medications and supplements including opioid prescriptions. Patient is not currently taking opioid prescriptions. Functional ability and status Nutritional status Physical activity Advanced directives List of other physicians Hospitalizations, surgeries, and ER visits in previous 12 months Vitals Screenings to include cognitive,  depression, and falls Referrals and appointments  In addition, I have reviewed and discussed with patient certain preventive protocols, quality metrics, and best practice recommendations. A written personalized care plan for preventive services as well as general preventive health recommendations were provided to patient.   Ellouise VEAR Haws, LPN   1/88/7974   After Visit Summary: (MyChart) Due to this being a telephonic visit, the after visit summary with patients personalized plan was offered to patient via MyChart   Notes: Nothing significant to report at this time.

## 2023-08-16 NOTE — Patient Instructions (Signed)
 [name] , Thank you for taking time out of your busy schedule to complete your Annual Wellness Visit with me. I enjoyed our conversation and look forward to speaking with you again next year. I, as well as your care team,  appreciate your ongoing commitment to your health goals. Please review the following plan we discussed and let me know if I can assist you in the future. Your Game plan/ To Do List    Referrals: If you haven't heard from the office you've been referred to, please reach out to them at the phone provided.   Follow up Visits: We will see or speak with you next year for your Next Medicare AWV with our clinical staff Have you seen your provider in the last 6 months (3 months if uncontrolled diabetes)? Yes  Clinician Recommendations:  Aim for 30 minutes of exercise or brisk walking, 6-8 glasses of water, and 5 servings of fruits and vegetables each day.       This is a list of the screenings recommended for you:  Health Maintenance  Topic Date Due   Pneumococcal Vaccine for age over 60 (1 of 2 - PCV) 08[birth year]17[birth year]1974   Mammogram  08/31/2022   COVID-19 Vaccine (7 - Pfizer risk 2024-25 season) 05/21/2023   Flu Shot  08/06/2023   Medicare Annual Wellness Visit  08/15/2024   Colon Cancer Screening  12/24/2027   DTaP/Tdap/Td vaccine (4 - Td or Tdap) 06/12/2030   DEXA scan (bone density measurement)  Completed   Hepatitis C Screening  Completed   Zoster (Shingles) Vaccine  Completed   Hepatitis B Vaccine  Aged Out   HPV Vaccine  Aged Out   Meningitis B Vaccine  Aged Out    Advanced directives: (Copy Requested) Please bring a copy of your health care power of attorney and living will to the office to be added to your chart at your convenience. You can mail to [facility] [address] [place], [place] [address] or email to [email or web address] Advance Care Planning is important because it:  [x]  Makes sure you receive the medical care that is consistent  with your values, goals, and preferences  [x]  It provides guidance to your family and loved ones and reduces their decisional burden about whether or not they are making the right decisions based on your wishes.  Follow the link provided in your after visit summary or read over the paperwork we have mailed to you to help you started getting your Advance Directives in place. If you need assistance in completing these, please reach out to us  so that we can help you!  See attachments for Preventive Care and Fall Prevention Tips.

## 2023-08-17 ENCOUNTER — Other Ambulatory Visit: Payer: Self-pay | Admitting: Medical Genetics

## 2023-08-24 ENCOUNTER — Ambulatory Visit (HOSPITAL_COMMUNITY)
Admission: RE | Admit: 2023-08-24 | Discharge: 2023-08-24 | Disposition: A | Discharge status: Home / Self Care | Source: Ambulatory Visit | Attending: Cardiology | Admitting: Cardiology

## 2023-08-24 ENCOUNTER — Ambulatory Visit (INDEPENDENT_AMBULATORY_CARE_PROVIDER_SITE_OTHER)

## 2023-08-24 DIAGNOSIS — G35 Multiple sclerosis: Secondary | ICD-10-CM | POA: Insufficient documentation

## 2023-08-24 DIAGNOSIS — R42 Dizziness and giddiness: Secondary | ICD-10-CM | POA: Diagnosis not present

## 2023-08-24 DIAGNOSIS — I10 Essential (primary) hypertension: Secondary | ICD-10-CM | POA: Diagnosis not present

## 2023-08-24 DIAGNOSIS — G901 Familial dysautonomia [Riley-Day]: Secondary | ICD-10-CM | POA: Insufficient documentation

## 2023-08-24 DIAGNOSIS — J309 Allergic rhinitis, unspecified: Secondary | ICD-10-CM

## 2023-08-24 LAB — ECHOCARDIOGRAM COMPLETE
Area-P 1/2: 7.22 cm2
S' Lateral: 3.1 cm

## 2023-08-25 ENCOUNTER — Ambulatory Visit: Payer: Self-pay | Admitting: Cardiology

## 2023-09-03 ENCOUNTER — Telehealth: Payer: Self-pay

## 2023-09-03 ENCOUNTER — Ambulatory Visit (INDEPENDENT_AMBULATORY_CARE_PROVIDER_SITE_OTHER)

## 2023-09-03 DIAGNOSIS — J309 Allergic rhinitis, unspecified: Secondary | ICD-10-CM

## 2023-09-03 NOTE — Telephone Encounter (Signed)
 Patient came in today for a New start of her allergy  vials. The vials have not been ordered. Please reorder the vials. The encounter from 07/20/2023 states that the vials can be made when needed but the appointment note for 09/03/2023 states that it was a ne start/made. We gave her the 0.5 of her allergy  injections and will finish the vials.

## 2023-09-03 NOTE — Progress Notes (Signed)
 VIALS MADE ON 09/07/23

## 2023-09-07 ENCOUNTER — Encounter: Payer: Self-pay | Admitting: Sports Medicine

## 2023-09-07 DIAGNOSIS — J301 Allergic rhinitis due to pollen: Secondary | ICD-10-CM | POA: Diagnosis not present

## 2023-09-08 DIAGNOSIS — J302 Other seasonal allergic rhinitis: Secondary | ICD-10-CM | POA: Diagnosis not present

## 2023-09-10 NOTE — Telephone Encounter (Signed)
 The vials were made and sent to [place] via [facility] on 09/08/23.

## 2023-09-15 ENCOUNTER — Ambulatory Visit (INDEPENDENT_AMBULATORY_CARE_PROVIDER_SITE_OTHER)

## 2023-09-15 DIAGNOSIS — J309 Allergic rhinitis, unspecified: Secondary | ICD-10-CM | POA: Diagnosis not present

## 2023-10-04 DIAGNOSIS — M4802 Spinal stenosis, cervical region: Secondary | ICD-10-CM | POA: Diagnosis not present

## 2023-10-04 DIAGNOSIS — M5134 Other intervertebral disc degeneration, thoracic region: Secondary | ICD-10-CM | POA: Diagnosis not present

## 2023-10-04 DIAGNOSIS — M50322 Other cervical disc degeneration at C5-C6 level: Secondary | ICD-10-CM | POA: Diagnosis not present

## 2023-10-04 DIAGNOSIS — M2578 Osteophyte, vertebrae: Secondary | ICD-10-CM | POA: Diagnosis not present

## 2023-10-04 DIAGNOSIS — M5030 Other cervical disc degeneration, unspecified cervical region: Secondary | ICD-10-CM | POA: Diagnosis not present

## 2023-10-04 DIAGNOSIS — M47812 Spondylosis without myelopathy or radiculopathy, cervical region: Secondary | ICD-10-CM | POA: Diagnosis not present

## 2023-10-04 DIAGNOSIS — G930 Cerebral cysts: Secondary | ICD-10-CM | POA: Diagnosis not present

## 2023-10-04 DIAGNOSIS — G35 Multiple sclerosis: Secondary | ICD-10-CM | POA: Diagnosis not present

## 2023-10-07 ENCOUNTER — Ambulatory Visit

## 2023-10-07 DIAGNOSIS — J309 Allergic rhinitis, unspecified: Secondary | ICD-10-CM

## 2023-10-15 ENCOUNTER — Ambulatory Visit (INDEPENDENT_AMBULATORY_CARE_PROVIDER_SITE_OTHER)

## 2023-10-15 DIAGNOSIS — J309 Allergic rhinitis, unspecified: Secondary | ICD-10-CM

## 2023-10-18 ENCOUNTER — Encounter: Payer: Self-pay | Admitting: Family Medicine

## 2023-10-19 NOTE — Telephone Encounter (Signed)
 Small opacities sometimes do not show up on chest x-ray.  A better test would be to get a noncontrast chest CT.  Please place order if she is agreeable.

## 2023-10-19 NOTE — Telephone Encounter (Signed)
 See note

## 2023-10-21 ENCOUNTER — Other Ambulatory Visit: Payer: Self-pay | Admitting: *Deleted

## 2023-10-21 DIAGNOSIS — R918 Other nonspecific abnormal finding of lung field: Secondary | ICD-10-CM

## 2023-10-21 NOTE — Telephone Encounter (Signed)
 See note CT chest with out contrast order

## 2023-10-22 ENCOUNTER — Ambulatory Visit (INDEPENDENT_AMBULATORY_CARE_PROVIDER_SITE_OTHER)

## 2023-10-22 DIAGNOSIS — J309 Allergic rhinitis, unspecified: Secondary | ICD-10-CM | POA: Diagnosis not present

## 2023-10-25 ENCOUNTER — Other Ambulatory Visit: Payer: Self-pay | Admitting: Family Medicine

## 2023-10-27 ENCOUNTER — Ambulatory Visit
Admission: RE | Admit: 2023-10-27 | Discharge: 2023-10-27 | Disposition: A | Source: Ambulatory Visit | Attending: Family Medicine | Admitting: Family Medicine

## 2023-10-27 DIAGNOSIS — R918 Other nonspecific abnormal finding of lung field: Secondary | ICD-10-CM

## 2023-11-04 ENCOUNTER — Other Ambulatory Visit: Payer: Self-pay | Admitting: Medical Genetics

## 2023-11-04 DIAGNOSIS — Z006 Encounter for examination for normal comparison and control in clinical research program: Secondary | ICD-10-CM

## 2023-11-10 NOTE — Telephone Encounter (Signed)
 Noted

## 2023-11-11 ENCOUNTER — Ambulatory Visit

## 2023-11-11 ENCOUNTER — Ambulatory Visit: Payer: Self-pay | Admitting: Family Medicine

## 2023-11-11 DIAGNOSIS — J3089 Other allergic rhinitis: Secondary | ICD-10-CM | POA: Diagnosis not present

## 2023-11-11 DIAGNOSIS — J309 Allergic rhinitis, unspecified: Secondary | ICD-10-CM | POA: Diagnosis not present

## 2023-11-11 DIAGNOSIS — R9389 Abnormal findings on diagnostic imaging of other specified body structures: Secondary | ICD-10-CM

## 2023-11-11 NOTE — Progress Notes (Signed)
 Her chest CT scan shows a few opacities in right upper lobe.  Radiology felt this was most consistent with inflammatory or infectious causes and recommended repeat CT scan in 3 months.  It would also be reasonable for her to see a pulmonologist for further monitoring if she would prefer to see a specialist.

## 2023-11-24 ENCOUNTER — Ambulatory Visit

## 2023-11-24 DIAGNOSIS — J309 Allergic rhinitis, unspecified: Secondary | ICD-10-CM | POA: Diagnosis not present

## 2023-11-24 DIAGNOSIS — J3089 Other allergic rhinitis: Secondary | ICD-10-CM | POA: Diagnosis not present

## 2023-11-30 ENCOUNTER — Ambulatory Visit

## 2023-11-30 DIAGNOSIS — J3089 Other allergic rhinitis: Secondary | ICD-10-CM | POA: Diagnosis not present

## 2023-11-30 DIAGNOSIS — J309 Allergic rhinitis, unspecified: Secondary | ICD-10-CM

## 2023-12-06 ENCOUNTER — Ambulatory Visit: Admitting: Pulmonary Disease

## 2023-12-06 ENCOUNTER — Encounter: Payer: Self-pay | Admitting: Pulmonary Disease

## 2023-12-06 VITALS — BP 130/80 | HR 70 | Temp 98.0°F | Ht 64.0 in | Wt 138.6 lb

## 2023-12-06 DIAGNOSIS — R053 Chronic cough: Secondary | ICD-10-CM

## 2023-12-06 DIAGNOSIS — J309 Allergic rhinitis, unspecified: Secondary | ICD-10-CM

## 2023-12-06 DIAGNOSIS — R918 Other nonspecific abnormal finding of lung field: Secondary | ICD-10-CM

## 2023-12-06 LAB — GENECONNECT MOLECULAR SCREEN: Genetic Analysis Overall Interpretation: NEGATIVE

## 2023-12-06 NOTE — Progress Notes (Signed)
 @Patient  ID: Emily Wolfe, female    DOB: June 16, 1955, 68 y.o.   MRN: [id number]  Chief Complaint  Patient presents with   Consult    Found something on recent MRI.  Has chronic cough, throat clearing.  Chronic post nasal drip.  No sob.  Exercises regular regularly.    Referring provider: Kennyth Worth HERO, MD  HPI:   68 y.o. woman with longstanding history of MS and chronic cough here for evaluation of abnormal imaging.  Most recent pulmonary note 2019 Dr. Darlean reviewed.  Prior neurology notes reviewed.  Patient diagnosed with MS in 1993.  Gets the majority of her care at [facility], in [place] where she first establish care, was diagnosed.  As part of routine scans for this she usually gets her head scanned.  She had surgical complications ruptured intestine etc. in the past who gets frequent CT abdomen pelvis.  No dedicated cross-sectional chest imaging that I can review at all.  Recently, as part of workup for back pain, she had a complete spine MRI.  I cannot review the results nor the images.  Done in [place], [place].  This revealed reportedly nodules or abnormality.  This prompted CT scan of the chest locally, 10/2024.  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  She denies any worsening symptoms of cough.  Chronic cough is stable comes and goes.  No worsening dyspnea.  No fever or chills at time of scan to suggest viral illness, pneumonia etc.  We discussed at length her findings.  Most likely inflammatory in nature.  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  Quite possibly or likely chronic in nature but no cross-sectional imaging of the chest in many years or a fall from what I can review.  Cannot prove that this is truly chronic.  Discussed role and rationale for additional CT scans in the future.  Especially given her personal history of cancer, family  history of cancer.  She expressed understanding.  She has quite a bit of competing medical priorities.  Including trying to get approved for new MS treatment.  As well as back pain, consideration of spinal surgery.  Recommend repeat CT scan at 3 to 28-month interval which she agrees to, we will plan to do it in the spring so she can get some other things taken care of.  Questionaires / Pulmonary Flowsheets:   ACT:      No data to display          MMRC:     No data to display          Epworth:      No data to display          Tests:   FENO:  Lab Results  Component Value Date   NITRICOXIDE 14 01/03/2018    PFT:     No data to display          WALK:      No data to display          Imaging: Personally reviewed and as per EMR and discussion in this note No results found.  Lab Results: Personally reviewed CBC    Component Value Date/Time   WBC 5.0 12/16/2022 0833   RBC 4.65 12/16/2022 0833   HGB 13.8 12/16/2022 0833   HGB 14.4 07/01/2020 0900   HCT 42.3 12/16/2022 0833   HCT 43.5  07/01/2020 0900   PLT 172.0 12/16/2022 0833   PLT 167 07/01/2020 0900   MCV 90.9 12/16/2022 0833   MCV 91 07/01/2020 0900   MCH 30.6 02/27/2022 0432   MCHC 32.7 12/16/2022 0833   RDW 13.5 12/16/2022 0833   RDW 12.8 07/01/2020 0900   LYMPHSABS 0.2 (L) 11/24/2020 0225   LYMPHSABS 0.4 (L) 07/01/2020 0900   MONOABS 0.5 11/24/2020 0225   EOSABS 0.0 11/24/2020 0225   EOSABS 0.1 07/01/2020 0900   BASOSABS 0.1 11/24/2020 0225   BASOSABS 0.1 07/01/2020 0900    BMET    Component Value Date/Time   NA 140 12/16/2022 0833   NA 144 07/01/2020 0900   K 5.1 12/16/2022 0833   CL 104 12/16/2022 0833   CO2 31 12/16/2022 0833   GLUCOSE 97 12/16/2022 0833   BUN 28 (H) 12/16/2022 0833   BUN 20 07/01/2020 0900   CREATININE 0.79 12/16/2022 0833   CALCIUM 9.4 12/16/2022 0833   GFRNONAA >60 02/27/2022 0432   GFRAA 83 01/11/2020 1608    BNP No results found for:  BNP  ProBNP No results found for: PROBNP  Specialty Problems       Pulmonary Problems   Lung mass   Mild intermittent asthma without complication   Seasonal and perennial allergic rhinitis, followed by Allergist, treated with saline rinses and allergy  shots   Testing showed: ragweed, grasses, indoor molds and outdoor molds.      Cough variant asthma vs UACS   Spirometry 01/03/2018  FEV1 1.8 (65%)  Ratio 74 s curvature off all rx - FENO 01/03/2018  =  14         Allergies  Allergen Reactions   Grass Pollen(K-O-R-T-Swt Vern) Itching    Immunization History  Administered Date(s) Administered    sv, Bivalent, Protein Subunit Rsvpref,pf [place]low) 12/05/2021   DTaP 05/20/1973   Fluad Quad(high Dose 65+) 11/22/2020, 11/25/2022   Fluad Trivalent(High Dose 65+) 10/29/2023   INFLUENZA, HIGH DOSE SEASONAL PF 11/25/2022   IPV 05/20/1973   PFIZER Comirnaty(Gray Top)Covid-19 Tri-Sucrose Vaccine 08/30/2020   PFIZER(Purple Top)SARS-COV-2 Vaccination 03/31/2019, 04/25/2019, 10/30/2019   Pfizer Covid-19 Vaccine Bivalent Booster 75yrs & up 03/26/2021   Pfizer(Comirnaty)Fall Seasonal Vaccine 12 years and older 11/21/2022, 11/15/2023   Pneumococcal-Unspecified 01/07/2021   Td 02/06/2007   Tdap 06/11/2020   Zoster Recombinant(Shingrix) 12/26/2019, 04/24/2020    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   History of ovarian cancer 11/19/2017   History of stomach ulcers    Hyperlipidemia 06/29/2017   Hypertension 06/29/2017   Infection 10/2007   From hysterectomy sutures rupturing   Inguinal hernia    Joint pain    Lactose intolerance    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   Mild intermittent asthma without complication 11/02/2017   Multiple sclerosis 06/29/2017   Neuromuscular disorder (HCC)    Osteoarthritis    Palpitations    Postmenopausal 06/29/2017   Scoliosis of lumbar spine 06/29/2017   Small bowel obstruction (HCC) 06/29/2017   Swallowing difficulty    Umbilical hernia    Urticaria    Varicose veins of  lower extremity 06/29/2017   Vitamin D  deficiency 06/29/2017    Tobacco History: Social History   Tobacco Use  Smoking Status Never  Smokeless Tobacco Never   Counseling given: Not Answered   Continue to not smoke  Outpatient Encounter Medications as of 12/06/2023  Medication Sig   azelastine  (ASTELIN ) 0.1 % nasal spray Place 1 spray into both nostrils 2 (two) times daily as needed for rhinitis. Use in each nostril as directed   celecoxib  (CELEBREX ) 200 MG capsule TAKE 1 CAPSULE BY MOUTH 2 TIMES DAILY   Cholecalciferol (VITAMIN D3) 1.25 MG (50000 UT) CAPS Take 1 capsule (1.25 mg total) by mouth once a week.   EPINEPHrine  (EPIPEN  2-PAK) 0.3 mg/0.3 mL IJ SOAJ injection Inject 0.3 mg into the muscle as needed for anaphylaxis.   gabapentin  (NEURONTIN ) 300 MG capsule Take 1 capsule (300 mg total) by mouth 3 (three) times daily as needed.   leflunomide  (ARAVA ) 10 MG tablet Take 10 mg by mouth every evening.   nortriptyline (PAMELOR) 10 MG capsule    No facility-administered encounter medications on file as of 12/06/2023.     Review of Systems  Review of Systems  No chest pain with exertion.  Comprehensive review of systems otherwise negative. Physical Exam  BP 130/80 (BP Location: Left Arm, Patient Position: Sitting, Cuff Size: Normal)   Pulse 70   Temp 98 F (36.7 C) (Oral)   Ht 5' 4 (1.626 m)   Wt 138 lb 9.6 oz (62.9 kg)   SpO2 97% Comment: RA  BMI 23.79 kg/m   Wt Readings from Last 5 Encounters:  12/06/23 138 lb 9.6 oz (62.9 kg)  08/16/23 141 lb (64 kg)  07/08/23 151 lb 12.8 oz (68.9 kg)  07/01/23 149 lb  0.3 oz (67.6 kg)  04/30/23 157 lb (71.2 kg)    BMI Readings from Last 5 Encounters:  12/06/23 23.79 kg/m  08/16/23 24.20 kg/m  07/08/23 26.06 kg/m  07/01/23 26.74 kg/m  04/30/23 26.95 kg/m     Physical Exam General: Sitting in chair, in no distress Eyes: EOMI Neck: No JVP Pulmonary: Clear, good air excursion, no wheeze Cardiovascular: Warm, no edema Neuro: Normal gait, no weakness   Assessment & Plan:   Lung nodules: Tree-in-bud scattered throughout right upper lobe and right middle lobe.  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  No prior imaging to compare, suspect this is chronic in nature.  No worsening cough or infectious symptoms.  She has a personal history and family history of cancer.  Repeat CT scan in 3 to 6 months recommended.  Ordered today.  Chronic cough, Rhinorrhea: Largely related to nasal congestion it sounds like.  Anatomical issues.  Continue current remedies, needs to follow-up with ENT, competing medical priorities, on the back burner for now but she agrees in the future once ready to reengage.   Return in about 4 months (around 04/05/2024) for f/u Dr. Annella.   Donnice JONELLE Annella, MD 12/06/2023   I spent 48 minutes in the care of the patient.  Face-to-face visit, review of records, coordination of care.

## 2023-12-09 ENCOUNTER — Ambulatory Visit: Admitting: *Deleted

## 2023-12-09 DIAGNOSIS — J309 Allergic rhinitis, unspecified: Secondary | ICD-10-CM

## 2023-12-09 DIAGNOSIS — J302 Other seasonal allergic rhinitis: Secondary | ICD-10-CM

## 2023-12-23 ENCOUNTER — Ambulatory Visit

## 2023-12-23 DIAGNOSIS — J309 Allergic rhinitis, unspecified: Secondary | ICD-10-CM

## 2023-12-23 DIAGNOSIS — J302 Other seasonal allergic rhinitis: Secondary | ICD-10-CM | POA: Diagnosis not present

## 2024-01-04 ENCOUNTER — Ambulatory Visit (INDEPENDENT_AMBULATORY_CARE_PROVIDER_SITE_OTHER)

## 2024-01-04 DIAGNOSIS — J309 Allergic rhinitis, unspecified: Secondary | ICD-10-CM

## 2024-01-04 DIAGNOSIS — J302 Other seasonal allergic rhinitis: Secondary | ICD-10-CM | POA: Diagnosis not present

## 2024-01-13 ENCOUNTER — Ambulatory Visit (INDEPENDENT_AMBULATORY_CARE_PROVIDER_SITE_OTHER)

## 2024-01-13 DIAGNOSIS — J302 Other seasonal allergic rhinitis: Secondary | ICD-10-CM | POA: Diagnosis not present

## 2024-01-19 ENCOUNTER — Ambulatory Visit (INDEPENDENT_AMBULATORY_CARE_PROVIDER_SITE_OTHER)

## 2024-01-19 DIAGNOSIS — J302 Other seasonal allergic rhinitis: Secondary | ICD-10-CM | POA: Diagnosis not present

## 2024-02-03 ENCOUNTER — Ambulatory Visit

## 2024-02-03 DIAGNOSIS — J302 Other seasonal allergic rhinitis: Secondary | ICD-10-CM

## 2024-03-07 ENCOUNTER — Other Ambulatory Visit

## 2024-03-17 ENCOUNTER — Ambulatory Visit: Admitting: Pulmonary Disease

## 2024-08-17 ENCOUNTER — Ambulatory Visit
